# Patient Record
Sex: Male | Born: 1935
Health system: Southern US, Community
[De-identification: ages and names within clinical notes are randomized; demographics above are authoritative.]

## PROBLEM LIST (undated history)

## (undated) DIAGNOSIS — K635 Polyp of colon: Secondary | ICD-10-CM

## (undated) DIAGNOSIS — J189 Pneumonia, unspecified organism: Secondary | ICD-10-CM

## (undated) DIAGNOSIS — I2510 Atherosclerotic heart disease of native coronary artery without angina pectoris: Secondary | ICD-10-CM

## (undated) DIAGNOSIS — C44509 Unspecified malignant neoplasm of skin of other part of trunk: Secondary | ICD-10-CM

## (undated) DIAGNOSIS — I2584 Coronary atherosclerosis due to calcified coronary lesion: Secondary | ICD-10-CM

## (undated) DIAGNOSIS — I4891 Unspecified atrial fibrillation: Secondary | ICD-10-CM

## (undated) DIAGNOSIS — M1990 Unspecified osteoarthritis, unspecified site: Secondary | ICD-10-CM

## (undated) DIAGNOSIS — I6529 Occlusion and stenosis of unspecified carotid artery: Secondary | ICD-10-CM

## (undated) DIAGNOSIS — E785 Hyperlipidemia, unspecified: Secondary | ICD-10-CM

## (undated) HISTORY — PX: COLONOSCOPY: SHX174

## (undated) HISTORY — PX: MOLE REMOVAL: SHX2046

## (undated) HISTORY — PX: PILONIDAL CYST DRAINAGE: SHX743

## (undated) HISTORY — DX: Coronary atherosclerosis due to calcified coronary lesion: I25.84

## (undated) HISTORY — DX: Occlusion and stenosis of unspecified carotid artery: I65.29

## (undated) HISTORY — PX: SHOULDER OPEN ROTATOR CUFF REPAIR: SHX2407

## (undated) HISTORY — PX: SKIN CANCER EXCISION: SHX779

## (undated) HISTORY — DX: Atherosclerotic heart disease of native coronary artery without angina pectoris: I25.10

## (undated) HISTORY — DX: Unspecified atrial fibrillation: I48.91

## (undated) HISTORY — DX: Hyperlipidemia, unspecified: E78.5

## (undated) HISTORY — PX: TONSILLECTOMY: SUR1361

## (undated) HISTORY — DX: Polyp of colon: K63.5

## (undated) HISTORY — PX: INGUINAL HERNIA REPAIR: SUR1180

---

## 1998-04-14 ENCOUNTER — Ambulatory Visit (HOSPITAL_COMMUNITY): Admission: RE | Admit: 1998-04-14 | Discharge: 1998-04-14 | Payer: Self-pay | Admitting: Internal Medicine

## 1999-05-29 ENCOUNTER — Encounter: Payer: Self-pay | Admitting: Cardiology

## 1999-05-29 ENCOUNTER — Ambulatory Visit (HOSPITAL_COMMUNITY): Admission: RE | Admit: 1999-05-29 | Discharge: 1999-05-29 | Payer: Self-pay | Admitting: Cardiology

## 1999-06-09 ENCOUNTER — Ambulatory Visit (HOSPITAL_COMMUNITY): Admission: RE | Admit: 1999-06-09 | Discharge: 1999-06-09 | Payer: Self-pay | Admitting: Cardiology

## 1999-06-09 HISTORY — PX: CARDIAC CATHETERIZATION: SHX172

## 2000-03-25 ENCOUNTER — Encounter: Admission: RE | Admit: 2000-03-25 | Discharge: 2000-03-25 | Payer: Self-pay | Admitting: Orthopedic Surgery

## 2000-03-25 ENCOUNTER — Encounter: Payer: Self-pay | Admitting: Orthopedic Surgery

## 2001-07-04 ENCOUNTER — Encounter: Payer: Self-pay | Admitting: Emergency Medicine

## 2001-07-04 ENCOUNTER — Emergency Department (HOSPITAL_COMMUNITY): Admission: EM | Admit: 2001-07-04 | Discharge: 2001-07-04 | Payer: Self-pay | Admitting: Emergency Medicine

## 2003-08-07 ENCOUNTER — Encounter: Payer: Self-pay | Admitting: Orthopedic Surgery

## 2003-08-07 ENCOUNTER — Observation Stay (HOSPITAL_COMMUNITY): Admission: EM | Admit: 2003-08-07 | Discharge: 2003-08-08 | Payer: Self-pay | Admitting: Emergency Medicine

## 2005-09-24 ENCOUNTER — Ambulatory Visit: Payer: Self-pay | Admitting: Internal Medicine

## 2005-10-01 ENCOUNTER — Ambulatory Visit: Payer: Self-pay | Admitting: Internal Medicine

## 2006-03-22 ENCOUNTER — Ambulatory Visit: Payer: Self-pay | Admitting: Internal Medicine

## 2007-05-26 ENCOUNTER — Ambulatory Visit: Payer: Self-pay | Admitting: Internal Medicine

## 2007-05-26 LAB — CONVERTED CEMR LAB
ALT: 21 units/L (ref 0–40)
AST: 23 units/L (ref 0–37)
Albumin: 3.9 g/dL (ref 3.5–5.2)
Alkaline Phosphatase: 50 units/L (ref 39–117)
BUN: 12 mg/dL (ref 6–23)
Basophils Absolute: 0 10*3/uL (ref 0.0–0.1)
Basophils Relative: 0.3 % (ref 0.0–1.0)
Bilirubin, Direct: 0.1 mg/dL (ref 0.0–0.3)
CO2: 32 meq/L (ref 19–32)
Calcium: 8.9 mg/dL (ref 8.4–10.5)
Chloride: 111 meq/L (ref 96–112)
Cholesterol: 229 mg/dL (ref 0–200)
Creatinine, Ser: 0.9 mg/dL (ref 0.4–1.5)
Direct LDL: 156.9 mg/dL
Eosinophils Absolute: 0.2 10*3/uL (ref 0.0–0.6)
Eosinophils Relative: 2.7 % (ref 0.0–5.0)
GFR calc Af Amer: 106 mL/min
GFR calc non Af Amer: 87 mL/min
Glucose, Bld: 103 mg/dL — ABNORMAL HIGH (ref 70–99)
HCT: 44.9 % (ref 39.0–52.0)
HDL: 37.1 mg/dL — ABNORMAL LOW (ref >39.0)
Hemoglobin: 14.9 g/dL (ref 13.0–17.0)
Lymphocytes Relative: 33.1 % (ref 12.0–46.0)
MCHC: 33.2 g/dL (ref 30.0–36.0)
MCV: 92.7 fL (ref 78.0–100.0)
Monocytes Absolute: 0.5 10*3/uL (ref 0.2–0.7)
Monocytes Relative: 8.3 % (ref 3.0–11.0)
Neutro Abs: 3.6 10*3/uL (ref 1.4–7.7)
Neutrophils Relative %: 55.6 % (ref 43.0–77.0)
PSA: 0.34 ng/mL (ref 0.10–4.00)
Platelets: 218 10*3/uL (ref 150–400)
Potassium: 4.2 meq/L (ref 3.5–5.1)
RBC: 4.84 M/uL (ref 4.22–5.81)
RDW: 13.2 % (ref 11.5–14.6)
Sodium: 144 meq/L (ref 135–145)
TSH: 1.5 microintl units/mL (ref 0.35–5.50)
Total Bilirubin: 0.9 mg/dL (ref 0.3–1.2)
Total CHOL/HDL Ratio: 6.2
Total Protein: 7.4 g/dL (ref 6.0–8.3)
Triglycerides: 116 mg/dL (ref 0–149)
VLDL: 23 mg/dL (ref 0–40)
WBC: 6.4 10*3/uL (ref 4.5–10.5)

## 2007-06-02 ENCOUNTER — Ambulatory Visit: Payer: Self-pay | Admitting: Internal Medicine

## 2007-06-03 DIAGNOSIS — Z860100 Personal history of colon polyps, unspecified: Secondary | ICD-10-CM | POA: Insufficient documentation

## 2007-10-24 ENCOUNTER — Telehealth: Payer: Self-pay | Admitting: Internal Medicine

## 2007-10-29 ENCOUNTER — Ambulatory Visit: Payer: Self-pay | Admitting: Internal Medicine

## 2007-11-05 ENCOUNTER — Telehealth: Payer: Self-pay | Admitting: *Deleted

## 2007-11-11 ENCOUNTER — Telehealth: Payer: Self-pay | Admitting: Internal Medicine

## 2007-11-11 LAB — CONVERTED CEMR LAB
Cholesterol: 240 mg/dL (ref 0–200)
Direct LDL: 180.5 mg/dL
HDL: 36.4 mg/dL — ABNORMAL LOW (ref >39.0)
Total CHOL/HDL Ratio: 6.6
Triglycerides: 75 mg/dL (ref 0–149)
VLDL: 15 mg/dL (ref 0–40)

## 2008-08-05 ENCOUNTER — Ambulatory Visit: Payer: Self-pay | Admitting: Internal Medicine

## 2008-08-05 LAB — CONVERTED CEMR LAB
ALT: 18 units/L (ref 0–53)
AST: 24 units/L (ref 0–37)
Albumin: 3.9 g/dL (ref 3.5–5.2)
Alkaline Phosphatase: 51 units/L (ref 39–117)
BUN: 13 mg/dL (ref 6–23)
Basophils Absolute: 0 10*3/uL (ref 0.0–0.1)
Basophils Relative: 0.2 % (ref 0.0–3.0)
Bilirubin Urine: NEGATIVE
Bilirubin, Direct: 0.1 mg/dL (ref 0.0–0.3)
Blood in Urine, dipstick: NEGATIVE
CO2: 30 meq/L (ref 19–32)
Calcium: 9.3 mg/dL (ref 8.4–10.5)
Chloride: 106 meq/L (ref 96–112)
Cholesterol: 178 mg/dL (ref 0–200)
Creatinine, Ser: 1 mg/dL (ref 0.4–1.5)
Eosinophils Absolute: 0.2 10*3/uL (ref 0.0–0.7)
Eosinophils Relative: 3.1 % (ref 0.0–5.0)
GFR calc Af Amer: 93 mL/min
GFR calc non Af Amer: 77 mL/min
Glucose, Bld: 104 mg/dL — ABNORMAL HIGH (ref 70–99)
Glucose, Urine, Semiquant: NEGATIVE
HCT: 44.1 % (ref 39.0–52.0)
HDL: 34.9 mg/dL — ABNORMAL LOW (ref >39.0)
Hemoglobin: 15.3 g/dL (ref 13.0–17.0)
Ketones, urine, test strip: NEGATIVE
LDL Cholesterol: 129 mg/dL — ABNORMAL HIGH (ref 0–99)
Lymphocytes Relative: 30.5 % (ref 12.0–46.0)
MCHC: 34.7 g/dL (ref 30.0–36.0)
MCV: 92.5 fL (ref 78.0–100.0)
Monocytes Absolute: 0.5 10*3/uL (ref 0.1–1.0)
Monocytes Relative: 9.9 % (ref 3.0–12.0)
Neutro Abs: 3.1 10*3/uL (ref 1.4–7.7)
Neutrophils Relative %: 56.3 % (ref 43.0–77.0)
Nitrite: NEGATIVE
PSA: 0.4 ng/mL (ref 0.10–4.00)
Platelets: 203 10*3/uL (ref 150–400)
Potassium: 4.8 meq/L (ref 3.5–5.1)
Protein, U semiquant: NEGATIVE
RBC: 4.77 M/uL (ref 4.22–5.81)
RDW: 12.5 % (ref 11.5–14.6)
Sodium: 142 meq/L (ref 135–145)
Specific Gravity, Urine: 1.015
TSH: 1.25 microintl units/mL (ref 0.35–5.50)
Total Bilirubin: 1 mg/dL (ref 0.3–1.2)
Total CHOL/HDL Ratio: 5.1
Total Protein: 7.5 g/dL (ref 6.0–8.3)
Triglycerides: 73 mg/dL (ref 0–149)
Urobilinogen, UA: 0.2
VLDL: 15 mg/dL (ref 0–40)
WBC Urine, dipstick: NEGATIVE
WBC: 5.4 10*3/uL (ref 4.5–10.5)
pH: 6.5

## 2008-08-12 ENCOUNTER — Ambulatory Visit: Payer: Self-pay | Admitting: Internal Medicine

## 2008-08-12 DIAGNOSIS — E785 Hyperlipidemia, unspecified: Secondary | ICD-10-CM | POA: Insufficient documentation

## 2008-10-04 ENCOUNTER — Telehealth (INDEPENDENT_AMBULATORY_CARE_PROVIDER_SITE_OTHER): Payer: Self-pay | Admitting: *Deleted

## 2008-10-21 ENCOUNTER — Ambulatory Visit: Payer: Self-pay | Admitting: Internal Medicine

## 2008-10-21 LAB — CONVERTED CEMR LAB: Rapid Strep: NEGATIVE

## 2008-12-10 ENCOUNTER — Telehealth: Payer: Self-pay | Admitting: Internal Medicine

## 2008-12-17 ENCOUNTER — Encounter: Payer: Self-pay | Admitting: Internal Medicine

## 2008-12-17 ENCOUNTER — Ambulatory Visit: Payer: Self-pay

## 2009-08-18 ENCOUNTER — Encounter (INDEPENDENT_AMBULATORY_CARE_PROVIDER_SITE_OTHER): Payer: Self-pay | Admitting: *Deleted

## 2009-10-03 ENCOUNTER — Telehealth: Payer: Self-pay | Admitting: Internal Medicine

## 2009-10-05 ENCOUNTER — Ambulatory Visit: Payer: Self-pay | Admitting: Internal Medicine

## 2009-10-05 LAB — CONVERTED CEMR LAB
ALT: 19 units/L (ref 0–53)
Basophils Relative: 0.5 % (ref 0.0–3.0)
CRP, High Sensitivity: 10.3 — ABNORMAL HIGH (ref 0.00–5.00)
Chloride: 103 meq/L (ref 96–112)
Eosinophils Relative: 3.1 % (ref 0.0–5.0)
Glucose, Urine, Semiquant: NEGATIVE
HCT: 43.8 % (ref 39.0–52.0)
Hemoglobin: 15.1 g/dL (ref 13.0–17.0)
Lymphs Abs: 1.3 10*3/uL (ref 0.7–4.0)
MCV: 95.2 fL (ref 78.0–100.0)
Monocytes Absolute: 0.4 10*3/uL (ref 0.1–1.0)
Potassium: 4.9 meq/L (ref 3.5–5.1)
Protein, U semiquant: NEGATIVE
RBC: 4.6 M/uL (ref 4.22–5.81)
Sodium: 140 meq/L (ref 135–145)
TSH: 1.23 microintl units/mL (ref 0.35–5.50)
Total CHOL/HDL Ratio: 5
Total Protein: 7.2 g/dL (ref 6.0–8.3)
Urobilinogen, UA: 0.2
VLDL: 13.8 mg/dL (ref 0.0–40.0)
WBC Urine, dipstick: NEGATIVE
WBC: 5.1 10*3/uL (ref 4.5–10.5)
pH: 6.5

## 2009-10-31 ENCOUNTER — Ambulatory Visit: Payer: Self-pay | Admitting: Internal Medicine

## 2009-11-18 ENCOUNTER — Encounter: Payer: Self-pay | Admitting: Internal Medicine

## 2009-12-26 ENCOUNTER — Ambulatory Visit: Payer: Self-pay

## 2009-12-26 ENCOUNTER — Encounter: Payer: Self-pay | Admitting: Internal Medicine

## 2009-12-26 ENCOUNTER — Encounter: Payer: Self-pay | Admitting: Cardiovascular Disease

## 2009-12-26 DIAGNOSIS — I739 Peripheral vascular disease, unspecified: Secondary | ICD-10-CM

## 2009-12-26 DIAGNOSIS — I779 Disorder of arteries and arterioles, unspecified: Secondary | ICD-10-CM | POA: Insufficient documentation

## 2010-03-27 ENCOUNTER — Encounter: Payer: Self-pay | Admitting: Internal Medicine

## 2010-05-03 ENCOUNTER — Ambulatory Visit: Payer: Self-pay | Admitting: Internal Medicine

## 2010-05-12 ENCOUNTER — Encounter: Payer: Self-pay | Admitting: Internal Medicine

## 2010-09-05 ENCOUNTER — Telehealth: Payer: Self-pay | Admitting: Internal Medicine

## 2010-09-08 ENCOUNTER — Encounter: Payer: Self-pay | Admitting: Internal Medicine

## 2010-10-30 ENCOUNTER — Ambulatory Visit: Payer: Self-pay | Admitting: Internal Medicine

## 2010-10-30 LAB — CONVERTED CEMR LAB
Albumin: 4.1 g/dL (ref 3.5–5.2)
BUN: 14 mg/dL (ref 6–23)
Basophils Relative: 0.3 % (ref 0.0–3.0)
Calcium: 9.4 mg/dL (ref 8.4–10.5)
Creatinine, Ser: 0.9 mg/dL (ref 0.4–1.5)
Direct LDL: 133.9 mg/dL
Eosinophils Absolute: 0.3 10*3/uL (ref 0.0–0.7)
GFR calc non Af Amer: 87.67 mL/min (ref >60)
Glucose, Urine, Semiquant: NEGATIVE
HDL: 47.1 mg/dL (ref >39.00)
Hemoglobin: 14.7 g/dL (ref 13.0–17.0)
Ketones, urine, test strip: NEGATIVE
Lymphs Abs: 2.1 10*3/uL (ref 0.7–4.0)
MCHC: 34.3 g/dL (ref 30.0–36.0)
MCV: 94.3 fL (ref 78.0–100.0)
Monocytes Absolute: 0.6 10*3/uL (ref 0.1–1.0)
Neutro Abs: 3.1 10*3/uL (ref 1.4–7.7)
PSA: 0.47 ng/mL (ref 0.10–4.00)
Potassium: 4.5 meq/L (ref 3.5–5.1)
Protein, U semiquant: NEGATIVE
RBC: 4.54 M/uL (ref 4.22–5.81)
TSH: 1.42 microintl units/mL (ref 0.35–5.50)
Total Protein: 7.2 g/dL (ref 6.0–8.3)
Triglycerides: 86 mg/dL (ref 0.0–149.0)
VLDL: 17.2 mg/dL (ref 0.0–40.0)
WBC Urine, dipstick: NEGATIVE
pH: 7

## 2010-11-03 ENCOUNTER — Ambulatory Visit: Payer: Self-pay | Admitting: Internal Medicine

## 2010-11-03 DIAGNOSIS — I2510 Atherosclerotic heart disease of native coronary artery without angina pectoris: Secondary | ICD-10-CM | POA: Insufficient documentation

## 2011-01-01 ENCOUNTER — Ambulatory Visit: Admit: 2011-01-01 | Payer: Self-pay

## 2011-01-02 NOTE — Progress Notes (Signed)
Summary: Pt is req that Dr Cato Mulligan sch pt an appt with ear spec on Friday  Phone Note Call from Patient Call back at [phone] cell   Caller: Patient Summary of Call: Pt called and is out of town. Pt says that he is having problems with his ear and is req that Dr. Cato Mulligan refers pt to a spec re: pts ear. Pt is req that Dr Cato Mulligan sch the pt an appt to see ear spec on Friday 09/08/10. Pt will be back in town on thursday night 09/07/10, so anytime on Friday is fine.   Initial call taken by: Lucy Antigua,  September 05, 2010 9:53 AM  Follow-up for Phone Call        ok Follow-up by: Birdie Sons MD,  September 05, 2010 3:06 PM  New Problems: EAR PAIN (ICD-388.70)   New Problems: EAR PAIN (ICD-388.70)

## 2011-01-02 NOTE — Miscellaneous (Signed)
Summary: Orders Update  Clinical Lists Changes  Problems: Added new problem of CAROTID ARTERY DISEASE (ICD-433.10) Orders: Added new Test order of Carotid Duplex (Carotid Duplex) - Signed 

## 2011-01-02 NOTE — Letter (Signed)
Summary: [facility]   [facility]   Imported By: Maryln Gottron 05/29/2010 15:01:51  _____________________________________________________________________  External Attachment:    Type:   Image     Comment:   External Document

## 2011-01-02 NOTE — Miscellaneous (Signed)
Summary: Orders Update  Clinical Lists Changes  Problems: Added new problem of ABDOMINAL BRUIT (ICD-785.9) Orders: Added new Test order of Abdominal Aorta Duplex (Abd Aorta Duplex) - Signed 

## 2011-01-02 NOTE — Assessment & Plan Note (Signed)
Summary: ?SINUS INF/BACTERIA/HEADACHE/CJR   Vital Signs:  Patient profile:   77 year old male Temp:     98.7 degrees F oral Pulse rate:   58 / minute Pulse rhythm:   regular Resp:     12 per minute BP sitting:   120 / 68  (left arm) Cuff size:   regular  Vitals Entered By: Gladis Riffle, RN (May 03, 2010 11:15 AM) CC: c/o nasal congestion and left sided headache when at beach last week, has resolved Is Patient Diabetic? No   CC:  c/o nasal congestion and left sided headache when at beach last week and has resolved.  History of Present Illness: Left sided sinus sxs started 5 days ago with left sided nasal pain developed a left sided headache no nasal congestion no fever or chills He is feeling much better the past 2 days. headache---relieved with aspirin.   All other systems reviewed and were negative   Preventive Screening-Counseling & Management  Alcohol-Tobacco     Smoking Status: quit > 6 months     Year Started: 1954     Year Quit: 1974  Current Problems (verified): 1)  Carotid Artery Disease  (ICD-433.10) 2)  Abdominal Bruit  (ICD-785.9) 3)  Chest Pain  (ICD-786.50) 4)  Preventive Health Care  (ICD-V70.0) 5)  Hyperlipidemia  (ICD-272.4) 6)  Coronary Artery Disease  (ICD-414.00) 7)  Colonic Polyps, Hx of  (ICD-V12.72)  Current Medications (verified): 1)  Pravachol 40 Mg Tabs (Pravastatin Sodium) .... At Bedtime As Directed 2)  Multivitamins  Tabs (Multiple Vitamin) .... Once Daily 3)  Fish Oil 1000 Mg Caps (Omega-3 Fatty Acids) .... Once Daily 4)  Co Q-10 100 Mg Caps (Coenzyme Q10) .... 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   Head:  normocephalic and atraumatic.   Eyes:  pupils equal, pupils round, no optic disk abnormalities, and no retinal abnormalitiies.   Nose:  External nasal examination shows no deformity or inflammation. Nasal mucosa are pink and moist without lesions or exudates. Neck:  No deformities, masses, or tenderness noted. Chest Wall:  No deformities, masses, tenderness or gynecomastia noted. Lungs:  Normal respiratory effort, chest expands symmetrically. Lungs are clear to auscultation, no crackles or wheezes. Heart:  Normal rate and regular rhythm. S1 and S2 normal without gallop, murmur, click, rub or other extra sounds.   Impression & Recommendations:  Problem # 1:  URI (ICD-465.9) I think sxs resolving no evidence of bacterial infection. call for any concerns, increased sxs, fever, persistence of sxs, wheeze, SOB.   no Rx  Complete Medication List: 1)  Pravachol 40 Mg Tabs (Pravastatin sodium) .... At bedtime as directed 2)  Multivitamins Tabs (Multiple vitamin) .... Once daily 3)  Fish Oil 1000 Mg Caps (Omega-3 fatty acids) .... Once daily 4)  Co Q-10 100 Mg Caps (Coenzyme q10) .... Once daily

## 2011-01-02 NOTE — Assessment & Plan Note (Signed)
Summary: cpx/pt stated he will pay for cpx/njr   Vital Signs:  Patient profile:   77 year old male Height:      70.5 inches Weight:      192.5 pounds BMI:     27.33 Temp:     97.9 degrees F oral Pulse rate:   76 / minute Pulse rhythm:   regular BP sitting:   132 / 80  (left arm) Cuff size:   large  Vitals Entered By: Alfred Levins, CMA (November 03, 2010 9:07 AM)  Nutrition Counseling: Patient's BMI is greater than 25 and therefore counseled on weight management options. CC: cpx   CC:  cpx.  History of Present Illness: Here for Medicare AWV:  1.   Risk factors based on Past M, S, F history: 2.   Physical Activities:  3.   Depression/mood:  4.   Hearing:  5.   ADL's:  6.   Fall Risk:  7.   Home Safety:  8.   Height, weight, &visual acuity: 9.   Counseling:  10.   Labs ordered based on risk factors:  11.           Referral Coordination 12.           Care Plan 13.            Cognitive Assessment   Current Problems:  CAROTID ARTERY DISEASE (ICD-433.10)--no sxs HYPERLIPIDEMIA (ICD-272.4)---no sxs on meds CORONARY ARTERY DISEASE (ICD-414.00)--no CP, SOB, PND  All other systems reviewed and were negative     Current Problems (verified): 1)  Carotid Artery Disease  (ICD-433.10) 2)  Chest Pain  (ICD-786.50) 3)  Preventive Health Care  (ICD-V70.0) 4)  Hyperlipidemia  (ICD-272.4) 5)  Coronary Artery Disease  (ICD-414.00) 6)  Colonic Polyps, Hx of  (ICD-V12.72)  Current Medications (verified): 1)  Pravachol 40 Mg Tabs (Pravastatin Sodium) .... At Bedtime As Directed 2)  Multivitamins  Tabs (Multiple Vitamin) .... Once Daily 3)  Fish Oil 1000 Mg Caps (Omega-3 Fatty Acids) .... Once Daily 4)  Co Q-10 100 Mg Caps (Coenzyme Q10) .... Once Daily 5)  Magnesium 500 Mg Tabs (Magnesium) .... Qd 6)  Vitamin D 2000 Unit Tabs (Cholecalciferol) .... 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 He looks markedly younger than his stated age. HEENT exam atraumatic, normocephalic, neck supple. Chest clear auscultation. Cardiac exam S1-S2 are regular abdominal exam active bowel sounds, soft and nontender. Extremities no clubbing cyanosis or edema. Neurologic exam is alert and oriented without any motor or sensory deficits.   Impression & Recommendations:  Problem # 1:  PREVENTIVE HEALTH CARE (ICD-V70.0)  health maint utd  Orders: Medicare -1st Annual Wellness Visit [phone])  Problem # 2:  CAROTID ARTERY DISEASE (ICD-433.10) followed by Dr. Clarene Duke  Problem # 3:  CORONARY ARTERY DISEASE (ICD-414.00) no symptoms. I discussed with him that he is not at goal lipid management. He refuses take other medications. He is doing remarkably well. He exercises frequent.  Problem # 4:  HYPERLIPIDEMIA (ICD-272.4) continue current medications. His updated medication  list for this problem includes:    Pravachol 40 Mg Tabs (Pravastatin sodium) .Marland Kitchen... At bedtime as directed  Complete Medication List: 1)  Pravachol 40 Mg Tabs (Pravastatin sodium) .... At bedtime as directed 2)  Multivitamins Tabs (Multiple vitamin) .... Once daily 3)  Fish Oil 1000 Mg Caps (Omega-3 fatty acids) .... Once daily 4)  Co Q-10 100 Mg Caps (Coenzyme q10) .... Once daily 5)  Magnesium 500 Mg Tabs (Magnesium) .... Qd 6)  Vitamin D 2000 Unit Tabs (Cholecalciferol) .... Once daily  Contraindications/Deferment of Procedures/Staging:     Test/Procedure: FLU VAX    Reason for deferment: patient declined     Test/Procedure: Zoster vaccine    Reason for deferment: declined    Orders Added: 1)  Medicare -1st Annual Wellness Visit [G0438] 2)  Est. Patient Level III [75643]

## 2011-01-05 NOTE — Consult Note (Signed)
Summary: ENT-Dr. Narda Bonds  ENT-Dr. Narda Bonds   Imported By: Maryln Gottron 10/02/2010 11:09:10  _____________________________________________________________________  External Attachment:    Type:   Image     Comment:   External Document

## 2011-01-08 ENCOUNTER — Encounter (INDEPENDENT_AMBULATORY_CARE_PROVIDER_SITE_OTHER): Payer: Medicare Other

## 2011-01-08 ENCOUNTER — Encounter: Payer: Self-pay | Admitting: Internal Medicine

## 2011-01-08 DIAGNOSIS — I6529 Occlusion and stenosis of unspecified carotid artery: Secondary | ICD-10-CM

## 2011-01-18 NOTE — Miscellaneous (Signed)
Summary: Orders Update  Clinical Lists Changes  Orders: Added new Test order of Carotid Duplex (Carotid Duplex) - Signed 

## 2011-04-03 HISTORY — PX: NM MYOCAR PERF WALL MOTION: HXRAD629

## 2011-04-19 ENCOUNTER — Other Ambulatory Visit: Payer: Self-pay | Admitting: Orthopedic Surgery

## 2011-04-19 DIAGNOSIS — M25512 Pain in left shoulder: Secondary | ICD-10-CM

## 2011-04-20 NOTE — Op Note (Signed)
   NAME:  Sean Miranda, Sean Miranda                         ACCOUNT NO.:  [id number]   MEDICAL RECORD NO.:  [id number]                   PATIENT TYPE:  OBV   LOCATION:  2550                                 FACILITY:  [facility]   PHYSICIAN:  Mila Homer. Sherlean Foot, M.D.              DATE OF BIRTH:  25-May-1930   DATE OF PROCEDURE:  09/06/2003  DATE OF DISCHARGE:  08/08/2003                                 OPERATIVE REPORT   PREOPERATIVE DIAGNOSIS:  Right ankle laceration with Achilles tendon  laceration.   POSTOPERATIVE DIAGNOSIS:  Right ankle laceration with Achilles tendon  laceration.   PROCEDURE:  Right ankle Achilles tendon repair and irrigation and  debridement.   INDICATIONS FOR PROCEDURE:  The patient is a 77 year old white male who  slipped going  down a hill. He hit his ankle on a sharp piece of metal. He  had a relatively clean laceration with an obvious complete Achilles rupture.  Informed consent was obtained.   ANESTHESIA:  General.   SURGEON:  Mila Homer. Sherlean Foot, M.D.   ASSISTANT:  None.   DESCRIPTION OF PROCEDURE:  The patient was taken to the operating room and  laid in the supine position and administered general anesthesia. The right  lower extremity was prepped and draped in the usual sterile fashion. We then  placed him in a figure-of-4 position to get access to the posterior aspect  of the ankle.   I then extended the incision on the lateral side going proximal and the  medial side going distal approximately 3 cm each, creating nice flaps to  expose the cut surface of the Achilles tendon. I then irrigated with 3000 mL  of normal saline via the pulse lavage system.   I then placed Krakow sutures in the proximal and distal portions of the  Achilles tendon. I then plantar flexed the foot, had my assistant hold that  in plantar flexion and tied down the sutures. I then used 0 fiberwire to  circumferentially run around the Achilles tendon as well. I then closed the  paratenon  with 0 Vicryl sutures, the deep soft tissues with 0 Vicryl sutures  and then subcuticular 2-0 Vicryl sutures and Steri-Strips. I kept the foot  in plantar flexion, I dressed with Xeroform dressing sponges, a bulky wrap  and a posterior splint. I let the tourniquet down.   There were no complications and no drains placed. Estimated blood loss was  minimal. Tourniquet time was 25 minutes.                                               Mila Homer. Sherlean Foot, M.D.    SDL/MEDQ  D:  09/06/2003  T:  09/06/2003  Job:  [id number]

## 2011-04-27 ENCOUNTER — Ambulatory Visit
Admission: RE | Admit: 2011-04-27 | Discharge: 2011-04-27 | Disposition: A | Discharge status: Home / Self Care | Payer: Medicare Other | Source: Ambulatory Visit | Attending: Orthopedic Surgery | Admitting: Orthopedic Surgery

## 2011-04-27 DIAGNOSIS — M25512 Pain in left shoulder: Secondary | ICD-10-CM

## 2011-07-26 ENCOUNTER — Telehealth: Payer: Self-pay | Admitting: Internal Medicine

## 2011-07-26 NOTE — Telephone Encounter (Signed)
Please advise 

## 2011-07-26 NOTE — Telephone Encounter (Signed)
Pt. walked in & stated he wanted labs done. He specifically wants Cholesterol,Hemocystine,C-Reactive Protein & Testosterone Level checked. I told him it looks like it has been a while since he has seen Dr. Cato Mulligan would he like to schedule an appt. He said no he just wants the labs done then they can call him with the results. Please call [phone] and let him know if this is o.k.

## 2011-08-07 NOTE — Telephone Encounter (Signed)
Lipid and liver ok No need to check testosterone---we would not treat anyway based on hx of prostate CA

## 2011-08-07 NOTE — Telephone Encounter (Signed)
Pt called back to check on status of getting labs ordered. Pls call asap.

## 2011-08-08 NOTE — Telephone Encounter (Signed)
LMTCB

## 2011-08-09 NOTE — Telephone Encounter (Signed)
Pt wants testosterone level checked for piece of mind.  He said if insurance does not cover it he will pay for it.

## 2011-08-09 NOTE — Telephone Encounter (Signed)
ok 

## 2011-08-13 ENCOUNTER — Other Ambulatory Visit (INDEPENDENT_AMBULATORY_CARE_PROVIDER_SITE_OTHER): Payer: Medicare Other

## 2011-08-13 DIAGNOSIS — E785 Hyperlipidemia, unspecified: Secondary | ICD-10-CM

## 2011-08-13 LAB — LIPID PANEL
HDL: 49.3 mg/dL (ref >39.00)
Total CHOL/HDL Ratio: 5
VLDL: 13.6 mg/dL (ref 0.0–40.0)

## 2011-08-13 LAB — HEPATIC FUNCTION PANEL
Bilirubin, Direct: 0.1 mg/dL (ref 0.0–0.3)
Total Bilirubin: 0.7 mg/dL (ref 0.3–1.2)
Total Protein: 7.3 g/dL (ref 6.0–8.3)

## 2011-08-14 LAB — LDL CHOLESTEROL, DIRECT: Direct LDL: 171 mg/dL

## 2011-08-25 ENCOUNTER — Other Ambulatory Visit: Payer: Self-pay | Admitting: Internal Medicine

## 2011-08-25 DIAGNOSIS — D229 Melanocytic nevi, unspecified: Secondary | ICD-10-CM

## 2011-12-07 ENCOUNTER — Other Ambulatory Visit (INDEPENDENT_AMBULATORY_CARE_PROVIDER_SITE_OTHER): Payer: Medicare Other

## 2011-12-07 DIAGNOSIS — E785 Hyperlipidemia, unspecified: Secondary | ICD-10-CM

## 2011-12-07 DIAGNOSIS — Z0000 Encounter for general adult medical examination without abnormal findings: Secondary | ICD-10-CM

## 2011-12-07 DIAGNOSIS — Z125 Encounter for screening for malignant neoplasm of prostate: Secondary | ICD-10-CM

## 2011-12-07 LAB — CBC WITH DIFFERENTIAL/PLATELET
Basophils Relative: 0.2 % (ref 0.0–3.0)
Eosinophils Relative: 3.3 % (ref 0.0–5.0)
HCT: 43.5 % (ref 39.0–52.0)
Hemoglobin: 14.8 g/dL (ref 13.0–17.0)
Lymphs Abs: 1.9 10*3/uL (ref 0.7–4.0)
Monocytes Relative: 10.4 % (ref 3.0–12.0)
Platelets: 202 10*3/uL (ref 150.0–400.0)
RBC: 4.55 Mil/uL (ref 4.22–5.81)
WBC: 6.7 10*3/uL (ref 4.5–10.5)

## 2011-12-07 LAB — POCT URINALYSIS DIPSTICK
Bilirubin, UA: NEGATIVE
Blood, UA: NEGATIVE
Nitrite, UA: NEGATIVE
Spec Grav, UA: 1.015
pH, UA: 7.5

## 2011-12-07 LAB — HEPATIC FUNCTION PANEL
Albumin: 4.1 g/dL (ref 3.5–5.2)
Alkaline Phosphatase: 58 U/L (ref 39–117)
Bilirubin, Direct: 0.1 mg/dL (ref 0.0–0.3)

## 2011-12-07 LAB — BASIC METABOLIC PANEL
Calcium: 9.1 mg/dL (ref 8.4–10.5)
Creatinine, Ser: 1 mg/dL (ref 0.4–1.5)
GFR: 75.55 mL/min (ref >60.00)
Sodium: 138 mEq/L (ref 135–145)

## 2011-12-07 LAB — PSA: PSA: 0.4 ng/mL (ref 0.10–4.00)

## 2011-12-07 LAB — LIPID PANEL
Cholesterol: 214 mg/dL — ABNORMAL HIGH (ref 0–200)
HDL: 51.6 mg/dL (ref >39.00)
Triglycerides: 64 mg/dL (ref 0.0–149.0)

## 2011-12-17 ENCOUNTER — Ambulatory Visit (INDEPENDENT_AMBULATORY_CARE_PROVIDER_SITE_OTHER): Payer: Medicare Other | Admitting: Internal Medicine

## 2011-12-17 ENCOUNTER — Encounter: Payer: Self-pay | Admitting: Internal Medicine

## 2011-12-17 VITALS — BP 144/78 | HR 80 | Temp 97.6°F | Ht 71.0 in | Wt 190.0 lb

## 2011-12-17 DIAGNOSIS — Z0000 Encounter for general adult medical examination without abnormal findings: Secondary | ICD-10-CM

## 2011-12-17 MED ORDER — ASPIRIN 81 MG PO CHEW: 81.0000 mg | CHEWABLE_TABLET | Freq: Every day | ORAL | Status: AC

## 2011-12-17 NOTE — Progress Notes (Signed)
Subjective:    Sean Miranda is a 78 y.o. male who presents for Medicare Annual/Subsequent preventive examination.   Preventive Screening-Counseling & Management  Tobacco History  Smoking status  . Former Smoker  . Types: Cigarettes  . Quit date: 12/03/1970  Smokeless tobacco  . Not on file    Problems Prior to Visit 1.   Current Problems (verified) Patient Active Problem List  Diagnoses  . HYPERLIPIDEMIA  . CORONARY ARTERY DISEASE  . CAROTID ARTERY DISEASE  . COLONIC POLYPS, HX OF    Medications Prior to Visit Current Outpatient Prescriptions on File Prior to Visit  Medication Sig Dispense Refill  . pravastatin (PRAVACHOL) 40 MG tablet Take 40 mg by mouth daily.          Current Medications (verified) Current Outpatient Prescriptions  Medication Sig Dispense Refill  . co-enzyme Q-10 30 MG capsule Take 30 mg by mouth 3 (three) times daily.      . Ergocalciferol (VITAMIN D2) 2000 UNITS TABS Take by mouth.      . fish oil-omega-3 fatty acids 1000 MG capsule Take 2 g by mouth daily.      . Magnesium 500 MG CAPS Take 1 capsule by mouth daily.      . Multiple Vitamin (MULTIVITAMIN) tablet Take 1 tablet by mouth daily.      . pravastatin (PRAVACHOL) 40 MG tablet Take 40 mg by mouth daily.           Allergies (verified) Review of patient's allergies indicates no known allergies.   PAST HISTORY  Family History Family History  Problem Relation Age of Onset  . Cancer Son     prostate  . Stroke Father     Social History History  Substance Use Topics  . Smoking status: Former Smoker    Types: Cigarettes    Quit date: 12/03/1970  . Smokeless tobacco: Not on file  . Alcohol Use: Yes    Are there smokers in your home (other than you)?  No  Risk Factors Current exercise habits: pt exercises regularly Dietary issues discussed: yes, follows a generally healthy diet   Cardiac risk factors: advanced age (older than 1 for men, 56 for women) and  dyslipidemia.  Depression Screen (Note: if answer to either of the following is "Yes", a more complete depression screening is indicated)   Q1: Over the past two weeks, have you felt down, depressed or hopeless? No  Q2: Over the past two weeks, have you felt little interest or pleasure in doing things? No   Activities of Daily Living In your present state of health, do you have any difficulty performing the following activities?:  Driving? No Managing money?  No Feeding yourself? No  Hearing Difficulties: No Do you often ask people to speak up or repeat themselves? No    Do you feel that you have a problem with memory? No   Cognitive Testing  Alert? Yes  Normal Appearance?Yes  Oriented to person? Yes  Place? Yes     Displays appropriate judgment?Yes    List the Names of Other Physician/Practitioners you currently use: 1.    Indicate any recent Medical Services you may have received from other than [facility] providers in the past year (date may be approximate).  Immunization History  Administered Date(s) Administered  . Pneumococcal Polysaccharide 10/31/2009  . Td 10/31/2009    Screening Tests Health Maintenance  Topic Date Due  . Colonoscopy  12/25/1981  . Zostavax  12/26/1991  . Influenza Vaccine  09/03/2011  . Tetanus/tdap  11/01/2019  . Pneumococcal Polysaccharide Vaccine Age 75 And Over  Completed    All answers were reviewed with the patient and necessary referrals were made:  Judie Petit, MD   12/17/2011   History reviewed: allergies, current medications, past family history, past medical history, past social history, past surgical history and problem list  Review of Systems  patient denies chest pain, shortness of breath, orthopnea. Denies lower extremity edema, abdominal pain, change in appetite, change in bowel movements. Patient denies rashes, musculoskeletal complaints. No other specific complaints in a complete review of systems.    Objective:       Blood pressure 144/78, pulse 80, temperature 97.6 F (36.4 C), temperature source Oral, height 5\' 11"  (1.803 m), weight 190 lb (86.183 kg). Body mass index is 26.50 kg/(m^2).  Well-developed male in no acute distress. HEENT exam atraumatic, normocephalic, extraocular muscles are intact. Conjunctivae are pink without exudate. Neck is supple without lymphadenopathy, thyromegaly, jugular venous distention. Chest is clear to auscultation without increased work of breathing. Cardiac exam S1-S2 are regular. The PMI is normal. No significant murmurs or gallops. Abdominal exam active bowel sounds, soft, nontender. No abdominal bruits. Extremities no clubbing cyanosis or edema. Peripheral pulses are normal without bruits. Neurologic exam alert and oriented without any motor or sensory deficits.     Assessment:     Well Visit . Health Maint UTD     Plan:     During the course of the visit the patient was educated and counseled about appropriate screening and preventive services including:    Pneumococcal vaccine   Influenza vaccine  Td vaccine  Prostate cancer screening  Colorectal cancer screening  Diet review for nutrition referral? Yes ____  Not Indicated _x___   Patient Instructions (the written plan) was given to the patient.  Medicare Attestation I have personally reviewed: The patient's medical and social history Their use of alcohol, tobacco or illicit drugs Their current medications and supplements The patient's functional ability including ADLs,fall risks, home safety risks, cognitive, and hearing and visual impairment Diet and physical activities Evidence for depression or mood disorders  The patient's weight, height, BMI, and visual acuity have been recorded in the chart.  I have made referrals, counseling, and provided education to the patient based on review of the above and I have provided the patient with a written personalized care plan for preventive services.      Judie Petit, MD   12/17/2011

## 2011-12-17 NOTE — ED Provider Notes (Signed)
Order(s) created erroneously. Erroneous order ID: [id number] Order moved by: Durwin Nora Order move date/time: 12/17/2011  8:46 AM Source Patient:     [id number] Source Contact: 12/17/2011 Destination Patient:   [id number] Destination Contact: 10/08/2011

## 2011-12-22 ENCOUNTER — Other Ambulatory Visit: Payer: Self-pay | Admitting: Internal Medicine

## 2012-01-31 ENCOUNTER — Other Ambulatory Visit: Payer: Self-pay | Admitting: Cardiology

## 2012-01-31 DIAGNOSIS — I6529 Occlusion and stenosis of unspecified carotid artery: Secondary | ICD-10-CM

## 2012-02-01 HISTORY — PX: OTHER SURGICAL HISTORY: SHX169

## 2012-02-11 ENCOUNTER — Encounter (INDEPENDENT_AMBULATORY_CARE_PROVIDER_SITE_OTHER): Payer: Medicare Other

## 2012-02-11 DIAGNOSIS — I6529 Occlusion and stenosis of unspecified carotid artery: Secondary | ICD-10-CM

## 2012-03-10 NOTE — Progress Notes (Signed)
Change pravachol to crestor 20 mg po qd.

## 2012-04-07 ENCOUNTER — Telehealth: Payer: Self-pay | Admitting: Internal Medicine

## 2012-04-07 NOTE — Telephone Encounter (Signed)
Pt can come in either.  Lipid and LFT panel dx 272.4, nothing to eat or drink after midnight except water.  Take medicine the day of appt

## 2012-04-07 NOTE — Telephone Encounter (Signed)
Pt is requesting to have a Lipid Panel done tomorrow morning or Thursday morning. Please advise

## 2012-04-07 NOTE — Telephone Encounter (Signed)
Pt scheduled tomorrow

## 2012-04-08 ENCOUNTER — Other Ambulatory Visit (INDEPENDENT_AMBULATORY_CARE_PROVIDER_SITE_OTHER): Payer: Medicare Other

## 2012-04-08 DIAGNOSIS — E785 Hyperlipidemia, unspecified: Secondary | ICD-10-CM

## 2012-04-08 LAB — HEPATIC FUNCTION PANEL
Albumin: 4 g/dL (ref 3.5–5.2)
Total Bilirubin: 0.7 mg/dL (ref 0.3–1.2)

## 2012-04-08 LAB — LIPID PANEL
HDL: 55.1 mg/dL (ref >39.00)
LDL Cholesterol: 108 mg/dL — ABNORMAL HIGH (ref 0–99)
Total CHOL/HDL Ratio: 3
Triglycerides: 65 mg/dL (ref 0.0–149.0)

## 2012-05-07 ENCOUNTER — Ambulatory Visit (INDEPENDENT_AMBULATORY_CARE_PROVIDER_SITE_OTHER): Payer: Medicare Other | Admitting: Internal Medicine

## 2012-05-07 ENCOUNTER — Encounter: Payer: Self-pay | Admitting: Internal Medicine

## 2012-05-07 VITALS — BP 152/82 | HR 67 | Temp 97.7°F | Ht 72.0 in | Wt 190.0 lb

## 2012-05-07 DIAGNOSIS — I6529 Occlusion and stenosis of unspecified carotid artery: Secondary | ICD-10-CM

## 2012-05-07 DIAGNOSIS — I2510 Atherosclerotic heart disease of native coronary artery without angina pectoris: Secondary | ICD-10-CM

## 2012-05-07 NOTE — Assessment & Plan Note (Signed)
Reviewed ultrasound-- no sxs

## 2012-05-07 NOTE — Assessment & Plan Note (Signed)
No sxs Lipids are improved with pravastatin. LDL not at goal but will follow

## 2012-05-07 NOTE — Progress Notes (Signed)
Patient ID: Sean Miranda, male   DOB: 03-May-1930, 78 y.o.   MRN: [id number] Lipid- here for f/u Nonobstructive CAD  BP-- home bps 110s/70s-  bp compared to our cuff (accurate)  Past Medical History  Diagnosis Date  . Hyperlipidemia   . CAD (coronary artery disease)   . Colon polyps     History   Social History  . Marital Status: Married    Spouse Name: N/A    Number of Children: N/A  . Years of Education: N/A   Occupational History  . Not on file.   Social History Main Topics  . Smoking status: Former Smoker    Types: Cigarettes    Quit date: 12/03/1970  . Smokeless tobacco: Not on file  . Alcohol Use: Yes  . Drug Use: No  . Sexually Active:    Other Topics Concern  . Not on file   Social History Narrative  . No narrative on file    Past Surgical History  Procedure Date  . Inguinal hernia repair   . Rotator cuff repair   . Pilonidal cyst drainage     Family History  Problem Relation Age of Onset  . Cancer Son     prostate  . Stroke Father     Allergies  Allergen Reactions  . Lipitor (Atorvastatin Calcium)     Muscle pain    Current Outpatient Prescriptions on File Prior to Visit  Medication Sig Dispense Refill  . aspirin (ASPIRIN CHILDRENS) 81 MG chewable tablet Chew 1 tablet (81 mg total) by mouth daily.      Marland Kitchen co-enzyme Q-10 30 MG capsule Take 120 mg by mouth daily.       . Ergocalciferol (VITAMIN D2) 2000 UNITS TABS Take by mouth.      . fish oil-omega-3 fatty acids 1000 MG capsule Take 2 g by mouth daily.      . Magnesium 500 MG CAPS Take 1 capsule by mouth daily.      . Multiple Vitamin (MULTIVITAMIN) tablet Take 1 tablet by mouth daily.      Marland Kitchen PRAVACHOL 40 MG tablet TAKE (1) TABLET BY MOUTH AT BEDTIME.  30 each  5     patient denies chest pain, shortness of breath, orthopnea. Denies lower extremity edema, abdominal pain, change in appetite, change in bowel movements. Patient denies rashes, musculoskeletal complaints. No other specific  complaints in a complete review of systems.   BP 152/82  Pulse 67  Temp(Src) 97.7 F (36.5 C) (Oral)  Ht 6' (1.829 m)  Wt 190 lb (86.183 kg)  BMI 25.77 kg/m2  well-developed well-nourished male in no acute distress. HEENT exam atraumatic, normocephalic, neck supple without jugular venous distention. Chest clear to auscultation cardiac exam S1-S2 are regular. Abdominal exam overweight with bowel sounds, soft and nontender. Extremities no edema. Neurologic exam is alert with a normal gait. No carotid bruits.

## 2012-06-26 ENCOUNTER — Telehealth: Payer: Self-pay | Admitting: Internal Medicine

## 2012-06-26 DIAGNOSIS — D229 Melanocytic nevi, unspecified: Secondary | ICD-10-CM

## 2012-06-26 NOTE — Telephone Encounter (Signed)
Ok per Dr Swords, referral order entered 

## 2012-06-26 NOTE — Telephone Encounter (Signed)
Patient called stating that he would like a referral to a Dermatologist to look at a mole on his lower back. Please advise.

## 2012-07-22 ENCOUNTER — Other Ambulatory Visit: Payer: Self-pay | Admitting: Cardiology

## 2012-07-22 DIAGNOSIS — I6529 Occlusion and stenosis of unspecified carotid artery: Secondary | ICD-10-CM

## 2012-08-14 ENCOUNTER — Encounter (INDEPENDENT_AMBULATORY_CARE_PROVIDER_SITE_OTHER): Payer: BC Managed Care – PPO

## 2012-08-14 DIAGNOSIS — I6529 Occlusion and stenosis of unspecified carotid artery: Secondary | ICD-10-CM

## 2012-08-29 ENCOUNTER — Ambulatory Visit (INDEPENDENT_AMBULATORY_CARE_PROVIDER_SITE_OTHER): Payer: Medicare Other | Admitting: Internal Medicine

## 2012-08-29 DIAGNOSIS — Z23 Encounter for immunization: Secondary | ICD-10-CM

## 2012-08-29 DIAGNOSIS — E349 Endocrine disorder, unspecified: Secondary | ICD-10-CM

## 2012-08-29 DIAGNOSIS — E291 Testicular hypofunction: Secondary | ICD-10-CM

## 2012-08-29 DIAGNOSIS — Z2911 Encounter for prophylactic immunotherapy for respiratory syncytial virus (RSV): Secondary | ICD-10-CM

## 2012-08-29 LAB — TESTOSTERONE: Testosterone: 449.31 ng/dL (ref 350.00–890.00)

## 2012-08-29 NOTE — Addendum Note (Signed)
Addended by: Alfred Levins D on: 08/29/2012 10:33 AM   Modules accepted: Orders

## 2012-09-03 NOTE — Progress Notes (Signed)
No. Testosterone is normal... No need for replacement of testosterone

## 2012-09-03 NOTE — Progress Notes (Signed)
Pt infor,ed

## 2012-09-09 ENCOUNTER — Ambulatory Visit (HOSPITAL_COMMUNITY): Payer: BC Managed Care – PPO | Admitting: Oncology

## 2012-09-15 ENCOUNTER — Telehealth: Payer: Self-pay | Admitting: Internal Medicine

## 2012-09-15 NOTE — Telephone Encounter (Signed)
Pt is wanting to take testosterone gel.  Testosterone was 449

## 2012-09-15 NOTE — Telephone Encounter (Signed)
Pt requesting a call back in regard to testosterone cream

## 2012-09-16 NOTE — Telephone Encounter (Signed)
Not indicated Testosterone level is normal

## 2012-09-16 NOTE — Telephone Encounter (Signed)
Left message for pt to call back  °

## 2012-09-16 NOTE — Telephone Encounter (Signed)
Pt is insisting on getting testosterone.  He states that he gets on the treadmill and his legs become fatigued faster.

## 2012-09-17 ENCOUNTER — Telehealth: Payer: Self-pay | Admitting: Internal Medicine

## 2012-09-17 NOTE — Telephone Encounter (Signed)
Please call patient back in regards to his testosterone cream that he is currently using.  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 Thanks

## 2012-09-17 NOTE — Telephone Encounter (Signed)
No testosterone There is an off chance that pravachol is contributing to the weakness- stop pravachol for 8 weeks and then resume it

## 2012-09-17 NOTE — Telephone Encounter (Signed)
Left message for pt to call back  °

## 2012-09-18 NOTE — Telephone Encounter (Signed)
Duplicate

## 2012-09-18 NOTE — Telephone Encounter (Signed)
Pt aware.

## 2012-09-24 ENCOUNTER — Telehealth: Payer: Self-pay | Admitting: Internal Medicine

## 2012-09-24 DIAGNOSIS — E559 Vitamin D deficiency, unspecified: Secondary | ICD-10-CM

## 2012-09-24 NOTE — Telephone Encounter (Signed)
I didn't see where a Vitamin D was drawn

## 2012-09-24 NOTE — Telephone Encounter (Signed)
Patient wants to know what his Vitamin D level is and if he needs to take a supplement. Please advise.

## 2012-09-24 NOTE — Telephone Encounter (Signed)
No vitamin d done- Would advise him to take vit d 1000 IU daily

## 2012-09-25 NOTE — Telephone Encounter (Signed)
Left message for pt to call back  °

## 2012-09-25 NOTE — Telephone Encounter (Signed)
L/m on pts cell phone with Dr Swords instructions 

## 2012-10-22 ENCOUNTER — Other Ambulatory Visit (INDEPENDENT_AMBULATORY_CARE_PROVIDER_SITE_OTHER): Payer: Medicare Other

## 2012-10-22 DIAGNOSIS — E559 Vitamin D deficiency, unspecified: Secondary | ICD-10-CM

## 2012-10-22 NOTE — Telephone Encounter (Signed)
Pt is persistant that we draw a vitamin d.  Told pt he would probably have to pay out of pocket for the vitamin d and verbalizes understanding.  Lab appt scheduled

## 2012-10-22 NOTE — Addendum Note (Signed)
Addended by: Alfred Levins D on: 10/22/2012 02:47 PM   Modules accepted: Orders

## 2012-10-23 LAB — VITAMIN D 25 HYDROXY (VIT D DEFICIENCY, FRACTURES): Vit D, 25-Hydroxy: 57 ng/mL (ref 30–89)

## 2012-10-31 ENCOUNTER — Telehealth: Payer: Self-pay

## 2012-10-31 NOTE — Telephone Encounter (Signed)
Pt walked in the office and requested vitamin d level results to be printed.  Advised pt that Dr. Cato Mulligan had not viewed results but the level was 57 and within range.

## 2012-11-04 DIAGNOSIS — C499 Malignant neoplasm of connective and soft tissue, unspecified: Secondary | ICD-10-CM | POA: Insufficient documentation

## 2012-11-15 ENCOUNTER — Encounter: Payer: Self-pay | Admitting: *Deleted

## 2012-12-22 ENCOUNTER — Emergency Department (HOSPITAL_COMMUNITY)
Admission: EM | Admit: 2012-12-22 | Discharge: 2012-12-22 | Disposition: A | Discharge status: Home / Self Care | Payer: Medicare Other | Attending: Emergency Medicine | Admitting: Emergency Medicine

## 2012-12-22 ENCOUNTER — Telehealth: Payer: Self-pay | Admitting: Internal Medicine

## 2012-12-22 ENCOUNTER — Encounter (HOSPITAL_COMMUNITY): Payer: Self-pay | Admitting: *Deleted

## 2012-12-22 ENCOUNTER — Emergency Department (HOSPITAL_COMMUNITY): Payer: Medicare Other

## 2012-12-22 DIAGNOSIS — M62838 Other muscle spasm: Secondary | ICD-10-CM | POA: Insufficient documentation

## 2012-12-22 DIAGNOSIS — Z87891 Personal history of nicotine dependence: Secondary | ICD-10-CM | POA: Insufficient documentation

## 2012-12-22 DIAGNOSIS — Z860100 Personal history of colon polyps, unspecified: Secondary | ICD-10-CM | POA: Insufficient documentation

## 2012-12-22 DIAGNOSIS — I2510 Atherosclerotic heart disease of native coronary artery without angina pectoris: Secondary | ICD-10-CM | POA: Insufficient documentation

## 2012-12-22 DIAGNOSIS — Z79899 Other long term (current) drug therapy: Secondary | ICD-10-CM | POA: Insufficient documentation

## 2012-12-22 DIAGNOSIS — R209 Unspecified disturbances of skin sensation: Secondary | ICD-10-CM | POA: Insufficient documentation

## 2012-12-22 DIAGNOSIS — M5030 Other cervical disc degeneration, unspecified cervical region: Secondary | ICD-10-CM

## 2012-12-22 DIAGNOSIS — E785 Hyperlipidemia, unspecified: Secondary | ICD-10-CM | POA: Insufficient documentation

## 2012-12-22 MED ORDER — METHOCARBAMOL 500 MG PO TABS: 500.0000 mg | ORAL_TABLET | Freq: Two times a day (BID) | ORAL | Status: DC

## 2012-12-22 MED ORDER — OXYCODONE-ACETAMINOPHEN 5-325 MG PO TABS
2.0000 | ORAL_TABLET | Freq: Once | ORAL | Status: AC
  Administered 2012-12-22: 2 via ORAL
  Filled 2012-12-22: qty 2

## 2012-12-22 MED ORDER — OXYCODONE-ACETAMINOPHEN 5-325 MG PO TABS: 2.0000 | ORAL_TABLET | Freq: Four times a day (QID) | ORAL | Status: DC | PRN

## 2012-12-22 NOTE — ED Notes (Signed)
Pt reports taking asprin and vicodin at home with no relief.

## 2012-12-22 NOTE — ED Provider Notes (Signed)
Medical screening examination/treatment/procedure(s) were conducted as a shared visit with non-physician practitioner(s) and myself.  I personally evaluated the patient during the encounter   Domanick Cuccia, MD 12/22/12 1505 

## 2012-12-22 NOTE — ED Notes (Signed)
Pt reports bilateral hand tingling since Saturday.  Woke up this morning with neck pain and inability to move neck.  Denies injury. Denies recent illness.  Pt ambulatory without difficulty.

## 2012-12-22 NOTE — ED Provider Notes (Signed)
History     CSN: [id number]  Arrival date & time 12/22/12  1044   First MD Initiated Contact with Patient 12/22/12 1051      Chief Complaint  Patient presents with  . Neck Pain    (Consider location/radiation/quality/duration/timing/severity/associated sxs/prior treatment) HPI Comments: This is an 79 year old male, who presents emergency department with chief complaint of neck pain. Patient states that he has noticed stiffening neck muscles for the past several days, additionally he reports some tingling in bilateral hands, but states that this is resolved. He states that his pain is worsened with neck movement. He has tried taking a hydrocodone with no relief. He denies any mechanism of injury. He denies any other symptoms at this time. States that his pain is now severe, and he like something to help management.   The history is provided by the patient. No language interpreter was used.    Past Medical History  Diagnosis Date  . Hyperlipidemia   . CAD (coronary artery disease)   . Colon polyps     Past Surgical History  Procedure Date  . Inguinal hernia repair   . Rotator cuff repair   . Pilonidal cyst drainage     Family History  Problem Relation Age of Onset  . Cancer Son     prostate  . Stroke Father     History  Substance Use Topics  . Smoking status: Former Smoker    Types: Cigarettes    Quit date: 12/03/1970  . Smokeless tobacco: Not on file  . Alcohol Use: Yes      Review of Systems  All other systems reviewed and are negative.    Allergies  Lipitor  Home Medications   Current Outpatient Rx  Name  Route  Sig  Dispense  Refill  . COENZYME Q10 30 MG PO CAPS   Oral   Take 120 mg by mouth daily.          Marland Kitchen VITAMIN D2 2000 UNITS PO TABS   Oral   Take by mouth.         . OMEGA-3 FATTY ACIDS 1000 MG PO CAPS   Oral   Take 2 g by mouth daily.         Marland Kitchen MAGNESIUM 500 MG PO CAPS   Oral   Take 1 capsule by mouth daily.         Marland Kitchen  ONE-DAILY MULTI VITAMINS PO TABS   Oral   Take 1 tablet by mouth daily.         Marland Kitchen PRAVACHOL 40 MG PO TABS      TAKE (1) TABLET BY MOUTH AT BEDTIME.   30 each   5     BP 171/73  Pulse 68  Temp 97.4 F (36.3 C) (Oral)  Resp 20  SpO2 99%  Physical Exam  Nursing note and vitals reviewed. Constitutional: He is oriented to person, place, and time. He appears well-developed and well-nourished.  HENT:  Head: Normocephalic and atraumatic.  Nose: Nose normal.  Mouth/Throat: Oropharynx is clear and moist. No oropharyngeal exudate.  Eyes: Conjunctivae normal and EOM are normal. Pupils are equal, round, and reactive to light. Right eye exhibits no discharge. Left eye exhibits no discharge. No scleral icterus.  Neck: No JVD present.       Range of motion decreased secondary to pain, cervical paraspinal muscles and upper trapezius tender to palpation and very tight  Cardiovascular: Normal rate, regular rhythm, normal heart sounds and intact distal pulses.  Exam reveals no gallop and no friction rub.   No murmur heard. Pulmonary/Chest: Effort normal and breath sounds normal. No respiratory distress. He has no wheezes. He has no rales. He exhibits no tenderness.  Abdominal: Soft. Bowel sounds are normal. He exhibits no distension and no mass. There is no tenderness. There is no rebound and no guarding.  Musculoskeletal: Normal range of motion. He exhibits no edema and no tenderness.       Upper and lower extremity range of motion and strength intact bilaterally  Neurological: He is alert and oriented to person, place, and time. He has normal reflexes.       CN 3-12 intact  Skin: Skin is warm and dry.  Psychiatric: He has a normal mood and affect. His behavior is normal. Judgment and thought content normal.    ED Course  Procedures (including critical care time)  Dg Cervical Spine Complete  12/22/2012  *RADIOLOGY REPORT*  Clinical Data: Neck pain.  CERVICAL SPINE - COMPLETE 4+ VIEW   Comparison: None.  Findings: Degenerative disc disease changes at C4-5 through C6-7. No malalignment.  No fracture.  Prevertebral soft tissues are normal.  Mild right neural foraminal narrowing from C3-4 through C5- 6.  Severe left neural foraminal narrowing at C3-4.  IMPRESSION: Degenerative changes as above.  No acute findings.   Original Report Authenticated By: Charlett Nose, M.D.       1. Muscle spasm   2. Degenerative disc disease, cervical       MDM  79 year old male with neck pain.  Will give the patient some percocet while in the ED and obtain a plain film of the neck.  Doubt fracture, suspect arthritic changes.  12:47 PM Will discharge the patient with some percocet, robaxin, and ibuprofen.  This patient has been seen by and discussed with Dr. Ranae Palms, who agrees with the plan.        Roxy Horseman, PA-C 12/22/12 1248

## 2012-12-22 NOTE — Telephone Encounter (Signed)
Caller: Hameed/Patient; Phone: [phone]; Reason for Call: RN returned call to patient.  RN spoke with Patient's Spouse/Katherine.  States Patient is currently being evaluated in the ED at [facility].  Triage assessment not completed due to patient currently being evaluated in the ED.

## 2012-12-23 ENCOUNTER — Other Ambulatory Visit: Payer: Self-pay | Admitting: Orthopedic Surgery

## 2012-12-23 DIAGNOSIS — M542 Cervicalgia: Secondary | ICD-10-CM

## 2012-12-26 ENCOUNTER — Ambulatory Visit
Admission: RE | Admit: 2012-12-26 | Discharge: 2012-12-26 | Disposition: A | Discharge status: Home / Self Care | Payer: Medicare Other | Source: Ambulatory Visit | Attending: Orthopedic Surgery | Admitting: Orthopedic Surgery

## 2012-12-26 DIAGNOSIS — M542 Cervicalgia: Secondary | ICD-10-CM

## 2012-12-27 ENCOUNTER — Other Ambulatory Visit: Payer: BC Managed Care – PPO

## 2013-01-23 ENCOUNTER — Encounter: Payer: Self-pay | Admitting: Internal Medicine

## 2013-01-23 ENCOUNTER — Ambulatory Visit (INDEPENDENT_AMBULATORY_CARE_PROVIDER_SITE_OTHER): Payer: Medicare Other | Admitting: Internal Medicine

## 2013-01-23 ENCOUNTER — Ambulatory Visit (INDEPENDENT_AMBULATORY_CARE_PROVIDER_SITE_OTHER)
Admission: RE | Admit: 2013-01-23 | Discharge: 2013-01-23 | Disposition: A | Discharge status: Home / Self Care | Payer: Medicare Other | Source: Ambulatory Visit | Attending: Internal Medicine | Admitting: Internal Medicine

## 2013-01-23 VITALS — BP 130/72 | Temp 100.2°F | Wt 191.0 lb

## 2013-01-23 DIAGNOSIS — R509 Fever, unspecified: Secondary | ICD-10-CM

## 2013-01-23 DIAGNOSIS — R6883 Chills (without fever): Secondary | ICD-10-CM

## 2013-01-23 LAB — CBC WITH DIFFERENTIAL/PLATELET
Basophils Absolute: 0 10*3/uL (ref 0.0–0.1)
Eosinophils Absolute: 0 10*3/uL (ref 0.0–0.7)
Hemoglobin: 14.2 g/dL (ref 13.0–17.0)
Lymphocytes Relative: 28.7 % (ref 12.0–46.0)
MCHC: 34.1 g/dL (ref 30.0–36.0)
Monocytes Relative: 9 % (ref 3.0–12.0)
Neutrophils Relative %: 61.6 % (ref 43.0–77.0)
Platelets: 111 10*3/uL — ABNORMAL LOW (ref 150.0–400.0)
RDW: 12.7 % (ref 11.5–14.6)

## 2013-01-23 LAB — POCT URINALYSIS DIPSTICK
Bilirubin, UA: NEGATIVE
Glucose, UA: NEGATIVE
Leukocytes, UA: NEGATIVE
Nitrite, UA: NEGATIVE
Urobilinogen, UA: 0.2

## 2013-01-23 LAB — BASIC METABOLIC PANEL
CO2: 26 mEq/L (ref 19–32)
Calcium: 8.6 mg/dL (ref 8.4–10.5)
Creatinine, Ser: 1 mg/dL (ref 0.4–1.5)
GFR: 73.65 mL/min (ref >60.00)
Glucose, Bld: 104 mg/dL — ABNORMAL HIGH (ref 70–99)
Sodium: 131 mEq/L — ABNORMAL LOW (ref 135–145)

## 2013-01-23 LAB — POCT INFLUENZA A/B: Influenza B, POC: NEGATIVE

## 2013-01-23 MED ORDER — AZITHROMYCIN 250 MG PO TABS: ORAL_TABLET | ORAL | Status: DC

## 2013-01-23 MED ORDER — CEFPODOXIME PROXETIL 200 MG PO TABS: 200.0000 mg | ORAL_TABLET | Freq: Two times a day (BID) | ORAL | Status: DC

## 2013-01-23 NOTE — Progress Notes (Signed)
  Subjective:    Patient ID: Sean Miranda, male    DOB: 1930-02-20, 79 y.o.   MRN: [id number]  HPI  79 year old white male with history of coronary artery disease and hyperlipidemia complains of fever chills and achiness over last 4-5 days. Symptoms started on Sunday afternoon. Patient complains of intermittent cough.  He has mild nasal congestion.  Remote history of tobacco use-he quit in 1974.  Review of Systems Positive for chills, negative for urinary complaints, mild nausea.  No diarrhea  Past Medical History  Diagnosis Date  . Hyperlipidemia   . CAD (coronary artery disease)   . Colon polyps     History   Social History  . Marital Status: Married    Spouse Name: N/A    Number of Children: N/A  . Years of Education: N/A   Occupational History  . Not on file.   Social History Main Topics  . Smoking status: Former Smoker    Types: Cigarettes    Quit date: 12/03/1970  . Smokeless tobacco: Not on file  . Alcohol Use: Yes  . Drug Use: No  . Sexually Active:    Other Topics Concern  . Not on file   Social History Narrative  . No narrative on file    Past Surgical History  Procedure Laterality Date  . Inguinal hernia repair    . Rotator cuff repair    . Pilonidal cyst drainage      Family History  Problem Relation Age of Onset  . Cancer Son     prostate  . Stroke Father     Allergies  Allergen Reactions  . Lipitor (Atorvastatin Calcium)     Muscle pain    Current Outpatient Prescriptions on File Prior to Visit  Medication Sig Dispense Refill  . Multiple Vitamin (MULTIVITAMIN) tablet Take 1 tablet by mouth daily.      . methocarbamol (ROBAXIN) 500 MG tablet Take 1 tablet (500 mg total) by mouth 2 (two) times daily.  20 tablet  0  . oxyCODONE-acetaminophen (PERCOCET/ROXICET) 5-325 MG per tablet Take 2 tablets by mouth every 6 (six) hours as needed for pain.  15 tablet  0   No current facility-administered medications on file prior to visit.     BP 130/72  Temp(Src) 100.2 F (37.9 C) (Oral)  Wt 191 lb (86.637 kg)  BMI 25.9 kg/m2       Objective:   Physical Exam  Constitutional: He is oriented to person, place, and time. He appears well-developed and well-nourished.  HENT:  Head: Normocephalic and atraumatic.  Right Ear: External ear normal.  Left Ear: External ear normal.  Oropharyngeal erythema  Neck: Neck supple.  No neck tenderness  Cardiovascular: Normal rate, regular rhythm and normal heart sounds.   Pulmonary/Chest: Effort normal and breath sounds normal. He has no wheezes.  Musculoskeletal: He exhibits no edema.  Neurological: He is alert and oriented to person, place, and time. No cranial nerve deficit.  Psychiatric: He has a normal mood and affect. His behavior is normal.          Assessment & Plan:

## 2013-01-23 NOTE — Patient Instructions (Addendum)
Increase fluid intake Use tylenol 650 mg every 8 hrs as needed Please contact our office if your symptoms do not improve or gets worse. Follow up with Dr. Cato Mulligan within 1 week if you are not feeling better

## 2013-01-23 NOTE — Assessment & Plan Note (Signed)
79 year old white male presents with 4-5 days of fever chills and achiness. Rapid influenza test was negative. Obtain CBCD, UA and chest x-ray given advanced age.  Addendum - chest x-ray showed possible right lower lobe opacity concerning for pneumonia. Patient advised to take azithromycin and Vantin as directed. Reassess in one week. Patient advised to call office if symptoms persist or worsen.

## 2013-01-27 ENCOUNTER — Telehealth: Payer: Self-pay | Admitting: Internal Medicine

## 2013-01-27 ENCOUNTER — Ambulatory Visit (INDEPENDENT_AMBULATORY_CARE_PROVIDER_SITE_OTHER): Payer: Medicare Other | Admitting: Internal Medicine

## 2013-01-27 ENCOUNTER — Encounter: Payer: Self-pay | Admitting: Internal Medicine

## 2013-01-27 VITALS — BP 154/84 | Temp 98.0°F | Wt 188.0 lb

## 2013-01-27 DIAGNOSIS — R509 Fever, unspecified: Secondary | ICD-10-CM

## 2013-01-27 DIAGNOSIS — L270 Generalized skin eruption due to drugs and medicaments taken internally: Secondary | ICD-10-CM

## 2013-01-27 MED ORDER — PREDNISONE 10 MG PO TABS: ORAL_TABLET | ORAL | Status: DC

## 2013-01-27 NOTE — Progress Notes (Signed)
  Subjective:    Patient ID: Sean Miranda, male    DOB: 01-13-1930, 79 y.o.   MRN: [id number]  HPI  79 year old previously seen for febrile illness for followup. Chest x-ray showed possible right lower lobe infiltrate consistent with pneumonia. Patient was treated with Vantin and azithromycin. 2-3 days ago patient developed a rash. He has mainly noticed it in his hands bilaterally. Upon further questioning, rash also apparent and chest, back, and upper legs.  Rash not pruritic or painful.  Review of Systems Negative for fever chills  Past Medical History  Diagnosis Date  . Hyperlipidemia   . CAD (coronary artery disease)   . Colon polyps     History   Social History  . Marital Status: Married    Spouse Name: N/A    Number of Children: N/A  . Years of Education: N/A   Occupational History  . Not on file.   Social History Main Topics  . Smoking status: Former Smoker    Types: Cigarettes    Quit date: 12/03/1970  . Smokeless tobacco: Not on file  . Alcohol Use: Yes  . Drug Use: No  . Sexually Active:    Other Topics Concern  . Not on file   Social History Narrative  . No narrative on file    Past Surgical History  Procedure Laterality Date  . Inguinal hernia repair    . Rotator cuff repair    . Pilonidal cyst drainage      Family History  Problem Relation Age of Onset  . Cancer Son     prostate  . Stroke Father     Allergies  Allergen Reactions  . Lipitor (Atorvastatin Calcium)     Muscle pain  . Vantin (Cefpodoxime) Rash    Current Outpatient Prescriptions on File Prior to Visit  Medication Sig Dispense Refill  . methocarbamol (ROBAXIN) 500 MG tablet Take 1 tablet (500 mg total) by mouth 2 (two) times daily.  20 tablet  0  . Multiple Vitamin (MULTIVITAMIN) tablet Take 1 tablet by mouth daily.      Marland Kitchen oxyCODONE-acetaminophen (PERCOCET/ROXICET) 5-325 MG per tablet Take 2 tablets by mouth every 6 (six) hours as needed for pain.  15 tablet  0   No  current facility-administered medications on file prior to visit.    BP 154/84  Temp(Src) 98 F (36.7 C) (Oral)  Wt 188 lb (85.276 kg)  BMI 25.49 kg/m2       Objective:   Physical Exam  Constitutional: He appears well-developed and well-nourished.  Cardiovascular: Normal rate and regular rhythm.   Pulmonary/Chest: Effort normal and breath sounds normal. He has no wheezes.  Skin:  Macular eruption over chest, upper arms, back,  upper legs, and hands (palms and dorsal aspect of fingers)          Assessment & Plan:

## 2013-01-27 NOTE — Patient Instructions (Addendum)
Please contact our office if your symptoms do not improve or gets worse.  

## 2013-01-27 NOTE — Telephone Encounter (Signed)
For your review

## 2013-01-27 NOTE — Assessment & Plan Note (Signed)
79 year old white male recently treated for possible right lower lobe pneumonia with azithromycin and [place]. He now has diffuse rash for 2-3 days. I suspect he is allergic to cephalosporins. Discontinue all antibiotics. Treat with prednisone taper. Reassess in 2 weeks.  Patient advised to call office if symptoms persist or worsen.

## 2013-01-27 NOTE — Telephone Encounter (Signed)
Patient Information:  Caller Name: Annette Stable  Phone: [phone]  Patient: Sean Miranda, Sean Miranda  Gender: Male  DOB: 10/29/1930  Age: 79 Years  PCP: Artist Pais Doe-Hyun Molly Maduro) (Adults only)  Office Follow Up:  Does the office need to follow up with this patient?: No  Instructions For The Office: N/A  RN Note:  Patient c/o 2-day history of hives on both hands.  Onset 01/25/13 after starting antibiotic for pneumonia 01/23/13.  Started second antibiotic on 01/24/13 after pharmacy received the medication and could fill it.  Currently afebrile.  Per rash on drugs protocol, emergent symptoms denied; advised appt today.  Appt scheduled 01/27/13 1315 with Dr. Artist Pais.  krs/can  Symptoms  Reason For Call & Symptoms: hives on hands on antibiotics for pneumonia  Reviewed Health History In EMR: Yes  Reviewed Medications In EMR: Yes  Reviewed Allergies In EMR: Yes  Reviewed Surgeries / Procedures: Yes  Date of Onset of Symptoms: 01/25/2013  Guideline(s) Used:  Rash - Widespread on Drugs - Drug Reaction  Disposition Per Guideline:   See Today in Office  Reason For Disposition Reached:   Hives  Advice Given:  N/A  Appointment Scheduled:  01/27/2013 13:15:00 Appointment Scheduled Provider:  Artist Pais, Doe-Hyun Molly Maduro) (Adults only)

## 2013-01-27 NOTE — Assessment & Plan Note (Signed)
Patient found to have possible right lower lobe pneumonia. He was treated with azithromycin Vantin. Cough and fever have completely resolved.

## 2013-01-30 ENCOUNTER — Ambulatory Visit: Payer: Medicare Other | Admitting: Internal Medicine

## 2013-02-10 ENCOUNTER — Encounter: Payer: Self-pay | Admitting: Internal Medicine

## 2013-02-10 ENCOUNTER — Ambulatory Visit (INDEPENDENT_AMBULATORY_CARE_PROVIDER_SITE_OTHER): Payer: Medicare Other | Admitting: Internal Medicine

## 2013-02-10 VITALS — BP 120/60 | Temp 98.8°F | Wt 187.0 lb

## 2013-02-10 DIAGNOSIS — L270 Generalized skin eruption due to drugs and medicaments taken internally: Secondary | ICD-10-CM

## 2013-02-10 NOTE — Patient Instructions (Addendum)
Follow up with Dr. Cato Mulligan for your annual physical

## 2013-02-10 NOTE — Progress Notes (Signed)
  Subjective:    Patient ID: Sean Miranda, male    DOB: Oct 12, 1930, 79 y.o.   MRN: [id number]  HPI  79 year old white male previously seen for drug rash for follow up.  Patient finished prednisone taper as directed. He did not experience any side effects. His rash has completely resolved.  He had allergic reaction to cephalosporin. He was on cephalosporin and azithromycin for possible pneumonia. Patient reports respiratory status has returned to baseline. He still struggles with slight fatigue.   Review of Systems Mild fatigue  Past Medical History  Diagnosis Date  . Hyperlipidemia   . CAD (coronary artery disease)   . Colon polyps     History   Social History  . Marital Status: Married    Spouse Name: N/A    Number of Children: N/A  . Years of Education: N/A   Occupational History  . Not on file.   Social History Main Topics  . Smoking status: Former Smoker    Types: Cigarettes    Quit date: 12/03/1970  . Smokeless tobacco: Not on file  . Alcohol Use: Yes  . Drug Use: No  . Sexually Active:    Other Topics Concern  . Not on file   Social History Narrative  . No narrative on file    Past Surgical History  Procedure Laterality Date  . Inguinal hernia repair    . Rotator cuff repair    . Pilonidal cyst drainage      Family History  Problem Relation Age of Onset  . Cancer Son     prostate  . Stroke Father     Allergies  Allergen Reactions  . Lipitor (Atorvastatin Calcium)     Muscle pain  . Vantin (Cefpodoxime) Rash    Current Outpatient Prescriptions on File Prior to Visit  Medication Sig Dispense Refill  . Multiple Vitamin (MULTIVITAMIN) tablet Take 1 tablet by mouth daily.       No current facility-administered medications on file prior to visit.    BP 120/60  Temp(Src) 98.8 F (37.1 C) (Oral)  Wt 187 lb (84.823 kg)  BMI 25.36 kg/m2       Objective:   Physical Exam  Constitutional: He appears well-developed and well-nourished.   Cardiovascular: Normal rate, regular rhythm and normal heart sounds.   Pulmonary/Chest: Effort normal and breath sounds normal. He has no wheezes.  Skin: Skin is warm and dry. No rash noted.          Assessment & Plan:

## 2013-02-10 NOTE — Assessment & Plan Note (Signed)
Resolved with course of prednisone.  Patient likely had allergic response to cephalosporin.

## 2013-03-09 ENCOUNTER — Encounter (INDEPENDENT_AMBULATORY_CARE_PROVIDER_SITE_OTHER): Payer: Medicare Other

## 2013-03-09 DIAGNOSIS — I6529 Occlusion and stenosis of unspecified carotid artery: Secondary | ICD-10-CM

## 2013-03-12 ENCOUNTER — Telehealth: Payer: Self-pay | Admitting: Internal Medicine

## 2013-03-12 NOTE — Telephone Encounter (Signed)
ROI signed, Doppler Mailed to Pt 03/12/13/KM

## 2013-03-17 ENCOUNTER — Other Ambulatory Visit: Payer: BC Managed Care – PPO

## 2013-03-17 ENCOUNTER — Other Ambulatory Visit (INDEPENDENT_AMBULATORY_CARE_PROVIDER_SITE_OTHER): Payer: Medicare Other

## 2013-03-17 DIAGNOSIS — Z0000 Encounter for general adult medical examination without abnormal findings: Secondary | ICD-10-CM

## 2013-03-17 LAB — HEPATIC FUNCTION PANEL
ALT: 19 U/L (ref 0–53)
AST: 22 U/L (ref 0–37)
Bilirubin, Direct: 0.1 mg/dL (ref 0.0–0.3)
Total Bilirubin: 0.7 mg/dL (ref 0.3–1.2)
Total Protein: 6.5 g/dL (ref 6.0–8.3)

## 2013-03-17 LAB — BASIC METABOLIC PANEL
BUN: 17 mg/dL (ref 6–23)
Chloride: 100 mEq/L (ref 96–112)
GFR: 80.83 mL/min (ref >60.00)
Potassium: 4.3 mEq/L (ref 3.5–5.1)
Sodium: 133 mEq/L — ABNORMAL LOW (ref 135–145)

## 2013-03-17 LAB — CBC WITH DIFFERENTIAL/PLATELET
Basophils Relative: 0.2 % (ref 0.0–3.0)
Eosinophils Relative: 0 % (ref 0.0–5.0)
HCT: 39 % (ref 39.0–52.0)
Lymphs Abs: 2.2 10*3/uL (ref 0.7–4.0)
Monocytes Relative: 10.3 % (ref 3.0–12.0)
Platelets: 181 10*3/uL (ref 150.0–400.0)
RBC: 4.27 Mil/uL (ref 4.22–5.81)
WBC: 5.1 10*3/uL (ref 4.5–10.5)

## 2013-03-17 LAB — TSH: TSH: 1.46 u[IU]/mL (ref 0.35–5.50)

## 2013-03-17 LAB — POCT URINALYSIS DIPSTICK
Bilirubin, UA: NEGATIVE
Glucose, UA: NEGATIVE
Spec Grav, UA: 1.015
pH, UA: 7

## 2013-03-17 LAB — LIPID PANEL
HDL: 37.1 mg/dL — ABNORMAL LOW (ref >39.00)
Total CHOL/HDL Ratio: 6
Triglycerides: 78 mg/dL (ref 0.0–149.0)

## 2013-03-17 LAB — LDL CHOLESTEROL, DIRECT: Direct LDL: 165.2 mg/dL

## 2013-03-24 ENCOUNTER — Encounter: Payer: BC Managed Care – PPO | Admitting: Internal Medicine

## 2013-04-02 ENCOUNTER — Encounter (INDEPENDENT_AMBULATORY_CARE_PROVIDER_SITE_OTHER): Payer: Medicare Other

## 2013-04-02 ENCOUNTER — Ambulatory Visit (INDEPENDENT_AMBULATORY_CARE_PROVIDER_SITE_OTHER): Payer: Medicare Other | Admitting: Internal Medicine

## 2013-04-02 ENCOUNTER — Encounter: Payer: Self-pay | Admitting: Internal Medicine

## 2013-04-02 VITALS — BP 144/80 | Temp 98.0°F | Wt 191.0 lb

## 2013-04-02 DIAGNOSIS — R609 Edema, unspecified: Secondary | ICD-10-CM

## 2013-04-02 DIAGNOSIS — M7989 Other specified soft tissue disorders: Secondary | ICD-10-CM

## 2013-04-02 NOTE — Assessment & Plan Note (Signed)
79 year old white male with painless edema of left ankle. 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 Obtain venous Doppler to rule out DVT.

## 2013-04-02 NOTE — Patient Instructions (Addendum)
Our office will contact you re: results of venous duplex

## 2013-04-02 NOTE — Progress Notes (Signed)
  Subjective:    Patient ID: Sean Miranda, male    DOB: 08/31/1930, 79 y.o.   MRN: [id number]  HPI  79 year old white male with history of coronary artery disease and peripheral vascular disease complains of left ankle swelling x 2 weeks. He denies any preceding injury or trauma. 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 He denies previous ankle or leg injury.    Review of Systems Negative for chest pain or shortness of breath  Past Medical History  Diagnosis Date  . Hyperlipidemia   . CAD (coronary artery disease)   . Colon polyps     History   Social History  . Marital Status: Married    Spouse Name: N/A    Number of Children: N/A  . Years of Education: N/A   Occupational History  . Not on file.   Social History Main Topics  . Smoking status: Former Smoker    Types: Cigarettes    Quit date: 12/03/1970  . Smokeless tobacco: Not on file  . Alcohol Use: Yes  . Drug Use: No  . Sexually Active:    Other Topics Concern  . Not on file   Social History Narrative  . No narrative on file    Past Surgical History  Procedure Laterality Date  . Inguinal hernia repair    . Rotator cuff repair    . Pilonidal cyst drainage      Family History  Problem Relation Age of Onset  . Cancer Son     prostate  . Stroke Father     Allergies  Allergen Reactions  . Lipitor (Atorvastatin Calcium)     Muscle pain  . Vantin (Cefpodoxime) Rash    Current Outpatient Prescriptions on File Prior to Visit  Medication Sig Dispense Refill  . Multiple Vitamin (MULTIVITAMIN) tablet Take 1 tablet by mouth daily.       No current facility-administered medications on file prior to visit.    BP 144/80  Temp(Src) 98 F (36.7 C) (Oral)  Wt 191 lb (86.637 kg)  BMI 25.9 kg/m2       Objective:   Physical Exam  Constitutional: He appears well-developed and well-nourished.  Cardiovascular: Normal rate, regular rhythm and normal heart sounds.   Pulmonary/Chest: Effort normal  and breath sounds normal. He has no wheezes.  Abdominal: Soft. Bowel sounds are normal. He exhibits no mass.  Negative for inguinal adenopathy  Musculoskeletal:  Left ankle edema.  No calf tenderness  Psychiatric: He has a normal mood and affect. His behavior is normal.          Assessment & Plan:

## 2013-04-03 ENCOUNTER — Telehealth: Payer: Self-pay | Admitting: *Deleted

## 2013-04-03 NOTE — Telephone Encounter (Signed)
Message copied by Jacqualyn Posey on Fri Apr 03, 2013  1:48 PM ------      Message from: Meda Coffee      Created: Fri Apr 03, 2013  1:09 PM      Regarding: FW: prelim       Call pt - let him know venous duplex was negative for DVT            RY      ----- Message -----         From: Richardean Canal         Sent: 04/02/2013   5:25 PM           To: Meda Coffee, DO      Subject: prelim                                                   Venous duplex of the left leg appears negative for acute DVT.            Thank you       ------

## 2013-04-03 NOTE — Telephone Encounter (Signed)
Pt aware.

## 2013-04-15 ENCOUNTER — Other Ambulatory Visit: Payer: Medicare Other

## 2013-04-22 ENCOUNTER — Encounter: Payer: Self-pay | Admitting: Internal Medicine

## 2013-04-22 ENCOUNTER — Ambulatory Visit (INDEPENDENT_AMBULATORY_CARE_PROVIDER_SITE_OTHER): Payer: Medicare Other | Admitting: Internal Medicine

## 2013-04-22 VITALS — BP 130/70 | HR 64 | Temp 97.8°F | Ht 72.0 in | Wt 190.0 lb

## 2013-04-22 DIAGNOSIS — Z0000 Encounter for general adult medical examination without abnormal findings: Secondary | ICD-10-CM

## 2013-04-22 DIAGNOSIS — D481 Neoplasm of uncertain behavior of connective and other soft tissue: Secondary | ICD-10-CM | POA: Insufficient documentation

## 2013-04-22 DIAGNOSIS — D492 Neoplasm of unspecified behavior of bone, soft tissue, and skin: Secondary | ICD-10-CM

## 2013-04-24 NOTE — Progress Notes (Signed)
Patient ID: Sean Miranda, male   DOB: Sep 12, 1930, 79 y.o.   MRN: [id number] cpx  Past Medical History  Diagnosis Date  . Hyperlipidemia   . CAD (coronary artery disease)   . Colon polyps     History   Social History  . Marital Status: Married    Spouse Name: N/A    Number of Children: N/A  . Years of Education: N/A   Occupational History  . Not on file.   Social History Main Topics  . Smoking status: Former Smoker    Types: Cigarettes    Quit date: 12/03/1970  . Smokeless tobacco: Not on file  . Alcohol Use: Yes  . Drug Use: No  . Sexually Active:    Other Topics Concern  . Not on file   Social History Narrative  . No narrative on file    Past Surgical History  Procedure Laterality Date  . Inguinal hernia repair    . Rotator cuff repair    . Pilonidal cyst drainage      Family History  Problem Relation Age of Onset  . Cancer Son     prostate  . Stroke Father     Allergies  Allergen Reactions  . Lipitor (Atorvastatin Calcium)     Muscle pain  . Vantin (Cefpodoxime) Rash    Current Outpatient Prescriptions on File Prior to Visit  Medication Sig Dispense Refill  . Multiple Vitamin (MULTIVITAMIN) tablet Take 1 tablet by mouth daily.       No current facility-administered medications on file prior to visit.     patient denies chest pain, shortness of breath, orthopnea. Denies lower extremity edema, abdominal pain, change in appetite, change in bowel movements. Patient denies rashes, musculoskeletal complaints. No other specific complaints in a complete review of systems.   BP 130/70  Pulse 64  Temp(Src) 97.8 F (36.6 C) (Oral)  Ht 6' (1.829 m)  Wt 190 lb (86.183 kg)  BMI 25.76 kg/m2   well-developed well-nourished male in no acute distress. HEENT exam atraumatic, normocephalic, neck supple without jugular venous distention. Chest clear to auscultation cardiac exam S1-S2 are regular. Abdominal exam overweight with bowel sounds, soft and nontender.  Extremities no edema. Neurologic exam is alert with a normal gait.  A/p: well visit , health maint utd

## 2013-05-08 ENCOUNTER — Ambulatory Visit: Payer: Medicare Other | Admitting: Internal Medicine

## 2013-05-21 ENCOUNTER — Ambulatory Visit (INDEPENDENT_AMBULATORY_CARE_PROVIDER_SITE_OTHER): Payer: Medicare Other | Admitting: Internal Medicine

## 2013-05-21 ENCOUNTER — Encounter: Payer: Self-pay | Admitting: Internal Medicine

## 2013-05-21 VITALS — BP 154/70 | HR 62 | Ht 72.0 in | Wt 193.0 lb

## 2013-05-21 DIAGNOSIS — I6529 Occlusion and stenosis of unspecified carotid artery: Secondary | ICD-10-CM

## 2013-05-21 DIAGNOSIS — E785 Hyperlipidemia, unspecified: Secondary | ICD-10-CM

## 2013-05-21 DIAGNOSIS — I2510 Atherosclerotic heart disease of native coronary artery without angina pectoris: Secondary | ICD-10-CM

## 2013-05-21 NOTE — Progress Notes (Signed)
OFFICE NOTE  Chief Complaint:  Routine followup  Primary Care Physician: Sean Petit, MD  HPI:  Sean Miranda is a pleasant 79 yo male, previously followed by Dr., Sean Miranda, who has calcification of his coronary arteries documented by catheterization in 2009, but no occlusive disease. He had a nuclear study May 2012 that was a low risk and a 67% ejection fraction. He has known carotid disease, had Doppler studies done through [facility] that shows less than 80% on the right and less than 60% on the left, which is an increase from his Doppler study done a year previously, and he is currently supposedly scheduled for repeat study in 6 months. He has had no episodes of angina, no unusual shortness of breath, no awareness of any arrhythmias. He has had no ankle edema. He denies dizziness, lightheadedness, unilateral weakness, slurred speech or blurred vision. He checks his blood pressure regularly at home. It has been under good control. He has no unusual shortness of breath and has had no changes in exercise tolerance or stamina. He had had significant myalgias from Lipitor in the past, and I am a little concerned he may develop recurrent myalgias from the daily dose of Pravachol but I see no reason we cannot try this in view of his lipids which now show an LDL of 142 with an HDL of 51.  Unfortunately he was intolerant of Pravachol and other statins, is not currently taking them. He recently had repeat carotid Dopplers which showed high grade velocities in the right carotid and the upper end of the range of 79%. His carotids are followed that with our imaging and Dr. Cato Miranda is coordinating that.  PMHx:  Past Medical History  Diagnosis Date  . Hyperlipidemia   . CAD (coronary artery disease)   . Colon polyps     Past Surgical History  Procedure Laterality Date  . Inguinal hernia repair    . Rotator cuff repair    . Pilonidal cyst drainage      FAMHx:  Family History  Problem  Relation Age of Onset  . Cancer Son     prostate  . Stroke Father     SOCHx:   reports that he quit smoking about 40 years ago. His smoking use included Cigarettes. He smoked 0.00 packs per day. He has never used smokeless tobacco. He reports that  drinks alcohol. He reports that he does not use illicit drugs.  ALLERGIES:  Allergies  Allergen Reactions  . Lipitor (Atorvastatin Calcium)     Muscle pain  . Vantin (Cefpodoxime) Rash    ROS: A comprehensive review of systems was negative except for: Respiratory: positive for dyspnea on exertion  HOME MEDS: Current Outpatient Prescriptions  Medication Sig Dispense Refill  . ALPHA LIPOIC ACID PO Take 300 mg by mouth daily.      . Cholecalciferol (VITAMIN D3) 2000 UNITS capsule Take 2,000 Units by mouth daily. Take 2,000 Units by mouth daily.      . Coenzyme Q10-Levocarnitine 100-20 MG CAPS Take 1 capsule by mouth daily. Take by mouth.      . Glucosamine-Chondroitin 500-400 MG CAPS Take 1 capsule by mouth daily. Take by mouth.      . Magnesium 500 MG TABS Take 1 tablet by mouth daily.      . Multiple Vitamin (MULTIVITAMIN) tablet Take 0.5 tablets by mouth daily.       . OMEGA-3 1000 MG CAPS Take 4,000 mg by mouth daily.      Marland Kitchen  Probiotic Product (PROBIOTIC DAILY PO) Take 1 capsule by mouth daily.       No current facility-administered medications for this visit.    LABS/IMAGING: No results found for this or any previous visit (from the past 48 hour(s)). No results found.  VITALS: BP 154/70  Pulse 62  Ht 6' (1.829 m)  Wt 193 lb (87.544 kg)  BMI 26.17 kg/m2  EXAM: General appearance: alert and no distress Neck: no adenopathy, no carotid bruit, no JVD, supple, symmetrical, trachea midline and thyroid not enlarged, symmetric, no tenderness/mass/nodules Lungs: clear to auscultation bilaterally Heart: regular rate and rhythm, S1, S2 normal, no murmur, click, rub or gallop Abdomen: soft, non-tender; bowel sounds normal; no masses,   no organomegaly Extremities: extremities normal, atraumatic, no cyanosis or edema Pulses: 2+ and symmetric Skin: Skin color, texture, turgor normal. No rashes or lesions Neurologic: Grossly normal  EKG: Sinus rhythm at 62  ASSESSMENT: 1. Calcified coronaries with no obstructive disease 2. Moderate to severe right internal carotid artery stenosis 3. Dyslipidemia, intolerant to statins 4. Hypertension-controlled  PLAN: 1.   [name] continues to do well without symptoms. He was recently in [place] and has been exercising without any shortness of breath or chest pain. His carotid Doppler showed high velocities in the right internal carotid close to 80%. This is being followed at [facility] by Dr. Cato Miranda and I will defer to him for followup and appropriate his of referral to vascular surgery.  If you wish to have [place] follow his carotid Dopplers, we would be happy to do that however imaging studies would be performed in our office.  I do not think he needs referral to vascular surgery.  Otherwise he should continue on his current diet and exercise regimen. We'll plan to see him back annually or sooner as needed.  Sean Nose, MD, [facility] Attending Cardiologist [facility]  Sean Miranda C 05/21/2013, 10:07 AM

## 2013-05-21 NOTE — Patient Instructions (Signed)
Follow-up annually

## 2013-09-22 ENCOUNTER — Telehealth: Payer: Self-pay | Admitting: Internal Medicine

## 2013-09-22 NOTE — Telephone Encounter (Signed)
Pt was last seen may 2014. Pt would like to have blood work done. Pt is not due to cpx until 04-2014

## 2013-09-24 NOTE — Telephone Encounter (Signed)
Pt stated he would like to have chole etc done every 6 months

## 2013-09-28 ENCOUNTER — Ambulatory Visit (HOSPITAL_COMMUNITY): Payer: Medicare Other | Attending: Internal Medicine

## 2013-09-28 DIAGNOSIS — E785 Hyperlipidemia, unspecified: Secondary | ICD-10-CM | POA: Insufficient documentation

## 2013-09-28 DIAGNOSIS — Z87891 Personal history of nicotine dependence: Secondary | ICD-10-CM | POA: Insufficient documentation

## 2013-09-28 DIAGNOSIS — I658 Occlusion and stenosis of other precerebral arteries: Secondary | ICD-10-CM | POA: Insufficient documentation

## 2013-09-28 DIAGNOSIS — I6529 Occlusion and stenosis of unspecified carotid artery: Secondary | ICD-10-CM | POA: Insufficient documentation

## 2013-09-28 DIAGNOSIS — I2510 Atherosclerotic heart disease of native coronary artery without angina pectoris: Secondary | ICD-10-CM | POA: Insufficient documentation

## 2013-10-05 ENCOUNTER — Other Ambulatory Visit (INDEPENDENT_AMBULATORY_CARE_PROVIDER_SITE_OTHER): Payer: Medicare Other

## 2013-10-05 DIAGNOSIS — E785 Hyperlipidemia, unspecified: Secondary | ICD-10-CM

## 2013-10-05 LAB — HEPATIC FUNCTION PANEL
ALT: 15 U/L (ref 0–53)
AST: 19 U/L (ref 0–37)
Alkaline Phosphatase: 51 U/L (ref 39–117)
Bilirubin, Direct: 0.1 mg/dL (ref 0.0–0.3)
Total Protein: 7.3 g/dL (ref 6.0–8.3)

## 2013-10-12 ENCOUNTER — Telehealth: Payer: Self-pay | Admitting: Internal Medicine

## 2013-10-12 NOTE — Telephone Encounter (Signed)
Pt notified of referral order to vascular surgeon

## 2013-10-12 NOTE — Telephone Encounter (Signed)
Sean Miranda called. Stated pt in office and is also a pt of Dr. Rennis Golden.  Stated pt had carotid doppler done, which was ordered by Dr. Cato Mulligan.  Stated pt in office upset that he hasn't received results.  Advised she/pt contact ordering physician as results would have been sent there and not to Dr. Rennis Golden.  Verbalized understanding and will call Dr. Cato Mulligan office.  Number given.

## 2013-10-12 NOTE — Telephone Encounter (Signed)
Per Dr Cato Mulligan refer to vascular surgery, referral order placed

## 2013-10-12 NOTE — Telephone Encounter (Signed)
Sean Miranda calling from Cardiology to state pt is at office an is quite upset that he has not heard back from the results of carotid doppler done 10/01/13.  Pt requesting results ASAP.

## 2013-10-14 ENCOUNTER — Other Ambulatory Visit: Payer: Self-pay | Admitting: *Deleted

## 2013-10-16 ENCOUNTER — Other Ambulatory Visit: Payer: Self-pay | Admitting: *Deleted

## 2013-10-19 NOTE — Progress Notes (Signed)
He needs something stronger- Try cresotr 10 mg po qd. #90/3 refills Recheck liver, lipid and CK in 6-8 weeks

## 2013-11-05 ENCOUNTER — Encounter: Payer: Self-pay | Admitting: Vascular Surgery

## 2013-11-06 ENCOUNTER — Ambulatory Visit (HOSPITAL_COMMUNITY)
Admission: RE | Admit: 2013-11-06 | Discharge: 2013-11-06 | Disposition: A | Discharge status: Home / Self Care | Payer: Medicare Other | Source: Ambulatory Visit | Attending: Vascular Surgery | Admitting: Vascular Surgery

## 2013-11-06 ENCOUNTER — Other Ambulatory Visit: Payer: Self-pay

## 2013-11-06 ENCOUNTER — Encounter: Payer: Self-pay | Admitting: Internal Medicine

## 2013-11-06 ENCOUNTER — Ambulatory Visit (INDEPENDENT_AMBULATORY_CARE_PROVIDER_SITE_OTHER): Payer: Medicare Other | Admitting: Vascular Surgery

## 2013-11-06 ENCOUNTER — Encounter (HOSPITAL_COMMUNITY): Payer: Self-pay | Admitting: Pharmacy Technician

## 2013-11-06 ENCOUNTER — Telehealth: Payer: Self-pay | Admitting: Internal Medicine

## 2013-11-06 ENCOUNTER — Encounter: Payer: Self-pay | Admitting: Vascular Surgery

## 2013-11-06 DIAGNOSIS — I6529 Occlusion and stenosis of unspecified carotid artery: Secondary | ICD-10-CM | POA: Insufficient documentation

## 2013-11-06 NOTE — Progress Notes (Signed)
[facility] [place]  New Carotid Patient  Referred by:  Bruce H Swords, MD [address] [place], [place] [address]  Reason for referral: R carotid stenosis  History of Present Illness  Sean Miranda is a 79 y.o. (04/27/1930) male who presents with chief complaint: "problem with my artery".  Previous carotid studies demonstrated: RICA 80-99% stenosis, LICA <50% stenosis.  Patient has no history of TIA or stroke symptom.  The patient has never had amaurosis fugax or monocular blindness.  The patient has never had facial drooping or hemiplegia.  The patient has never had receptive or expressive aphasia.   The patient has a family history of CVA, which has lead to surveillance over the last 5 years.  The patient's risks factors for carotid disease include: hyperlipidemia , history of smoking.  Past Medical History  Diagnosis Date  . Hyperlipidemia   . CAD (coronary artery disease)   . Colon polyps     Past Surgical History  Procedure Laterality Date  . Inguinal hernia repair    . Rotator cuff repair Bilateral   . Pilonidal cyst drainage      History   Social History  . Marital Status: Married    Spouse Name: N/A    Number of Children: N/A  . Years of Education: N/A   Occupational History  . Not on file.   Social History Main Topics  . Smoking status: Former Smoker    Types: Cigarettes    Quit date: 12/03/1972  . Smokeless tobacco: Never Used  . Alcohol Use: 7.8 oz/week    7 Glasses of wine, 6 Shots of liquor per week     Comment: wine/scotch occasionally  . Drug Use: No  . Sexual Activity: Not on file   Other Topics Concern  . Not on file   Social History Narrative  . No narrative on file    Family History  Problem Relation Age of Onset  . Cancer Son     prostate  . Stroke Father    Current Outpatient Prescriptions on File Prior to Visit  Medication Sig Dispense Refill  . ALPHA LIPOIC ACID PO Take 300 mg by mouth daily.      .  Cholecalciferol (VITAMIN D3) 2000 UNITS capsule Take 2,000 Units by mouth daily. Take 2,000 Units by mouth daily.      . Coenzyme Q10-Levocarnitine 100-20 MG CAPS Take 1 capsule by mouth daily. Take by mouth.      . Glucosamine-Chondroitin 500-400 MG CAPS Take 1 capsule by mouth daily. Take by mouth.      . Magnesium 500 MG TABS Take 1 tablet by mouth daily.      . Multiple Vitamin (MULTIVITAMIN) tablet Take 0.5 tablets by mouth daily.       . OMEGA-3 1000 MG CAPS Take 4,000 mg by mouth daily.      . Probiotic Product (PROBIOTIC DAILY PO) Take 1 capsule by mouth daily.       No current facility-administered medications on file prior to visit.    Allergies  Allergen Reactions  . Lipitor [Atorvastatin Calcium]     Muscle pain  . Vantin [Cefpodoxime] Rash    REVIEW OF SYSTEMS:  (Positives checked otherwise negative)  CARDIOVASCULAR:  [] chest pain, [] chest pressure, [] palpitations, [] shortness of breath when laying flat, [] shortness of breath with exertion,  [] pain in feet when walking, [] pain in feet when laying flat, [] history of blood clot in   veins (DVT), [] history of phlebitis, [] swelling in legs, [] varicose veins  PULMONARY:  [] productive cough, [] asthma, [] wheezing  NEUROLOGIC:  [] weakness in arms or legs, [] numbness in arms or legs, [] difficulty speaking or slurred speech, [] temporary loss of vision in one eye, [] dizziness  HEMATOLOGIC:  [] bleeding problems, [] problems with blood clotting too easily  MUSCULOSKEL:  [] joint pain, [] joint swelling  GASTROINTEST:  [] vomiting blood, [] blood in stool     GENITOURINARY:  [] burning with urination, [] blood in urine  PSYCHIATRIC:  [] history of major depression  INTEGUMENTARY:  [] rashes, [] ulcers  CONSTITUTIONAL:  [] fever, [] chills  For VQI Use Only  PRE-ADM LIVING: Home  AMB STATUS: Ambulatory  CAD Sx: None  PRIOR CHF: None  STRESS TEST: [x] No, [ ] Normal, [ ] + ischemia, [ ] + MI, [ ]  Both  Physical Examination  Filed Vitals:   11/06/13 1011 11/06/13 1016  BP: 144/64 107/85  Pulse: 57   Height: 6' (1.829 m)   Weight: 191 lb 6.4 oz (86.818 kg)   SpO2: 100%    Body mass index is 25.95 kg/(m^2).  General: A&O x 3, WD  Head: [place]/AT  Ear/Nose/Throat: Hearing grossly intact, nares w/o erythema or drainage, oropharynx w/o Erythema/Exudate, Mallampati score: 3  Eyes: PERRLA, EOMI  Neck: Supple, no nuchal rigidity, no palpable LAD  Pulmonary: Sym exp, good air movt, CTAB, no rales, rhonchi, & wheezing  Cardiac: RRR, Nl S1, S2, no Murmurs, rubs or gallops  Vascular: Vessel Right Left  Radial Faintly Palpable Palpable  Brachial Faintly Palpable Palpable  Carotid Palpable, without bruit Palpable, without bruit  Aorta Not palpable N/A  Femoral Palpable Palpable  Popliteal Not palpable Not palpable  PT Palpable Palpable  DP Palpable Palpable   Gastrointestinal: soft, NTND, -G/R, - HSM, - masses, - CVAT B, no palpable AAA  Musculoskeletal: M/S 5/5 throughout , Extremities without ischemic changes   Neurologic: CN 2-12 intact , Pain and light touch intact in extremities , Motor exam as listed above  Psychiatric: Judgment intact, Mood & affect appropriate for pt's clinical situation  Dermatologic: See M/S exam for extremity exam, no rashes otherwise noted  Lymph : No Cervical, Axillary, or Inguinal lymphadenopathy   Non-Invasive Vascular Imaging  R CAROTID DUPLEX (Date: 11/06/2013):   R ICA stenosis: 80-99% (394/143 c/s)  R VA:  patent and antegrade  Outside Studies/Documentation 4 pages of outside documents were reviewed including: outside carotid duplex and Dr. Hilty's cardiology note.  Medical Decision Making  Sean Miranda is a 79 y.o. male who presents with: asx R ICA stenosis >80%.   Based on the patient's vascular studies and examination, I have offered the patient: R CEA.  Data from [facility] demonstrates superiority of CEA over CAS in  patients >70 y/o. I discussed with the patient the risks, benefits, and alternatives to carotid endarterectomy.   I discussed the procedural details of carotid endarterectomy with the patient.   The patient is aware that the risks of carotid endarterectomy include but are not limited to: bleeding, infection, stroke, myocardial infarction, death, cranial nerve injuries both temporary and permanent, neck hematoma, possible airway compromise, labile blood pressure post-operatively, cerebral hyperperfusion syndrome, and possible need for additional interventions in the future.  The patient is aware of the risks and agrees to proceed forward with the procedure.  I discussed in depth with the patient the nature of   atherosclerosis, and emphasized the importance of maximal medical management including strict control of blood pressure, blood glucose, and lipid levels, obtaining regular exercise, antiplatelet agents, and cessation of smoking.   The patient is currently on a statin: Pravachol.  The patient is currently on an anti-platelet: ASA.  The patient is aware that without maximal medical management the underlying atherosclerotic disease process will progress, limiting the benefit of any interventions.  The patient is tenatively scheduled for the 10 DEC 14.  I will get Dr. Hilty's office to reveal his chart to see if any further preoperative testing is necessary prior to proceeding.  Thank you for allowing us to participate in this patient's care.  Sean Noreen, MD [facility] [place] Office: [phone] Pager: [phone]  11/06/2013, 12:29 PM        

## 2013-11-06 NOTE — Telephone Encounter (Signed)
Wants to know if he can get cardiac clarence or does he need to be seen to be cleared.He is scheduled for a Rt CEA on 11-11-13.Please call back asap.

## 2013-11-06 NOTE — Telephone Encounter (Signed)
Will send a surgical clearance via fax or Dr. Blanchie Dessert inbasket within the hour.

## 2013-11-09 ENCOUNTER — Encounter (HOSPITAL_COMMUNITY)
Admission: RE | Admit: 2013-11-09 | Discharge: 2013-11-09 | Disposition: A | Discharge status: Home / Self Care | Payer: Medicare Other | Source: Ambulatory Visit | Attending: Vascular Surgery | Admitting: Vascular Surgery

## 2013-11-09 ENCOUNTER — Encounter (HOSPITAL_COMMUNITY): Payer: Self-pay

## 2013-11-09 HISTORY — DX: Pneumonia, unspecified organism: J18.9

## 2013-11-09 HISTORY — DX: Unspecified osteoarthritis, unspecified site: M19.90

## 2013-11-09 LAB — COMPREHENSIVE METABOLIC PANEL
ALT: 18 U/L (ref 0–53)
Alkaline Phosphatase: 62 U/L (ref 39–117)
BUN: 15 mg/dL (ref 6–23)
CO2: 27 mEq/L (ref 19–32)
Calcium: 9.2 mg/dL (ref 8.4–10.5)
Chloride: 103 mEq/L (ref 96–112)
Creatinine, Ser: 0.9 mg/dL (ref 0.50–1.35)
GFR calc Af Amer: >90 mL/min (ref >90)
GFR calc non Af Amer: 78 mL/min — ABNORMAL LOW (ref >90)
Glucose, Bld: 83 mg/dL (ref 70–99)
Total Protein: 7.3 g/dL (ref 6.0–8.3)

## 2013-11-09 LAB — URINALYSIS, ROUTINE W REFLEX MICROSCOPIC
Bilirubin Urine: NEGATIVE
Glucose, UA: NEGATIVE mg/dL
Hgb urine dipstick: NEGATIVE
Protein, ur: NEGATIVE mg/dL
Specific Gravity, Urine: 1.012 (ref 1.005–1.030)
Urobilinogen, UA: 0.2 mg/dL (ref 0.0–1.0)

## 2013-11-09 LAB — SURGICAL PCR SCREEN
MRSA, PCR: NEGATIVE
Staphylococcus aureus: NEGATIVE

## 2013-11-09 LAB — PROTIME-INR: Prothrombin Time: 12.9 seconds (ref 11.6–15.2)

## 2013-11-09 LAB — TYPE AND SCREEN
ABO/RH(D): O POS
Antibody Screen: NEGATIVE

## 2013-11-09 LAB — CBC
HCT: 42.9 % (ref 39.0–52.0)
Hemoglobin: 15.1 g/dL (ref 13.0–17.0)
MCH: 31.6 pg (ref 26.0–34.0)
MCHC: 35.2 g/dL (ref 30.0–36.0)
MCV: 89.7 fL (ref 78.0–100.0)
Platelets: 205 10*3/uL (ref 150–400)
RBC: 4.78 MIL/uL (ref 4.22–5.81)
WBC: 6.6 10*3/uL (ref 4.0–10.5)

## 2013-11-09 LAB — ABO/RH: ABO/RH(D): O POS

## 2013-11-09 MED ORDER — VANCOMYCIN HCL IN DEXTROSE 1-5 GM/200ML-% IV SOLN
1000.0000 mg | INTRAVENOUS | Status: AC
  Administered 2013-11-10: 1000 mg via INTRAVENOUS
  Filled 2013-11-09: qty 200

## 2013-11-09 NOTE — Progress Notes (Addendum)
Cardiologist is with [facility]-last office visit requested   Heart cath done about 15 yrs ag  Denies ever having an echo   Stress test in epic from 2000  EKG in epic from 05-11-13  Medical Md is Dr.Bruce Swords  CXR done in Nov 2014 per pt-to request from [facility]

## 2013-11-09 NOTE — Pre-Procedure Instructions (Signed)
Sean Miranda  11/09/2013   Your procedure is scheduled on:  Wed, Dec 10 @ 8:30 AM  Report to [facility] Short Stay Entrance A at 6:30 AM.  Call this number if you have problems the morning of surgery: [phone]   Remember:   Do not eat food or drink liquids after midnight.               Stop taking your Omega 3. No Goody's,BC's,Aleve,Aspirin,Ibuprofen,or any Herbal Medications   Do not wear jewelry  Do not wear lotions, powders, or colognes. You may wear deodorant.  Men may shave face and neck.  Do not bring valuables to the hospital.  [facility] is not responsible                  for any belongings or valuables.               Contacts, dentures or bridgework may not be worn into surgery.  Leave suitcase in the car. After surgery it may be brought to your room.  For patients admitted to the hospital, discharge time is determined by your                treatment team.               Special Instructions: Shower using CHG 2 nights before surgery and the night before surgery.  If you shower the day of surgery use CHG.  Use special wash - you have one bottle of CHG for all showers.  You should use approximately 1/3 of the bottle for each shower.   Please read over the following fact sheets that you were given: Pain Booklet, Coughing and Deep Breathing, Blood Transfusion Information, MRSA Information and Surgical Site Infection Prevention

## 2013-11-10 ENCOUNTER — Encounter (HOSPITAL_COMMUNITY): Payer: Self-pay | Admitting: *Deleted

## 2013-11-10 ENCOUNTER — Inpatient Hospital Stay (HOSPITAL_COMMUNITY)
Admission: RE | Admit: 2013-11-10 | Discharge: 2013-11-12 | DRG: 039 | Disposition: A | Discharge status: Home / Self Care | Payer: Medicare Other | Source: Ambulatory Visit | Attending: Vascular Surgery | Admitting: Vascular Surgery

## 2013-11-10 ENCOUNTER — Encounter (HOSPITAL_COMMUNITY): Payer: Medicare Other | Admitting: Anesthesiology

## 2013-11-10 ENCOUNTER — Encounter: Payer: Medicare Other | Admitting: Vascular Surgery

## 2013-11-10 ENCOUNTER — Telehealth: Payer: Self-pay | Admitting: Vascular Surgery

## 2013-11-10 ENCOUNTER — Inpatient Hospital Stay (HOSPITAL_COMMUNITY): Payer: Medicare Other | Admitting: Anesthesiology

## 2013-11-10 ENCOUNTER — Encounter (HOSPITAL_COMMUNITY)
Admission: RE | Disposition: A | Discharge status: Home / Self Care | Payer: Self-pay | Source: Ambulatory Visit | Attending: Vascular Surgery

## 2013-11-10 ENCOUNTER — Other Ambulatory Visit (HOSPITAL_COMMUNITY): Payer: Medicare Other

## 2013-11-10 DIAGNOSIS — I2510 Atherosclerotic heart disease of native coronary artery without angina pectoris: Secondary | ICD-10-CM | POA: Diagnosis present

## 2013-11-10 DIAGNOSIS — Z87891 Personal history of nicotine dependence: Secondary | ICD-10-CM

## 2013-11-10 DIAGNOSIS — I498 Other specified cardiac arrhythmias: Secondary | ICD-10-CM | POA: Diagnosis present

## 2013-11-10 DIAGNOSIS — Z823 Family history of stroke: Secondary | ICD-10-CM

## 2013-11-10 DIAGNOSIS — I959 Hypotension, unspecified: Secondary | ICD-10-CM | POA: Diagnosis present

## 2013-11-10 DIAGNOSIS — I6529 Occlusion and stenosis of unspecified carotid artery: Secondary | ICD-10-CM

## 2013-11-10 DIAGNOSIS — E785 Hyperlipidemia, unspecified: Secondary | ICD-10-CM | POA: Diagnosis present

## 2013-11-10 DIAGNOSIS — Z01812 Encounter for preprocedural laboratory examination: Secondary | ICD-10-CM

## 2013-11-10 DIAGNOSIS — Z79899 Other long term (current) drug therapy: Secondary | ICD-10-CM

## 2013-11-10 HISTORY — PX: PATCH ANGIOPLASTY: SHX6230

## 2013-11-10 HISTORY — PX: ENDARTERECTOMY: SHX5162

## 2013-11-10 SURGERY — ENDARTERECTOMY, CAROTID
Anesthesia: General | Site: Neck | Laterality: Right

## 2013-11-10 MED ORDER — HEPARIN SODIUM (PORCINE) 1000 UNIT/ML IJ SOLN
INTRAMUSCULAR | Status: DC | PRN
  Administered 2013-11-10: 8000 [IU] via INTRAVENOUS

## 2013-11-10 MED ORDER — PROPOFOL 10 MG/ML IV BOLUS
INTRAVENOUS | Status: DC | PRN
  Administered 2013-11-10: 150 mg via INTRAVENOUS

## 2013-11-10 MED ORDER — ALUM & MAG HYDROXIDE-SIMETH 200-200-20 MG/5ML PO SUSP: 15.0000 mL | ORAL | Status: DC | PRN

## 2013-11-10 MED ORDER — 0.9 % SODIUM CHLORIDE (POUR BTL) OPTIME
TOPICAL | Status: DC | PRN
  Administered 2013-11-10: 1000 mL
  Administered 2013-11-10: 2000 mL

## 2013-11-10 MED ORDER — METOPROLOL TARTRATE 1 MG/ML IV SOLN: 2.0000 mg | INTRAVENOUS | Status: DC | PRN

## 2013-11-10 MED ORDER — DOPAMINE-DEXTROSE 3.2-5 MG/ML-% IV SOLN
INTRAVENOUS | Status: AC
  Filled 2013-11-10: qty 250

## 2013-11-10 MED ORDER — PROMETHAZINE HCL 25 MG/ML IJ SOLN
6.2500 mg | INTRAMUSCULAR | Status: DC | PRN
  Administered 2013-11-10: 6.25 mg via INTRAVENOUS

## 2013-11-10 MED ORDER — ONDANSETRON HCL 4 MG/2ML IJ SOLN
4.0000 mg | Freq: Four times a day (QID) | INTRAMUSCULAR | Status: DC | PRN
  Administered 2013-11-11: 4 mg via INTRAVENOUS
  Filled 2013-11-10: qty 2

## 2013-11-10 MED ORDER — SODIUM CHLORIDE 0.9 % IV SOLN
10.0000 mg | INTRAVENOUS | Status: DC | PRN
  Administered 2013-11-10: 25 ug/min via INTRAVENOUS

## 2013-11-10 MED ORDER — LIDOCAINE HCL (CARDIAC) 20 MG/ML IV SOLN
INTRAVENOUS | Status: DC | PRN
  Administered 2013-11-10: 100 mg via INTRAVENOUS

## 2013-11-10 MED ORDER — OMEGA-3 1000 MG PO CAPS: 4000.0000 mg | ORAL_CAPSULE | Freq: Every day | ORAL | Status: DC

## 2013-11-10 MED ORDER — OXYCODONE HCL 5 MG PO TABS: 5.0000 mg | ORAL_TABLET | Freq: Once | ORAL | Status: DC | PRN

## 2013-11-10 MED ORDER — LIDOCAINE HCL (PF) 1 % IJ SOLN
INTRAMUSCULAR | Status: AC
  Filled 2013-11-10: qty 30

## 2013-11-10 MED ORDER — SODIUM CHLORIDE 0.9 % IV SOLN: INTRAVENOUS | Status: DC

## 2013-11-10 MED ORDER — MAGNESIUM 500 MG PO TABS: 1.0000 | ORAL_TABLET | Freq: Every day | ORAL | Status: DC

## 2013-11-10 MED ORDER — LACTATED RINGERS IV SOLN
INTRAVENOUS | Status: DC | PRN
  Administered 2013-11-10 (×2): via INTRAVENOUS

## 2013-11-10 MED ORDER — MAGNESIUM OXIDE 400 (241.3 MG) MG PO TABS
400.0000 mg | ORAL_TABLET | Freq: Every day | ORAL | Status: DC
  Administered 2013-11-11 – 2013-11-12 (×2): 400 mg via ORAL
  Filled 2013-11-10 (×3): qty 1

## 2013-11-10 MED ORDER — ROCURONIUM BROMIDE 100 MG/10ML IV SOLN
INTRAVENOUS | Status: DC | PRN
  Administered 2013-11-10: 50 mg via INTRAVENOUS

## 2013-11-10 MED ORDER — THROMBIN 20000 UNITS EX SOLR
CUTANEOUS | Status: AC
  Filled 2013-11-10: qty 20000

## 2013-11-10 MED ORDER — ENOXAPARIN SODIUM 30 MG/0.3ML ~~LOC~~ SOLN
30.0000 mg | SUBCUTANEOUS | Status: DC
Start: 2013-11-10 — End: 2013-11-12
  Administered 2013-11-10 – 2013-11-11 (×2): 30 mg via SUBCUTANEOUS
  Filled 2013-11-10 (×3): qty 0.3

## 2013-11-10 MED ORDER — HYDRALAZINE HCL 20 MG/ML IJ SOLN: 10.0000 mg | INTRAMUSCULAR | Status: DC | PRN

## 2013-11-10 MED ORDER — OXYCODONE HCL 5 MG/5ML PO SOLN: 5.0000 mg | Freq: Once | ORAL | Status: DC | PRN

## 2013-11-10 MED ORDER — DEXTRAN 40 IN SALINE 10-0.9 % IV SOLN
INTRAVENOUS | Status: AC
  Filled 2013-11-10: qty 500

## 2013-11-10 MED ORDER — ACETAMINOPHEN 325 MG PO TABS: 325.0000 mg | ORAL_TABLET | ORAL | Status: DC | PRN

## 2013-11-10 MED ORDER — HYDROMORPHONE HCL PF 1 MG/ML IJ SOLN
INTRAMUSCULAR | Status: AC
  Filled 2013-11-10: qty 1

## 2013-11-10 MED ORDER — OMEGA-3-ACID ETHYL ESTERS 1 G PO CAPS
1.0000 g | ORAL_CAPSULE | Freq: Two times a day (BID) | ORAL | Status: DC
  Administered 2013-11-11 – 2013-11-12 (×2): 1 g via ORAL
  Filled 2013-11-10 (×5): qty 1

## 2013-11-10 MED ORDER — MORPHINE SULFATE 2 MG/ML IJ SOLN
2.0000 mg | INTRAMUSCULAR | Status: DC | PRN
  Administered 2013-11-11: 2 mg via INTRAVENOUS
  Filled 2013-11-10: qty 1

## 2013-11-10 MED ORDER — OXYCODONE-ACETAMINOPHEN 5-325 MG PO TABS: 1.0000 | ORAL_TABLET | ORAL | Status: DC | PRN

## 2013-11-10 MED ORDER — SODIUM CHLORIDE 0.9 % IV SOLN
INTRAVENOUS | Status: DC
  Administered 2013-11-10: 100 mL/h via INTRAVENOUS
  Administered 2013-11-10: 22:00:00 via INTRAVENOUS

## 2013-11-10 MED ORDER — ONDANSETRON HCL 4 MG/2ML IJ SOLN
INTRAMUSCULAR | Status: DC | PRN
  Administered 2013-11-10: 4 mg via INTRAVENOUS

## 2013-11-10 MED ORDER — PROTAMINE SULFATE 10 MG/ML IV SOLN
INTRAVENOUS | Status: DC | PRN
  Administered 2013-11-10 (×3): 10 mg via INTRAVENOUS

## 2013-11-10 MED ORDER — ARTIFICIAL TEARS OP OINT
TOPICAL_OINTMENT | OPHTHALMIC | Status: DC | PRN
  Administered 2013-11-10: 1 via OPHTHALMIC

## 2013-11-10 MED ORDER — PHENOL 1.4 % MT LIQD: 1.0000 | OROMUCOSAL | Status: DC | PRN

## 2013-11-10 MED ORDER — ASPIRIN EC 325 MG PO TBEC
325.0000 mg | DELAYED_RELEASE_TABLET | Freq: Every day | ORAL | Status: DC
  Administered 2013-11-10 – 2013-11-12 (×3): 325 mg via ORAL
  Filled 2013-11-10 (×3): qty 1

## 2013-11-10 MED ORDER — DOPAMINE-DEXTROSE 3.2-5 MG/ML-% IV SOLN
3.0000 ug/kg/min | INTRAVENOUS | Status: DC
  Administered 2013-11-10: 3 ug/kg/min via INTRAVENOUS

## 2013-11-10 MED ORDER — DOCUSATE SODIUM 100 MG PO CAPS
100.0000 mg | ORAL_CAPSULE | Freq: Every day | ORAL | Status: DC
  Administered 2013-11-11 – 2013-11-12 (×2): 100 mg via ORAL
  Filled 2013-11-10 (×2): qty 1

## 2013-11-10 MED ORDER — NEOSTIGMINE METHYLSULFATE 1 MG/ML IJ SOLN
INTRAMUSCULAR | Status: DC | PRN
  Administered 2013-11-10: 4 mg via INTRAVENOUS

## 2013-11-10 MED ORDER — POTASSIUM CHLORIDE CRYS ER 20 MEQ PO TBCR: 20.0000 meq | EXTENDED_RELEASE_TABLET | Freq: Once | ORAL | Status: AC | PRN

## 2013-11-10 MED ORDER — COENZYME Q10-LEVOCARNITINE 100-20 MG PO CAPS: 1.0000 | ORAL_CAPSULE | Freq: Every day | ORAL | Status: DC

## 2013-11-10 MED ORDER — ADULT MULTIVITAMIN W/MINERALS CH
1.0000 | ORAL_TABLET | Freq: Every day | ORAL | Status: DC
  Administered 2013-11-12: 1 via ORAL
  Filled 2013-11-10 (×3): qty 1

## 2013-11-10 MED ORDER — ACETAMINOPHEN 650 MG RE SUPP: 325.0000 mg | RECTAL | Status: DC | PRN

## 2013-11-10 MED ORDER — GLYCOPYRROLATE 0.2 MG/ML IJ SOLN
INTRAMUSCULAR | Status: DC | PRN
  Administered 2013-11-10: .8 mg via INTRAVENOUS
  Administered 2013-11-10: .2 mg via INTRAVENOUS

## 2013-11-10 MED ORDER — LABETALOL HCL 5 MG/ML IV SOLN: 10.0000 mg | INTRAVENOUS | Status: DC | PRN

## 2013-11-10 MED ORDER — PROMETHAZINE HCL 25 MG/ML IJ SOLN
INTRAMUSCULAR | Status: AC
  Filled 2013-11-10: qty 1

## 2013-11-10 MED ORDER — SIMVASTATIN 10 MG PO TABS
10.0000 mg | ORAL_TABLET | Freq: Every day | ORAL | Status: DC
  Administered 2013-11-11: 10 mg via ORAL
  Filled 2013-11-10 (×3): qty 1

## 2013-11-10 MED ORDER — THROMBIN 20000 UNITS EX SOLR
CUTANEOUS | Status: DC | PRN
  Administered 2013-11-10: 09:00:00 via TOPICAL

## 2013-11-10 MED ORDER — MAGNESIUM SULFATE 40 MG/ML IJ SOLN
2.0000 g | Freq: Once | INTRAMUSCULAR | Status: AC | PRN
  Filled 2013-11-10: qty 50

## 2013-11-10 MED ORDER — GLUCOSAMINE-CHONDROITIN 500-400 MG PO CAPS: 1.0000 | ORAL_CAPSULE | Freq: Every day | ORAL | Status: DC

## 2013-11-10 MED ORDER — DEXTRAN 40 IN SALINE 10-0.9 % IV SOLN
INTRAVENOUS | Status: DC | PRN
  Administered 2013-11-10: 500 mL

## 2013-11-10 MED ORDER — HYDROMORPHONE HCL PF 1 MG/ML IJ SOLN
0.2500 mg | INTRAMUSCULAR | Status: DC | PRN
  Administered 2013-11-10: 0.5 mg via INTRAVENOUS

## 2013-11-10 MED ORDER — SODIUM CHLORIDE 0.9 % IR SOLN
Status: DC | PRN
  Administered 2013-11-10: 08:00:00

## 2013-11-10 MED ORDER — GUAIFENESIN-DM 100-10 MG/5ML PO SYRP: 15.0000 mL | ORAL_SOLUTION | ORAL | Status: DC | PRN

## 2013-11-10 MED ORDER — DOPAMINE-DEXTROSE 3.2-5 MG/ML-% IV SOLN
3.0000 ug/kg/min | INTRAVENOUS | Status: DC
  Filled 2013-11-10: qty 250

## 2013-11-10 MED ORDER — SODIUM CHLORIDE 0.9 % IV SOLN
500.0000 mL | Freq: Once | INTRAVENOUS | Status: AC | PRN
  Administered 2013-11-10 (×2): 500 mL via INTRAVENOUS

## 2013-11-10 MED ORDER — VANCOMYCIN HCL IN DEXTROSE 1-5 GM/200ML-% IV SOLN
1000.0000 mg | Freq: Two times a day (BID) | INTRAVENOUS | Status: AC
  Administered 2013-11-10 – 2013-11-11 (×2): 1000 mg via INTRAVENOUS
  Filled 2013-11-10 (×2): qty 200

## 2013-11-10 MED ORDER — PANTOPRAZOLE SODIUM 40 MG PO TBEC
40.0000 mg | DELAYED_RELEASE_TABLET | Freq: Every day | ORAL | Status: DC
  Administered 2013-11-10 – 2013-11-12 (×3): 40 mg via ORAL
  Filled 2013-11-10 (×3): qty 1

## 2013-11-10 MED ORDER — FENTANYL CITRATE 0.05 MG/ML IJ SOLN
INTRAMUSCULAR | Status: DC | PRN
  Administered 2013-11-10: 150 ug via INTRAVENOUS
  Administered 2013-11-10: 50 ug via INTRAVENOUS

## 2013-11-10 SURGICAL SUPPLY — 55 items
ADH SKN CLS APL DERMABOND .7 (GAUZE/BANDAGES/DRESSINGS) ×1
ADPR TBG 2 MALE LL ART (MISCELLANEOUS)
BAG DECANTER FOR FLEXI CONT (MISCELLANEOUS) ×2 IMPLANT
CANISTER SUCTION 2500CC (MISCELLANEOUS) ×2 IMPLANT
CATH ROBINSON RED A/P 18FR (CATHETERS) ×2 IMPLANT
CATH SUCT 10FR WHISTLE TIP (CATHETERS) ×2 IMPLANT
CLIP TI MEDIUM 24 (CLIP) ×2 IMPLANT
CLIP TI WIDE RED SMALL 24 (CLIP) ×2 IMPLANT
COVER PROBE W GEL 5X96 (DRAPES) IMPLANT
COVER SURGICAL LIGHT HANDLE (MISCELLANEOUS) ×2 IMPLANT
CRADLE DONUT ADULT HEAD (MISCELLANEOUS) ×1 IMPLANT
DERMABOND ADVANCED (GAUZE/BANDAGES/DRESSINGS) ×1
DERMABOND ADVANCED .7 DNX12 (GAUZE/BANDAGES/DRESSINGS) ×1 IMPLANT
DRAPE WARM FLUID 44X44 (DRAPE) ×2 IMPLANT
ELECT REM PT RETURN 9FT ADLT (ELECTROSURGICAL) ×2
ELECTRODE REM PT RTRN 9FT ADLT (ELECTROSURGICAL) ×1 IMPLANT
GLOVE BIO SURGEON STRL SZ7 (GLOVE) ×2 IMPLANT
GLOVE BIOGEL PI IND STRL 6.5 (GLOVE) IMPLANT
GLOVE BIOGEL PI IND STRL 7.5 (GLOVE) ×1 IMPLANT
GLOVE BIOGEL PI INDICATOR 6.5 (GLOVE) ×1
GLOVE BIOGEL PI INDICATOR 7.5 (GLOVE) ×2
GLOVE SS BIOGEL STRL SZ 7 (GLOVE) IMPLANT
GLOVE SUPERSENSE BIOGEL SZ 7 (GLOVE) ×1
GLOVE SURG SS PI 7.0 STRL IVOR (GLOVE) ×1 IMPLANT
GOWN STRL NON-REIN LRG LVL3 (GOWN DISPOSABLE) ×5 IMPLANT
GOWN STRL REIN XL XLG (GOWN DISPOSABLE) ×1 IMPLANT
IV ADAPTER SYR DOUBLE MALE LL (MISCELLANEOUS) IMPLANT
KIT BASIN OR (CUSTOM PROCEDURE TRAY) ×2 IMPLANT
KIT ROOM TURNOVER OR (KITS) ×2 IMPLANT
KIT SHUNT ARGYLE CAROTID ART 6 (VASCULAR PRODUCTS) ×1 IMPLANT
NS IRRIG 1000ML POUR BTL (IV SOLUTION) ×6 IMPLANT
PACK CAROTID (CUSTOM PROCEDURE TRAY) ×2 IMPLANT
PAD ARMBOARD 7.5X6 YLW CONV (MISCELLANEOUS) ×4 IMPLANT
PATCH VASCULAR VASCU GUARD 1X6 (Vascular Products) ×2 IMPLANT
SET COLLECT BLD 21X3/4 12 PB (MISCELLANEOUS) IMPLANT
SHUNT CAROTID BYPASS 10 (VASCULAR PRODUCTS) ×1 IMPLANT
SHUNT CAROTID BYPASS 12FRX15.5 (VASCULAR PRODUCTS) IMPLANT
SPONGE SURGIFOAM ABS GEL 100 (HEMOSTASIS) IMPLANT
STOPCOCK 4 WAY LG BORE MALE ST (IV SETS) IMPLANT
SUT ETHILON 3 0 PS 1 (SUTURE) IMPLANT
SUT MNCRL AB 4-0 PS2 18 (SUTURE) ×2 IMPLANT
SUT PROLENE 6 0 BV (SUTURE) ×4 IMPLANT
SUT PROLENE 7 0 BV 1 (SUTURE) ×1 IMPLANT
SUT SILK 3 0 TIES 17X18 (SUTURE)
SUT SILK 3-0 18XBRD TIE BLK (SUTURE) IMPLANT
SUT VIC AB 3-0 SH 27 (SUTURE) ×2
SUT VIC AB 3-0 SH 27X BRD (SUTURE) ×1 IMPLANT
SYR 20CC LL (SYRINGE) ×1 IMPLANT
SYR TB 1ML LUER SLIP (SYRINGE) IMPLANT
SYSTEM CHEST DRAIN TLS 7FR (DRAIN) IMPLANT
TOWEL OR 17X24 6PK STRL BLUE (TOWEL DISPOSABLE) ×2 IMPLANT
TOWEL OR 17X26 10 PK STRL BLUE (TOWEL DISPOSABLE) ×2 IMPLANT
TUBING ART PRESS 48 MALE/FEM (TUBING) IMPLANT
TUBING EXTENTION W/L.L. (IV SETS) IMPLANT
WATER STERILE IRR 1000ML POUR (IV SOLUTION) ×2 IMPLANT

## 2013-11-10 NOTE — Interval H&P Note (Signed)
History and Physical Interval Note:  11/10/2013 7:01 AM  Sean Miranda  has presented today for surgery, with the diagnosis of Right Internal Carotid Artery Stenosis  The various methods of treatment have been discussed with the patient and family. After consideration of risks, benefits and other options for treatment, the patient has consented to  Procedure(s): ENDARTERECTOMY CAROTID-RIGHT (Right) as a surgical intervention .  The patient's history has been reviewed, patient examined, no change in status, stable for surgery.  I have reviewed the patient's chart and labs.  Questions were answered to the patient's satisfaction.     Viktor Philipp LIANG-YU

## 2013-11-10 NOTE — Telephone Encounter (Addendum)
Message copied by Fredrich Birks on Tue Nov 10, 2013  2:08 PM ------      Message from: Melene Plan      Created: Tue Nov 10, 2013  9:45 AM                   ----- Message -----         From: Fransisco Hertz, MD         Sent: 11/10/2013   9:38 AM           To: Reuel Derby, Melene Plan, RN            Sean Miranda      [id number]      07-Mar-1930            PROCEDURE:         1.  right carotid endarterectomy with bovine patch angioplasty      2.  right intraoperative carotid ultrasound            Asst: Lianne Cure, [facility]             Follow-up: 2 weeks       ------  11/10/13: lm for pt re appt, dpm

## 2013-11-10 NOTE — H&P (View-Only) (Signed)
[facility] [place]  New Carotid Patient  Referred by:  Lindley Magnus, MD [address] [place], [place] [address]  Reason for referral: R carotid stenosis  History of Present Illness  Sean Miranda is a 79 y.o. (Jun 24, 1930) male who presents with chief complaint: "problem with my artery".  Previous carotid studies demonstrated: RICA 80-99% stenosis, LICA <50% stenosis.  Patient has no history of TIA or stroke symptom.  The patient has never had amaurosis fugax or monocular blindness.  The patient has never had facial drooping or hemiplegia.  The patient has never had receptive or expressive aphasia.   The patient has a family history of CVA, which has lead to surveillance over the last 5 years.  The patient's risks factors for carotid disease include: hyperlipidemia , history of smoking.  Past Medical History  Diagnosis Date  . Hyperlipidemia   . CAD (coronary artery disease)   . Colon polyps     Past Surgical History  Procedure Laterality Date  . Inguinal hernia repair    . Rotator cuff repair Bilateral   . Pilonidal cyst drainage      History   Social History  . Marital Status: Married    Spouse Name: N/A    Number of Children: N/A  . Years of Education: N/A   Occupational History  . Not on file.   Social History Main Topics  . Smoking status: Former Smoker    Types: Cigarettes    Quit date: 12/03/1972  . Smokeless tobacco: Never Used  . Alcohol Use: 7.8 oz/week    7 Glasses of wine, 6 Shots of liquor per week     Comment: wine/scotch occasionally  . Drug Use: No  . Sexual Activity: Not on file   Other Topics Concern  . Not on file   Social History Narrative  . No narrative on file    Family History  Problem Relation Age of Onset  . Cancer Son     prostate  . Stroke Father    Current Outpatient Prescriptions on File Prior to Visit  Medication Sig Dispense Refill  . ALPHA LIPOIC ACID PO Take 300 mg by mouth daily.      .  Cholecalciferol (VITAMIN D3) 2000 UNITS capsule Take 2,000 Units by mouth daily. Take 2,000 Units by mouth daily.      . Coenzyme Q10-Levocarnitine 100-20 MG CAPS Take 1 capsule by mouth daily. Take by mouth.      . Glucosamine-Chondroitin 500-400 MG CAPS Take 1 capsule by mouth daily. Take by mouth.      . Magnesium 500 MG TABS Take 1 tablet by mouth daily.      . Multiple Vitamin (MULTIVITAMIN) tablet Take 0.5 tablets by mouth daily.       . OMEGA-3 1000 MG CAPS Take 4,000 mg by mouth daily.      . Probiotic Product (PROBIOTIC DAILY PO) Take 1 capsule by mouth daily.       No current facility-administered medications on file prior to visit.    Allergies  Allergen Reactions  . Lipitor [Atorvastatin Calcium]     Muscle pain  . Vantin [Cefpodoxime] Rash    REVIEW OF SYSTEMS:  (Positives checked otherwise negative)  CARDIOVASCULAR:  []  chest pain, []  chest pressure, []  palpitations, []  shortness of breath when laying flat, []  shortness of breath with exertion,  []  pain in feet when walking, []  pain in feet when laying flat, []  history of blood clot in  veins (DVT), []  history of phlebitis, []  swelling in legs, []  varicose veins  PULMONARY:  []  productive cough, []  asthma, []  wheezing  NEUROLOGIC:  []  weakness in arms or legs, []  numbness in arms or legs, []  difficulty speaking or slurred speech, []  temporary loss of vision in one eye, []  dizziness  HEMATOLOGIC:  []  bleeding problems, []  problems with blood clotting too easily  MUSCULOSKEL:  []  joint pain, []  joint swelling  GASTROINTEST:  []  vomiting blood, []  blood in stool     GENITOURINARY:  []  burning with urination, []  blood in urine  PSYCHIATRIC:  []  history of major depression  INTEGUMENTARY:  []  rashes, []  ulcers  CONSTITUTIONAL:  []  fever, []  chills  For VQI Use Only  PRE-ADM LIVING: Home  AMB STATUS: Ambulatory  CAD Sx: None  PRIOR CHF: None  STRESS TEST: [x]  No, [ ]  Normal, [ ]  + ischemia, [ ]  + MI, [ ]   Both  Physical Examination  Filed Vitals:   11/06/13 1011 11/06/13 1016  BP: 144/64 107/85  Pulse: 57   Height: 6' (1.829 m)   Weight: 191 lb 6.4 oz (86.818 kg)   SpO2: 100%    Body mass index is 25.95 kg/(m^2).  General: A&O x 3, WD  Head: [place]/AT  Ear/Nose/Throat: Hearing grossly intact, nares w/o erythema or drainage, oropharynx w/o Erythema/Exudate, Mallampati score: 3  Eyes: PERRLA, EOMI  Neck: Supple, no nuchal rigidity, no palpable LAD  Pulmonary: Sym exp, good air movt, CTAB, no rales, rhonchi, & wheezing  Cardiac: RRR, Nl S1, S2, no Murmurs, rubs or gallops  Vascular: Vessel Right Left  Radial Faintly Palpable Palpable  Brachial Faintly Palpable Palpable  Carotid Palpable, without bruit Palpable, without bruit  Aorta Not palpable N/A  Femoral Palpable Palpable  Popliteal Not palpable Not palpable  PT Palpable Palpable  DP Palpable Palpable   Gastrointestinal: soft, NTND, -G/R, - HSM, - masses, - CVAT B, no palpable AAA  Musculoskeletal: M/S 5/5 throughout , Extremities without ischemic changes   Neurologic: CN 2-12 intact , Pain and light touch intact in extremities , Motor exam as listed above  Psychiatric: Judgment intact, Mood & affect appropriate for pt's clinical situation  Dermatologic: See M/S exam for extremity exam, no rashes otherwise noted  Lymph : No Cervical, Axillary, or Inguinal lymphadenopathy   Non-Invasive Vascular Imaging  R CAROTID DUPLEX (Date: 11/06/2013):   R ICA stenosis: 80-99% (394/143 c/s)  R VA:  patent and antegrade  Outside Studies/Documentation 4 pages of outside documents were reviewed including: outside carotid duplex and Dr. Blanchie Dessert cardiology note.  Medical Decision Making  Sean Miranda is a 79 y.o. male who presents with: asx R ICA stenosis >80%.   Based on the patient's vascular studies and examination, I have offered the patient: R CEA.  Data from [facility] demonstrates superiority of CEA over CAS in  patients >70 y/o. I discussed with the patient the risks, benefits, and alternatives to carotid endarterectomy.   I discussed the procedural details of carotid endarterectomy with the patient.   The patient is aware that the risks of carotid endarterectomy include but are not limited to: bleeding, infection, stroke, myocardial infarction, death, cranial nerve injuries both temporary and permanent, neck hematoma, possible airway compromise, labile blood pressure post-operatively, cerebral hyperperfusion syndrome, and possible need for additional interventions in the future.  The patient is aware of the risks and agrees to proceed forward with the procedure.  I discussed in depth with the patient the nature of  atherosclerosis, and emphasized the importance of maximal medical management including strict control of blood pressure, blood glucose, and lipid levels, obtaining regular exercise, antiplatelet agents, and cessation of smoking.   The patient is currently on a statin: Pravachol.  The patient is currently on an anti-platelet: ASA.  The patient is aware that without maximal medical management the underlying atherosclerotic disease process will progress, limiting the benefit of any interventions.  The patient is tenatively scheduled for the 10 DEC 14.  I will get Dr. Blanchie Dessert office to reveal his chart to see if any further preoperative testing is necessary prior to proceeding.  Thank you for allowing [place] to participate in this patient's care.  Leonides Sake, MD [facility] [place] Office: [phone] Pager: [phone]  11/06/2013, 12:29 PM

## 2013-11-10 NOTE — Op Note (Signed)
OPERATIVE NOTE  PROCEDURE:   1.  right carotid endarterectomy with bovine patch angioplasty 2.  right intraoperative carotid ultrasound  PRE-OPERATIVE DIAGNOSIS: right asymptomatic carotid stenosis >80%  POST-OPERATIVE DIAGNOSIS: same as above   SURGEON: Leonides Sake, MD  ASSISTANT(S): Lianne Cure, PAC   ANESTHESIA: general  ESTIMATED BLOOD LOSS: 30 cc  FINDING(S): 1.  Continuous Doppler audible flow signatures are appropriate for each carotid artery. 2.  No evidence of intimal flap visualized on transverse or longitudinal ultrasonography. 3.  Necrotic core carotid plaque.  SPECIMEN(S):  Carotid plaque (sent to Pathology)  INDICATIONS:   Sean Miranda is a 79 y.o. male who presents with right asymptomatic carotid stenosis >80%.  I discussed with the patient the risks, benefits, and alternatives to carotid endarterectomy.  I discussed the procedural details of carotid endarterectomy with the patient.  The patient is aware that the risks of carotid endarterectomy include but are not limited to: bleeding, infection, stroke, myocardial infarction, death, cranial nerve injuries both temporary and permanent, neck hematoma, possible airway compromise, labile blood pressure post-operatively, cerebral hyperperfusion syndrome, and possible need for additional interventions in the future. The patient is aware of the risks and agrees to proceed forward with the procedure.  DESCRIPTION: After full informed written consent was obtained from the patient, the patient was brought back to the operating room and placed supine upon the operating table.  Prior to induction, the patient received IV antibiotics.  After obtaining adequate anesthesia, the patient was placed into semi-Fowler position with a shoulder roll in place and the patient's neck slightly hyperextended and rotated away from the surgical site.  The patient was prepped in the standard fashion for a right carotid endarterectomy.  I made an  incision anterior to the sternocleidomastoid muscle and dissected down through the subcutaneous tissue.  The platysmas was opened with electrocautery.  Then I dissected down to the internal jugular vein.  This was dissected posteriorly until I obtained visualization of the common carotid artery.  This was dissected out and then an umbilical tape was placed around the common carotid artery and I loosely applied a Rumel tourniquet.  I then dissected in a periadventitial fashion along the common carotid artery up to the bifurcation.  I then identified the external carotid artery and the superior thyroid artery.  A 2-0 silk tie was looped around the superior thyroid artery, and I also dissected out the external carotid artery and placed a vessel loop around it.  In continuing the dissection to the internal carotid artery, I identified the facial vein.  This was ligated and then transected, giving me improved exposure of the internal carotid artery.  In the process of this dissection, the hypoglossal nerve was identified.  I then dissected out the internal carotid artery until I identified an area of soft tissue in the internal carotid artery.  I dissected slightly distal to this area, and placed an umbilical tape around the artery and loosely applied a Rumel tourniquet.  At this point, we gave the patient a therapeutic bolus of Heparin intravenously (roughly 80 units/kg).  After waiting 3 minutes, then I clamped the internal carotid artery, external carotid artery and then the common carotid artery.  I then made an arteriotomy in the common carotid artery with a 11 blade, and extended the arteriotomy with a Potts scissor down into the common carotid artery, then I carried the arteriotomy through the bifurcation into the internal carotid artery until I reached an area that was not diseased.  At this point, I took the 10 shunt that previously been prepared and I inserted it into the internal carotid artery.  The Rumel  tourniquet was then applied to this end of the shunt.  I unclamped the shunt to verify retrograde blood flow in the internal carotid artery.  I then placed the other end of the shunt into the common carotid artery after unclamping the artery.  The Rumel was tightened down around the shunt.  At this point, I verified blood flow in the shunt with a continuous doppler.  At this point, I started the endarterectomy in the common carotid artery with a Cytogeneticist and carried this dissection down into the common carotid artery circumferentially.  Then I transected the plaque at a segment where it was adherent.  I then carried this dissection up into the external carotid artery.  The plaque was extracted by unclamping the external carotid artery and everting the artery.  The dissection was then carried into the internal carotid artery, extracting the remaining portion of the carotid plaque.  I passed the plaque off the field as a specimen.  I then spent the next 30 minutes removing intimal flaps and loose debris.  Eventually I reached the point where the residual plaque was densely adherent and any further dissection would compromise the integrity of the wall.  After verifying that there was no more loose intimal flaps or debris, I re-interrogated the entirety of this carotid artery.  At this point, I was satisfied that the minimal remaining disease was densely adherent to the wall and wall integrity was intact.  At this point, I then fashioned a bovine pericardial patch for the geometry of this artery and sewed it in place with two running stitch of 6-0 Prolene, one from each end.  Prior to completing this patch angioplasty, I removed the shunt first from the internal carotid artery, from which there was excellent backbleeding, and clamped it.  Then I removed the shunt from the common carotid artery, from which there was excellent antegrade bleeding, and then clamped it.  At this point, I allowed the external  carotid artery to backbleed, which was excellent.  Then I instilled heparinized saline in this patched artery and then completed the patch angioplasty in the usual fashion.  First, I released the clamp on the external carotid artery, then I released it on the common carotid artery.  After waiting a few seconds, I then released it on the internal carotid artery.  I then interrogated this patient's arteries with the continuous Doppler.  The audible waveforms in each artery were consistent with the expected characteristics for each artery.  The Sonosite probe was then sterilely draped and used to interrogate the carotid artery in both longitudinal and transverse views.  At this point, I washed out the wound, and placed thrombin and Gelfoam throughout.  I also gave the patient 30 mg of protamine to reverse his anticoagulation.   After waiting a few minutes, I removed the thrombin and Gelfoam and washed out the wound.  There was no more active bleeding in the surgical site.   I then reapproximated the platysma muscle with a running stitch of 3-0 Vicryl.  The skin was then reapproximated with a running subcuticular 4-0 Monocryl stitch.  The skin was then cleaned, dried and Dermabond was used to reinforce the skin closure.  The patient woke without any problems, neurologically intact.     COMPLICATIONS: none  CONDITION: stable  Leonides Sake, MD [facility] [place] Office: [phone] Pager: [phone]  11/10/2013, 9:37 AM

## 2013-11-10 NOTE — Anesthesia Postprocedure Evaluation (Signed)
Anesthesia Post Note  Patient: Sean Miranda  Procedure(s) Performed: Procedure(s) (LRB): ENDARTERECTOMY CAROTID (Right) PATCH ANGIOPLASTY (Right)  Anesthesia type: general  Patient location: PACU  Post pain: Pain level controlled  Post assessment: Patient's Cardiovascular Status Stable  Last Vitals:  Filed Vitals:   11/10/13 1034  BP: 97/38  Pulse: 44  Temp:   Resp: 12    Post vital signs: Reviewed and stable  Level of consciousness: sedated  Complications: No apparent anesthesia complications

## 2013-11-10 NOTE — Transfer of Care (Signed)
Immediate Anesthesia Transfer of Care Note  Patient: Sean Miranda  Procedure(s) Performed: Procedure(s): ENDARTERECTOMY CAROTID (Right) PATCH ANGIOPLASTY (Right)  Patient Location: PACU  Anesthesia Type:General  Level of Consciousness: awake, alert  and oriented  Airway & Oxygen Therapy: Patient Spontanous Breathing and Patient connected to nasal cannula oxygen  Post-op Assessment: Report given to PACU RN, Post -op Vital signs reviewed and stable, Patient moving all extremities X 4 and Patient able to stick tongue midline  Post vital signs: Reviewed and stable  Complications: No apparent anesthesia complications

## 2013-11-10 NOTE — Progress Notes (Signed)
ANTIBIOTIC CONSULT NOTE - INITIAL  Pharmacy Consult for vancomcyin Indication: post op prophylaxis  Allergies  Allergen Reactions  . Lipitor [Atorvastatin Calcium]     Muscle pain  . Vantin [Cefpodoxime] Rash    Patient Measurements: Weight: 191 lb 12.8 oz (87 kg) Adjusted Body Weight:   Vital Signs: Temp: 98.2 F (36.8 C) (12/09 1500) Temp src: Oral (12/09 0553) BP: 128/45 mmHg (12/09 1501) Pulse Rate: 58 (12/09 1515) Intake/Output from previous day:   Intake/Output from this shift: Total I/O In: 2500 [I.V.:2500] Out: 150 [Blood:150]  Labs:  Recent Labs  11/09/13 0952  WBC 6.6  HGB 15.1  PLT 205  CREATININE 0.90   The CrCl is unknown because both a height and weight (above a minimum accepted value) are required for this calculation. No results found for this basename: VANCOTROUGH, Leodis Binet, VANCORANDOM, GENTTROUGH, GENTPEAK, GENTRANDOM, TOBRATROUGH, TOBRAPEAK, TOBRARND, AMIKACINPEAK, AMIKACINTROU, AMIKACIN,  in the last 72 hours   Microbiology: Recent Results (from the past 720 hour(s))  SURGICAL PCR SCREEN     Status: None   Collection Time    11/09/13  9:36 AM      Result Value Range Status   MRSA, PCR NEGATIVE  NEGATIVE Final   Staphylococcus aureus NEGATIVE  NEGATIVE Final   Comment:            The Xpert SA Assay (FDA     approved for NASAL specimens     in patients over 9 years of age),     is one component of     a comprehensive surveillance     program.  Test performance has     been validated by [employer] for patients greater     than or equal to 56 year old.     It is not intended     to diagnose infection nor to     guide or monitor treatment.    Medical History: Past Medical History  Diagnosis Date  . Hyperlipidemia     takes Pravastatin every other day  . Colon polyps   . Pneumonia     back in Feb 2014  . Arthritis     neck     Medications:  Scheduled:  . aspirin EC  325 mg Oral Daily  . Coenzyme Q10-Levocarnitine  1  capsule Oral Daily  . [START ON 11/11/2013] docusate sodium  100 mg Oral Daily  . DOPamine      . enoxaparin (LOVENOX) injection  30 mg Subcutaneous Q24H  . Glucosamine-Chondroitin  1 capsule Oral Daily  . HYDROmorphone      . Magnesium  1 tablet Oral Daily  . multivitamin  0.5 tablet Oral Daily  . Omega-3  4,000 mg Oral Daily  . pantoprazole  40 mg Oral Daily  . promethazine      . simvastatin  10 mg Oral q1800  . vancomycin  1,000 mg Intravenous Q12H   Infusions:  . sodium chloride 100 mL/hr (11/10/13 1116)  . DOPamine 3 mcg/kg/min (11/10/13 1600)   Assessment: 79 yo male s/p surgery will be on vancomycin x 2 doses for surgical prophylaxis.  CrCl ~71.  Goal of Therapy:  Vancomycin trough level 15-20 mcg/ml  Plan:  1) Vancomycin 1g iv q12h x 2 doses.  !st dose at 1930. Will sign off  Peni Rupard, Tsz-Yin 11/10/2013,3:48 PM

## 2013-11-10 NOTE — Progress Notes (Signed)
Utilization Review Completed.Eligio Angert T12/08/2013  

## 2013-11-10 NOTE — Preoperative (Signed)
Beta Blockers   Reason not to administer Beta Blockers:Not Applicable 

## 2013-11-10 NOTE — Anesthesia Preprocedure Evaluation (Addendum)
Anesthesia Evaluation  Patient identified by MRN, date of birth, ID band Patient awake    Reviewed: Allergy & Precautions, H&P , NPO status , Patient's Chart, lab work & pertinent test results  Airway Mallampati: II TM Distance: >3 FB Neck ROM: full    Dental  (+) Teeth Intact and Dental Advidsory Given   Pulmonary pneumonia -, former smoker,    Pulmonary exam normal       Cardiovascular + CAD and + Peripheral Vascular Disease     Neuro/Psych negative neurological ROS  negative psych ROS   GI/Hepatic negative GI ROS, Neg liver ROS,   Endo/Other    Renal/GU negative Renal ROS     Musculoskeletal   Abdominal   Peds  Hematology   Anesthesia Other Findings   Reproductive/Obstetrics                         Anesthesia Physical Anesthesia Plan  ASA: III  Anesthesia Plan: General   Post-op Pain Management:    Induction: Intravenous  Airway Management Planned: Oral ETT  Additional Equipment: Arterial line  Intra-op Plan:   Post-operative Plan: Extubation in OR  Informed Consent: I have reviewed the patients History and Physical, chart, labs and discussed the procedure including the risks, benefits and alternatives for the proposed anesthesia with the patient or authorized representative who has indicated his/her understanding and acceptance.   Dental Advisory Given  Plan Discussed with: Anesthesiologist, CRNA and Surgeon  Anesthesia Plan Comments:       Anesthesia Quick Evaluation

## 2013-11-11 LAB — BASIC METABOLIC PANEL
CO2: 23 mEq/L (ref 19–32)
Calcium: 8 mg/dL — ABNORMAL LOW (ref 8.4–10.5)
Chloride: 107 mEq/L (ref 96–112)
Creatinine, Ser: 0.76 mg/dL (ref 0.50–1.35)
GFR calc Af Amer: >90 mL/min (ref >90)
Glucose, Bld: 116 mg/dL — ABNORMAL HIGH (ref 70–99)
Potassium: 3.8 mEq/L (ref 3.5–5.1)
Sodium: 136 mEq/L (ref 135–145)

## 2013-11-11 LAB — CBC
Hemoglobin: 12.6 g/dL — ABNORMAL LOW (ref 13.0–17.0)
MCH: 31 pg (ref 26.0–34.0)
MCV: 89.4 fL (ref 78.0–100.0)
RBC: 4.06 MIL/uL — ABNORMAL LOW (ref 4.22–5.81)
RDW: 13.3 % (ref 11.5–15.5)

## 2013-11-11 NOTE — Progress Notes (Addendum)
[facility]  Subjective  - He is feeling fine and states his BP normally runs on the low side 110 systolic.  He reports feeling sleepy.   Objective 94/36 49 98.2 F (36.8 C) (Oral) 16 95%  Intake/Output Summary (Last 24 hours) at 11/11/13 0738 Last data filed at 11/11/13 0600  Gross per 24 hour  Intake 4792.13 ml  Output   1175 ml  Net 3617.13 ml    Incision C/D/I No facial droop or tongue deviation Grip strength 5/5  Assessment/Planning: POD #1 Right carotid endarterectomy with bovine patch angioplasty Right intraoperative carotid ultrasound  On 3 mcg of Dopamine currently BP 85/37 we will continue to monitor.  He did receive 1000 L bolus and is producing urine well.     Clinton Gallant [facility] 11/11/2013 7:38 AM --  Laboratory Lab Results:  Recent Labs  11/09/13 0952 11/11/13 0435  WBC 6.6 8.0  HGB 15.1 12.6*  HCT 42.9 36.3*  PLT 205 206   BMET  Recent Labs  11/09/13 0952 11/11/13 0435  NA 137 136  K 4.8 3.8  CL 103 107  CO2 27 23  GLUCOSE 83 116*  BUN 15 10  CREATININE 0.90 0.76  CALCIUM 9.2 8.0*    COAG Lab Results  Component Value Date   INR 0.99 11/09/2013   No results found for this basename: PTT      Addendum  I have independently interviewed and examined the patient, and I agree with the physician assistant's findings.  Neuro intact.  Inc c/d/i, without hematoma.  Bradycardia and hypotension likely caroti body mediated.  Wean DA as tolerated.  Leonides Sake, MD [facility] [place] Office: [phone] Pager: [phone]  11/11/2013, 5:00 PM

## 2013-11-12 ENCOUNTER — Encounter (HOSPITAL_COMMUNITY): Payer: Self-pay | Admitting: Vascular Surgery

## 2013-11-12 MED ORDER — OXYCODONE-ACETAMINOPHEN 5-325 MG PO TABS: 1.0000 | ORAL_TABLET | Freq: Four times a day (QID) | ORAL | Status: DC | PRN

## 2013-11-12 NOTE — Progress Notes (Signed)
Pt provided with discharge instructions (pt verbalized understanding and signed copies) and script. VSS. PIV removed, no signs of infection at site.

## 2013-11-12 NOTE — Progress Notes (Signed)
   Daily Progress Note  Assessment/Planning: POD #2 s/p R CEA   Off DA drip since 2301 last night  Neuro intact  D/C home if BP stable at noon  Subjective  - 2 Days Post-Op  No complaints  Objective Filed Vitals:   11/12/13 0000 11/12/13 0100 11/12/13 0400 11/12/13 0700  BP: 117/41 110/54 116/51   Pulse: 51 47 48   Temp: 99.3 F (37.4 C)  98.8 F (37.1 C) 98 F (36.7 C)  TempSrc: Oral  Oral Oral  Resp: 15 17 14    Height:      Weight:      SpO2: 92% 91% 95%     Intake/Output Summary (Last 24 hours) at 11/12/13 0747 Last data filed at 11/12/13 0000  Gross per 24 hour  Intake 1343.73 ml  Output    800 ml  Net 543.73 ml    PULM  CTAB CV  RRR GI  soft, NTND VASC  R neck inc c/d/i, no hematoma NEURO intact motor, midline tongue  Laboratory CBC    Component Value Date/Time   WBC 8.0 11/11/2013 0435   HGB 12.6* 11/11/2013 0435   HCT 36.3* 11/11/2013 0435   PLT 206 11/11/2013 0435    BMET    Component Value Date/Time   NA 136 11/11/2013 0435   K 3.8 11/11/2013 0435   CL 107 11/11/2013 0435   CO2 23 11/11/2013 0435   GLUCOSE 116* 11/11/2013 0435   BUN 10 11/11/2013 0435   CREATININE 0.76 11/11/2013 0435   CALCIUM 8.0* 11/11/2013 0435   GFRNONAA 83* 11/11/2013 0435   GFRAA >90 11/11/2013 0435    Leonides Sake, MD [facility] [place] Office: [phone] Pager: [phone]  11/12/2013, 7:47 AM

## 2013-11-16 NOTE — Discharge Summary (Signed)
[facility] Discharge Summary   Patient ID:  Sean Miranda MRN: [id number] DOB/AGE: 1930/04/30 79 y.o.  Admit date: 11/10/2013 Discharge date: 11/16/2013 Date of Surgery: 11/10/2013 Surgeon: Surgeon(s): Fransisco Hertz, MD  Admission Diagnosis: Right Internal Carotid Artery Stenosis  Discharge Diagnoses:  Right Internal Carotid Artery Stenosis  Secondary Diagnoses: Past Medical History  Diagnosis Date  . Hyperlipidemia     takes Pravastatin every other day  . Colon polyps   . Pneumonia     back in Feb 2014  . Arthritis     neck     Procedure(s): ENDARTERECTOMY CAROTID PATCH ANGIOPLASTY  Discharged Condition: good  HPI: 79 y.o. (01/11/1930) male who presents with chief complaint: "problem with my artery". Previous carotid studies demonstrated: RICA 80-99% stenosis, LICA <50% stenosis. Patient has no history of TIA or stroke symptom. The patient has never had amaurosis fugax or monocular blindness. The patient has never had facial drooping or hemiplegia. The patient has never had receptive or expressive aphasia. The patient has a family history of CVA, which has lead to surveillance over the last 5 years. The patient's risks factors for carotid disease include: hyperlipidemia , history of smoking.   He underwent right carotid endarterectomy with bovine patch angioplasty and right intraoperative carotid ultrasound.  He was admitted to 3300 step down unit over night.  He is feeling fine and states his BP normally runs on the low side 110 systolic. He reports feeling sleepy.  He was kept on 3 mcg of Dopamine currently BP 85/37 we will continue to monitor. He did receive 1000 L bolus and is producing urine well.  Post-op day 2 his blood pressure was stable and he was weaned off the Dopamine.  Ambulating, eating and voiding independently.  He was discharged home in stable condition.   Hospital Course:  Sean Miranda is a 79 y.o. male is S/P  Right Procedure(s): ENDARTERECTOMY CAROTID PATCH ANGIOPLASTY Extubated: POD # 0 Physical exam: PULM CTAB  CV RRR  GI soft, NTND  VASC R neck inc c/d/i, no hematoma  NEURO intact motor, midline tongue Pt. Ambulating, voiding and taking PO diet without difficulty. Pt pain controlled with PO pain meds. Labs as below Complications:Hypotention see HPI  Consults:     Significant Diagnostic Studies: CBC Lab Results  Component Value Date   WBC 8.0 11/11/2013   HGB 12.6* 11/11/2013   HCT 36.3* 11/11/2013   MCV 89.4 11/11/2013   PLT 206 11/11/2013    BMET    Component Value Date/Time   NA 136 11/11/2013 0435   K 3.8 11/11/2013 0435   CL 107 11/11/2013 0435   CO2 23 11/11/2013 0435   GLUCOSE 116* 11/11/2013 0435   BUN 10 11/11/2013 0435   CREATININE 0.76 11/11/2013 0435   CALCIUM 8.0* 11/11/2013 0435   GFRNONAA 83* 11/11/2013 0435   GFRAA >90 11/11/2013 0435   COAG Lab Results  Component Value Date   INR 0.99 11/09/2013     Disposition:  Discharge to :Home Discharge Orders   Future Appointments Provider Department Dept Phone   11/27/2013 8:30 AM Fransisco Hertz, MD [facility] [phone]   Future Orders Complete By Expires   Call MD for:  redness, tenderness, or signs of infection (pain, swelling, bleeding, redness, odor or green/yellow discharge around incision site)  As directed    Call MD for:  severe or increased pain, loss or decreased feeling  in affected limb(s)  As directed  Call MD for:  temperature >100.5  As directed    Discharge patient  As directed    Comments:     Discharge pt to home   Driving Restrictions  As directed    Comments:     No driving for 2 weeks   Increase activity slowly  As directed    Comments:     Walk with assistance use walker or cane as needed   Lifting restrictions  As directed    Comments:     No lifting for 6 weeks   May shower   As directed    Resume previous diet  As directed         Medication List         ALPHA LIPOIC ACID PO  Take 300 mg by mouth daily.     Coenzyme Q10-Levocarnitine 100-20 MG Caps  Take 1 capsule by mouth daily. Take by mouth.     Glucosamine-Chondroitin 500-400 MG Caps  Take 1 capsule by mouth daily. Take by mouth.     Magnesium 500 MG Tabs  Take 1 tablet by mouth daily.     multivitamin tablet  Take 0.5 tablets by mouth daily.     Omega-3 1000 MG Caps  Take 4,000 mg by mouth daily.     oxyCODONE-acetaminophen 5-325 MG per tablet  Commonly known as:  PERCOCET/ROXICET  Take 1 tablet by mouth every 6 (six) hours as needed for moderate pain.     pravastatin 20 MG tablet  Commonly known as:  PRAVACHOL  Take 20 mg by mouth every other day.     PROBIOTIC DAILY PO  Take 1 capsule by mouth daily.     Vitamin D3 2000 UNITS capsule  Take 2,000 Units by mouth daily. Take 2,000 Units by mouth daily.       Verbal and written Discharge instructions given to the patient. Wound care per Discharge AVS   Signed: Clinton Gallant [facility] 11/16/2013, 8:34 AM   Addendum  I have independently interviewed and examined the patient, and I agree with the physician assistant's discharge summary.  This patient underwent a R CEA for asx RICA stenosis >80%.  This post-operative recovery was appropriate except for carotid body mediated bradycardia and hypotension.  This required an additional day of hospitalization to wean the patient off of Dopamine drip which was started to mitigate the carotid body mediated hypotension.  He was discharge with neurologic status intact with intact surgical incision.  He will follow in the office in two weeks.  Leonides Sake, MD [facility] [place] Office: [phone] Pager: [phone]  11/16/2013, 9:25 AM   --- For VQI Registry use --- Instructions: Press F2 to tab through selections.  Delete question if not applicable.   Modified Rankin score at D/C (0-6): Rankin Score=0  IV medication  needed for:  1. Hypertension: No 2. Hypotension: Yes  Post-op Complications: No  1. Post-op CVA or TIA: No  If yes: Event classification (right eye, left eye, right cortical, left cortical, verterobasilar, other):   If yes: Timing of event (intra-op, <6 hrs post-op, >=6 hrs post-op, unknown):   2. CN injury: No  If yes: CN  injuried   3. Myocardial infarction: No  If yes: Dx by (EKG or clinical, Troponin):   4.  CHF: No  5.  Dysrhythmia (new): No  6. Wound infection: No  7. Reperfusion symptoms: No  8. Return to OR: No  If yes: return to OR for (bleeding, neurologic,  other CEA incision, other):   Discharge medications: Statin use:  Yes ASA use:  No  for medical reason   Beta blocker use:  No  for medical reason   ACE-Inhibitor use:  No  for medical reason   P2Y12 Antagonist use: [x ] None, [ ]  Plavix, [ ]  Plasugrel, [ ]  Ticlopinine, [ ]  Ticagrelor, [ ]  Other, [ ]  No for medical reason, [ ]  Non-compliant, [ ]  Not-indicated Anti-coagulant use:  [x ] None, [ ]  Warfarin, [ ]  Rivaroxaban, [ ]  Dabigatran, [ ]  Other, [ ]  No for medical reason, [ ]  Non-compliant, [ ]  Not-indicated

## 2013-11-19 ENCOUNTER — Encounter: Payer: Self-pay | Admitting: Vascular Surgery

## 2013-11-20 ENCOUNTER — Ambulatory Visit (INDEPENDENT_AMBULATORY_CARE_PROVIDER_SITE_OTHER): Payer: Self-pay | Admitting: Vascular Surgery

## 2013-11-20 ENCOUNTER — Encounter: Payer: Self-pay | Admitting: Vascular Surgery

## 2013-11-20 DIAGNOSIS — I6529 Occlusion and stenosis of unspecified carotid artery: Secondary | ICD-10-CM

## 2013-11-20 NOTE — Addendum Note (Signed)
Addended by: Sharee Pimple on: 11/20/2013 01:48 PM   Modules accepted: Orders

## 2013-11-20 NOTE — Progress Notes (Signed)
[facility] [place]  Postoperative Visit  History of Present Illness  Sean Miranda is a 79 y.o. male who presents for postoperative follow-up for: R CEA (Date: 11/10/13).  The patient's neck incision is healed.  The patient has had no stroke or TIA symptoms.  For VQI Use Only  PRE-ADM LIVING: Home  AMB STATUS: Ambulatory  History   Social History  . Marital Status: Married    Spouse Name: N/A    Number of Children: N/A  . Years of Education: N/A   Occupational History  . Not on file.   Social History Main Topics  . Smoking status: Former Smoker    Types: Cigarettes    Quit date: 12/03/1972  . Smokeless tobacco: Never Used  . Alcohol Use: 4.2 oz/week    7 Glasses of wine per week     Comment: wine nightly  . Drug Use: No  . Sexual Activity: Not on file   Other Topics Concern  . Not on file   Social History Narrative  . No narrative on file    Current Outpatient Prescriptions on File Prior to Visit  Medication Sig Dispense Refill  . ALPHA LIPOIC ACID PO Take 300 mg by mouth daily.      . Cholecalciferol (VITAMIN D3) 2000 UNITS capsule Take 2,000 Units by mouth daily. Take 2,000 Units by mouth daily.      . Coenzyme Q10-Levocarnitine 100-20 MG CAPS Take 1 capsule by mouth daily. Take by mouth.      . Glucosamine-Chondroitin 500-400 MG CAPS Take 1 capsule by mouth daily. Take by mouth.      . Magnesium 500 MG TABS Take 1 tablet by mouth daily.      . Multiple Vitamin (MULTIVITAMIN) tablet Take 0.5 tablets by mouth daily.       . OMEGA-3 1000 MG CAPS Take 4,000 mg by mouth daily.      Marland Kitchen oxyCODONE-acetaminophen (PERCOCET/ROXICET) 5-325 MG per tablet Take 1 tablet by mouth every 6 (six) hours as needed for moderate pain.  30 tablet  0  . pravastatin (PRAVACHOL) 20 MG tablet Take 20 mg by mouth every other day.      . Probiotic Product (PROBIOTIC DAILY PO) Take 1 capsule by mouth daily.       No current facility-administered medications on file  prior to visit.    Physical Examination  Filed Vitals:   11/20/13 1222  BP: 134/66  Pulse:     R Neck: Incision is healed Neuro: CN 2-12 are intact , Motor strength is 5/5  bilaterally, sensation is grossly intact  Medical Decision Making  Sean Miranda is a 79 y.o. male who presents s/p R CEA.  The patient's neck incision is healing with no stroke symptoms. I discussed in depth with the patient the nature of atherosclerosis, and emphasized the importance of maximal medical management including strict control of blood pressure, blood glucose, and lipid levels, obtaining regular exercise, anti-platelet use and cessation of smoking.   The patient is currently on an antiplatelet: ASA. The patient is currently on a statin: Pravachol The patient is aware that without maximal medical management the underlying atherosclerotic disease process will progress, limiting the benefit of any interventions. The patient's surveillance will included routine carotid duplex studies which will be completed in: 3 months, at which time the patient will be re-evaluated.   I emphasized the importance of routine surveillance of the carotid arteries as recurrence of stenosis is possible, especially with  proper management of underlying atherosclerotic disease. The patient agrees to participate in their maximal medical care and routine surveillance.  Thank you for allowing [place] to participate in this patient's care.  Leonides Sake, MD [facility] [place] Office: [phone] Pager: [phone]

## 2013-11-23 ENCOUNTER — Other Ambulatory Visit: Payer: Self-pay | Admitting: Internal Medicine

## 2013-11-24 ENCOUNTER — Encounter: Payer: Self-pay | Admitting: *Deleted

## 2013-11-27 ENCOUNTER — Encounter: Payer: Medicare Other | Admitting: Vascular Surgery

## 2014-01-01 ENCOUNTER — Telehealth: Payer: Self-pay | Admitting: Internal Medicine

## 2014-01-01 DIAGNOSIS — E785 Hyperlipidemia, unspecified: Secondary | ICD-10-CM

## 2014-01-01 NOTE — Telephone Encounter (Signed)
Notes advised pt to return for liver and lipid in 2 mos. Can you put the orders in? Pt has lab appt 2/3

## 2014-01-01 NOTE — Telephone Encounter (Signed)
Future orders placed 

## 2014-01-05 ENCOUNTER — Other Ambulatory Visit (INDEPENDENT_AMBULATORY_CARE_PROVIDER_SITE_OTHER): Payer: Medicare Other

## 2014-01-05 DIAGNOSIS — E785 Hyperlipidemia, unspecified: Secondary | ICD-10-CM

## 2014-01-05 LAB — HEPATIC FUNCTION PANEL
ALBUMIN: 3.9 g/dL (ref 3.5–5.2)
ALT: 18 U/L (ref 0–53)
AST: 20 U/L (ref 0–37)
Alkaline Phosphatase: 51 U/L (ref 39–117)
BILIRUBIN TOTAL: 1.1 mg/dL (ref 0.3–1.2)
Bilirubin, Direct: 0.1 mg/dL (ref 0.0–0.3)
Total Protein: 7.1 g/dL (ref 6.0–8.3)

## 2014-01-05 LAB — LIPID PANEL
CHOL/HDL RATIO: 5
CHOLESTEROL: 205 mg/dL — AB (ref 0–200)
HDL: 39 mg/dL — AB (ref >39.00)
Triglycerides: 73 mg/dL (ref 0.0–149.0)
VLDL: 14.6 mg/dL (ref 0.0–40.0)

## 2014-01-05 LAB — LDL CHOLESTEROL, DIRECT: Direct LDL: 149.3 mg/dL

## 2014-01-08 ENCOUNTER — Telehealth: Payer: Self-pay | Admitting: Internal Medicine

## 2014-01-08 NOTE — Telephone Encounter (Signed)
Pt would like blood work results °

## 2014-01-08 NOTE — Telephone Encounter (Signed)
Wanted to see how he should eat while he was out of the country. Pt will be traveling on Monday.

## 2014-01-10 NOTE — Telephone Encounter (Signed)
Cholesterol is too high He is unable to tolerate statins  Recommendation would be to avoid all animal products. Only eat vegetablees and fruit and legumes

## 2014-01-11 NOTE — Telephone Encounter (Signed)
Left message for pt to call back  °

## 2014-01-13 NOTE — Telephone Encounter (Signed)
See result note.  

## 2014-05-17 ENCOUNTER — Other Ambulatory Visit (INDEPENDENT_AMBULATORY_CARE_PROVIDER_SITE_OTHER): Payer: Medicare Other

## 2014-05-17 DIAGNOSIS — Z125 Encounter for screening for malignant neoplasm of prostate: Secondary | ICD-10-CM

## 2014-05-17 DIAGNOSIS — Z0000 Encounter for general adult medical examination without abnormal findings: Secondary | ICD-10-CM

## 2014-05-17 DIAGNOSIS — I2510 Atherosclerotic heart disease of native coronary artery without angina pectoris: Secondary | ICD-10-CM

## 2014-05-17 DIAGNOSIS — E785 Hyperlipidemia, unspecified: Secondary | ICD-10-CM

## 2014-05-17 LAB — POCT URINALYSIS DIPSTICK
Bilirubin, UA: NEGATIVE
Blood, UA: NEGATIVE
Glucose, UA: NEGATIVE
KETONES UA: NEGATIVE
Leukocytes, UA: NEGATIVE
Nitrite, UA: NEGATIVE
PH UA: 7.5
PROTEIN UA: NEGATIVE
Spec Grav, UA: 1.015
UROBILINOGEN UA: 0.2

## 2014-05-17 LAB — HEPATIC FUNCTION PANEL
ALT: 15 U/L (ref 0–53)
AST: 19 U/L (ref 0–37)
Albumin: 3.9 g/dL (ref 3.5–5.2)
Alkaline Phosphatase: 49 U/L (ref 39–117)
BILIRUBIN DIRECT: 0.1 mg/dL (ref 0.0–0.3)
BILIRUBIN TOTAL: 0.6 mg/dL (ref 0.2–1.2)
Total Protein: 6.5 g/dL (ref 6.0–8.3)

## 2014-05-17 LAB — CBC WITH DIFFERENTIAL/PLATELET
BASOS ABS: 0 10*3/uL (ref 0.0–0.1)
BASOS PCT: 0.3 % (ref 0.0–3.0)
EOS ABS: 0.2 10*3/uL (ref 0.0–0.7)
Eosinophils Relative: 2.7 % (ref 0.0–5.0)
HCT: 37.8 % — ABNORMAL LOW (ref 39.0–52.0)
Hemoglobin: 13 g/dL (ref 13.0–17.0)
Lymphocytes Relative: 35.6 % (ref 12.0–46.0)
Lymphs Abs: 2.1 10*3/uL (ref 0.7–4.0)
MCHC: 34.3 g/dL (ref 30.0–36.0)
MCV: 92 fl (ref 78.0–100.0)
MONO ABS: 0.5 10*3/uL (ref 0.1–1.0)
Monocytes Relative: 9.1 % (ref 3.0–12.0)
NEUTROS PCT: 52.3 % (ref 43.0–77.0)
Neutro Abs: 3.1 10*3/uL (ref 1.4–7.7)
PLATELETS: 234 10*3/uL (ref 150.0–400.0)
RBC: 4.11 Mil/uL — AB (ref 4.22–5.81)
RDW: 13.6 % (ref 11.5–15.5)
WBC: 5.9 10*3/uL (ref 4.0–10.5)

## 2014-05-17 LAB — BASIC METABOLIC PANEL
BUN: 12 mg/dL (ref 6–23)
CALCIUM: 9 mg/dL (ref 8.4–10.5)
CO2: 28 meq/L (ref 19–32)
Chloride: 101 mEq/L (ref 96–112)
Creatinine, Ser: 1 mg/dL (ref 0.4–1.5)
GFR: 75.96 mL/min (ref >60.00)
GLUCOSE: 95 mg/dL (ref 70–99)
Potassium: 4.4 mEq/L (ref 3.5–5.1)
Sodium: 136 mEq/L (ref 135–145)

## 2014-05-17 LAB — LIPID PANEL
CHOL/HDL RATIO: 5
Cholesterol: 222 mg/dL — ABNORMAL HIGH (ref 0–200)
HDL: 43.6 mg/dL (ref >39.00)
LDL CALC: 162 mg/dL — AB (ref 0–99)
NonHDL: 178.4
Triglycerides: 82 mg/dL (ref 0.0–149.0)
VLDL: 16.4 mg/dL (ref 0.0–40.0)

## 2014-05-17 LAB — PSA: PSA: 0.47 ng/mL (ref 0.10–4.00)

## 2014-05-17 LAB — TSH: TSH: 1.52 u[IU]/mL (ref 0.35–4.50)

## 2014-05-19 ENCOUNTER — Encounter: Payer: Self-pay | Admitting: *Deleted

## 2014-05-19 ENCOUNTER — Encounter: Payer: Self-pay | Admitting: Cardiology

## 2014-05-24 ENCOUNTER — Ambulatory Visit (INDEPENDENT_AMBULATORY_CARE_PROVIDER_SITE_OTHER): Payer: Medicare Other | Admitting: Internal Medicine

## 2014-05-24 ENCOUNTER — Encounter: Payer: Self-pay | Admitting: Internal Medicine

## 2014-05-24 VITALS — BP 132/64 | HR 60 | Ht 72.0 in | Wt 192.2 lb

## 2014-05-24 DIAGNOSIS — E785 Hyperlipidemia, unspecified: Secondary | ICD-10-CM

## 2014-05-24 DIAGNOSIS — I10 Essential (primary) hypertension: Secondary | ICD-10-CM

## 2014-05-24 DIAGNOSIS — R079 Chest pain, unspecified: Secondary | ICD-10-CM

## 2014-05-24 DIAGNOSIS — I2510 Atherosclerotic heart disease of native coronary artery without angina pectoris: Secondary | ICD-10-CM

## 2014-05-24 DIAGNOSIS — I6529 Occlusion and stenosis of unspecified carotid artery: Secondary | ICD-10-CM

## 2014-05-24 DIAGNOSIS — R0602 Shortness of breath: Secondary | ICD-10-CM

## 2014-05-24 MED ORDER — PRAVASTATIN SODIUM 40 MG PO TABS: ORAL_TABLET | ORAL | Status: DC

## 2014-05-24 NOTE — Patient Instructions (Signed)
Your physician has requested that you have en exercise stress myoview. For further information please visit [email or web address]. Please follow instruction sheet, as given.  Your physician has recommended you make the following change in your medication: START pravastatin 40mg  once daily.   Your physician recommends that you return for lab work in: 3 months (fasting)  Your physician recommends that you schedule a follow-up appointment in: 3 months (after your cholesterol test)

## 2014-05-26 ENCOUNTER — Encounter: Payer: Self-pay | Admitting: Internal Medicine

## 2014-05-26 ENCOUNTER — Ambulatory Visit: Payer: Medicare Other | Admitting: Internal Medicine

## 2014-05-26 ENCOUNTER — Ambulatory Visit (INDEPENDENT_AMBULATORY_CARE_PROVIDER_SITE_OTHER): Payer: Medicare Other | Admitting: Internal Medicine

## 2014-05-26 VITALS — BP 135/75 | HR 68 | Temp 97.6°F | Ht 72.0 in | Wt 191.0 lb

## 2014-05-26 DIAGNOSIS — I2510 Atherosclerotic heart disease of native coronary artery without angina pectoris: Secondary | ICD-10-CM

## 2014-05-26 DIAGNOSIS — I6529 Occlusion and stenosis of unspecified carotid artery: Secondary | ICD-10-CM

## 2014-05-26 DIAGNOSIS — Z0000 Encounter for general adult medical examination without abnormal findings: Secondary | ICD-10-CM

## 2014-05-26 DIAGNOSIS — E785 Hyperlipidemia, unspecified: Secondary | ICD-10-CM

## 2014-05-26 NOTE — Progress Notes (Signed)
Pre visit review using our clinic review tool, if applicable. No additional management support is needed unless otherwise documented below in the visit note. 

## 2014-05-26 NOTE — Progress Notes (Signed)
cpx  Past Medical History  Diagnosis Date  . Hyperlipidemia     takes Pravastatin every other day  . Colon polyps   . Pneumonia     back in Feb 2014  . Arthritis     neck   . Calcification of coronary artery   . Hypertension     History   Social History  . Marital Status: Married    Spouse Name: N/A    Number of Children: 3  . Years of Education: N/A   Occupational History  .     Social History Main Topics  . Smoking status: Former Smoker    Types: Cigarettes    Quit date: 12/03/1972  . Smokeless tobacco: Never Used  . Alcohol Use: 4.2 oz/week    7 Glasses of wine per week     Comment: wine nightly  . Drug Use: No  . Sexual Activity: Not on file   Other Topics Concern  . Not on file   Social History Narrative  . No narrative on file    Past Surgical History  Procedure Laterality Date  . Inguinal hernia repair    . Rotator cuff repair Bilateral 1998, 2001  . Pilonidal cyst drainage    . Tonsillectomy    . Colonoscopy    . Endarterectomy Right 11/10/2013    Procedure: ENDARTERECTOMY CAROTID;  Surgeon: Conrad [place], MD;  Location: [place];  Service: Vascular;  Laterality: Right;  . Patch angioplasty Right 11/10/2013    Procedure: PATCH ANGIOPLASTY;  Surgeon: Conrad [place], MD;  Location: [place];  Service: Vascular;  Laterality: Right;  . Nm myocar perf wall motion  04/2011    bruce myoview - perfusion defect in inferior myocardium (diaphragmatic attenuation), remaining myocardium with normal perfusion, EF 67%  . Cardiac catheterization  06/09/1999    mild CAD (LAD, diagonal, RCA) - Dr. Loni Muse. [place]  . Carotid doppler  02/2012    60-79% RICA stenosis, 99-37% LICA stenosis (prior to carotid endarterectomy)    Family History  Problem Relation Age of Onset  . Prostate cancer Son   . Stroke Father   . Stroke Brother   . Cancer Brother     spine    Allergies  Allergen Reactions  . Lipitor [Atorvastatin Calcium]     Muscle pain  . Vantin [Cefpodoxime] Rash     Current Outpatient Prescriptions on File Prior to Visit  Medication Sig Dispense Refill  . ALPHA LIPOIC ACID PO Take 300 mg by mouth daily.      . Cholecalciferol (VITAMIN D3) 2000 UNITS capsule Take 2,000 Units by mouth daily. Take 2,000 Units by mouth daily.      . Coenzyme Q10-Levocarnitine 100-20 MG CAPS Take 1 capsule by mouth daily. Take by mouth.      . Glucosamine-Chondroitin 500-400 MG CAPS Take 1 capsule by mouth daily. Take by mouth.      . Magnesium 500 MG TABS Take 1 tablet by mouth daily.      . Multiple Vitamin (MULTIVITAMIN) tablet Take 0.5 tablets by mouth daily.       . OMEGA-3 1000 MG CAPS Take 4,000 mg by mouth daily.      Marland Kitchen oxyCODONE-acetaminophen (PERCOCET/ROXICET) 5-325 MG per tablet Take 1 tablet by mouth every 6 (six) hours as needed for moderate pain.  30 tablet  0  . pravastatin (PRAVACHOL) 40 MG tablet TAKE 1TABLET BY MOUTH AT BEDTIME.  30 tablet  5  . Probiotic Product (PROBIOTIC DAILY PO) Take  1 capsule by mouth daily.      Marland Kitchen triamcinolone cream (KENALOG) 0.1 % as needed.       No current facility-administered medications on file prior to visit.     patient denies chest pain, shortness of breath, orthopnea. Denies lower extremity edema, abdominal pain, change in appetite, change in bowel movements. Patient denies rashes, musculoskeletal complaints. No other specific complaints in a complete review of systems.   Vitals reviewed  well-developed well-nourished male in no acute distress. HEENT exam atraumatic, normocephalic, neck supple without jugular venous distention. Chest clear to auscultation cardiac exam S1-S2 are regular. Abdominal exam overweight with bowel sounds, soft and nontender. Extremities no edema. Neurologic exam is alert with a normal gait.   Well visit- health maint utd  Lipids- he is noncompliant with meds- he has agreed to take pravastatin

## 2014-05-27 ENCOUNTER — Encounter: Payer: Self-pay | Admitting: Family

## 2014-05-27 ENCOUNTER — Encounter: Payer: Self-pay | Admitting: Internal Medicine

## 2014-05-27 DIAGNOSIS — I10 Essential (primary) hypertension: Secondary | ICD-10-CM | POA: Insufficient documentation

## 2014-05-27 DIAGNOSIS — R079 Chest pain, unspecified: Secondary | ICD-10-CM | POA: Insufficient documentation

## 2014-05-27 NOTE — Progress Notes (Signed)
OFFICE NOTE  Chief Complaint:  Routine followup  Primary Care Physician: Chancy Hurter, MD  HPI:  Sean Miranda is a pleasant 80 yo male, previously followed by Dr., Rex Kras, who has calcification of his coronary arteries documented by catheterization in 2009, but no occlusive disease. He had a nuclear study May 2012 that was a low risk and a 67% ejection fraction. He has known carotid disease, had Doppler studies done through [employer] that shows less than 80% on the right and less than 60% on the left, which is an increase from his Doppler study done a year previously, and he is currently supposedly scheduled for repeat study in 6 months. He has had no episodes of angina, no unusual shortness of breath, no awareness of any arrhythmias. He has had no ankle edema. He denies dizziness, lightheadedness, unilateral weakness, slurred speech or blurred vision. He checks his blood pressure regularly at home. It has been under good control. He has no unusual shortness of breath and has had no changes in exercise tolerance or stamina. He had had significant myalgias from Lipitor in the past, and I am a little concerned he may develop recurrent myalgias from the daily dose of Pravachol but I see no reason we cannot try this in view of his lipids which now show an LDL of 142 with an HDL of 51.  Unfortunately he was intolerant of Pravachol and other statins, is not currently taking them. He recently had repeat carotid Dopplers which showed high grade velocities in the right carotid and the upper end of the range of 79%. His carotids are followed that with our imaging and Dr. Leanne Chang is coordinating that.  Sean Miranda returns today and is doing fairly well. He does report some shortness of breath with exertion and some chest tightness which improves after exercise.  The chest tightness is fairly predictable with exercise and relieved by rest. This is concerning for possible angina. He  reports he discontinued his pravastatin about one year ago due to problems with leg cramps. He's also had side effects with Lipitor. Recent cholesterol profile showed total cholesterol 222, triglycerides 82, HDL 43 and LDL 162.  He's also had progression of his right internal carotid artery stenosis and underwent right carotid endarterectomy for greater than 80% stenosis of that artery. He does have moderate stenosis of the left internal carotid.  PMHx:  Past Medical History  Diagnosis Date  . Hyperlipidemia     takes Pravastatin every other day  . Colon polyps   . Pneumonia     back in Feb 2014  . Arthritis     neck   . Calcification of coronary artery   . Hypertension     Past Surgical History  Procedure Laterality Date  . Inguinal hernia repair    . Rotator cuff repair Bilateral 1998, 2001  . Pilonidal cyst drainage    . Tonsillectomy    . Colonoscopy    . Endarterectomy Right 11/10/2013    Procedure: ENDARTERECTOMY CAROTID;  Surgeon: Conrad Bondurant, MD;  Location: [place];  Service: Vascular;  Laterality: Right;  . Patch angioplasty Right 11/10/2013    Procedure: PATCH ANGIOPLASTY;  Surgeon: Conrad [place], MD;  Location: [place];  Service: Vascular;  Laterality: Right;  . Nm myocar perf wall motion  04/2011    bruce myoview - perfusion defect in inferior myocardium (diaphragmatic attenuation), remaining myocardium with normal perfusion, EF 67%  . Cardiac catheterization  06/09/1999    mild  CAD (LAD, diagonal, RCA) - Dr. Loni Muse. Little  . Carotid doppler  02/2012    60-79% RICA stenosis, 76-16% LICA stenosis (prior to carotid endarterectomy)    FAMHx:  Family History  Problem Relation Age of Onset  . Prostate cancer Son   . Stroke Father   . Stroke Brother   . Cancer Brother     spine    SOCHx:   reports that he quit smoking about 41 years ago. His smoking use included Cigarettes. He smoked 0.00 packs per day. He has never used smokeless tobacco. He reports that he drinks about 4.2  ounces of alcohol per week. He reports that he does not use illicit drugs.  ALLERGIES:  Allergies  Allergen Reactions  . Lipitor [Atorvastatin Calcium]     Muscle pain  . Vantin [Cefpodoxime] Rash    ROS: A comprehensive review of systems was negative except for: Respiratory: positive for dyspnea on exertion Cardiovascular: positive for exertional chest pressure/discomfort  HOME MEDS: Current Outpatient Prescriptions  Medication Sig Dispense Refill  . ALPHA LIPOIC ACID PO Take 300 mg by mouth daily.      . Cholecalciferol (VITAMIN D3) 2000 UNITS capsule Take 2,000 Units by mouth daily. Take 2,000 Units by mouth daily.      . Coenzyme Q10-Levocarnitine 100-20 MG CAPS Take 1 capsule by mouth daily. Take by mouth.      . Glucosamine-Chondroitin 500-400 MG CAPS Take 1 capsule by mouth daily. Take by mouth.      . Magnesium 500 MG TABS Take 1 tablet by mouth daily.      . Multiple Vitamin (MULTIVITAMIN) tablet Take 0.5 tablets by mouth daily.       . OMEGA-3 1000 MG CAPS Take 4,000 mg by mouth daily.      . pravastatin (PRAVACHOL) 40 MG tablet TAKE 1TABLET BY MOUTH AT BEDTIME.  30 tablet  5  . Probiotic Product (PROBIOTIC DAILY PO) Take 1 capsule by mouth daily.      Marland Kitchen triamcinolone cream (KENALOG) 0.1 % as needed.       No current facility-administered medications for this visit.    LABS/IMAGING: No results found for this or any previous visit (from the past 48 hour(s)). No results found.  VITALS: BP 132/64  Pulse 60  Ht 6' (1.829 m)  Wt 192 lb 3.2 oz (87.181 kg)  BMI 26.06 kg/m2  EXAM: General appearance: alert and no distress Neck: no adenopathy, no carotid bruit, no JVD, supple, symmetrical, trachea midline and thyroid not enlarged, symmetric, no tenderness/mass/nodules Lungs: clear to auscultation bilaterally Heart: regular rate and rhythm, S1, S2 normal, no murmur, click, rub or gallop Abdomen: soft, non-tender; bowel sounds normal; no masses,  no  organomegaly Extremities: extremities normal, atraumatic, no cyanosis or edema Pulses: 2+ and symmetric Skin: Skin color, texture, turgor normal. No rashes or lesions Neurologic: Grossly normal  EKG: Sinus rhythm at 60, no ischemic changes  ASSESSMENT: 1. Exertional dyspnea and chest pain 2. History of calcified coronaries with no obstructive disease 3. Moderate left internal carotid artery stenosis, s/p right CEA 4. Dyslipidemia, intolerant to statins 5. Hypertension-controlled  PLAN: 1.   Sean Miranda is now describing shortness of breath and exertional chest pain. He had a last stress test in 2012 which was negative for ischemia. His catheterization and 2000 showed coronary calcification without any significant obstruction. He has had uncontrolled dyslipidemia and has been intolerant to statins. He's had progressive carotid stenosis in fact recently had right internal carotid endarterectomy. This  suggests progression of his cardiovascular disease and he may likely have progression of his coronary disease. I would recommend an exercise nuclear stress test.  He is willing to try another statin - I will start pravastatin 40 mg and order a repeat lipid profile in 3 months with followup visit at that time. Of course, if his stress test is abnormal I will plan to bring him back sooner as he will likely need cardiac catheterization.   Pixie Casino, MD, [facility] Attending Cardiologist [facility] 05/27/2014, 8:04 AM

## 2014-05-28 ENCOUNTER — Ambulatory Visit (INDEPENDENT_AMBULATORY_CARE_PROVIDER_SITE_OTHER): Payer: Medicare Other | Admitting: Family

## 2014-05-28 ENCOUNTER — Ambulatory Visit (HOSPITAL_COMMUNITY)
Admission: RE | Admit: 2014-05-28 | Discharge: 2014-05-28 | Disposition: A | Discharge status: Home / Self Care | Payer: Medicare Other | Source: Ambulatory Visit | Attending: Family | Admitting: Family

## 2014-05-28 ENCOUNTER — Encounter: Payer: Self-pay | Admitting: Family

## 2014-05-28 VITALS — BP 143/70 | HR 53 | Resp 16 | Ht 72.0 in | Wt 194.0 lb

## 2014-05-28 DIAGNOSIS — I6529 Occlusion and stenosis of unspecified carotid artery: Secondary | ICD-10-CM

## 2014-05-28 DIAGNOSIS — Z48812 Encounter for surgical aftercare following surgery on the circulatory system: Secondary | ICD-10-CM

## 2014-05-28 NOTE — Patient Instructions (Signed)
Stroke Prevention Some medical conditions and behaviors are associated with an increased chance of having a stroke. You may prevent a stroke by making healthy choices and managing medical conditions. HOW CAN I REDUCE MY RISK OF HAVING A STROKE?   Stay physically active. Get at least 30 minutes of activity on most or all days.  Do not smoke. It may also be helpful to avoid exposure to secondhand smoke.  Limit alcohol use. Moderate alcohol use is considered to be:  No more than 2 drinks per day for men.  No more than 1 drink per day for nonpregnant women.  Eat healthy foods. This involves  Eating 5 or more servings of fruits and vegetables a day.  Following a diet that addresses high blood pressure (hypertension), high cholesterol, diabetes, or obesity.  Manage your cholesterol levels.  A diet low in saturated fat, trans fat, and cholesterol and high in fiber may control cholesterol levels.  Take any prescribed medicines to control cholesterol as directed by your health care Rendi Mapel.  Manage your diabetes.  A controlled-carbohydrate, controlled-sugar diet is recommended to manage diabetes.  Take any prescribed medicines to control diabetes as directed by your health care Gaspar Fowle.  Control your hypertension.  A low-salt (sodium), low-saturated fat, low-trans fat, and low-cholesterol diet is recommended to manage hypertension.  Take any prescribed medicines to control hypertension as directed by your health care Maddoxx Burkitt.  Maintain a healthy weight.  A reduced-calorie, low-sodium, low-saturated fat, low-trans fat, low-cholesterol diet is recommended to manage weight.  Stop drug abuse.  Avoid taking birth control pills.  Talk to your health care Roxsana Riding about the risks of taking birth control pills if you are over 35 years old, smoke, get migraines, or have ever had a blood clot.  Get evaluated for sleep disorders (sleep apnea).  Talk to your health care Careena Degraffenreid about  getting a sleep evaluation if you snore a lot or have excessive sleepiness.  Take medicines as directed by your health care Belky Mundo.  For some people, aspirin or blood thinners (anticoagulants) are helpful in reducing the risk of forming abnormal blood clots that can lead to stroke. If you have the irregular heart rhythm of atrial fibrillation, you should be on a blood thinner unless there is a good reason you cannot take them.  Understand all your medicine instructions.  Make sure that other other conditions (such as anemia or atherosclerosis) are addressed. SEEK IMMEDIATE MEDICAL CARE IF:   You have sudden weakness or numbness of the face, arm, or leg, especially on one side of the body.  Your face or eyelid droops to one side.  You have sudden confusion.  You have trouble speaking (aphasia) or understanding.  You have sudden trouble seeing in one or both eyes.  You have sudden trouble walking.  You have dizziness.  You have a loss of balance or coordination.  You have a sudden, severe headache with no known cause.  You have new chest pain or an irregular heartbeat. Any of these symptoms may represent a serious problem that is an emergency. Do not wait to see if the symptoms will go away. Get medical help at once. Call your local emergency services  (911 in [place] Do not drive yourself to the hospital. Document Released: 12/27/2004 Document Revised: 09/09/2013 Document Reviewed: 05/22/2013 ExitCare Patient Information 2015 [employer]. This information is not intended to replace advice given to you by your health care Kaisei Gilbo. Make sure you discuss any questions you have with your health   care Shenetta Schnackenberg.  

## 2014-05-28 NOTE — Progress Notes (Signed)
Established Carotid Patient   History of Present Illness  Sean Miranda is a 80 y.o. male patient of Dr. Bridgett Larsson who is s/p R CEA (Date: 11/10/13). He returns today for surveillance and follow up.  Patient has Negative history of TIA or stroke symptom.  The patient denies amaurosis fugax or monocular blindness.  The patient  denies facial drooping.  Pt. denies hemiplegia.  The patient denies receptive or expressive aphasia.  Pt. denies extremity weakness. He denies claudication symptoms with walking, denies no healing wounds.   Pt denies New Medical or Surgical History. He exercises 45 minutes at least 3-4 days/week. He is scheduled for a stress test next week, Dr. Debara Pickett. He denies any history of MI or any cardiac problems.  Pt Diabetic: No Pt smoker: former smoker, quit in 1974  Pt meds include: Statin : Yes ASA: Yes, 81 mg qod Other anticoagulants/antiplatelets: no   Past Medical History  Diagnosis Date  . Hyperlipidemia     takes Pravastatin every other day  . Colon polyps   . Pneumonia     back in Feb 2014  . Arthritis     neck   . Calcification of coronary artery   . Hypertension   . Carotid artery occlusion     Social History History  Substance Use Topics  . Smoking status: Former Smoker    Types: Cigarettes    Quit date: 12/03/1972  . Smokeless tobacco: Never Used  . Alcohol Use: 4.2 oz/week    7 Glasses of wine per week     Comment: wine nightly    Family History Family History  Problem Relation Age of Onset  . Prostate cancer Son   . Stroke Father   . Stroke Brother   . Cancer Brother     spine  . Hypertension Mother     Surgical History Past Surgical History  Procedure Laterality Date  . Inguinal hernia repair    . Rotator cuff repair Bilateral 1998, 2001  . Pilonidal cyst drainage    . Tonsillectomy    . Colonoscopy    . Endarterectomy Right 11/10/2013    Procedure: ENDARTERECTOMY CAROTID;  Surgeon: Conrad [place], MD;  Location: [place];   Service: Vascular;  Laterality: Right;  . Patch angioplasty Right 11/10/2013    Procedure: PATCH ANGIOPLASTY;  Surgeon: Conrad Whiteside, MD;  Location: [facility];  Service: Vascular;  Laterality: Right;  . Nm myocar perf wall motion  04/2011    bruce myoview - perfusion defect in inferior myocardium (diaphragmatic attenuation), remaining myocardium with normal perfusion, EF 67%  . Cardiac catheterization  06/09/1999    mild CAD (LAD, diagonal, RCA) - Dr. Loni Muse. [place]  . Carotid doppler  02/2012    60-79% RICA stenosis, 85-63% LICA stenosis (prior to carotid endarterectomy)  . Carotid endarterectomy Right 11-10-13    cea    Allergies  Allergen Reactions  . Lipitor [Atorvastatin Calcium] Other (See Comments)    Muscle pain  . Vantin [Cefpodoxime] Rash    Current Outpatient Prescriptions  Medication Sig Dispense Refill  . ALPHA LIPOIC ACID PO Take 300 mg by mouth daily.      . Cholecalciferol (VITAMIN D3) 2000 UNITS capsule Take 2,000 Units by mouth daily. Take 2,000 Units by mouth daily.      . Coenzyme Q10-Levocarnitine 100-20 MG CAPS Take 1 capsule by mouth daily. Take by mouth.      . Glucosamine-Chondroitin 500-400 MG CAPS Take 1 capsule by mouth daily. Take by  mouth.      . Magnesium 500 MG TABS Take 1 tablet by mouth daily.      . Multiple Vitamin (MULTIVITAMIN) tablet Take 0.5 tablets by mouth daily.       . OMEGA-3 1000 MG CAPS Take 4,000 mg by mouth daily.      . pravastatin (PRAVACHOL) 40 MG tablet TAKE 1TABLET BY MOUTH AT BEDTIME.  30 tablet  5  . Probiotic Product (PROBIOTIC DAILY PO) Take 1 capsule by mouth daily.      Marland Kitchen triamcinolone cream (KENALOG) 0.1 % as needed.       No current facility-administered medications for this visit.    Review of Systems : See HPI for pertinent positives and negatives.  Physical Examination  Filed Vitals:   05/28/14 1128 05/28/14 1131  BP: 149/69 143/70  Pulse: 58 53  Resp:  16  Height:  6' (1.829 m)  Weight:  194 lb (87.998 kg)  SpO2:  98%    Body mass index is 26.31 kg/(m^2).  General: A&O x 3, WD  Eyes: PERRLA  Pulmonary: Sym exp, good air movt, CTAB, no rales, rhonchi, & wheezing  Cardiac: RRR, Nl S1, S2, low grade Murmur detected  Vascular:  Vessel  Right  Left   Radial  Faintly Palpable  Palpable   Carotid  Palpable, without bruit  Palpable, without bruit   Popliteal  Not palpable  Not palpable   PT  Palpable  Not Palpable   DP  Palpable  Not Palpable    Gastrointestinal: soft, NTND, -G/R, - HSM, - masses, - CVAT B Musculoskeletal: M/S 5/5 throughout , Extremities without ischemic changes  Neurologic: CN 2-12 intact , Pain and light touch intact in extremities , Motor exam as listed above  Psychiatric: Judgment intact, Mood & affect appropriate for pt's clinical situation  Dermatologic: See M/S exam for extremity exam, no rashes otherwise noted   Non-Invasive Vascular Imaging CAROTID DUPLEX 05/28/2014   CEREBROVASCULAR DUPLEX EVALUATION    INDICATION: Carotid artery disease     PREVIOUS INTERVENTION(S): Right carotid endarterectomy 11/10/2013.    DUPLEX EXAM:     RIGHT  LEFT  Peak Systolic Velocities (cm/s) End Diastolic Velocities (cm/s) Plaque LOCATION Peak Systolic Velocities (cm/s) End Diastolic Velocities (cm/s) Plaque  82 14  CCA PROXIMAL 94 17   105 17  CCA MID 94 17   94 15  CCA DISTAL 77 14   119 14  ECA 133 12   87 18  ICA PROXIMAL 77 20   171 42 HM ICA MID 82 18   100 27  ICA DISTAL 89 22      ICA / CCA Ratio (PSV) 0.94  Antegrade  Vertebral Flow Antegrade   546 Brachial Systolic Pressure (mmHg) 503  Triphasic  Brachial Artery Waveforms Triphasic     Plaque Morphology:  HM = Homogeneous, HT = Heterogeneous, CP = Calcific Plaque, SP = Smooth Plaque, IP = Irregular Plaque     ADDITIONAL FINDINGS:     IMPRESSION: Patent right carotid endarterectomy site with evidence of mild hyperplasia. Elevated velocities present at the distal patch site which appears to be due to a change in vessel  diameter and hyperplasia. Velocities suggest a 40-59% stenosis. Widely patent left internal carotid artery with no evidence of stenosis.     Compared to the previous exam:  Comparison invalid due to recent intervention.    Assessment: Sean Miranda is a 80 y.o. male who is s/p R CEA (Date: 11/10/13). He  presents with asymptomatic patent right carotid endarterectomy site with evidence of mild hyperplasia. Elevated velocities present at the distal patch site which appears to be due to a change in vessel diameter and hyperplasia. Velocities suggest a 40-59% stenosis. Widely patent left internal carotid artery with no evidence of stenosis. Comparison invalid due to recent intervention. He has excellent lifestyle habits.   Plan: Follow-up in 6 months with Carotid Duplex scan.   I discussed in depth with the patient the nature of atherosclerosis, and emphasized the importance of maximal medical management including strict control of blood pressure, blood glucose, and lipid levels, obtaining regular exercise, and continued cessation of smoking.  The patient is aware that without maximal medical management the underlying atherosclerotic disease process will progress, limiting the benefit of any interventions. The patient was given information about stroke prevention and what symptoms should prompt the patient to seek immediate medical care. Thank you for allowing [place] to participate in this patient's care.  Clemon Chambers, RN, MSN, FNP-C [facility] Office: [phone]  Clinic Physician: Bridgett Larsson  05/28/2014 11:31 AM

## 2014-05-28 NOTE — Addendum Note (Signed)
Addended by: Mena Goes on: 05/28/2014 01:01 PM   Modules accepted: Orders

## 2014-06-01 ENCOUNTER — Telehealth (HOSPITAL_COMMUNITY): Payer: Self-pay

## 2014-06-03 ENCOUNTER — Ambulatory Visit (HOSPITAL_COMMUNITY)
Admission: RE | Admit: 2014-06-03 | Discharge: 2014-06-03 | Disposition: A | Discharge status: Home / Self Care | Payer: Medicare Other | Source: Ambulatory Visit | Attending: Internal Medicine | Admitting: Internal Medicine

## 2014-06-03 DIAGNOSIS — Z87891 Personal history of nicotine dependence: Secondary | ICD-10-CM | POA: Insufficient documentation

## 2014-06-03 DIAGNOSIS — I739 Peripheral vascular disease, unspecified: Secondary | ICD-10-CM | POA: Insufficient documentation

## 2014-06-03 DIAGNOSIS — R0602 Shortness of breath: Secondary | ICD-10-CM

## 2014-06-03 DIAGNOSIS — R0609 Other forms of dyspnea: Secondary | ICD-10-CM | POA: Insufficient documentation

## 2014-06-03 DIAGNOSIS — R0989 Other specified symptoms and signs involving the circulatory and respiratory systems: Secondary | ICD-10-CM | POA: Insufficient documentation

## 2014-06-03 DIAGNOSIS — I779 Disorder of arteries and arterioles, unspecified: Secondary | ICD-10-CM | POA: Insufficient documentation

## 2014-06-03 DIAGNOSIS — I2510 Atherosclerotic heart disease of native coronary artery without angina pectoris: Secondary | ICD-10-CM | POA: Insufficient documentation

## 2014-06-03 DIAGNOSIS — R079 Chest pain, unspecified: Secondary | ICD-10-CM

## 2014-06-03 DIAGNOSIS — I10 Essential (primary) hypertension: Secondary | ICD-10-CM | POA: Insufficient documentation

## 2014-06-03 MED ORDER — TECHNETIUM TC 99M SESTAMIBI GENERIC - CARDIOLITE
30.9000 | Freq: Once | INTRAVENOUS | Status: AC | PRN
Start: 2014-06-03 — End: 2014-06-03
  Administered 2014-06-03: 30.9 via INTRAVENOUS

## 2014-06-03 MED ORDER — TECHNETIUM TC 99M SESTAMIBI GENERIC - CARDIOLITE
10.8000 | Freq: Once | INTRAVENOUS | Status: AC | PRN
Start: 2014-06-03 — End: 2014-06-03
  Administered 2014-06-03: 11 via INTRAVENOUS

## 2014-06-03 NOTE — Procedures (Addendum)
[place] [facility] [address] [place] [address] [phone]  Cardiology Nuclear Med Study  Sean Miranda is a 80 y.o. male     MRN : [id number]     DOB: Aug 26, 1930  Procedure Date: 06/03/2014  Nuclear Med Background Indication for Stress Test:  Evaluation for Ischemia History:  CAD;No prior respiratory history reported;Last NUC MPI in 04/2011-nonischemic;EF=67% Cardiac Risk Factors: Carotid Disease, History of Smoking, Hypertension, Lipids and PVD  Symptoms:  Chest Pain, DOE and SOB   Nuclear Pre-Procedure Caffeine/Decaff Intake:  7:00pm NPO After: 5:00am   IV Site: R Forearm  IV 0.9% NS with Angio Cath:  22g  Chest Size (in):  44"  IV Started by: Rolene Course, RN  Height: 6' (1.829 m)  Cup Size: n/a  BMI:  Body mass index is 26.03 kg/(m^2). Weight:  192 lb (87.091 kg)   Tech Comments:  n/a    Nuclear Med Study 1 or 2 day study: 1 day  Stress Test Type:  Stress  Order Authorizing Provider:  Lyman Bishop, MD   Resting Radionuclide: Technetium 53m Sestamibi  Resting Radionuclide Dose: 10.8 mCi   Stress Radionuclide:  Technetium 26m Sestamibi  Stress Radionuclide Dose: 30.9 mCi           Stress Protocol Rest HR: 55 Stress HR: 118  Rest BP: 147/80 Stress BP: 209/90  Exercise Time (min): 9 min METS: 10.1   Predicted Max HR: 138 bpm % Max HR: 85.51 bpm Rate Pressure Product: 25960  Dose of Adenosine (mg):  n/a Dose of Lexiscan: 0.4 mg  Dose of Atropine (mg): n/a Dose of Dobutamine: n/a mcg/kg/min (at max HR)  Stress Test Technologist: Leane Para, CCT Nuclear Technologist: Imagene Riches, CNMT   Rest Procedure:  Myocardial perfusion imaging was performed at rest 45 minutes following the intravenous administration of Technetium 61m Sestamibi. Stress Procedure:  The patient performed treadmill exercise using a Bruce  Protocol for 9 minutes. The patient stopped due to SOB and denied any chest pain.  There were no  significant ST-T wave changes.  Technetium 70m Sestamibi was injected IV at peak exercise and myocardial perfusion imaging was performed after a brief delay.  Transient Ischemic Dilatation (Normal <1.22):  1.12  QGS EDV:  93 ml QGS ESV:  36 ml LV Ejection Fraction: 61%  Rest ECG: NSR - Normal EKG  Stress ECG: No significant ST segment change suggestive of ischemia.  QPS Raw Data Images:  Normal; no motion artifact; normal heart/lung ratio. Stress Images:  Decreased inferoapical uptake Rest Images:  Decreased inferoapical uptake Subtraction (SDS):  No evidence of ischemia.  Impression Exercise Capacity:  Good exercise capacity. BP Response:  Hypertensive blood pressure response. Clinical Symptoms:  No significant symptoms noted. ECG Impression:  No significant ST segment change suggestive of ischemia. Comparison with Prior Nuclear Study: No significant change from previous study  Overall Impression:  Low risk stress nuclear study with small fixed inferoapical defect. No reversible ischemia. Hypertensive response to exercise.  LV Wall Motion:  NL LV Function; NL Wall Motion; EF 61%.  Pixie Casino, MD, [facility] Board Certified in Nuclear Cardiology Attending Cardiologist [place], MD  06/03/2014 12:53 PM

## 2014-06-10 NOTE — Telephone Encounter (Signed)
Encounter complete. 

## 2014-07-05 ENCOUNTER — Telehealth: Payer: Self-pay | Admitting: Internal Medicine

## 2014-07-05 NOTE — Telephone Encounter (Signed)
You seen this pt 3x last year, not sure why he is wanting a meet and greet.  Please advise

## 2014-07-05 NOTE — Telephone Encounter (Signed)
Pt would like to schedule a meet and greet with Dr. Shawna Orleans.  Please advise

## 2014-07-19 NOTE — Telephone Encounter (Signed)
Pt would like to know if dr. Shawna Orleans would accept him as a transfer from [facility], Ok to schedule?

## 2014-07-20 NOTE — Telephone Encounter (Signed)
yes

## 2014-07-22 ENCOUNTER — Encounter: Payer: Self-pay | Admitting: Internal Medicine

## 2014-07-22 ENCOUNTER — Ambulatory Visit (INDEPENDENT_AMBULATORY_CARE_PROVIDER_SITE_OTHER): Payer: Medicare Other | Admitting: Internal Medicine

## 2014-07-22 VITALS — BP 150/72 | HR 57 | Ht 72.0 in | Wt 192.5 lb

## 2014-07-22 DIAGNOSIS — I10 Essential (primary) hypertension: Secondary | ICD-10-CM

## 2014-07-22 DIAGNOSIS — I779 Disorder of arteries and arterioles, unspecified: Secondary | ICD-10-CM

## 2014-07-22 DIAGNOSIS — E785 Hyperlipidemia, unspecified: Secondary | ICD-10-CM

## 2014-07-22 DIAGNOSIS — I739 Peripheral vascular disease, unspecified: Secondary | ICD-10-CM

## 2014-07-22 DIAGNOSIS — R079 Chest pain, unspecified: Secondary | ICD-10-CM

## 2014-07-22 DIAGNOSIS — I2510 Atherosclerotic heart disease of native coronary artery without angina pectoris: Secondary | ICD-10-CM

## 2014-07-22 NOTE — Patient Instructions (Signed)
Your physician wants you to follow-up in: 1 year. You will receive a reminder letter in the mail two months in advance. If you don't receive a letter, please call our office to schedule the follow-up appointment.  

## 2014-07-23 ENCOUNTER — Encounter: Payer: Self-pay | Admitting: Internal Medicine

## 2014-07-23 NOTE — Progress Notes (Signed)
OFFICE NOTE  Chief Complaint:  Routine followup  Primary Care Physician: Chancy Hurter, MD  HPI:  Sean Miranda is a pleasant 80 yo male, previously followed by Dr., Rex Kras, who has calcification of his coronary arteries documented by catheterization in 2009, but no occlusive disease. He had a nuclear study May 2012 that was a low risk and a 67% ejection fraction. He has known carotid disease, had Doppler studies done through [employer] that shows less than 80% on the right and less than 60% on the left, which is an increase from his Doppler study done a year previously, and he is currently supposedly scheduled for repeat study in 6 months. He has had no episodes of angina, no unusual shortness of breath, no awareness of any arrhythmias. He has had no ankle edema. He denies dizziness, lightheadedness, unilateral weakness, slurred speech or blurred vision. He checks his blood pressure regularly at home. It has been under good control. He has no unusual shortness of breath and has had no changes in exercise tolerance or stamina. He had had significant myalgias from Lipitor in the past, and I am a little concerned he may develop recurrent myalgias from the daily dose of Pravachol but I see no reason we cannot try this in view of his lipids which now show an LDL of 142 with an HDL of 51.  Unfortunately he was intolerant of Pravachol and other statins, is not currently taking them. He recently had repeat carotid Dopplers which showed high grade velocities in the right carotid and the upper end of the range of 79%. His carotids are followed that with our imaging and Dr. Leanne Chang is coordinating that.  Sean Miranda returns today and is doing fairly well. He does report some shortness of breath with exertion and some chest tightness which improves after exercise.  The chest tightness is fairly predictable with exercise and relieved by rest. This is concerning for possible angina. He  reports he discontinued his pravastatin about one year ago due to problems with leg cramps. He's also had side effects with Lipitor. Recent cholesterol profile showed total cholesterol 222, triglycerides 82, HDL 43 and LDL 162.  He's also had progression of his right internal carotid artery stenosis and underwent right carotid endarterectomy for greater than 80% stenosis of that artery. He does have moderate stenosis of the left internal carotid.  Sean Miranda was referred for a stress test after his last visit for exertional chest pain. Stress test was negative for ischemia.  PMHx:  Past Medical History  Diagnosis Date  . Hyperlipidemia     takes Pravastatin every other day  . Colon polyps   . Pneumonia     back in Feb 2014  . Arthritis     neck   . Calcification of coronary artery   . Hypertension   . Carotid artery occlusion     Past Surgical History  Procedure Laterality Date  . Inguinal hernia repair    . Rotator cuff repair Bilateral 1998, 2001  . Pilonidal cyst drainage    . Tonsillectomy    . Colonoscopy    . Endarterectomy Right 11/10/2013    Procedure: ENDARTERECTOMY CAROTID;  Surgeon: Conrad Del Rio, MD;  Location: [facility];  Service: Vascular;  Laterality: Right;  . Patch angioplasty Right 11/10/2013    Procedure: PATCH ANGIOPLASTY;  Surgeon: Conrad [place], MD;  Location: [facility];  Service: Vascular;  Laterality: Right;  . Nm myocar perf wall motion  04/2011  bruce myoview - perfusion defect in inferior myocardium (diaphragmatic attenuation), remaining myocardium with normal perfusion, EF 67%  . Cardiac catheterization  06/09/1999    mild CAD (LAD, diagonal, RCA) - Dr. Loni Muse. [facility]  . Carotid doppler  02/2012    60-79% RICA stenosis, 75-10% LICA stenosis (prior to carotid endarterectomy)  . Carotid endarterectomy Right 11-10-13    cea    FAMHx:  Family History  Problem Relation Age of Onset  . Prostate cancer Son   . Stroke Father   . Stroke Brother   . Cancer Brother      spine  . Hypertension Mother     SOCHx:   reports that he quit smoking about 41 years ago. His smoking use included Cigarettes. He smoked 0.00 packs per day. He has never used smokeless tobacco. He reports that he drinks about 4.2 ounces of alcohol per week. He reports that he does not use illicit drugs.  ALLERGIES:  Allergies  Allergen Reactions  . Lipitor [Atorvastatin Calcium] Other (See Comments)    Muscle pain  . Vantin [Cefpodoxime] Rash    ROS: A comprehensive review of systems was negative.  HOME MEDS: Current Outpatient Prescriptions  Medication Sig Dispense Refill  . ALPHA LIPOIC ACID PO Take 300 mg by mouth daily.      . Cholecalciferol (VITAMIN D3) 2000 UNITS capsule Take 2,000 Units by mouth daily. Take 2,000 Units by mouth daily.      . Coenzyme Q10-Levocarnitine 100-20 MG CAPS Take 1 capsule by mouth daily. Take by mouth.      . Glucosamine-Chondroitin 500-400 MG CAPS Take 1 capsule by mouth daily. Take by mouth.      . Magnesium 500 MG TABS Take 1 tablet by mouth daily.      . Multiple Vitamin (MULTIVITAMIN) tablet Take 0.5 tablets by mouth daily.       . OMEGA-3 1000 MG CAPS Take 4,000 mg by mouth daily.      . pravastatin (PRAVACHOL) 40 MG tablet TAKE 1TABLET BY MOUTH AT BEDTIME.  30 tablet  5  . Probiotic Product (PROBIOTIC DAILY PO) Take 1 capsule by mouth daily.      Marland Kitchen triamcinolone cream (KENALOG) 0.1 % as needed.       No current facility-administered medications for this visit.    LABS/IMAGING: No results found for this or any previous visit (from the past 48 hour(s)). No results found.  VITALS: BP 150/72  Pulse 57  Ht 6' (1.829 m)  Wt 192 lb 8 oz (87.317 kg)  BMI 26.10 kg/m2  EXAM: deferred  EKG: deferred  ASSESSMENT: 1. Exertional dyspnea and chest pain - negative nuclear stress test 2. History of calcified coronaries with no obstructive disease 3. Moderate left internal carotid artery stenosis, s/p right CEA 4. Dyslipidemia -  tolerating pravastatin 5. Hypertension-controlled  PLAN: 1.   Sean Miranda had a negative nuclear stress test which is reassuring. He reports his symptoms have improved. He is now tolerating pravastatin and I recommended he continue that. We will plan to see him back annually or sooner as necessary.  Pixie Casino, MD, [facility] Attending Cardiologist [facility] [place] [facility] 07/23/2014, 4:57 PM

## 2014-07-26 NOTE — Telephone Encounter (Signed)
I called pt and spoke to his wife about scheduling appointment.  Pt is scheduled for 08/23/14, appointment was confirmed with his wife and she said she will let him know.

## 2014-08-23 ENCOUNTER — Ambulatory Visit: Payer: Medicare Other | Admitting: Internal Medicine

## 2014-08-26 ENCOUNTER — Ambulatory Visit: Payer: Medicare Other | Admitting: Internal Medicine

## 2014-08-26 ENCOUNTER — Other Ambulatory Visit (INDEPENDENT_AMBULATORY_CARE_PROVIDER_SITE_OTHER): Payer: Medicare Other

## 2014-08-26 DIAGNOSIS — E785 Hyperlipidemia, unspecified: Secondary | ICD-10-CM

## 2014-08-26 LAB — LIPID PANEL
CHOL/HDL RATIO: 5
Cholesterol: 216 mg/dL — ABNORMAL HIGH (ref 0–200)
HDL: 39.4 mg/dL (ref >39.00)
LDL Cholesterol: 159 mg/dL — ABNORMAL HIGH (ref 0–99)
NONHDL: 176.6
Triglycerides: 86 mg/dL (ref 0.0–149.0)
VLDL: 17.2 mg/dL (ref 0.0–40.0)

## 2014-08-26 LAB — HEPATIC FUNCTION PANEL
ALK PHOS: 52 U/L (ref 39–117)
ALT: 20 U/L (ref 0–53)
AST: 26 U/L (ref 0–37)
Albumin: 4 g/dL (ref 3.5–5.2)
BILIRUBIN DIRECT: 0.1 mg/dL (ref 0.0–0.3)
TOTAL PROTEIN: 7.1 g/dL (ref 6.0–8.3)
Total Bilirubin: 0.8 mg/dL (ref 0.2–1.2)

## 2014-08-27 LAB — HOMOCYSTEINE: Homocysteine: 11.9 umol/L (ref 4.0–15.4)

## 2014-09-14 ENCOUNTER — Telehealth: Payer: Self-pay | Admitting: Internal Medicine

## 2014-09-14 NOTE — Telephone Encounter (Signed)
Pt stated he will come by tomorrow to pick up labs.  They are up front for pick up

## 2014-09-14 NOTE — Telephone Encounter (Signed)
Pt would like a result of blood work. Pt also would like wife to pick up copy to take to another provider.

## 2014-09-16 ENCOUNTER — Encounter: Payer: Self-pay | Admitting: Internal Medicine

## 2014-09-16 ENCOUNTER — Ambulatory Visit (INDEPENDENT_AMBULATORY_CARE_PROVIDER_SITE_OTHER): Payer: Medicare Other | Admitting: Internal Medicine

## 2014-09-16 VITALS — BP 138/66 | HR 64 | Ht 72.0 in | Wt 194.6 lb

## 2014-09-16 DIAGNOSIS — E785 Hyperlipidemia, unspecified: Secondary | ICD-10-CM

## 2014-09-16 DIAGNOSIS — I10 Essential (primary) hypertension: Secondary | ICD-10-CM

## 2014-09-16 DIAGNOSIS — I2510 Atherosclerotic heart disease of native coronary artery without angina pectoris: Secondary | ICD-10-CM

## 2014-09-16 DIAGNOSIS — I739 Peripheral vascular disease, unspecified: Principal | ICD-10-CM

## 2014-09-16 DIAGNOSIS — R079 Chest pain, unspecified: Secondary | ICD-10-CM

## 2014-09-16 DIAGNOSIS — I779 Disorder of arteries and arterioles, unspecified: Secondary | ICD-10-CM

## 2014-09-16 NOTE — Progress Notes (Signed)
OFFICE NOTE  Chief Complaint:  Routine followup  Primary Care Physician: Drema Pry, DO  HPI:  Sean Miranda is a pleasant 80 yo male, previously followed by Dr., Rex Kras, who has calcification of his coronary arteries documented by catheterization in 2009, but no occlusive disease. He had a nuclear study May 2012 that was a low risk and a 67% ejection fraction. He has known carotid disease, had Doppler studies done through [employer] that shows less than 80% on the right and less than 60% on the left, which is an increase from his Doppler study done a year previously, and he is currently supposedly scheduled for repeat study in 6 months. He has had no episodes of angina, no unusual shortness of breath, no awareness of any arrhythmias. He has had no ankle edema. He denies dizziness, lightheadedness, unilateral weakness, slurred speech or blurred vision. He checks his blood pressure regularly at home. It has been under good control. He has no unusual shortness of breath and has had no changes in exercise tolerance or stamina. He had had significant myalgias from Lipitor in the past, and I am a little concerned he may develop recurrent myalgias from the daily dose of Pravachol but I see no reason we cannot try this in view of his lipids which now show an LDL of 142 with an HDL of 51.  Unfortunately he was intolerant of Pravachol and other statins, is not currently taking them. He recently had repeat carotid Dopplers which showed high grade velocities in the right carotid and the upper end of the range of 79%. His carotids are followed that with our imaging and Dr. Leanne Chang is coordinating that.  [name] returns today and is doing fairly well. He does report some shortness of breath with exertion and some chest tightness which improves after exercise.  The chest tightness is fairly predictable with exercise and relieved by rest. This is concerning for possible angina. He reports he  discontinued his pravastatin about one year ago due to problems with leg cramps. He's also had side effects with Lipitor. Recent cholesterol profile showed total cholesterol 222, triglycerides 82, HDL 43 and LDL 162.  He's also had progression of his right internal carotid artery stenosis and underwent right carotid endarterectomy for greater than 80% stenosis of that artery. He does have moderate stenosis of the left internal carotid.  [name] was referred for a stress test after his last visit for exertional chest pain. Stress test was negative for ischemia. He returns today without complaints. He is due for a repeat doppler in 3-4 months.  PMHx:  Past Medical History  Diagnosis Date  . Hyperlipidemia     takes Pravastatin every other day  . Colon polyps   . Pneumonia     back in Feb 2014  . Arthritis     neck   . Calcification of coronary artery   . Hypertension   . Carotid artery occlusion     Past Surgical History  Procedure Laterality Date  . Inguinal hernia repair    . Rotator cuff repair Bilateral 1998, 2001  . Pilonidal cyst drainage    . Tonsillectomy    . Colonoscopy    . Endarterectomy Right 11/10/2013    Procedure: ENDARTERECTOMY CAROTID;  Surgeon: Conrad Killona, MD;  Location: [place];  Service: Vascular;  Laterality: Right;  . Patch angioplasty Right 11/10/2013    Procedure: PATCH ANGIOPLASTY;  Surgeon: Conrad [place], MD;  Location: [place];  Service: Vascular;  Laterality: Right;  . Nm myocar perf wall motion  04/2011    bruce myoview - perfusion defect in inferior myocardium (diaphragmatic attenuation), remaining myocardium with normal perfusion, EF 67%  . Cardiac catheterization  06/09/1999    mild CAD (LAD, diagonal, RCA) - Dr. Loni Muse. [place]  . Carotid doppler  02/2012    60-79% RICA stenosis, 16-10% LICA stenosis (prior to carotid endarterectomy)  . Carotid endarterectomy Right 11-10-13    cea    FAMHx:  Family History  Problem Relation Age of Onset  . Prostate  cancer Son   . Stroke Father   . Stroke Brother   . Cancer Brother     spine  . Hypertension Mother     SOCHx:   reports that he quit smoking about 41 years ago. His smoking use included Cigarettes. He smoked 0.00 packs per day. He has never used smokeless tobacco. He reports that he drinks about 4.2 ounces of alcohol per week. He reports that he does not use illicit drugs.  ALLERGIES:  Allergies  Allergen Reactions  . Lipitor [Atorvastatin Calcium] Other (See Comments)    Muscle pain  . Vantin [Cefpodoxime] Rash    ROS: A comprehensive review of systems was negative.  HOME MEDS: Current Outpatient Prescriptions  Medication Sig Dispense Refill  . ALPHA LIPOIC ACID PO Take 300 mg by mouth daily.      . Cholecalciferol (VITAMIN D3) 2000 UNITS capsule Take 2,000 Units by mouth daily. Take 2,000 Units by mouth daily.      . Coenzyme Q10-Levocarnitine 100-20 MG CAPS Take 1 capsule by mouth daily. Take by mouth.      . Glucosamine-Chondroitin 500-400 MG CAPS Take 1 capsule by mouth daily. Take by mouth.      . Magnesium 500 MG TABS Take 1 tablet by mouth daily.      . Multiple Vitamin (MULTIVITAMIN) tablet Take 0.5 tablets by mouth daily.       . OMEGA-3 1000 MG CAPS Take 4,000 mg by mouth daily.      . pravastatin (PRAVACHOL) 40 MG tablet TAKE 1TABLET BY MOUTH AT BEDTIME.  30 tablet  5  . Probiotic Product (PROBIOTIC DAILY PO) Take 1 capsule by mouth daily.      Marland Kitchen triamcinolone cream (KENALOG) 0.1 % as needed.       No current facility-administered medications for this visit.    LABS/IMAGING: No results found for this or any previous visit (from the past 48 hour(s)). No results found.  VITALS: BP 138/66  Pulse 64  Ht 6' (1.829 m)  Wt 194 lb 9.6 oz (88.27 kg)  BMI 26.39 kg/m2  EXAM: General appearance: alert and no distress Lungs: clear to auscultation bilaterally Heart: regular rate and rhythm, S1, S2 normal, no murmur, click, rub or gallop Extremities: extremities  normal, atraumatic, no cyanosis or edema  EKG: NSR at 64  ASSESSMENT: 1. Exertional dyspnea and chest pain - negative nuclear stress test 2. History of calcified coronaries with no obstructive disease 3. Moderate left internal carotid artery stenosis, s/p right CEA 4. Dyslipidemia - tolerating pravastatin 5. Hypertension-controlled  PLAN: 1.   Mr. Inclan had a negative nuclear stress test which is reassuring. He denies any chest pain. He has no shortness of breath. He is due for repeat carotid Dopplers around Christmas. His hypertension is well controlled. Have encouraged him to continue to get exercise and we will see him back annually or sooner as necessary.  Pixie Casino, MD,  [facility] Attending Cardiologist [facility] 09/16/2014, 8:37 AM

## 2014-09-16 NOTE — Patient Instructions (Signed)
Follow-up annually

## 2014-09-17 ENCOUNTER — Other Ambulatory Visit: Payer: Self-pay

## 2014-09-20 ENCOUNTER — Other Ambulatory Visit (HOSPITAL_COMMUNITY): Payer: Self-pay | Admitting: Cardiology

## 2014-09-20 DIAGNOSIS — I6523 Occlusion and stenosis of bilateral carotid arteries: Secondary | ICD-10-CM

## 2014-10-01 ENCOUNTER — Encounter (HOSPITAL_COMMUNITY): Payer: Medicare Other

## 2014-10-04 ENCOUNTER — Encounter: Payer: Self-pay | Admitting: Internal Medicine

## 2014-10-04 ENCOUNTER — Ambulatory Visit (INDEPENDENT_AMBULATORY_CARE_PROVIDER_SITE_OTHER): Payer: Medicare Other | Admitting: Internal Medicine

## 2014-10-04 VITALS — BP 124/66 | HR 60 | Temp 97.9°F | Ht 71.0 in | Wt 195.0 lb

## 2014-10-04 DIAGNOSIS — I6529 Occlusion and stenosis of unspecified carotid artery: Secondary | ICD-10-CM

## 2014-10-04 DIAGNOSIS — E785 Hyperlipidemia, unspecified: Secondary | ICD-10-CM

## 2014-10-04 MED ORDER — PRAVASTATIN SODIUM 40 MG PO TABS: ORAL_TABLET | ORAL | Status: DC

## 2014-10-04 NOTE — Progress Notes (Signed)
Subjective:    Patient ID: Sean Miranda, male    DOB: 1930-05-29, 80 y.o.   MRN: [id number]  HPI  80 year old white male to transfer care from Dr. Leanne Chang. He has history of nonobstructive coronary artery disease. Patient previously followed by Dr. Johnsie Cancel. He also has history of carotid disease status post right endarterectomy.  Patient now followed by Dr. Debara Pickett.  Recent cardiac studies reviewed - negative nuclear stress test (06/03/14).  Over the past year patient reports he discontinued his pravastatin. He discontinued medication because of possible side effect. He mentions bilateral leg pains. Patient did not notice any significant change with discontinuing pravastatin. A dietitian gave him advice to increase his protein intake. Patient reports leg pains improved after starting protein supplement.  He reports taking aspirin regular basis.  Review of Systems Negative for chest pain.  Rare intermittent shortness of breath with walking to his mail box.  He exercises regularly.  No chest pain or shortness of breath with walking on his treadmill and lifting light weights.    Past Medical History  Diagnosis Date  . Hyperlipidemia     takes Pravastatin every other day  . Colon polyps   . Pneumonia     back in Feb 2014  . Arthritis     neck   . Calcification of coronary artery   . Hypertension   . Carotid artery occlusion     History   Social History  . Marital Status: Married    Spouse Name: N/A    Number of Children: 3  . Years of Education: N/A   Occupational History  .     Social History Main Topics  . Smoking status: Former Smoker    Types: Cigarettes    Quit date: 12/03/1972  . Smokeless tobacco: Never Used  . Alcohol Use: 4.2 oz/week    7 Glasses of wine per week     Comment: wine nightly  . Drug Use: No  . Sexual Activity: Not on file   Other Topics Concern  . Not on file   Social History Narrative    Past Surgical History  Procedure Laterality Date    . Inguinal hernia repair    . Rotator cuff repair Bilateral 1998, 2001  . Pilonidal cyst drainage    . Tonsillectomy    . Colonoscopy    . Endarterectomy Right 11/10/2013    Procedure: ENDARTERECTOMY CAROTID;  Surgeon: Conrad [place], MD;  Location: [place];  Service: Vascular;  Laterality: Right;  . Patch angioplasty Right 11/10/2013    Procedure: PATCH ANGIOPLASTY;  Surgeon: Conrad [place]ind[place], MD;  Location: [place];  Service: Vascular;  Laterality: Right;  . Nm myocar perf wall motion  04/2011    bruce myoview - perfusion defect in inferior myocardium (diaphragmatic attenuation), remaining myocardium with normal perfusion, EF 67%  . Cardiac catheterization  06/09/1999    mild CAD (LAD, diagonal, RCA) - Dr. Loni Muse. [facility]  . Carotid doppler  02/2012    60-79% RICA stenosis, 15-40% LICA stenosis (prior to carotid endarterectomy)  . Carotid endarterectomy Right 11-10-13    cea    Family History  Problem Relation Age of Onset  . Prostate cancer Son   . Stroke Father   . Stroke Brother   . Cancer Brother     spine  . Hypertension Mother     Allergies  Allergen Reactions  . Lipitor [Atorvastatin Calcium] Other (See Comments)    Muscle pain  . Vantin [Cefpodoxime] Rash  Current Outpatient Prescriptions on File Prior to Visit  Medication Sig Dispense Refill  . ALPHA LIPOIC ACID PO Take 300 mg by mouth daily.    . Cholecalciferol (VITAMIN D3) 2000 UNITS capsule Take 2,000 Units by mouth daily. Take 2,000 Units by mouth daily.    . Coenzyme Q10-Levocarnitine 100-20 MG CAPS Take 1 capsule by mouth daily. Take by mouth.    . Glucosamine-Chondroitin 500-400 MG CAPS Take 1 capsule by mouth daily. Take by mouth.    . Magnesium 500 MG TABS Take 1 tablet by mouth daily.    . Multiple Vitamin (MULTIVITAMIN) tablet Take 0.5 tablets by mouth daily.     . OMEGA-3 1000 MG CAPS Take 4,000 mg by mouth daily.    . pravastatin (PRAVACHOL) 40 MG tablet TAKE 1TABLET BY MOUTH AT BEDTIME. 30 tablet 5  . Probiotic  Product (PROBIOTIC DAILY PO) Take 1 capsule by mouth daily.    Marland Kitchen triamcinolone cream (KENALOG) 0.1 % as needed.     No current facility-administered medications on file prior to visit.    BP 124/66 mmHg  Pulse 60  Temp(Src) 97.9 F (36.6 C) (Oral)  Ht 5\' 11"  (1.803 m)  Wt 195 lb (88.451 kg)  BMI 27.21 kg/m2    Objective:   Physical Exam  Constitutional: He is oriented to person, place, and time. He appears well-developed and well-nourished. No distress.  Cardiovascular: Normal rate, regular rhythm and normal heart sounds.   Faint SEM right sternal border.  Pulmonary/Chest: Effort normal. He has no wheezes.  Neurological: He is alert and oriented to person, place, and time. No cranial nerve deficit.  Psychiatric: He has a normal mood and affect. His behavior is normal.          Assessment & Plan:

## 2014-10-04 NOTE — Progress Notes (Signed)
Pre visit review using our clinic review tool, if applicable. No additional management support is needed unless otherwise documented below in the visit note. 

## 2014-10-04 NOTE — Patient Instructions (Signed)
Please complete the following lab tests before your next follow up appointment: BMET, FLP, LFTs, TSH - 272.4

## 2014-10-04 NOTE — Assessment & Plan Note (Signed)
Follow up carotid artery doppler planned in Dec, 2015.

## 2014-10-04 NOTE — Assessment & Plan Note (Addendum)
Patient experienced transient leg symptoms which he initially attributed to taking pravastatin. Patient reports his symptoms are improved after starting protein supplement. Patient strongly encouraged to restart pravastatin especially considering his history of carotid artery stenosis. Arrange follow-up fasting lipid panel and LFTs. Goal LDL less than 100.  Check CK.

## 2014-10-10 ENCOUNTER — Encounter (HOSPITAL_COMMUNITY): Payer: Self-pay | Admitting: *Deleted

## 2014-10-10 ENCOUNTER — Emergency Department (HOSPITAL_COMMUNITY)
Admission: EM | Admit: 2014-10-10 | Discharge: 2014-10-10 | Disposition: A | Discharge status: Home / Self Care | Payer: Medicare Other | Attending: Emergency Medicine | Admitting: Emergency Medicine

## 2014-10-10 ENCOUNTER — Emergency Department (HOSPITAL_COMMUNITY): Payer: Medicare Other

## 2014-10-10 DIAGNOSIS — M791 Myalgia: Secondary | ICD-10-CM | POA: Insufficient documentation

## 2014-10-10 DIAGNOSIS — R509 Fever, unspecified: Secondary | ICD-10-CM

## 2014-10-10 DIAGNOSIS — Z9889 Other specified postprocedural states: Secondary | ICD-10-CM | POA: Diagnosis not present

## 2014-10-10 DIAGNOSIS — N12 Tubulo-interstitial nephritis, not specified as acute or chronic: Secondary | ICD-10-CM | POA: Diagnosis not present

## 2014-10-10 DIAGNOSIS — Z8701 Personal history of pneumonia (recurrent): Secondary | ICD-10-CM | POA: Diagnosis not present

## 2014-10-10 DIAGNOSIS — I2510 Atherosclerotic heart disease of native coronary artery without angina pectoris: Secondary | ICD-10-CM | POA: Insufficient documentation

## 2014-10-10 DIAGNOSIS — Z87891 Personal history of nicotine dependence: Secondary | ICD-10-CM | POA: Insufficient documentation

## 2014-10-10 DIAGNOSIS — Z86010 Personal history of colonic polyps: Secondary | ICD-10-CM | POA: Diagnosis not present

## 2014-10-10 DIAGNOSIS — I10 Essential (primary) hypertension: Secondary | ICD-10-CM | POA: Insufficient documentation

## 2014-10-10 DIAGNOSIS — Z79899 Other long term (current) drug therapy: Secondary | ICD-10-CM | POA: Insufficient documentation

## 2014-10-10 DIAGNOSIS — M1990 Unspecified osteoarthritis, unspecified site: Secondary | ICD-10-CM | POA: Diagnosis not present

## 2014-10-10 DIAGNOSIS — E785 Hyperlipidemia, unspecified: Secondary | ICD-10-CM | POA: Diagnosis not present

## 2014-10-10 DIAGNOSIS — Z792 Long term (current) use of antibiotics: Secondary | ICD-10-CM | POA: Insufficient documentation

## 2014-10-10 LAB — URINALYSIS, ROUTINE W REFLEX MICROSCOPIC
Bilirubin Urine: NEGATIVE
GLUCOSE, UA: NEGATIVE mg/dL
Ketones, ur: NEGATIVE mg/dL
Nitrite: POSITIVE — AB
PROTEIN: NEGATIVE mg/dL
Specific Gravity, Urine: 1.014 (ref 1.005–1.030)
Urobilinogen, UA: 0.2 mg/dL (ref 0.0–1.0)
pH: 6.5 (ref 5.0–8.0)

## 2014-10-10 LAB — COMPREHENSIVE METABOLIC PANEL
ALT: 16 U/L (ref 0–53)
AST: 22 U/L (ref 0–37)
Albumin: 3.7 g/dL (ref 3.5–5.2)
Alkaline Phosphatase: 75 U/L (ref 39–117)
Anion gap: 14 (ref 5–15)
BILIRUBIN TOTAL: 0.4 mg/dL (ref 0.3–1.2)
BUN: 14 mg/dL (ref 6–23)
CO2: 26 meq/L (ref 19–32)
Calcium: 9.4 mg/dL (ref 8.4–10.5)
Chloride: 97 mEq/L (ref 96–112)
Creatinine, Ser: 0.99 mg/dL (ref 0.50–1.35)
GFR calc Af Amer: 86 mL/min — ABNORMAL LOW (ref >90)
GFR, EST NON AFRICAN AMERICAN: 74 mL/min — AB (ref >90)
Glucose, Bld: 112 mg/dL — ABNORMAL HIGH (ref 70–99)
Potassium: 4.4 mEq/L (ref 3.7–5.3)
SODIUM: 137 meq/L (ref 137–147)
Total Protein: 7.9 g/dL (ref 6.0–8.3)

## 2014-10-10 LAB — URINE MICROSCOPIC-ADD ON

## 2014-10-10 LAB — CBC WITH DIFFERENTIAL/PLATELET
BASOS ABS: 0 10*3/uL (ref 0.0–0.1)
Basophils Relative: 0 % (ref 0–1)
EOS ABS: 0.1 10*3/uL (ref 0.0–0.7)
EOS PCT: 1 % (ref 0–5)
HCT: 40.9 % (ref 39.0–52.0)
Hemoglobin: 14 g/dL (ref 13.0–17.0)
LYMPHS ABS: 1.9 10*3/uL (ref 0.7–4.0)
Lymphocytes Relative: 15 % (ref 12–46)
MCH: 30.4 pg (ref 26.0–34.0)
MCHC: 34.2 g/dL (ref 30.0–36.0)
MCV: 88.9 fL (ref 78.0–100.0)
Monocytes Absolute: 0.9 10*3/uL (ref 0.1–1.0)
Monocytes Relative: 6 % (ref 3–12)
Neutro Abs: 10.4 10*3/uL — ABNORMAL HIGH (ref 1.7–7.7)
Neutrophils Relative %: 78 % — ABNORMAL HIGH (ref 43–77)
PLATELETS: 240 10*3/uL (ref 150–400)
RBC: 4.6 MIL/uL (ref 4.22–5.81)
RDW: 13.3 % (ref 11.5–15.5)
WBC: 13.3 10*3/uL — ABNORMAL HIGH (ref 4.0–10.5)

## 2014-10-10 LAB — INFLUENZA PANEL BY PCR (TYPE A & B)
H1N1 flu by pcr: NOT DETECTED
Influenza A By PCR: NEGATIVE
Influenza B By PCR: NEGATIVE

## 2014-10-10 MED ORDER — CEFPODOXIME PROXETIL 200 MG PO TABS: 100.0000 mg | ORAL_TABLET | Freq: Once | ORAL | Status: DC

## 2014-10-10 MED ORDER — SODIUM CHLORIDE 0.9 % IV BOLUS (SEPSIS)
1000.0000 mL | Freq: Once | INTRAVENOUS | Status: AC
  Administered 2014-10-10: 1000 mL via INTRAVENOUS

## 2014-10-10 MED ORDER — CEFPODOXIME PROXETIL 100 MG PO TABS: 100.0000 mg | ORAL_TABLET | Freq: Two times a day (BID) | ORAL | Status: DC

## 2014-10-10 MED ORDER — LEVOFLOXACIN 750 MG PO TABS: 750.0000 mg | ORAL_TABLET | Freq: Every day | ORAL | Status: DC

## 2014-10-10 MED ORDER — CEFPODOXIME PROXETIL 200 MG PO TABS: 200.0000 mg | ORAL_TABLET | Freq: Once | ORAL | Status: DC

## 2014-10-10 NOTE — ED Notes (Signed)
Pt reports having right side back pain x 2 days with fever and "just doesn't feel well." having pain with urination and difficulty urinating but denies recent cough.

## 2014-10-10 NOTE — Discharge Instructions (Signed)
Pyelonephritis, Adult °Pyelonephritis is a kidney infection. In general, there are 2 main types of pyelonephritis: °· Infections that come on quickly without any warning (acute pyelonephritis). °· Infections that persist for a long period of time (chronic pyelonephritis). °CAUSES  °Two main causes of pyelonephritis are: °· Bacteria traveling from the bladder to the kidney. This is a problem especially in pregnant women. The urine in the bladder can become filled with bacteria from multiple causes, including: °¨ Inflammation of the prostate gland (prostatitis). °¨ Sexual intercourse in females. °¨ Bladder infection (cystitis). °· Bacteria traveling from the bloodstream to the tissue part of the kidney. °Problems that may increase your risk of getting a kidney infection include: °· Diabetes. °· Kidney stones or bladder stones. °· Cancer. °· Catheters placed in the bladder. °· Other abnormalities of the kidney or ureter. °SYMPTOMS  °· Abdominal pain. °· Pain in the side or flank area. °· Fever. °· Chills. °· Upset stomach. °· Blood in the urine (dark urine). °· Frequent urination. °· Strong or persistent urge to urinate. °· Burning or stinging when urinating. °DIAGNOSIS  °Your caregiver may diagnose your kidney infection based on your symptoms. A urine sample may also be taken. °TREATMENT  °In general, treatment depends on how severe the infection is.  °· If the infection is mild and caught early, your caregiver may treat you with oral antibiotics and send you home. °· If the infection is more severe, the bacteria may have gotten into the bloodstream. This will require intravenous (IV) antibiotics and a hospital stay. Symptoms may include: °¨ High fever. °¨ Severe flank pain. °¨ Shaking chills. °· Even after a hospital stay, your caregiver may require you to be on oral antibiotics for a period of time. °· Other treatments may be required depending upon the cause of the infection. °HOME CARE INSTRUCTIONS  °· Take your  antibiotics as directed. Finish them even if you start to feel better. °· Make an appointment to have your urine checked to make sure the infection is gone. °· Drink enough fluids to keep your urine clear or pale yellow. °· Take medicines for the bladder if you have urgency and frequency of urination as directed by your caregiver. °SEEK IMMEDIATE MEDICAL CARE IF:  °· You have a fever or persistent symptoms for more than 2-3 days. °· You have a fever and your symptoms suddenly get worse. °· You are unable to take your antibiotics or fluids. °· You develop shaking chills. °· You experience extreme weakness or fainting. °· There is no improvement after 2 days of treatment. °MAKE SURE YOU: °· Understand these instructions. °· Will watch your condition. °· Will get help right away if you are not doing well or get worse. °Document Released: 11/19/2005 Document Revised: 05/20/2012 Document Reviewed: 04/25/2011 °ExitCare® Patient Information ©2015 [employer]. This information is not intended to replace advice given to you by your health care provider. Make sure you discuss any questions you have with your health care provider. ° °

## 2014-10-10 NOTE — ED Provider Notes (Signed)
CSN: [id number]     Arrival date & time 10/10/14  1513 History   First MD Initiated Contact with Patient 10/10/14 1524     Chief Complaint  Patient presents with  . Fever  . Back Pain     (Consider location/radiation/quality/duration/timing/severity/associated sxs/prior Treatment) Patient is a 80 y.o. male presenting with general illness.  Illness Location:  Generalized, back Quality:  Aching, fever Severity:  Moderate Onset quality:  Gradual Duration:  2 days Timing:  Constant Progression:  Worsening Chronicity:  New Context:  Did not take flu shot this year Relieved by:  Nothing Worsened by:  Nothing Associated symptoms: fever (tmax 101) and myalgias   Associated symptoms: no abdominal pain, no cough, no diarrhea, no loss of consciousness, no nausea and no vomiting   Associated symptoms comment:  Dysuria   Past Medical History  Diagnosis Date  . Hyperlipidemia     takes Pravastatin every other day  . Colon polyps   . Pneumonia     back in Feb 2014  . Arthritis     neck   . Calcification of coronary artery   . Hypertension   . Carotid artery occlusion    Past Surgical History  Procedure Laterality Date  . Inguinal hernia repair    . Rotator cuff repair Bilateral 1998, 2001  . Pilonidal cyst drainage    . Tonsillectomy    . Colonoscopy    . Endarterectomy Right 11/10/2013    Procedure: ENDARTERECTOMY CAROTID;  Surgeon: Conrad [place], MD;  Location: [place];  Service: Vascular;  Laterality: Right;  . Patch angioplasty Right 11/10/2013    Procedure: PATCH ANGIOPLASTY;  Surgeon: Conrad Pringle, MD;  Location: [place];  Service: Vascular;  Laterality: Right;  . Nm myocar perf wall motion  04/2011    bruce myoview - perfusion defect in inferior myocardium (diaphragmatic attenuation), remaining myocardium with normal perfusion, EF 67%  . Cardiac catheterization  06/09/1999    mild CAD (LAD, diagonal, RCA) - Dr. Loni Muse. [place]  . Carotid doppler  02/2012    60-79% RICA stenosis,  82-70% LICA stenosis (prior to carotid endarterectomy)  . Carotid endarterectomy Right 11-10-13    cea   Family History  Problem Relation Age of Onset  . Prostate cancer Son   . Stroke Father   . Stroke Brother   . Cancer Brother     spine  . Hypertension Mother    History  Substance Use Topics  . Smoking status: Former Smoker    Types: Cigarettes    Quit date: 12/03/1972  . Smokeless tobacco: Never Used  . Alcohol Use: 4.2 oz/week    7 Glasses of wine per week     Comment: wine nightly    Review of Systems  Constitutional: Positive for fever (tmax 101).  Respiratory: Negative for cough.   Gastrointestinal: Negative for nausea, vomiting, abdominal pain and diarrhea.  Musculoskeletal: Positive for myalgias.  Neurological: Negative for loss of consciousness.  All other systems reviewed and are negative.     Allergies  Lipitor and Vantin  Home Medications   Prior to Admission medications   Medication Sig Start Date End Date Taking? Authorizing Provider  ALPHA LIPOIC ACID PO Take 300 mg by mouth daily.   Yes Historical Provider, MD  Cholecalciferol (VITAMIN D3) 2000 UNITS capsule Take 2,000 Units by mouth daily. Take 2,000 Units by mouth daily.   Yes Historical Provider, MD  Coenzyme Q10-Levocarnitine 100-20 MG CAPS Take 1 capsule by mouth daily. Take  by mouth.   Yes Historical Provider, MD  Glucosamine-Chondroitin 500-400 MG CAPS Take 1 capsule by mouth daily. Take by mouth.   Yes Historical Provider, MD  Magnesium 500 MG TABS Take 1 tablet by mouth daily.   Yes Historical Provider, MD  Multiple Vitamin (MULTIVITAMIN) tablet Take 0.5 tablets by mouth daily.    Yes Historical Provider, MD  OMEGA-3 1000 MG CAPS Take 4,000 mg by mouth daily.   Yes Historical Provider, MD  pravastatin (PRAVACHOL) 40 MG tablet TAKE 1TABLET BY MOUTH AT BEDTIME. Patient taking differently: Take 20 mg by mouth daily. TAKE 1TABLET BY MOUTH AT BEDTIME. 10/04/14  Yes Doe-Hyun R Shawna Orleans, DO  Probiotic  Product (PROBIOTIC DAILY PO) Take 1 capsule by mouth daily.   Yes Historical Provider, MD  triamcinolone cream (KENALOG) 0.1 % Apply 1 application topically daily as needed (For rash).  04/21/14  Yes Historical Provider, MD  levofloxacin (LEVAQUIN) 750 MG tablet Take 1 tablet (750 mg total) by mouth daily. 10/10/14   Debby Freiberg, MD   BP 141/51 mmHg  Pulse 81  Temp(Src) 101.5 F (38.6 C) (Oral)  Resp 19  Ht 5\' 11"  (1.803 m)  Wt 185 lb (83.915 kg)  BMI 25.81 kg/m2  SpO2 93% Physical Exam  Constitutional: He is oriented to person, place, and time. He appears well-developed and well-nourished.  HENT:  Head: Normocephalic and atraumatic.  Eyes: Conjunctivae and EOM are normal.  Neck: Normal range of motion. Neck supple.  Cardiovascular: Normal rate, regular rhythm and normal heart sounds.   Pulmonary/Chest: Effort normal and breath sounds normal. No respiratory distress.  Abdominal: He exhibits no distension. There is no tenderness. There is no rebound, no guarding and no CVA tenderness.  Musculoskeletal: Normal range of motion.  Neurological: He is alert and oriented to person, place, and time.  Skin: Skin is warm and dry.  Vitals reviewed.   ED Course  Procedures (including critical care time) Labs Review Labs Reviewed  COMPREHENSIVE METABOLIC PANEL - Abnormal; Notable for the following:    Glucose, Bld 112 (*)    GFR calc non Af Amer 74 (*)    GFR calc Af Amer 86 (*)    All other components within normal limits  CBC WITH DIFFERENTIAL - Abnormal; Notable for the following:    WBC 13.3 (*)    Neutrophils Relative % 78 (*)    Neutro Abs 10.4 (*)    All other components within normal limits  URINALYSIS, ROUTINE W REFLEX MICROSCOPIC - Abnormal; Notable for the following:    APPearance CLOUDY (*)    Hgb urine dipstick MODERATE (*)    Nitrite POSITIVE (*)    Leukocytes, UA SMALL (*)    All other components within normal limits  URINE MICROSCOPIC-ADD ON - Abnormal; Notable for  the following:    Bacteria, UA MANY (*)    All other components within normal limits  URINE CULTURE  INFLUENZA PANEL BY PCR (TYPE A & B, H1N1)    Imaging Review No results found.   EKG Interpretation None      MDM   Final diagnoses:  Fever  Pyelonephritis    79 y.o. male with pertinent PMH of HLD (not on therapy) presents with myalgias, fever without respiratory symptoms.  Physical exam and vitals on arrival.  UA with signs of UTI.  Likely early pyelonephritis.  DC home in stable condition as he has stable vitals, is taking PO, and is well appearing.  Flu negative.   1. Pyelonephritis  2. Fever   3. Fever         Debby Freiberg, MD 10/13/14 [phone]

## 2014-10-12 LAB — URINE CULTURE: Colony Count: >=100000

## 2014-10-13 ENCOUNTER — Telehealth (HOSPITAL_BASED_OUTPATIENT_CLINIC_OR_DEPARTMENT_OTHER): Payer: Self-pay | Admitting: Emergency Medicine

## 2014-10-13 NOTE — Telephone Encounter (Signed)
Post ED Visit - Positive Culture Follow-up  Culture report reviewed by antimicrobial stewardship pharmacist: []  Wes [place], Pharm.D., BCPS []  Heide Guile, Pharm.D., BCPS []  Alycia Rossetti, Pharm.D., BCPS []  [place], Pharm.D., BCPS, AAHIVP []  Legrand Como, Pharm.D., BCPS, AAHIVP []  Elicia Lamp, Pharm.D.   Positive urine culture e. coli Treated with cefpodoxime, organism sensitive to the same and no further patient follow-up is required at this time.  Hazle Nordmann 10/13/2014, 4:21 PM

## 2014-11-30 ENCOUNTER — Other Ambulatory Visit (HOSPITAL_COMMUNITY): Payer: Medicare Other

## 2014-11-30 ENCOUNTER — Ambulatory Visit: Payer: Medicare Other | Admitting: Family

## 2014-12-09 ENCOUNTER — Encounter: Payer: Self-pay | Admitting: Family

## 2014-12-10 ENCOUNTER — Ambulatory Visit (INDEPENDENT_AMBULATORY_CARE_PROVIDER_SITE_OTHER): Payer: Medicare Other | Admitting: Family

## 2014-12-10 ENCOUNTER — Encounter: Payer: Self-pay | Admitting: Family

## 2014-12-10 ENCOUNTER — Ambulatory Visit (HOSPITAL_COMMUNITY)
Admission: RE | Admit: 2014-12-10 | Discharge: 2014-12-10 | Disposition: A | Discharge status: Home / Self Care | Payer: Medicare Other | Source: Ambulatory Visit | Attending: Family | Admitting: Family

## 2014-12-10 VITALS — BP 146/70 | HR 64 | Resp 16 | Ht 72.0 in | Wt 185.0 lb

## 2014-12-10 DIAGNOSIS — Z48812 Encounter for surgical aftercare following surgery on the circulatory system: Secondary | ICD-10-CM

## 2014-12-10 DIAGNOSIS — I714 Abdominal aortic aneurysm, without rupture: Secondary | ICD-10-CM | POA: Diagnosis not present

## 2014-12-10 DIAGNOSIS — I6523 Occlusion and stenosis of bilateral carotid arteries: Secondary | ICD-10-CM

## 2014-12-10 DIAGNOSIS — Z9889 Other specified postprocedural states: Secondary | ICD-10-CM

## 2014-12-10 DIAGNOSIS — I6521 Occlusion and stenosis of right carotid artery: Secondary | ICD-10-CM

## 2014-12-10 NOTE — Patient Instructions (Signed)
Stroke Prevention Some medical conditions and behaviors are associated with an increased chance of having a stroke. You may prevent a stroke by making healthy choices and managing medical conditions. HOW CAN I REDUCE MY RISK OF HAVING A STROKE?   Stay physically active. Get at least 30 minutes of activity on most or all days.  Do not smoke. It may also be helpful to avoid exposure to secondhand smoke.  Limit alcohol use. Moderate alcohol use is considered to be:  No more than 2 drinks per day for men.  No more than 1 drink per day for nonpregnant women.  Eat healthy foods. This involves:  Eating 5 or more servings of fruits and vegetables a day.  Making dietary changes that address high blood pressure (hypertension), high cholesterol, diabetes, or obesity.  Manage your cholesterol levels.  Making food choices that are high in fiber and low in saturated fat, trans fat, and cholesterol may control cholesterol levels.  Take any prescribed medicines to control cholesterol as directed by your health care provider.  Manage your diabetes.  Controlling your carbohydrate and sugar intake is recommended to manage diabetes.  Take any prescribed medicines to control diabetes as directed by your health care provider.  Control your hypertension.  Making food choices that are low in salt (sodium), saturated fat, trans fat, and cholesterol is recommended to manage hypertension.  Take any prescribed medicines to control hypertension as directed by your health care provider.  Maintain a healthy weight.  Reducing calorie intake and making food choices that are low in sodium, saturated fat, trans fat, and cholesterol are recommended to manage weight.  Stop drug abuse.  Avoid taking birth control pills.  Talk to your health care provider about the risks of taking birth control pills if you are over 35 years old, smoke, get migraines, or have ever had a blood clot.  Get evaluated for sleep  disorders (sleep apnea).  Talk to your health care provider about getting a sleep evaluation if you snore a lot or have excessive sleepiness.  Take medicines only as directed by your health care provider.  For some people, aspirin or blood thinners (anticoagulants) are helpful in reducing the risk of forming abnormal blood clots that can lead to stroke. If you have the irregular heart rhythm of atrial fibrillation, you should be on a blood thinner unless there is a good reason you cannot take them.  Understand all your medicine instructions.  Make sure that other conditions (such as anemia or atherosclerosis) are addressed. SEEK IMMEDIATE MEDICAL CARE IF:   You have sudden weakness or numbness of the face, arm, or leg, especially on one side of the body.  Your face or eyelid droops to one side.  You have sudden confusion.  You have trouble speaking (aphasia) or understanding.  You have sudden trouble seeing in one or both eyes.  You have sudden trouble walking.  You have dizziness.  You have a loss of balance or coordination.  You have a sudden, severe headache with no known cause.  You have new chest pain or an irregular heartbeat. Any of these symptoms may represent a serious problem that is an emergency. Do not wait to see if the symptoms will go away. Get medical help at once. Call your local emergency services (911 in [place] Do not drive yourself to the hospital. Document Released: 12/27/2004 Document Revised: 04/05/2014 Document Reviewed: 05/22/2013 Exit[employer] Patient Information 2015 [employer]. This information is not intended to replace advice given   to you by your health care provider. Make sure you discuss any questions you have with your health care provider.  

## 2014-12-10 NOTE — Progress Notes (Signed)
Established Carotid Patient   History of Present Illness  Sean Miranda is a 81 y.o. male patient of Dr. Bridgett Larsson who is s/p R CEA (Date: 11/10/13). He returns today for surveillance and follow up.  Patient has Negative history of TIA or stroke symptom. The patient denies amaurosis fugax or monocular blindness. The patient denies facial drooping. Pt. denies hemiplegia. The patient denies receptive or expressive aphasia. Pt. denies extremity weakness. He denies tingling, numbness, pain, or weakness in either UE, denies dizziness. He denies claudication symptoms with walking, denies no healing wounds.  Pt denies New Medical or Surgical History. He exercises 45 minutes at least 3-4 days/week. He had a stress test in 2015, Dr. Debara Pickett. He denies any history of MI or any cardiac problems.  Pt Diabetic: No Pt smoker: former smoker, quit in 1974  Pt meds include: Statin : Yes ASA: Yes, 81 mg qod Other anticoagulants/antiplatelets: no   Past Medical History  Diagnosis Date  . Hyperlipidemia     takes Pravastatin every other day  . Colon polyps   . Pneumonia     back in Feb 2014  . Arthritis     neck   . Calcification of coronary artery   . Hypertension   . Carotid artery occlusion     Social History History  Substance Use Topics  . Smoking status: Former Smoker    Types: Cigarettes    Quit date: 12/03/1972  . Smokeless tobacco: Never Used  . Alcohol Use: 4.2 oz/week    7 Glasses of wine per week     Comment: wine nightly    Family History Family History  Problem Relation Age of Onset  . Prostate cancer Son   . Stroke Father   . Stroke Brother   . Cancer Brother     spine  . Hypertension Mother     Surgical History Past Surgical History  Procedure Laterality Date  . Inguinal hernia repair    . Rotator cuff repair Bilateral 1998, 2001  . Pilonidal cyst drainage    . Tonsillectomy    . Colonoscopy    . Endarterectomy Right 11/10/2013    Procedure:  ENDARTERECTOMY CAROTID;  Surgeon: Conrad West Haverstraw, MD;  Location: [place];  Service: Vascular;  Laterality: Right;  . Patch angioplasty Right 11/10/2013    Procedure: PATCH ANGIOPLASTY;  Surgeon: Conrad [place], MD;  Location: [place];  Service: Vascular;  Laterality: Right;  . Nm myocar perf wall motion  04/2011    bruce myoview - perfusion defect in inferior myocardium (diaphragmatic attenuation), remaining myocardium with normal perfusion, EF 67%  . Cardiac catheterization  06/09/1999    mild CAD (LAD, diagonal, RCA) - Dr. Loni Muse. [place]  . Carotid doppler  02/2012    60-79% RICA stenosis, 17-79% LICA stenosis (prior to carotid endarterectomy)  . Carotid endarterectomy Right 11-10-13    cea    Allergies  Allergen Reactions  . Lipitor [Atorvastatin Calcium] Other (See Comments)    Muscle pain  . Vantin [Cefpodoxime] Rash    Current Outpatient Prescriptions  Medication Sig Dispense Refill  . ALPHA LIPOIC ACID PO Take 300 mg by mouth daily.    . Cholecalciferol (VITAMIN D3) 2000 UNITS capsule Take 2,000 Units by mouth daily. Take 2,000 Units by mouth daily.    . Coenzyme Q10-Levocarnitine 100-20 MG CAPS Take 1 capsule by mouth daily. Take by mouth.    . Glucosamine-Chondroitin 500-400 MG CAPS Take 1 capsule by mouth daily. Take by mouth.    Marland Kitchen  Magnesium 500 MG TABS Take 1 tablet by mouth daily.    . Multiple Vitamin (MULTIVITAMIN) tablet Take 0.5 tablets by mouth daily.     . OMEGA-3 1000 MG CAPS Take 4,000 mg by mouth daily.    . pravastatin (PRAVACHOL) 40 MG tablet TAKE 1TABLET BY MOUTH AT BEDTIME. (Patient taking differently: Take 20 mg by mouth daily. TAKE 1TABLET BY MOUTH AT BEDTIME.) 90 tablet 3  . Probiotic Product (PROBIOTIC DAILY PO) Take 1 capsule by mouth daily.    Marland Kitchen triamcinolone cream (KENALOG) 0.1 % Apply 1 application topically daily as needed (For rash).     Marland Kitchen levofloxacin (LEVAQUIN) 750 MG tablet Take 1 tablet (750 mg total) by mouth daily. (Patient not taking: Reported on 12/10/2014) 7  tablet 0   No current facility-administered medications for this visit.    Review of Systems : See HPI for pertinent positives and negatives.  Physical Examination  Filed Vitals:   12/10/14 1027 12/10/14 1031  BP: 144/76 146/70  Pulse: 62 64  Resp: 16   Height: 6' (1.829 m)   Weight: 185 lb (83.915 kg)    Body mass index is 25.08 kg/(m^2).  General: A&O x 3, WD  Eyes: PERRLA  Pulmonary: Sym exp, good air movt, CTAB, no rales, rhonchi, & wheezing  Cardiac: RRR, Nl S1, S2, low grade Murmur detected  Vascular:  Vessel  Right  Left   Radial  2+ Palpable  2+Palpable   Carotid  Palpable, without bruit  Palpable, without bruit   Popliteal  Not palpable  Not palpable   PT  2+Palpable  2+ Palpable   DP  2+Palpable  Not Palpable    Gastrointestinal: soft, NTND, -G/R, - HSM, - palpable masses, - CVAT B Musculoskeletal: M/S 5/5 throughout , Extremities without ischemic changes  Neurologic: CN 2-12 intact , Pain and light touch intact in extremities , Motor exam as listed above  Psychiatric: Judgment intact, Mood & affect appropriate for pt's clinical situation  Dermatologic: See M/S exam for extremity exam, no rashes otherwise noted    Non-Invasive Vascular Imaging CAROTID DUPLEX 12/10/2014   CEREBROVASCULAR DUPLEX EVALUATION    INDICATION: Carotid artery disease     PREVIOUS INTERVENTION(S): Right carotid endarterectomy 11/10/2013.    DUPLEX EXAM:     RIGHT  LEFT  Peak Systolic Velocities (cm/s) End Diastolic Velocities (cm/s) Plaque LOCATION Peak Systolic Velocities (cm/s) End Diastolic Velocities (cm/s) Plaque  92 21  CCA PROXIMAL 118 23   119 24  CCA MID 108 27   106 21  CCA DISTAL 97 25   144 16  ECA 96 16   116 28  ICA PROXIMAL 98 22   178 51 HM ICA MID 93 25   104 17  ICA DISTAL 96 27      ICA / CCA Ratio (PSV) 0.90  Antegrade/resistive Vertebral Flow Antegrade    Brachial Systolic Pressure (mmHg)    Brachial Artery Waveforms      Plaque Morphology:  HM = Homogeneous, HT = Heterogeneous, CP = Calcific Plaque, SP = Smooth Plaque, IP = Irregular Plaque     ADDITIONAL FINDINGS: Elevated velocities noted in the innominate artery.    IMPRESSION: Patent right carotid endarterectomy site with evidence of mild hyperplasia. Elevated velocities present at the distal patch site which appears to be due to a change in vessel diameter and hyperplasia. Velocities suggest 40-59%. Widely patent left internal carotid artery with no evidence of stenosis.    Compared to the previous exam:  No significant change in comparison to the last exam on 05/28/2014.      Assessment: Sean Miranda is a 81 y.o. male who is s/p R CEA (Date: 11/10/13). The patient has no history of stroke or TIA. Today's carotid Duplex reveals a patent right carotid endarterectomy site with evidence of mild hyperplasia. Elevated velocities present at the distal patch site which appears to be due to a change in vessel diameter and hyperplasia. Velocities suggest 40-59%. Widely patent left internal carotid artery with no evidence of stenosis. No significant change in comparison to the last exam on 05/28/2014. Face to face time with patient was 20 minutes. Over 50% of this time was spent on counseling and coordination of care.    Plan: Follow-up in 1 year with Carotid Duplex.   I discussed in depth with the patient the nature of atherosclerosis, and emphasized the importance of maximal medical management including strict control of blood pressure, blood glucose, and lipid levels, obtaining regular exercise, and continued cessation of smoking.  The patient is aware that without maximal medical management the underlying atherosclerotic disease process will progress, limiting the benefit of any interventions. The patient was given information about stroke prevention and what symptoms should prompt the patient to seek immediate medical care. Thank you for allowing [place] to  participate in this patient's care.  Clemon Chambers, RN, MSN, FNP-C [facility] Office: [phone]  Clinic Physician: Bridgett Larsson  12/10/2014 10:42 AM

## 2014-12-13 ENCOUNTER — Encounter: Payer: Self-pay | Admitting: Family Medicine

## 2014-12-13 ENCOUNTER — Ambulatory Visit (INDEPENDENT_AMBULATORY_CARE_PROVIDER_SITE_OTHER): Payer: Medicare Other | Admitting: Family Medicine

## 2014-12-13 VITALS — BP 120/80 | Temp 99.2°F | Wt 192.0 lb

## 2014-12-13 DIAGNOSIS — J069 Acute upper respiratory infection, unspecified: Secondary | ICD-10-CM | POA: Insufficient documentation

## 2014-12-13 MED ORDER — HYDROCODONE-HOMATROPINE 5-1.5 MG/5ML PO SYRP: ORAL_SOLUTION | ORAL | Status: DC

## 2014-12-13 NOTE — Patient Instructions (Signed)
Drink lots of liquids  Stop the Mucinex  Hydromet.........Marland Kitchen 1/2 teaspoon 3 times daily when necessary for cough and cold symptoms

## 2014-12-13 NOTE — Progress Notes (Signed)
   Subjective:    Patient ID: Sean Miranda, male    DOB: 1930/11/12, 81 y.o.   MRN: [id number]  HPI Sean Miranda is an 81 year old male who comes in with a four-day history of head congestion sore throat postnasal drip  His wife accompanies him who says she had the same symptoms and was given an antibiotic.   Review of Systems    review of systems negative Objective:   Physical Exam  Well-developed well-nourished male no acute distress vital signs stable is afebrile HEENT were negative neck was supple no adenopathy      Assessment & Plan:  Viral syndrome........ Explained antibiotics do not help a virus........ Hydromet lots of liquids

## 2014-12-17 ENCOUNTER — Telehealth: Payer: Self-pay | Admitting: Internal Medicine

## 2014-12-17 ENCOUNTER — Encounter (HOSPITAL_COMMUNITY): Payer: Self-pay | Admitting: *Deleted

## 2014-12-17 ENCOUNTER — Emergency Department (HOSPITAL_COMMUNITY)
Admission: EM | Admit: 2014-12-17 | Discharge: 2014-12-17 | Disposition: A | Discharge status: Home / Self Care | Payer: Medicare Other | Attending: Emergency Medicine | Admitting: Emergency Medicine

## 2014-12-17 ENCOUNTER — Emergency Department (HOSPITAL_COMMUNITY): Payer: Medicare Other

## 2014-12-17 DIAGNOSIS — I10 Essential (primary) hypertension: Secondary | ICD-10-CM | POA: Insufficient documentation

## 2014-12-17 DIAGNOSIS — Z87891 Personal history of nicotine dependence: Secondary | ICD-10-CM | POA: Insufficient documentation

## 2014-12-17 DIAGNOSIS — E785 Hyperlipidemia, unspecified: Secondary | ICD-10-CM | POA: Diagnosis not present

## 2014-12-17 DIAGNOSIS — Z8701 Personal history of pneumonia (recurrent): Secondary | ICD-10-CM | POA: Insufficient documentation

## 2014-12-17 DIAGNOSIS — Z9889 Other specified postprocedural states: Secondary | ICD-10-CM | POA: Diagnosis not present

## 2014-12-17 DIAGNOSIS — Z86010 Personal history of colonic polyps: Secondary | ICD-10-CM | POA: Diagnosis not present

## 2014-12-17 DIAGNOSIS — R05 Cough: Secondary | ICD-10-CM

## 2014-12-17 DIAGNOSIS — J209 Acute bronchitis, unspecified: Secondary | ICD-10-CM

## 2014-12-17 DIAGNOSIS — Z79899 Other long term (current) drug therapy: Secondary | ICD-10-CM | POA: Diagnosis not present

## 2014-12-17 DIAGNOSIS — R059 Cough, unspecified: Secondary | ICD-10-CM

## 2014-12-17 DIAGNOSIS — M1990 Unspecified osteoarthritis, unspecified site: Secondary | ICD-10-CM | POA: Diagnosis not present

## 2014-12-17 MED ORDER — LEVOFLOXACIN 750 MG PO TABS: 750.0000 mg | ORAL_TABLET | Freq: Every day | ORAL | Status: DC

## 2014-12-17 MED ORDER — ALBUTEROL SULFATE HFA 108 (90 BASE) MCG/ACT IN AERS
2.0000 | INHALATION_SPRAY | RESPIRATORY_TRACT | Status: DC | PRN
  Filled 2014-12-17: qty 6.7

## 2014-12-17 NOTE — Telephone Encounter (Signed)
[facility] - Client [facility] Patient Name: Sean Miranda DOB: [birth year] Nurse Assessment Nurse: Markus Daft, RN, [place] Date/Time ([place] Time): 12/17/2014 10:56:11 AM Confirm and document reason for call. If symptomatic, describe symptoms. ---Caller states husband has fever 100.2 by mouth this AM, spitting up blood - more than a tinge. He's had a productive cough. Mild chest tightness only with coughing. He took 325 mg x 2 tabs this AM d/t "feeling bad."-- Seen in office recently on Monday by Dr. Sherren Mocha and given Hydromet syrup for cough and sore throat. No x-ray done. -- H/O pneumonia 2-3 years ago. Has the patient traveled out of the country within the last 30 days? ---No Does the patient require triage? ---Yes Related visit to physician within the last 2 weeks? ---Yes Does the PT have any chronic conditions? (i.e. diabetes, asthma, etc.) ---Yes List chronic conditions. ---pneumonia 2-3 years ago; ASA 325 mg daily - CAD and h/o Carotid Endarterectomy Guidelines Guideline Title Affirmed Question Affirmed Notes Coughing Up Blood Chest pain Final Disposition User Go to ED Now Markus Daft, RN, [place]

## 2014-12-17 NOTE — Discharge Instructions (Signed)
Please read and follow all provided instructions.  Your diagnoses today include:  1. Acute bronchitis, unspecified organism   2. Cough     Tests performed today include:  Chest x-ray - does not show any pneumonia  Vital signs. See below for your results today.   Medications prescribed:   Levoquin - antibiotic for respiratory infection  You have been prescribed an antibiotic medicine: take the entire course of medicine even if you are feeling better. Stopping early can cause the antibiotic not to work.  Take any prescribed medications only as directed.  Home care instructions:  Follow any educational materials contained in this packet.  Follow-up instructions: Please follow-up with your primary care provider in the next 3 days for further evaluation of your symptoms and a recheck if you are not feeling better.   Return instructions:   Please return to the Emergency Department if you experience worsening symptoms.  Please return with worsening wheezing, shortness of breath, or difficulty breathing.  Return with worsening amount of blood in sputum or if you develop chest pain.  Return with persistent fever above 101F.   Please return if you have any other emergent concerns.  Additional Information:  Your vital signs today were: BP 146/54 mmHg   Pulse 69   Temp(Src) 98.4 F (36.9 C) (Oral)   Resp 20   SpO2 99% If your blood pressure (BP) was elevated above 135/85 this visit, please have this repeated by your doctor within one month. --------------

## 2014-12-17 NOTE — ED Provider Notes (Signed)
CSN: [id number]     Arrival date & time 12/17/14  1201 History   None    Chief Complaint  Patient presents with  . Cough     (Consider location/radiation/quality/duration/timing/severity/associated sxs/prior Treatment) HPI Comments: Patient presents with one-week history of productive cough, upper respiratory tract infection including nasal congestion and sore throat. No chest pain. Patient has had some shortness of breath with activity, states that he has not felt like exercising due to the persistent cough. Patient was seen by his PCP earlier in the week and was prescribed Hydromet for cough. Symptoms have persisted. He does not have any chest pain or pleuritic chest pain. He states he has had a low-grade fever to 100F. No nausea, vomiting, or diarrhea. Patient does not have a history of congestive heart failure, COPD, blood clots. Patient did see some specks of blood in his sputum this morning. He came to the Ed for a chest x-ray to ensure he didn't have pneumonia. Patient denies risk factors for pulmonary embolism including: unilateral leg swelling, history of DVT/PE/other blood clots, use of hormones, recent immobilizations, recent surgery, recent travel (>4hr segment), malignancy. Patient does drive a lot however he takes frequent short trips. The onset of this condition was acute. The course is constant. Alleviating factors: none.    Patient is a 81 y.o. male presenting with cough. The history is provided by the patient, the spouse and medical records.  Cough Associated symptoms: fever, rhinorrhea, shortness of breath and sore throat   Associated symptoms: no chest pain, no headaches, no myalgias and no rash     Past Medical History  Diagnosis Date  . Hyperlipidemia     takes Pravastatin every other day  . Colon polyps   . Pneumonia     back in Feb 2014  . Arthritis     neck   . Calcification of coronary artery   . Hypertension   . Carotid artery occlusion    Past Surgical  History  Procedure Laterality Date  . Inguinal hernia repair    . Rotator cuff repair Bilateral 1998, 2001  . Pilonidal cyst drainage    . Tonsillectomy    . Colonoscopy    . Endarterectomy Right 11/10/2013    Procedure: ENDARTERECTOMY CAROTID;  Surgeon: Conrad [place], MD;  Location: [place];  Service: Vascular;  Laterality: Right;  . Patch angioplasty Right 11/10/2013    Procedure: PATCH ANGIOPLASTY;  Surgeon: Conrad [place], MD;  Location: [place];  Service: Vascular;  Laterality: Right;  . Nm myocar perf wall motion  04/2011    bruce myoview - perfusion defect in inferior myocardium (diaphragmatic attenuation), remaining myocardium with normal perfusion, EF 67%  . Cardiac catheterization  06/09/1999    mild CAD (LAD, diagonal, RCA) - Dr. Loni Muse. [place]  . Carotid doppler  02/2012    60-79% RICA stenosis, 02-58% LICA stenosis (prior to carotid endarterectomy)  . Carotid endarterectomy Right 11-10-13    cea   Family History  Problem Relation Age of Onset  . Prostate cancer Son   . Stroke Father   . Stroke Brother   . Cancer Brother     spine  . Hypertension Mother    History  Substance Use Topics  . Smoking status: Former Smoker    Types: Cigarettes    Quit date: 12/03/1972  . Smokeless tobacco: Never Used  . Alcohol Use: 4.2 oz/week    7 Glasses of wine per week     Comment: wine nightly  Review of Systems  Constitutional: Positive for fever.  HENT: Positive for congestion, rhinorrhea and sore throat.   Eyes: Negative for redness.  Respiratory: Positive for cough and shortness of breath.   Cardiovascular: Negative for chest pain.  Gastrointestinal: Negative for nausea, vomiting, abdominal pain and diarrhea.  Genitourinary: Negative for dysuria.  Musculoskeletal: Negative for myalgias.  Skin: Negative for rash.  Neurological: Negative for headaches.      Allergies  Lipitor and Vantin  Home Medications   Prior to Admission medications   Medication Sig Start Date End Date  Taking? Authorizing Provider  ALPHA LIPOIC ACID PO Take 300 mg by mouth daily.    Historical Provider, MD  Cholecalciferol (VITAMIN D3) 2000 UNITS capsule Take 2,000 Units by mouth daily. Take 2,000 Units by mouth daily.    Historical Provider, MD  Coenzyme Q10-Levocarnitine 100-20 MG CAPS Take 1 capsule by mouth daily. Take by mouth.    Historical Provider, MD  Glucosamine-Chondroitin 500-400 MG CAPS Take 1 capsule by mouth daily. Take by mouth.    Historical Provider, MD  HYDROcodone-homatropine Judithe Modest) 5-1.5 MG/5ML syrup 1/2-1 teaspoon at bedtime when necessary 12/13/14   Dorena Cookey, MD  Magnesium 500 MG TABS Take 1 tablet by mouth daily.    Historical Provider, MD  Multiple Vitamin (MULTIVITAMIN) tablet Take 0.5 tablets by mouth daily.     Historical Provider, MD  OMEGA-3 1000 MG CAPS Take 4,000 mg by mouth daily.    Historical Provider, MD  pravastatin (PRAVACHOL) 40 MG tablet TAKE 1TABLET BY MOUTH AT BEDTIME. Patient taking differently: Take 20 mg by mouth daily. TAKE 1TABLET BY MOUTH AT BEDTIME. 10/04/14   Doe-Hyun R Shawna Orleans, DO  Probiotic Product (PROBIOTIC DAILY PO) Take 1 capsule by mouth daily.    Historical Provider, MD  triamcinolone cream (KENALOG) 0.1 % Apply 1 application topically daily as needed (For rash).  04/21/14   Historical Provider, MD   BP 139/58 mmHg  Pulse 65  Temp(Src) 97.9 F (36.6 C) (Oral)  Resp 18  SpO2 99% Physical Exam  Constitutional: He appears well-developed and well-nourished.  HENT:  Head: Normocephalic and atraumatic.  Mouth/Throat: Mucous membranes are normal. Mucous membranes are not dry.  Eyes: Conjunctivae are normal. Right eye exhibits no discharge. Left eye exhibits no discharge.  Neck: Trachea normal and normal range of motion. Neck supple. Normal carotid pulses and no JVD present. No muscular tenderness present. Carotid bruit is not present. No tracheal deviation present.  Cardiovascular: Normal rate, regular rhythm, S1 normal, S2 normal,  normal heart sounds and intact distal pulses.  Exam reveals no distant heart sounds and no decreased pulses.   No murmur heard. Pulmonary/Chest: Effort normal and breath sounds normal. No respiratory distress. He has no wheezes. He exhibits no tenderness.  Abdominal: Soft. Normal aorta and bowel sounds are normal. There is no tenderness. There is no rebound and no guarding.  Musculoskeletal: He exhibits no edema.  Neurological: He is alert.  Skin: Skin is warm and dry. He is not diaphoretic. No cyanosis. No pallor.  Psychiatric: He has a normal mood and affect.  Nursing note and vitals reviewed.   ED Course  Procedures (including critical care time) Labs Review Labs Reviewed - No data to display  Imaging Review Dg Chest 2 View  12/17/2014   CLINICAL DATA:  Productive cough for 1 week. History of pneumonia 2 years ago. Initial encounter.  EXAM: CHEST  2 VIEW  COMPARISON:  10/10/2014  FINDINGS: Right rotator cuff repair. Midline trachea.  Normal heart size and mediastinal contours for age. No pleural effusion or pneumothorax. Biapical pleural thickening. Mild bibasilar scarring.  IMPRESSION: No acute cardiopulmonary disease.   Electronically Signed   By: Abigail Miyamoto M.D.   On: 12/17/2014 13:39     EKG Interpretation None       4:11 PM Patient seen and examined in hallway bed Work-up initiated. Medications ordered.   Vital signs reviewed and are as follows: BP 139/58 mmHg  Pulse 65  Temp(Src) 97.9 F (36.6 C) (Oral)  Resp 18  SpO2 99%  4:33 PM patient seen by and discussed with Dr. Mingo Amber. Will discharge to home with albuterol and prescription for Levaquin. Patient counseled to fill Levaquin in 48-72 hours if not feeling better, sooner if worsening shortness of breath. Patient is encouraged to follow-up with his PCP in 3 days for recheck.  Patient urged to return with worsening symptoms, chest pain, worsening shortness of breath, high fever or other concerns. Patient verbalized  understanding and agrees with plan.    MDM   Final diagnoses:  Acute bronchitis, unspecified organism   Patient with constellation of symptoms consistent with viral upper respiratory tract infection/bronchitis. Patient has had a mild amount of hemoptysis today only. There is nothing in patient's history, vital signs which is overly concerning for PE. Do not suspect CHF or COPD exacerbation. No chest pain. No pleuritic pain. Will treat conservatively as above. Patient appears well, nontoxic. Appropriate return instructions discussed with patient at bedside. He is mainly concerned today that he has a pneumonia and is reassured by negative x-ray.  No dangerous or life-threatening conditions suspected or identified by history, physical exam, and by work-up. No indications for hospitalization identified.     Carlisle Cater, PA-C 12/17/14 1636  Evelina Bucy, MD 12/18/14 [phone]

## 2014-12-17 NOTE — ED Notes (Signed)
Pt reports having a cough and cold symptoms x 1 week. Hx of pneumonia. Reports cough is productive with yellow/green sputum. Also has low grade fever. No acute distress noted at triage.

## 2014-12-20 NOTE — Telephone Encounter (Signed)
Pt went to ED on 1.15.2016

## 2015-01-26 ENCOUNTER — Other Ambulatory Visit (INDEPENDENT_AMBULATORY_CARE_PROVIDER_SITE_OTHER): Payer: Medicare Other

## 2015-01-26 DIAGNOSIS — E785 Hyperlipidemia, unspecified: Secondary | ICD-10-CM

## 2015-01-26 LAB — CK: Total CK: 39 U/L (ref 7–232)

## 2015-01-27 LAB — HOMOCYSTEINE: Homocysteine: 13.9 umol/L (ref 4.0–15.4)

## 2015-02-04 ENCOUNTER — Encounter: Payer: Self-pay | Admitting: Internal Medicine

## 2015-02-04 ENCOUNTER — Ambulatory Visit (INDEPENDENT_AMBULATORY_CARE_PROVIDER_SITE_OTHER): Payer: Medicare Other | Admitting: Internal Medicine

## 2015-02-04 VITALS — BP 120/78 | HR 64 | Temp 98.4°F | Wt 194.0 lb

## 2015-02-04 DIAGNOSIS — R042 Hemoptysis: Secondary | ICD-10-CM

## 2015-02-04 DIAGNOSIS — J189 Pneumonia, unspecified organism: Secondary | ICD-10-CM

## 2015-02-04 DIAGNOSIS — I10 Essential (primary) hypertension: Secondary | ICD-10-CM

## 2015-02-04 DIAGNOSIS — Z23 Encounter for immunization: Secondary | ICD-10-CM

## 2015-02-04 NOTE — Assessment & Plan Note (Signed)
Well controlled.  BP: 120/78 mmHg

## 2015-02-04 NOTE — Patient Instructions (Addendum)
Our office will contact you re: setting up CT of chest Please schedule next office visit as medicare wellness exam

## 2015-02-04 NOTE — Progress Notes (Signed)
Pre visit review using our clinic review tool, if applicable. No additional management support is needed unless otherwise documented below in the visit note. 

## 2015-02-04 NOTE — Progress Notes (Signed)
Subjective:    Patient ID: Sean Miranda, male    DOB: June 10, 1930, 81 y.o.   MRN: [id number]  HPI  81 year old white male with history of hyperlipidemia and remote history of tobacco use for follow-up. Interval medical history-patient was seen in emergency room symptoms of bronchitis. He had associated blood-tinged sputum. Patient is former smoker. He smoked half pack per day for 20 years. He quit in 1974.  Patient denies chronic cough.  He reports he is followed at [facility] for remote history of chest sarcoma.  Review of Systems Intermittent dyspnea with climbing up hills, no chest pain    Past Medical History  Diagnosis Date  . Hyperlipidemia     takes Pravastatin every other day  . Colon polyps   . Pneumonia     back in Feb 2014  . Arthritis     neck   . Calcification of coronary artery   . Hypertension   . Carotid artery occlusion     History   Social History  . Marital Status: Married    Spouse Name: N/A  . Number of Children: 3  . Years of Education: N/A   Occupational History  .     Social History Main Topics  . Smoking status: Former Smoker    Types: Cigarettes    Quit date: 12/03/1972  . Smokeless tobacco: Never Used  . Alcohol Use: 4.2 oz/week    7 Glasses of wine per week     Comment: wine nightly  . Drug Use: No  . Sexual Activity: Not on file   Other Topics Concern  . Not on file   Social History Narrative    Past Surgical History  Procedure Laterality Date  . Inguinal hernia repair    . Rotator cuff repair Bilateral 1998, 2001  . Pilonidal cyst drainage    . Tonsillectomy    . Colonoscopy    . Endarterectomy Right 11/10/2013    Procedure: ENDARTERECTOMY CAROTID;  Surgeon: Conrad Meadow Grove, MD;  Location: [place];  Service: Vascular;  Laterality: Right;  . Patch angioplasty Right 11/10/2013    Procedure: PATCH ANGIOPLASTY;  Surgeon: Conrad Belgreen, MD;  Location: [place];  Service: Vascular;  Laterality: Right;  . Nm myocar perf wall motion  04/2011     bruce myoview - perfusion defect in inferior myocardium (diaphragmatic attenuation), remaining myocardium with normal perfusion, EF 67%  . Cardiac catheterization  06/09/1999    mild CAD (LAD, diagonal, RCA) - Dr. Loni Muse. [place]  . Carotid doppler  02/2012    60-79% RICA stenosis, 25-42% LICA stenosis (prior to carotid endarterectomy)  . Carotid endarterectomy Right 11-10-13    cea    Family History  Problem Relation Age of Onset  . Prostate cancer Son   . Stroke Father   . Stroke Brother   . Cancer Brother     spine  . Hypertension Mother     Allergies  Allergen Reactions  . Lipitor [Atorvastatin Calcium] Other (See Comments)    Muscle pain  . Vantin [Cefpodoxime] Rash    Current Outpatient Prescriptions on File Prior to Visit  Medication Sig Dispense Refill  . ALPHA LIPOIC ACID PO Take 300 mg by mouth daily.    . Cholecalciferol (VITAMIN D3) 2000 UNITS capsule Take 2,000 Units by mouth daily. Take 2,000 Units by mouth daily.    . Coenzyme Q10-Levocarnitine 100-20 MG CAPS Take 1 capsule by mouth daily. Take by mouth.    . Glucosamine-Chondroitin 500-400 MG  CAPS Take 1 capsule by mouth daily. Take by mouth.    . Magnesium 500 MG TABS Take 1 tablet by mouth daily.    . Multiple Vitamin (MULTIVITAMIN) tablet Take 0.5 tablets by mouth daily.     . OMEGA-3 1000 MG CAPS Take 4,000 mg by mouth daily.    . pravastatin (PRAVACHOL) 40 MG tablet TAKE 1TABLET BY MOUTH AT BEDTIME. (Patient taking differently: Take 20 mg by mouth daily. TAKE 1TABLET BY MOUTH AT BEDTIME.) 90 tablet 3  . Probiotic Product (PROBIOTIC DAILY PO) Take 1 capsule by mouth daily.    Marland Kitchen triamcinolone cream (KENALOG) 0.1 % Apply 1 application topically daily as needed (For rash).      No current facility-administered medications on file prior to visit.    BP 120/78 mmHg  Pulse 64  Temp(Src) 98.4 F (36.9 C) (Oral)  Wt 194 lb (87.998 kg)    Objective:   Physical Exam  Constitutional: He is oriented to person,  place, and time. He appears well-developed and well-nourished. No distress.  HENT:  Head: Normocephalic and atraumatic.  Right Ear: External ear normal.  Left Ear: External ear normal.  Mouth/Throat: Oropharynx is clear and moist.  Normal nasal turbinates. No sign of bleeding  Neck: Neck supple.  Cardiovascular: Normal rate, regular rhythm and normal heart sounds.   No murmur heard. Pulmonary/Chest: Effort normal and breath sounds normal. He has no wheezes.  Musculoskeletal: He exhibits no edema.  Lymphadenopathy:    He has no cervical adenopathy.  Neurological: He is alert and oriented to person, place, and time. No cranial nerve deficit.  Skin: Skin is warm and dry.  Psychiatric: He has a normal mood and affect. His behavior is normal.          Assessment & Plan:

## 2015-02-04 NOTE — Assessment & Plan Note (Signed)
81 year old white male had bout of bronchitis in January 2016. He had associated hemoptysis. He has remote history of tobacco use. Obtain CT of chest to rule out malignancy.

## 2015-02-07 ENCOUNTER — Other Ambulatory Visit (INDEPENDENT_AMBULATORY_CARE_PROVIDER_SITE_OTHER): Payer: Medicare Other

## 2015-02-07 DIAGNOSIS — I10 Essential (primary) hypertension: Secondary | ICD-10-CM

## 2015-02-07 LAB — BASIC METABOLIC PANEL
BUN: 17 mg/dL (ref 6–23)
CHLORIDE: 101 meq/L (ref 96–112)
CO2: 31 mEq/L (ref 19–32)
CREATININE: 1.06 mg/dL (ref 0.40–1.50)
Calcium: 9.2 mg/dL (ref 8.4–10.5)
GFR: 70.89 mL/min (ref >60.00)
Glucose, Bld: 95 mg/dL (ref 70–99)
POTASSIUM: 4 meq/L (ref 3.5–5.1)
Sodium: 136 mEq/L (ref 135–145)

## 2015-02-09 ENCOUNTER — Ambulatory Visit (INDEPENDENT_AMBULATORY_CARE_PROVIDER_SITE_OTHER)
Admission: RE | Admit: 2015-02-09 | Discharge: 2015-02-09 | Disposition: A | Discharge status: Home / Self Care | Payer: Medicare Other | Source: Ambulatory Visit | Attending: Internal Medicine | Admitting: Internal Medicine

## 2015-02-09 DIAGNOSIS — R042 Hemoptysis: Secondary | ICD-10-CM

## 2015-02-09 DIAGNOSIS — J189 Pneumonia, unspecified organism: Secondary | ICD-10-CM

## 2015-02-09 MED ORDER — IOHEXOL 300 MG/ML  SOLN
80.0000 mL | Freq: Once | INTRAMUSCULAR | Status: AC | PRN
  Administered 2015-02-09: 80 mL via INTRAVENOUS

## 2015-02-16 ENCOUNTER — Telehealth: Payer: Self-pay | Admitting: Internal Medicine

## 2015-02-16 DIAGNOSIS — R911 Solitary pulmonary nodule: Secondary | ICD-10-CM

## 2015-02-16 NOTE — Telephone Encounter (Signed)
Pt had ct scan 3/9 and would like results

## 2015-02-17 NOTE — Telephone Encounter (Signed)
See result note.  

## 2015-06-22 ENCOUNTER — Encounter: Payer: Self-pay | Admitting: Cardiology

## 2015-08-09 ENCOUNTER — Ambulatory Visit (INDEPENDENT_AMBULATORY_CARE_PROVIDER_SITE_OTHER)
Admission: RE | Admit: 2015-08-09 | Discharge: 2015-08-09 | Disposition: A | Discharge status: Home / Self Care | Payer: Medicare Other | Source: Ambulatory Visit | Attending: Internal Medicine | Admitting: Internal Medicine

## 2015-08-09 DIAGNOSIS — R911 Solitary pulmonary nodule: Secondary | ICD-10-CM | POA: Diagnosis not present

## 2015-10-12 ENCOUNTER — Other Ambulatory Visit (INDEPENDENT_AMBULATORY_CARE_PROVIDER_SITE_OTHER): Payer: Medicare Other

## 2015-10-12 DIAGNOSIS — E785 Hyperlipidemia, unspecified: Secondary | ICD-10-CM | POA: Diagnosis not present

## 2015-10-12 DIAGNOSIS — Z0000 Encounter for general adult medical examination without abnormal findings: Secondary | ICD-10-CM | POA: Diagnosis not present

## 2015-10-12 LAB — CBC WITH DIFFERENTIAL/PLATELET
Basophils Absolute: 0 10*3/uL (ref 0.0–0.1)
Basophils Relative: 0.3 % (ref 0.0–3.0)
Eosinophils Absolute: 0.2 10*3/uL (ref 0.0–0.7)
Eosinophils Relative: 2.5 % (ref 0.0–5.0)
HCT: 41.3 % (ref 39.0–52.0)
HEMOGLOBIN: 13.7 g/dL (ref 13.0–17.0)
LYMPHS ABS: 1.7 10*3/uL (ref 0.7–4.0)
Lymphocytes Relative: 25.9 % (ref 12.0–46.0)
MCHC: 33.2 g/dL (ref 30.0–36.0)
MCV: 94.8 fl (ref 78.0–100.0)
Monocytes Absolute: 0.6 10*3/uL (ref 0.1–1.0)
Monocytes Relative: 9.4 % (ref 3.0–12.0)
NEUTROS ABS: 4.1 10*3/uL (ref 1.4–7.7)
Neutrophils Relative %: 61.9 % (ref 43.0–77.0)
Platelets: 271 10*3/uL (ref 150.0–400.0)
RBC: 4.36 Mil/uL (ref 4.22–5.81)
RDW: 14 % (ref 11.5–15.5)
WBC: 6.7 10*3/uL (ref 4.0–10.5)

## 2015-10-12 LAB — LIPID PANEL
CHOL/HDL RATIO: 5
Cholesterol: 223 mg/dL — ABNORMAL HIGH (ref 0–200)
HDL: 44 mg/dL (ref >39.00)
LDL Cholesterol: 166 mg/dL — ABNORMAL HIGH (ref 0–99)
NonHDL: 179.2
Triglycerides: 68 mg/dL (ref 0.0–149.0)
VLDL: 13.6 mg/dL (ref 0.0–40.0)

## 2015-10-12 LAB — BASIC METABOLIC PANEL
BUN: 18 mg/dL (ref 6–23)
CALCIUM: 9.6 mg/dL (ref 8.4–10.5)
CO2: 28 meq/L (ref 19–32)
CREATININE: 1.07 mg/dL (ref 0.40–1.50)
Chloride: 104 mEq/L (ref 96–112)
GFR: 70.01 mL/min (ref >60.00)
Glucose, Bld: 101 mg/dL — ABNORMAL HIGH (ref 70–99)
Potassium: 5.6 mEq/L — ABNORMAL HIGH (ref 3.5–5.1)
Sodium: 139 mEq/L (ref 135–145)

## 2015-10-12 LAB — HEPATIC FUNCTION PANEL
ALBUMIN: 4.2 g/dL (ref 3.5–5.2)
ALT: 17 U/L (ref 0–53)
AST: 18 U/L (ref 0–37)
Alkaline Phosphatase: 57 U/L (ref 39–117)
Bilirubin, Direct: 0.1 mg/dL (ref 0.0–0.3)
Total Bilirubin: 0.6 mg/dL (ref 0.2–1.2)
Total Protein: 6.8 g/dL (ref 6.0–8.3)

## 2015-10-12 LAB — TSH: TSH: 1.25 u[IU]/mL (ref 0.35–4.50)

## 2015-10-12 LAB — PSA: PSA: 0.33 ng/mL (ref 0.10–4.00)

## 2015-10-21 ENCOUNTER — Encounter: Payer: Medicare Other | Admitting: Internal Medicine

## 2015-10-31 ENCOUNTER — Encounter: Payer: Self-pay | Admitting: Family Medicine

## 2015-10-31 ENCOUNTER — Ambulatory Visit (INDEPENDENT_AMBULATORY_CARE_PROVIDER_SITE_OTHER): Payer: Medicare Other | Admitting: Family Medicine

## 2015-10-31 VITALS — BP 120/78 | HR 58 | Temp 98.3°F | Resp 16 | Ht 71.0 in | Wt 190.7 lb

## 2015-10-31 DIAGNOSIS — C493 Malignant neoplasm of connective and soft tissue of thorax: Secondary | ICD-10-CM | POA: Diagnosis not present

## 2015-10-31 DIAGNOSIS — Z0000 Encounter for general adult medical examination without abnormal findings: Secondary | ICD-10-CM | POA: Diagnosis not present

## 2015-10-31 NOTE — Patient Instructions (Signed)
Health Maintenance  Topic Date Due  . INFLUENZA VACCINE  10/30/2016 (Originally 07/04/2015)  . TETANUS/TDAP  11/01/2019  . ZOSTAVAX  Addressed  . PNA vac Low Risk Adult  Completed    Let us know if you change your mind regarding flu vaccine.

## 2015-10-31 NOTE — Progress Notes (Signed)
Subjective:    Patient ID: Sean Miranda, male    DOB: [birth year], 81 y.o.   MRN: [id number]  HPI Patient seen for complete physical in absence of his usual primary.  He has history of CAD, peripheral vascular disease, hypertension. 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 He brings in copy of CT chest which showed some nonspecific inflammatory changes. He is followed closely at [facility] regarding his sarcoma history.  Previous carotid endarterectomy. Followed closely by vascular surgery. He has not had any history of TIA or stroke. Treated in the past with pravastatin but has not taken this consistently for the past 3-4 months. No other regular prescription medications. Nonsmoker. 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  Past Medical History  Diagnosis Date  . Hyperlipidemia     takes Pravastatin every other day  . Colon polyps   . Pneumonia     back in Feb 2014  . Arthritis     neck   . Calcification of coronary artery   . Hypertension   . Carotid artery occlusion    Past Surgical History  Procedure Laterality Date  . Inguinal hernia repair    . Rotator cuff repair Bilateral 1998, 2001  . Pilonidal cyst drainage    . Tonsillectomy    . Colonoscopy    . Endarterectomy Right 11/10/2013    Procedure: ENDARTERECTOMY CAROTID;  Surgeon: Conrad Calamus, MD;  Location: [place];  Service: Vascular;  Laterality: Right;  . Patch angioplasty Right 11/10/2013    Procedure: PATCH ANGIOPLASTY;  Surgeon: Conrad Springdale, MD;  Location: [place];  Service: Vascular;  Laterality: Right;  . Nm myocar perf wall motion  04/2011    A[facility] Emigh myoview - perfusion defect in inferior myocardium (diaphragmatic attenuation), remaining myocardium with normal perfusion, EF 67%  . Cardiac catheterization  06/09/1999    mild CAD (LAD, diagonal, RCA) - Dr. Loni Muse. [place]  . Carotid doppler   02/2012    60-79% RICA stenosis, 123456 LICA stenosis (prior to carotid endarterectomy)  . Carotid endarterectomy Right 11-10-13    cea    reports that he quit smoking about 42 years ago. His smoking use included Cigarettes. He has never used smokeless tobacco. He reports that he drinks about 4.2 oz of alcohol per week. He reports that he does not use illicit drugs. family history includes Cancer in his brother; Hypertension in his mother; Prostate cancer in his son; Stroke in his brother and father. Allergies  Allergen Reactions  . Lipitor [Atorvastatin Calcium] Other (See Comments)    Muscle pain  . Vantin [Cefpodoxime] Rash      Review of Systems  Constitutional: Negative for fever, activity change, appetite change and fatigue.  HENT: Negative for congestion, ear pain and trouble swallowing.   Eyes: Negative for pain and visual disturbance.  Respiratory: Negative for cough, shortness of breath and wheezing.   Cardiovascular: Negative for chest pain and palpitations.  Gastrointestinal: Negative for nausea, vomiting, abdominal pain, diarrhea, constipation, blood in stool, abdominal distention and rectal pain.  Genitourinary: Negative for dysuria, hematuria and testicular pain.  Musculoskeletal: Negative for joint swelling and arthralgias.  Skin: Negative for rash.  Neurological: Negative for dizziness, syncope and headaches.  Hematological: Negative for adenopathy.  Psychiatric/Behavioral: Negative for confusion and dysphoric mood.       Objective:   Physical Exam  Constitutional: He is  oriented to person, place, and time. He appears well-developed and well-nourished. No distress.  HENT:  Head: Normocephalic and atraumatic.  Right Ear: External ear normal.  Left Ear: External ear normal.  Mouth/Throat: Oropharynx is clear and moist.  Eyes: Conjunctivae and EOM are normal. Pupils are equal, round, and reactive to light.  Neck: Normal range of motion. Neck supple. No  thyromegaly present.  Cardiovascular: Normal rate, regular rhythm and normal heart sounds.   No murmur heard. Pulmonary/Chest: No respiratory distress. He has no wheezes. He has no rales.  Abdominal: Soft. Bowel sounds are normal. He exhibits no distension and no mass. There is no tenderness. There is no rebound and no guarding.  Musculoskeletal: He exhibits no edema.  Lymphadenopathy:    He has no cervical adenopathy.  Neurological: He is alert and oriented to person, place, and time. He displays normal reflexes. No cranial nerve deficit.  Skin: No rash noted.  Psychiatric: He has a normal mood and affect.          Assessment & Plan:  Physical exam. Labs reviewed. Potassium 5.6. He is not taking a supplement and suspect this was hemolyzed. Flu vaccine recommended and he declines.  Other immunizations up-to-date. Discussed regular exercise. Get back on regular use of pravastatin- especially in view of his history of peripheral vascular disease

## 2015-12-16 ENCOUNTER — Ambulatory Visit: Payer: Medicare Other | Admitting: Family

## 2015-12-16 ENCOUNTER — Other Ambulatory Visit (HOSPITAL_COMMUNITY): Payer: Medicare Other

## 2015-12-26 ENCOUNTER — Encounter: Payer: Self-pay | Admitting: Family

## 2016-01-04 ENCOUNTER — Ambulatory Visit (HOSPITAL_COMMUNITY)
Admission: RE | Admit: 2016-01-04 | Discharge: 2016-01-04 | Disposition: A | Discharge status: Home / Self Care | Payer: Medicare Other | Source: Ambulatory Visit | Attending: Family | Admitting: Family

## 2016-01-04 ENCOUNTER — Ambulatory Visit (INDEPENDENT_AMBULATORY_CARE_PROVIDER_SITE_OTHER): Payer: Medicare Other | Admitting: Family

## 2016-01-04 ENCOUNTER — Encounter: Payer: Self-pay | Admitting: Family

## 2016-01-04 VITALS — BP 128/68 | HR 56 | Ht 71.0 in | Wt 193.2 lb

## 2016-01-04 DIAGNOSIS — I779 Disorder of arteries and arterioles, unspecified: Secondary | ICD-10-CM | POA: Insufficient documentation

## 2016-01-04 DIAGNOSIS — I10 Essential (primary) hypertension: Secondary | ICD-10-CM | POA: Insufficient documentation

## 2016-01-04 DIAGNOSIS — E785 Hyperlipidemia, unspecified: Secondary | ICD-10-CM | POA: Diagnosis not present

## 2016-01-04 DIAGNOSIS — Z48812 Encounter for surgical aftercare following surgery on the circulatory system: Secondary | ICD-10-CM | POA: Diagnosis not present

## 2016-01-04 DIAGNOSIS — Z9889 Other specified postprocedural states: Secondary | ICD-10-CM | POA: Diagnosis not present

## 2016-01-04 DIAGNOSIS — I6523 Occlusion and stenosis of bilateral carotid arteries: Secondary | ICD-10-CM | POA: Diagnosis not present

## 2016-01-04 NOTE — Addendum Note (Signed)
Addended by: Reola Calkins on: 01/04/2016 03:38 PM   Modules accepted: Orders

## 2016-01-04 NOTE — Progress Notes (Signed)
Chief Complaint: Extracranial Carotid Artery Stenosis   History of Present Illness  Sean Miranda is a 82 y.o. male patient of Dr. Bridgett Larsson who is s/p R CEA (Date: 11/10/13). He returns today for surveillance and follow up.  The patient denies any history of TIA or stroke symptoms.Specifically he denies a history of amaurosis fugax or monocular blindness, unilateral facial drooping, hemiparesis, or receptive or expressive aphasia.   He denies tingling, numbness, pain, or weakness in either UE, denies dizziness. He denies claudication symptoms with walking, denies no healing wounds.  He reports a strong family hx of strokes.  Pt denies New Medical or Surgical History. He exercises 45 minutes at least 3-4 days/week. He had a stress test in 2015, Dr. Debara Pickett. He denies any history of MI or any cardiac problems.  Pt Diabetic: No Pt smoker: former smoker, quit in 1974  Pt meds include: Statin : Yes, qod ASA: Yes, 81 mg qod Other anticoagulants/antiplatelets: no    Past Medical History  Diagnosis Date  . Hyperlipidemia     takes Pravastatin every other day  . Colon polyps   . Pneumonia     back in Feb 2014  . Arthritis     neck   . Calcification of coronary artery   . Hypertension   . Carotid artery occlusion     Social History Social History  Substance Use Topics  . Smoking status: Former Smoker    Types: Cigarettes    Quit date: 12/03/1972  . Smokeless tobacco: Never Used  . Alcohol Use: 4.2 oz/week    7 Glasses of wine per week     Comment: wine nightly    Family History Family History  Problem Relation Age of Onset  . Prostate cancer Son   . Stroke Father   . Stroke Brother   . Cancer Brother     spine  . Hypertension Mother     Surgical History Past Surgical History  Procedure Laterality Date  . Inguinal hernia repair    . Rotator cuff repair Bilateral 1998, 2001  . Pilonidal cyst drainage    . Tonsillectomy    . Colonoscopy    .  Endarterectomy Right 11/10/2013    Procedure: ENDARTERECTOMY CAROTID;  Surgeon: Conrad Conover, MD;  Location: [place];  Service: Vascular;  Laterality: Right;  . Patch angioplasty Right 11/10/2013    Procedure: PATCH ANGIOPLASTY;  Surgeon: Conrad Pearl Beach, MD;  Location: [place];  Service: Vascular;  Laterality: Right;  . Nm myocar perf wall motion  04/2011    bruce myoview - perfusion defect in inferior myocardium (diaphragmatic attenuation), remaining myocardium with normal perfusion, EF 67%  . Cardiac catheterization  06/09/1999    mild CAD (LAD, diagonal, RCA) - Dr. Loni Muse. [place]  . Carotid doppler  02/2012    60-79% RICA stenosis, 123456 LICA stenosis (prior to carotid endarterectomy)  . Carotid endarterectomy Right 11-10-13    cea    Allergies  Allergen Reactions  . Lipitor [Atorvastatin Calcium] Other (See Comments)    Muscle pain  . Vantin [Cefpodoxime] Rash    Current Outpatient Prescriptions  Medication Sig Dispense Refill  . ALPHA LIPOIC ACID PO Take 300 mg by mouth daily.    . Cholecalciferol (VITAMIN D3) 2000 UNITS capsule Take 2,000 Units by mouth daily. Take 2,000 Units by mouth daily.    . Coenzyme Q10-Levocarnitine 100-20 MG CAPS Take 1 capsule by mouth daily. Take by mouth.    . Glucosamine-Chondroitin 500-400 MG CAPS  Take 1 capsule by mouth daily. Take by mouth.    . Magnesium 500 MG TABS Take 1 tablet by mouth daily.    . Multiple Vitamin (MULTIVITAMIN) tablet Take 0.5 tablets by mouth daily.     . OMEGA-3 1000 MG CAPS Take 4,000 mg by mouth daily.    . pravastatin (PRAVACHOL) 40 MG tablet TAKE 1TABLET BY MOUTH AT BEDTIME. (Patient taking differently: Take 20 mg by mouth every other day. TAKE 1TABLET BY MOUTH AT BEDTIME.) 90 tablet 3  . Probiotic Product (PROBIOTIC DAILY PO) Take 1 capsule by mouth daily.    Marland Kitchen triamcinolone cream (KENALOG) 0.1 % Apply 1 application topically daily as needed (For rash).      No current facility-administered medications for this visit.    Review  of Systems : See HPI for pertinent positives and negatives.  Physical Examination  Filed Vitals:   01/04/16 1409 01/04/16 1412  BP: 124/64 128/68  Pulse: 56   Height: 5\' 11"  (1.803 m)   Weight: 193 lb 3.2 oz (87.635 kg)   SpO2: 95%    Body mass index is 26.96 kg/(m^2).  General: A&O x 3, WD, fit appearing, appears younger than stated age.  Eyes: PERRLA  Pulmonary: Sym exp, good air movt, CTAB, no rales, rhonchi, or wheezing  Cardiac: RRR, Nl S1, S2, no detected murmur  Vascular:  Vessel  Right  Left   Radial  2+ Palpable  2+Palpable   Carotid  Palpable, without bruit  Palpable, without bruit   Popliteal  Not palpable  Not palpable   PT  2+Palpable  2+ Palpable   DP  2+Palpable  Not Palpable    Gastrointestinal: soft, NTND, -G/R, - HSM, - palpable masses, - CVAT B Musculoskeletal: M/S 5/5 throughout , Extremities without ischemic changes  Neurologic: CN 2-12 intact , Pain and light touch intact in extremities , Motor exam as listed above  Psychiatric: Judgment intact, Mood & affect appropriate for pt's clinical situation  Dermatologic: See M/S exam for extremity exam, no rashes otherwise noted           Non-Invasive Vascular Imaging CAROTID DUPLEX 01/04/2016   CEREBROVASCULAR DUPLEX EVALUATION    INDICATION: Carotid artery disease     PREVIOUS INTERVENTION(S): Right carotid endarterectomy 11/10/2013    DUPLEX EXAM:     RIGHT  LEFT  Peak Systolic Velocities (cm/s) End Diastolic Velocities (cm/s) Plaque LOCATION Peak Systolic Velocities (cm/s) End Diastolic Velocities (cm/s) Plaque  83 15  CCA PROXIMAL 116 21   106 18  CCA MID 117 18   108 17  CCA DISTAL 110 17   182 11  ECA 135 9   102 17  ICA PROXIMAL 84 17   100 24 HM ICA MID 101 21   100 26  ICA DISTAL 79 17     NA ICA / CCA Ratio (PSV) 0.86  Antegrade  Vertebral Flow Antegrade    Brachial Systolic Pressure (mmHg)   Multiphasic (Subclavian artery) Brachial Artery  Waveforms Multiphasic (Subclavian artery)    Plaque Morphology:  HM = Homogeneous, HT = Heterogeneous, CP = Calcific Plaque, SP = Smooth Plaque, IP = Irregular Plaque     ADDITIONAL FINDINGS: Elevated velocities noted in the innominate artery.    IMPRESSION: Patent right carotid endarterectomy site with evidence of mild hyperplasia; elevated velocities at the distal patch may be due to a change in vessel diameter and hyperplasia. Left internal carotid artery velocities suggest a <40% stenosis.      Compared  to the previous exam:  No significant change in comparison to the last exam on 12/10/2014; velocities on the right side are less than previously recorded.      Assessment: SCHON ETHEREDGE is a 82 y.o. male who is s/p R CEA (Date: 11/10/13). He has no hx of stroke or TIA.  Today's carotid duplex suggests right carotid endarterectomy site with evidence of mild hyperplasia; elevated velocities at the distal patch may be due to a change in vessel diameter and hyperplasia. Left internal carotid artery velocities suggest a <40% stenosis. No significant change in comparison to the last exam on 12/10/2014; velocities on the right side are less than previously recorded.    Plan: Follow-up in 1 year with Carotid Duplex scan.   I discussed in depth with the patient the nature of atherosclerosis, and emphasized the importance of maximal medical management including strict control of blood pressure, blood glucose, and lipid levels, obtaining regular exercise, and continued cessation of smoking.  The patient is aware that without maximal medical management the underlying atherosclerotic disease process will progress, limiting the benefit of any interventions. The patient was given information about stroke prevention and what symptoms should prompt the patient to seek immediate medical care. Thank you for allowing [place] to participate in this patient's care.  Clemon Chambers, RN, MSN, FNP-C [facility] Office: [phone]  Clinic Physician: Scot Dock  01/04/2016 2:17 PM

## 2016-01-04 NOTE — Patient Instructions (Signed)
Stroke Prevention Some medical conditions and behaviors are associated with an increased chance of having a stroke. You may prevent a stroke by making healthy choices and managing medical conditions. HOW CAN I REDUCE MY RISK OF HAVING A STROKE?   Stay physically active. Get at least 30 minutes of activity on most or all days.  Do not smoke. It may also be helpful to avoid exposure to secondhand smoke.  Limit alcohol use. Moderate alcohol use is considered to be:  No more than 2 drinks per day for men.  No more than 1 drink per day for nonpregnant women.  Eat healthy foods. This involves:  Eating 5 or more servings of fruits and vegetables a day.  Making dietary changes that address high blood pressure (hypertension), high cholesterol, diabetes, or obesity.  Manage your cholesterol levels.  Making food choices that are high in fiber and low in saturated fat, trans fat, and cholesterol may control cholesterol levels.  Take any prescribed medicines to control cholesterol as directed by your health care provider.  Manage your diabetes.  Controlling your carbohydrate and sugar intake is recommended to manage diabetes.  Take any prescribed medicines to control diabetes as directed by your health care provider.  Control your hypertension.  Making food choices that are low in salt (sodium), saturated fat, trans fat, and cholesterol is recommended to manage hypertension.  Ask your health care provider if you need treatment to lower your blood pressure. Take any prescribed medicines to control hypertension as directed by your health care provider.  If you are 18-39 years of age, have your blood pressure checked every 3-5 years. If you are 40 years of age or older, have your blood pressure checked every year.  Maintain a healthy weight.  Reducing calorie intake and making food choices that are low in sodium, saturated fat, trans fat, and cholesterol are recommended to manage  weight.  Stop drug abuse.  Avoid taking birth control pills.  Talk to your health care provider about the risks of taking birth control pills if you are over 35 years old, smoke, get migraines, or have ever had a blood clot.  Get evaluated for sleep disorders (sleep apnea).  Talk to your health care provider about getting a sleep evaluation if you snore a lot or have excessive sleepiness.  Take medicines only as directed by your health care provider.  For some people, aspirin or blood thinners (anticoagulants) are helpful in reducing the risk of forming abnormal blood clots that can lead to stroke. If you have the irregular heart rhythm of atrial fibrillation, you should be on a blood thinner unless there is a good reason you cannot take them.  Understand all your medicine instructions.  Make sure that other conditions (such as anemia or atherosclerosis) are addressed. SEEK IMMEDIATE MEDICAL CARE IF:   You have sudden weakness or numbness of the face, arm, or leg, especially on one side of the body.  Your face or eyelid droops to one side.  You have sudden confusion.  You have trouble speaking (aphasia) or understanding.  You have sudden trouble seeing in one or both eyes.  You have sudden trouble walking.  You have dizziness.  You have a loss of balance or coordination.  You have a sudden, severe headache with no known cause.  You have new chest pain or an irregular heartbeat. Any of these symptoms may represent a serious problem that is an emergency. Do not wait to see if the symptoms will   go away. Get medical help at once. Call your local emergency services (911 in [place] Do not drive yourself to the hospital.   This information is not intended to replace advice given to you by your health care provider. Make sure you discuss any questions you have with your health care provider.   Document Released: 12/27/2004 Document Revised: 12/10/2014 Document Reviewed:  05/22/2013 Elsevier Interactive Patient Education 2016 Elsevier Inc.  

## 2016-01-10 ENCOUNTER — Ambulatory Visit: Payer: Medicare Other | Admitting: Family

## 2016-01-10 ENCOUNTER — Encounter (HOSPITAL_COMMUNITY): Payer: Medicare Other

## 2016-03-07 ENCOUNTER — Telehealth: Payer: Self-pay | Admitting: Internal Medicine

## 2016-03-07 NOTE — Telephone Encounter (Signed)
Pt would like to switch to dr Burnice Logan from dr Shawna Orleans. Can I sch?. Pt is requesting internal medicine md

## 2016-03-08 NOTE — Telephone Encounter (Signed)
Please see message. °

## 2016-03-12 NOTE — Telephone Encounter (Signed)
Pt has been scheduled.  °

## 2016-03-12 NOTE — Telephone Encounter (Signed)
lmom for pt to call back

## 2016-03-12 NOTE — Telephone Encounter (Signed)
Okay 

## 2016-03-27 ENCOUNTER — Ambulatory Visit (INDEPENDENT_AMBULATORY_CARE_PROVIDER_SITE_OTHER): Payer: Medicare Other | Admitting: Internal Medicine

## 2016-03-27 ENCOUNTER — Encounter: Payer: Self-pay | Admitting: Internal Medicine

## 2016-03-27 VITALS — BP 138/80 | HR 57 | Temp 98.2°F | Resp 20 | Ht 71.0 in | Wt 194.0 lb

## 2016-03-27 DIAGNOSIS — I6529 Occlusion and stenosis of unspecified carotid artery: Secondary | ICD-10-CM

## 2016-03-27 DIAGNOSIS — I10 Essential (primary) hypertension: Secondary | ICD-10-CM | POA: Diagnosis not present

## 2016-03-27 DIAGNOSIS — E785 Hyperlipidemia, unspecified: Secondary | ICD-10-CM | POA: Diagnosis not present

## 2016-03-27 MED ORDER — PRAVASTATIN SODIUM 40 MG PO TABS: ORAL_TABLET | ORAL | Status: DC

## 2016-03-27 MED ORDER — ASPIRIN 81 MG PO TABS: 81.0000 mg | ORAL_TABLET | Freq: Every day | ORAL | Status: DC

## 2016-03-27 NOTE — Patient Instructions (Signed)
Limit your sodium (Salt) intake  Please check your blood pressure on a regular basis.  If it is consistently greater than 150/90, please make an office appointment.     It is important that you exercise regularly, at least 20 minutes 3 to 4 times per week.  If you develop chest pain or shortness of breath seek  medical attention. 

## 2016-03-27 NOTE — Progress Notes (Signed)
Pre visit review using our clinic review tool, if applicable. No additional management support is needed unless otherwise documented below in the visit note. 

## 2016-03-27 NOTE — Progress Notes (Signed)
Subjective:    Patient ID: Sean Miranda, male    DOB: 14-Apr-1930, 82 y.o.   MRN: [id number]  HPI  82 year old patient who is seen today in follow-up and to transfer from Dr. Lora Havens  practice.  He has a history of coronary atherosclerosis and also prior right carotid endarterectomy.  He is on maximum tolerated statin therapy of pravastatin 40 mg 3 times weekly He has essential hypertension. His cardiac status has been stable.  He does have annual carotid artery Doppler studies.  Denies any exertional chest pain or focal neurological symptoms. He has remote history of sarcoma involving the anterior chest wall  No new concerns or complaints  Past Medical History  Diagnosis Date  . Hyperlipidemia     takes Pravastatin every other day  . Colon polyps   . Pneumonia     back in Feb 2014  . Arthritis     neck   . Calcification of coronary artery   . Hypertension   . Carotid artery occlusion      Social History   Social History  . Marital Status: Married    Spouse Name: N/A  . Number of Children: 3  . Years of Education: N/A   Occupational History  .     Social History Main Topics  . Smoking status: Former Smoker    Types: Cigarettes    Quit date: 12/03/1972  . Smokeless tobacco: Never Used  . Alcohol Use: 4.2 oz/week    7 Glasses of wine per week     Comment: wine nightly  . Drug Use: No  . Sexual Activity: Not on file   Other Topics Concern  . Not on file   Social History Narrative    Past Surgical History  Procedure Laterality Date  . Inguinal hernia repair    . Rotator cuff repair Bilateral 1998, 2001  . Pilonidal cyst drainage    . Tonsillectomy    . Colonoscopy    . Endarterectomy Right 11/10/2013    Procedure: ENDARTERECTOMY CAROTID;  Surgeon: Conrad Stratton, MD;  Location: [facility];  Service: Vascular;  Laterality: Right;  . Patch angioplasty Right 11/10/2013    Procedure: PATCH ANGIOPLASTY;  Surgeon: Conrad Crellin, MD;  Location: [place];  Service: Vascular;   Laterality: Right;  . Nm myocar perf wall motion  04/2011    bruce myoview - perfusion defect in inferior myocardium (diaphragmatic attenuation), remaining myocardium with normal perfusion, EF 67%  . Cardiac catheterization  06/09/1999    mild CAD (LAD, diagonal, RCA) - Dr. Loni Muse. [facility]  . Carotid doppler  02/2012    60-79% RICA stenosis, 123456 LICA stenosis (prior to carotid endarterectomy)  . Carotid endarterectomy Right 11-10-13    cea    Family History  Problem Relation Age of Onset  . Prostate cancer Son   . Stroke Father   . Stroke Brother   . Cancer Brother     spine  . Hypertension Mother     Allergies  Allergen Reactions  . Lipitor [Atorvastatin Calcium] Other (See Comments)    Muscle pain  . Vantin [Cefpodoxime] Rash    Current Outpatient Prescriptions on File Prior to Visit  Medication Sig Dispense Refill  . ALPHA LIPOIC ACID PO Take 300 mg by mouth daily.    . Cholecalciferol (VITAMIN D3) 2000 UNITS capsule Take 2,000 Units by mouth daily. Take 2,000 Units by mouth daily.    . Coenzyme Q10-Levocarnitine 100-20 MG CAPS Take 1 capsule by mouth  daily. Take by mouth.    . Glucosamine-Chondroitin 500-400 MG CAPS Take 1 capsule by mouth daily. Take by mouth.    . Magnesium 500 MG TABS Take 1 tablet by mouth daily.    . Multiple Vitamin (MULTIVITAMIN) tablet Take 0.5 tablets by mouth daily.     . OMEGA-3 1000 MG CAPS Take 4,000 mg by mouth daily.    . Probiotic Product (PROBIOTIC DAILY PO) Take 1 capsule by mouth daily.    Marland Kitchen triamcinolone cream (KENALOG) 0.1 % Apply 1 application topically daily as needed (For rash).      No current facility-administered medications on file prior to visit.    BP 138/80 mmHg  Pulse 57  Temp(Src) 98.2 F (36.8 C) (Oral)  Resp 20  Ht 5\' 11"  (1.803 m)  Wt 194 lb (87.998 kg)  BMI 27.07 kg/m2  SpO2 98%     Review of Systems  Constitutional: Negative for fever, chills, appetite change and fatigue.  HENT: Negative for congestion,  dental problem, ear pain, hearing loss, sore throat, tinnitus, trouble swallowing and voice change.   Eyes: Negative for pain, discharge and visual disturbance.  Respiratory: Negative for cough, chest tightness, wheezing and stridor.   Cardiovascular: Negative for chest pain, palpitations and leg swelling.  Gastrointestinal: Negative for nausea, vomiting, abdominal pain, diarrhea, constipation, blood in stool and abdominal distention.  Genitourinary: Negative for urgency, hematuria, flank pain, discharge, difficulty urinating and genital sores.  Musculoskeletal: Negative for myalgias, back pain, joint swelling, arthralgias, gait problem and neck stiffness.  Skin: Negative for rash.  Neurological: Negative for dizziness, syncope, speech difficulty, weakness, numbness and headaches.  Hematological: Negative for adenopathy. Does not bruise/bleed easily.  Psychiatric/Behavioral: Negative for behavioral problems and dysphoric mood. The patient is not nervous/anxious.        Objective:   Physical Exam  Constitutional: He is oriented to person, place, and time. He appears well-developed.  HENT:  Head: Normocephalic.  Right Ear: External ear normal.  Left Ear: External ear normal.  Eyes: Conjunctivae and EOM are normal.  Neck: Normal range of motion.  Right CEA scar  Cardiovascular: Normal rate and normal heart sounds.   Pulmonary/Chest: Breath sounds normal.  Abdominal: Bowel sounds are normal.  Musculoskeletal: Normal range of motion. He exhibits no edema or tenderness.  Neurological: He is alert and oriented to person, place, and time.  Psychiatric: He has a normal mood and affect. His behavior is normal.          Assessment & Plan:   Essential hypertension, controlled CAD stable Carotid artery stenosis, status post right CEA Remote history of leiomyosarcoma anterior chest wall   No change in medical regimen Annual CPX December

## 2016-04-16 DIAGNOSIS — H40023 Open angle with borderline findings, high risk, bilateral: Secondary | ICD-10-CM | POA: Diagnosis not present

## 2016-05-28 DIAGNOSIS — M25521 Pain in right elbow: Secondary | ICD-10-CM | POA: Diagnosis not present

## 2016-06-06 DIAGNOSIS — C493 Malignant neoplasm of connective and soft tissue of thorax: Secondary | ICD-10-CM | POA: Diagnosis not present

## 2016-06-06 DIAGNOSIS — R918 Other nonspecific abnormal finding of lung field: Secondary | ICD-10-CM | POA: Diagnosis not present

## 2016-06-20 DIAGNOSIS — L309 Dermatitis, unspecified: Secondary | ICD-10-CM | POA: Diagnosis not present

## 2016-07-20 DIAGNOSIS — L239 Allergic contact dermatitis, unspecified cause: Secondary | ICD-10-CM | POA: Diagnosis not present

## 2016-10-08 ENCOUNTER — Ambulatory Visit (INDEPENDENT_AMBULATORY_CARE_PROVIDER_SITE_OTHER): Payer: Medicare Other | Admitting: Family Medicine

## 2016-10-08 ENCOUNTER — Encounter: Payer: Self-pay | Admitting: Family Medicine

## 2016-10-08 VITALS — BP 136/86 | HR 69 | Temp 97.8°F | Ht 71.0 in | Wt 189.2 lb

## 2016-10-08 DIAGNOSIS — K625 Hemorrhage of anus and rectum: Secondary | ICD-10-CM

## 2016-10-08 DIAGNOSIS — R030 Elevated blood-pressure reading, without diagnosis of hypertension: Secondary | ICD-10-CM | POA: Diagnosis not present

## 2016-10-08 DIAGNOSIS — K6289 Other specified diseases of anus and rectum: Secondary | ICD-10-CM | POA: Diagnosis not present

## 2016-10-08 MED ORDER — HYDROCORTISONE 2.5 % RE CREA: 1.0000 "application " | TOPICAL_CREAM | Freq: Two times a day (BID) | RECTAL | 0 refills | Status: DC

## 2016-10-08 NOTE — Progress Notes (Signed)
HPI:   Sean Miranda is a pleasant 82 year old here for an acute visit for "hemorrhoids." He reports he has had flares of hemorrhoids in the past that he usually just treated with over-the-counter options. This flare started about a month ago. He has had some pain with bowel movements and occasional streak of blood on the toilet paper only with bowel movements. This has improved with aquaphor. Has occ constipation - not a change for him. Denies frequent constipation, frequent diarrhea, change in bowel habits, blood coating the stools or filling the toilet, large amounts of blood, persistent or constant pain, fevers, abdominal pain or any other symptoms. Internal hemorrhoids on last colonoscopy. His blood pressure is little elevated on arrival. He reports this is from being late. He will reports that he monitor his blood pressure several times per day at home and that it runs between 100-140 over 60- 70s. He exercises on a regular basis denies any symptoms with exercise. ROS: See pertinent positives and negatives per HPI.  Past Medical History:  Diagnosis Date  . Arthritis    neck   . Calcification of coronary artery   . Carotid artery occlusion   . Colon polyps   . Hyperlipidemia    takes Pravastatin every other day  . Hypertension   . Pneumonia    back in Feb 2014    Past Surgical History:  Procedure Laterality Date  . CARDIAC CATHETERIZATION  06/09/1999   mild CAD (LAD, diagonal, RCA) - Dr. Loni Muse. [facility]  . CAROTID DOPPLER  02/2012   60-79% RICA stenosis, 123456 LICA stenosis (prior to carotid endarterectomy)  . CAROTID ENDARTERECTOMY Right 11-10-13   [facility]  . COLONOSCOPY    . ENDARTERECTOMY Right 11/10/2013   Procedure: ENDARTERECTOMY CAROTID;  Surgeon: Conrad Fontanelle, MD;  Location: [facility];  Service: Vascular;  Laterality: Right;  . INGUINAL HERNIA REPAIR    . NM MYOCAR PERF WALL MOTION  04/2011   bruce myoview - perfusion defect in inferior myocardium (diaphragmatic attenuation),  remaining myocardium with normal perfusion, EF 67%  . PATCH ANGIOPLASTY Right 11/10/2013   Procedure: PATCH ANGIOPLASTY;  Surgeon: Conrad Brownsville, MD;  Location: [facility];  Service: Vascular;  Laterality: Right;  . PILONIDAL CYST DRAINAGE    . ROTATOR CUFF REPAIR Bilateral 1998, 2001  . TONSILLECTOMY      Family History  Problem Relation Age of Onset  . Prostate cancer Son   . Stroke Father   . Stroke Brother   . Cancer Brother     spine  . Hypertension Mother     Social History   Social History  . Marital status: Married    Spouse name: N/A  . Number of children: 3  . Years of education: N/A   Occupational History  .  [place] History Main Topics  . Smoking status: Former Smoker    Types: Cigarettes    Quit date: 12/03/1972  . Smokeless tobacco: Never Used  . Alcohol use 4.2 oz/week    7 Glasses of wine per week     Comment: wine nightly  . Drug use: No  . Sexual activity: Not Asked   Other Topics Concern  . None   Social History Narrative  . None     Current Outpatient Prescriptions:  .  ALPHA LIPOIC ACID PO, Take 300 mg by mouth daily., Disp: , Rfl:  .  aspirin 81 MG tablet, Take 1 tablet (81 mg total) by mouth daily., Disp: 30  tablet, Rfl:  .  Cholecalciferol (VITAMIN D3) 2000 UNITS capsule, Take 2,000 Units by mouth daily. Take 2,000 Units by mouth daily., Disp: , Rfl:  .  Coenzyme Q10-Levocarnitine 100-20 MG CAPS, Take 1 capsule by mouth daily. Take by mouth., Disp: , Rfl:  .  Glucosamine-Chondroitin 500-400 MG CAPS, Take 1 capsule by mouth daily. Take by mouth., Disp: , Rfl:  .  Magnesium 500 MG TABS, Take 1 tablet by mouth daily., Disp: , Rfl:  .  Multiple Vitamin (MULTIVITAMIN) tablet, Take 0.5 tablets by mouth daily. , Disp: , Rfl:  .  OMEGA-3 1000 MG CAPS, Take 4,000 mg by mouth daily., Disp: , Rfl:  .  pravastatin (PRAVACHOL) 40 MG tablet, TAKE 1TABLET BY MOUTH AT BEDTIME., Disp: 90 tablet, Rfl: 3 .  Probiotic Product (PROBIOTIC DAILY PO),  Take 1 capsule by mouth daily., Disp: , Rfl:  .  hydrocortisone (ANUSOL-HC) 2.5 % rectal cream, Place 1 application rectally 2 (two) times daily., Disp: 30 g, Rfl: 0  EXAM:  Vitals:   10/08/16 1447  BP: 136/86  Pulse: 69  Temp: 97.8 F (36.6 C)    Body mass index is 26.39 kg/m.  GENERAL: vitals reviewed and listed above, alert, oriented, appears well hydrated and in no acute distress  HEENT: atraumatic, conjunttiva clear, no obvious abnormalities on inspection of external nose and ears  NECK: no obvious masses on inspection  LUNGS: clear to auscultation bilaterally, no wheezes, rales or rhonchi, good air movement  CV: HRRR, no peripheral edema  RECTAL: several internal hemorrhoid - non-thrombosed, not currently bleeding, ? Small healing fissure 6 O'clock, no bleeding or blood on exam glove  MS: moves all extremities without noticeable abnormality  PSYCH: pleasant and cooperative, no obvious depression or anxiety  ASSESSMENT AND PLAN:  Discussed the following assessment and plan:  Rectal pain BRBPR (bright red blood per rectum) -likely hemorrhoid related, possible small now healing anal fissue -opted for sitz baths, fiber, steroid cream -follow up recheck or sooner if any worsening or new concerns, or not responding to treatment  Elevated blood pressure reading without diagnosis of hypertension -better on recheck -seem home numbers acceptable -contin home monitoring and recheck at follow up    Patient Instructions  BEFORE YOU LEAVE: -make sure you have physical or follow up in: 1-2 months  Sitz baths.  Fiber supplement such as metamucil daily.  Hemorrhoid wipes to keep area clean.  Hemorrhoid creams as provided.  Follow up sooner if worsening, persistent bleeding or not resolving with treatmeng.  Hemorrhoids Hemorrhoids are swollen veins around the rectum or anus. There are two types of hemorrhoids:   Internal hemorrhoids. These occur in the veins just  inside the rectum. They may poke through to the outside and become irritated and painful.  External hemorrhoids. These occur in the veins outside the anus and can be felt as a painful swelling or hard lump near the anus. CAUSES  Pregnancy.   Obesity.   Constipation or diarrhea.   Straining to have a bowel movement.   Sitting for long periods on the toilet.  Heavy lifting or other activity that caused you to strain.  Anal intercourse. SYMPTOMS   Pain.   Anal itching or irritation.   Rectal bleeding.   Fecal leakage.   Anal swelling.   One or more lumps around the anus.  DIAGNOSIS  Your caregiver may be able to diagnose hemorrhoids by visual examination. Other examinations or tests that may be performed include:   Examination of  the rectal area with a gloved hand (digital rectal exam).   Examination of anal canal using a small tube (scope).   A blood test if you have lost a significant amount of blood.  A test to look inside the colon (sigmoidoscopy or colonoscopy). TREATMENT Most hemorrhoids can be treated at home. However, if symptoms do not seem to be getting better or if you have a lot of rectal bleeding, your caregiver may perform a procedure to help make the hemorrhoids get smaller or remove them completely. Possible treatments include:   Placing a rubber band at the base of the hemorrhoid to cut off the circulation (rubber band ligation).   Injecting a chemical to shrink the hemorrhoid (sclerotherapy).   Using a tool to burn the hemorrhoid (infrared light therapy).   Surgically removing the hemorrhoid (hemorrhoidectomy).   Stapling the hemorrhoid to block blood flow to the tissue (hemorrhoid stapling).  HOME CARE INSTRUCTIONS   Eat foods with fiber, such as whole grains, beans, nuts, fruits, and vegetables. Ask your doctor about taking products with added fiber in them (fibersupplements).  Increase fluid intake. Drink enough water and  fluids to keep your urine clear or pale yellow.   Exercise regularly.   Go to the bathroom when you have the urge to have a bowel movement. Do not wait.   Avoid straining to have bowel movements.   Keep the anal area dry and clean. Use wet toilet paper or moist towelettes after a bowel movement.   Medicated creams and suppositories may be used or applied as directed.   Only take over-the-counter or prescription medicines as directed by your caregiver.   Take warm sitz baths for 15-20 minutes, 3-4 times a day to ease pain and discomfort.   Place ice packs on the hemorrhoids if they are tender and swollen. Using ice packs between sitz baths may be helpful.   Put ice in a plastic bag.   Place a towel between your skin and the bag.   Leave the ice on for 15-20 minutes, 3-4 times a day.   Do not use a donut-shaped pillow or sit on the toilet for long periods. This increases blood pooling and pain.  SEEK MEDICAL CARE IF:  You have increasing pain and swelling that is not controlled by treatment or medicine.  You have uncontrolled bleeding.  You have difficulty or you are unable to have a bowel movement.  You have pain or inflammation outside the area of the hemorrhoids. MAKE SURE YOU:  Understand these instructions.  Will watch your condition.  Will get help right away if you are not doing well or get worse.   This information is not intended to replace advice given to you by your health care provider. Make sure you discuss any questions you have with your health care provider.   Document Released: 11/16/2000 Document Revised: 11/05/2012 Document Reviewed: 09/23/2012 Elsevier Interactive Patient Education 2016 [place]., [place]

## 2016-10-08 NOTE — Progress Notes (Signed)
Pre visit review using our clinic review tool, if applicable. No additional management support is needed unless otherwise documented below in the visit note. 

## 2016-10-08 NOTE — Patient Instructions (Addendum)
BEFORE YOU LEAVE: -make sure you have physical or follow up in: 1-2 months  Sitz baths.  Fiber supplement such as metamucil daily.  Hemorrhoid wipes to keep area clean.  Hemorrhoid creams as provided.  Follow up sooner if worsening, persistent bleeding or not resolving with treatmeng.  Hemorrhoids Hemorrhoids are swollen veins around the rectum or anus. There are two types of hemorrhoids:   Internal hemorrhoids. These occur in the veins just inside the rectum. They may poke through to the outside and become irritated and painful.  External hemorrhoids. These occur in the veins outside the anus and can be felt as a painful swelling or hard lump near the anus. CAUSES  Pregnancy.   Obesity.   Constipation or diarrhea.   Straining to have a bowel movement.   Sitting for long periods on the toilet.  Heavy lifting or other activity that caused you to strain.  Anal intercourse. SYMPTOMS   Pain.   Anal itching or irritation.   Rectal bleeding.   Fecal leakage.   Anal swelling.   One or more lumps around the anus.  DIAGNOSIS  Your caregiver may be able to diagnose hemorrhoids by visual examination. Other examinations or tests that may be performed include:   Examination of the rectal area with a gloved hand (digital rectal exam).   Examination of anal canal using a small tube (scope).   A blood test if you have lost a significant amount of blood.  A test to look inside the colon (sigmoidoscopy or colonoscopy). TREATMENT Most hemorrhoids can be treated at home. However, if symptoms do not seem to be getting better or if you have a lot of rectal bleeding, your caregiver may perform a procedure to help make the hemorrhoids get smaller or remove them completely. Possible treatments include:   Placing a rubber band at the base of the hemorrhoid to cut off the circulation (rubber band ligation).   Injecting a chemical to shrink the hemorrhoid  (sclerotherapy).   Using a tool to burn the hemorrhoid (infrared light therapy).   Surgically removing the hemorrhoid (hemorrhoidectomy).   Stapling the hemorrhoid to block blood flow to the tissue (hemorrhoid stapling).  HOME CARE INSTRUCTIONS   Eat foods with fiber, such as whole grains, beans, nuts, fruits, and vegetables. Ask your doctor about taking products with added fiber in them (fibersupplements).  Increase fluid intake. Drink enough water and fluids to keep your urine clear or pale yellow.   Exercise regularly.   Go to the bathroom when you have the urge to have a bowel movement. Do not wait.   Avoid straining to have bowel movements.   Keep the anal area dry and clean. Use wet toilet paper or moist towelettes after a bowel movement.   Medicated creams and suppositories may be used or applied as directed.   Only take over-the-counter or prescription medicines as directed by your caregiver.   Take warm sitz baths for 15-20 minutes, 3-4 times a day to ease pain and discomfort.   Place ice packs on the hemorrhoids if they are tender and swollen. Using ice packs between sitz baths may be helpful.   Put ice in a plastic bag.   Place a towel between your skin and the bag.   Leave the ice on for 15-20 minutes, 3-4 times a day.   Do not use a donut-shaped pillow or sit on the toilet for long periods. This increases blood pooling and pain.  SEEK MEDICAL CARE IF:  You  have increasing pain and swelling that is not controlled by treatment or medicine.  You have uncontrolled bleeding.  You have difficulty or you are unable to have a bowel movement.  You have pain or inflammation outside the area of the hemorrhoids. MAKE SURE YOU:  Understand these instructions.  Will watch your condition.  Will get help right away if you are not doing well or get worse.   This information is not intended to replace advice given to you by your health care provider.  Make sure you discuss any questions you have with your health care provider.   Document Released: 11/16/2000 Document Revised: 11/05/2012 Document Reviewed: 09/23/2012 Elsevier Interactive Patient Education [employer].

## 2016-11-09 DIAGNOSIS — H25813 Combined forms of age-related cataract, bilateral: Secondary | ICD-10-CM | POA: Diagnosis not present

## 2016-11-09 DIAGNOSIS — H40023 Open angle with borderline findings, high risk, bilateral: Secondary | ICD-10-CM | POA: Diagnosis not present

## 2016-11-15 DIAGNOSIS — L814 Other melanin hyperpigmentation: Secondary | ICD-10-CM | POA: Diagnosis not present

## 2016-11-15 DIAGNOSIS — L821 Other seborrheic keratosis: Secondary | ICD-10-CM | POA: Diagnosis not present

## 2016-11-15 DIAGNOSIS — D1801 Hemangioma of skin and subcutaneous tissue: Secondary | ICD-10-CM | POA: Diagnosis not present

## 2016-11-15 DIAGNOSIS — Z85828 Personal history of other malignant neoplasm of skin: Secondary | ICD-10-CM | POA: Diagnosis not present

## 2016-11-15 DIAGNOSIS — D225 Melanocytic nevi of trunk: Secondary | ICD-10-CM | POA: Diagnosis not present

## 2016-11-15 DIAGNOSIS — L570 Actinic keratosis: Secondary | ICD-10-CM | POA: Diagnosis not present

## 2016-11-19 ENCOUNTER — Telehealth: Payer: Self-pay | Admitting: Internal Medicine

## 2016-11-19 NOTE — Telephone Encounter (Signed)
Mr. Samons dropped off information regarding [facility] and a Rx of Sildenafil. He wants this Rx called into the pharmacy for him. I'm leaving the information in Dr. Marthann Schiller folder. Please give Mr. Steadman a phone call if needed.  Pt's ph# [phone] Thank you.

## 2016-11-20 MED ORDER — SILDENAFIL CITRATE 20 MG PO TABS: ORAL_TABLET | ORAL | 4 refills | Status: DC

## 2016-11-20 NOTE — Telephone Encounter (Signed)
Please see message and advise if okay to fill Rx for Sildenafil?

## 2016-11-20 NOTE — Telephone Encounter (Signed)
Spoke to pt, told him Rx sent to pharmacy for Sildenafil 20 mg, take 2-3 tablets daily as PRN. Pt verbalized understanding.

## 2016-11-20 NOTE — Telephone Encounter (Signed)
Okay for sildenafil 20 mg  #60  Refill times 4 2-3 tablets daily as needed for erectile dysfunction

## 2016-12-14 ENCOUNTER — Other Ambulatory Visit (INDEPENDENT_AMBULATORY_CARE_PROVIDER_SITE_OTHER): Payer: Medicare Other

## 2016-12-14 ENCOUNTER — Other Ambulatory Visit: Payer: Medicare Other

## 2016-12-14 DIAGNOSIS — Z0000 Encounter for general adult medical examination without abnormal findings: Secondary | ICD-10-CM | POA: Diagnosis not present

## 2016-12-14 LAB — CBC WITH DIFFERENTIAL/PLATELET
BASOS ABS: 0 10*3/uL (ref 0.0–0.1)
BASOS PCT: 0.3 % (ref 0.0–3.0)
EOS ABS: 0.2 10*3/uL (ref 0.0–0.7)
Eosinophils Relative: 3.7 % (ref 0.0–5.0)
HEMATOCRIT: 41.7 % (ref 39.0–52.0)
HEMOGLOBIN: 14.4 g/dL (ref 13.0–17.0)
LYMPHS PCT: 37.4 % (ref 12.0–46.0)
Lymphs Abs: 1.9 10*3/uL (ref 0.7–4.0)
MCHC: 34.6 g/dL (ref 30.0–36.0)
MCV: 91.7 fl (ref 78.0–100.0)
Monocytes Absolute: 0.5 10*3/uL (ref 0.1–1.0)
Monocytes Relative: 10 % (ref 3.0–12.0)
Neutro Abs: 2.4 10*3/uL (ref 1.4–7.7)
Neutrophils Relative %: 48.6 % (ref 43.0–77.0)
PLATELETS: 237 10*3/uL (ref 150.0–400.0)
RBC: 4.55 Mil/uL (ref 4.22–5.81)
RDW: 13.2 % (ref 11.5–15.5)
WBC: 5 10*3/uL (ref 4.0–10.5)

## 2016-12-14 LAB — HEPATIC FUNCTION PANEL
ALK PHOS: 54 U/L (ref 39–117)
ALT: 12 U/L (ref 0–53)
AST: 16 U/L (ref 0–37)
Albumin: 4 g/dL (ref 3.5–5.2)
BILIRUBIN DIRECT: 0.1 mg/dL (ref 0.0–0.3)
BILIRUBIN TOTAL: 0.5 mg/dL (ref 0.2–1.2)
Total Protein: 6.5 g/dL (ref 6.0–8.3)

## 2016-12-14 LAB — POC URINALSYSI DIPSTICK (AUTOMATED)
BILIRUBIN UA: NEGATIVE
GLUCOSE UA: NEGATIVE
KETONES UA: NEGATIVE
Leukocytes, UA: NEGATIVE
Nitrite, UA: NEGATIVE
Protein, UA: NEGATIVE
RBC UA: NEGATIVE
SPEC GRAV UA: 1.015
Urobilinogen, UA: 0.2
pH, UA: 7

## 2016-12-14 LAB — BASIC METABOLIC PANEL
BUN: 16 mg/dL (ref 6–23)
CO2: 29 mEq/L (ref 19–32)
Calcium: 9.2 mg/dL (ref 8.4–10.5)
Chloride: 104 mEq/L (ref 96–112)
Creatinine, Ser: 0.98 mg/dL (ref 0.40–1.50)
GFR: 77.27 mL/min (ref >60.00)
Glucose, Bld: 89 mg/dL (ref 70–99)
Potassium: 4.9 mEq/L (ref 3.5–5.1)
Sodium: 138 mEq/L (ref 135–145)

## 2016-12-14 LAB — LIPID PANEL
CHOL/HDL RATIO: 5
Cholesterol: 229 mg/dL — ABNORMAL HIGH (ref 0–200)
HDL: 44.4 mg/dL (ref >39.00)
LDL CALC: 170 mg/dL — AB (ref 0–99)
NONHDL: 184.17
TRIGLYCERIDES: 73 mg/dL (ref 0.0–149.0)
VLDL: 14.6 mg/dL (ref 0.0–40.0)

## 2016-12-14 LAB — TSH: TSH: 1.79 u[IU]/mL (ref 0.35–4.50)

## 2016-12-21 ENCOUNTER — Ambulatory Visit (INDEPENDENT_AMBULATORY_CARE_PROVIDER_SITE_OTHER): Payer: Medicare Other | Admitting: Internal Medicine

## 2016-12-21 ENCOUNTER — Encounter: Payer: Self-pay | Admitting: Internal Medicine

## 2016-12-21 VITALS — BP 142/72 | HR 56 | Temp 97.4°F | Ht 70.5 in | Wt 191.0 lb

## 2016-12-21 DIAGNOSIS — I6529 Occlusion and stenosis of unspecified carotid artery: Secondary | ICD-10-CM

## 2016-12-21 DIAGNOSIS — E781 Pure hyperglyceridemia: Secondary | ICD-10-CM | POA: Diagnosis not present

## 2016-12-21 DIAGNOSIS — I2510 Atherosclerotic heart disease of native coronary artery without angina pectoris: Secondary | ICD-10-CM

## 2016-12-21 DIAGNOSIS — Z0000 Encounter for general adult medical examination without abnormal findings: Secondary | ICD-10-CM

## 2016-12-21 DIAGNOSIS — I10 Essential (primary) hypertension: Secondary | ICD-10-CM | POA: Diagnosis not present

## 2016-12-21 MED ORDER — PRAVASTATIN SODIUM 40 MG PO TABS: ORAL_TABLET | ORAL | 3 refills | Status: DC

## 2016-12-21 NOTE — Patient Instructions (Signed)
Limit your sodium (Salt) intake  Please check your blood pressure on a regular basis.  If it is consistently greater than 150/90, please make an office appointment.    It is important that you exercise regularly, at least 20 minutes 3 to 4 times per week.  If you develop chest pain or shortness of breath seek  medical attention.  Continue heart healthy diet  Follow-up at [facility] semiannually  Annual carotid artery Doppler studies  Return in one year for follow-up

## 2016-12-21 NOTE — Progress Notes (Signed)
Subjective:    Patient ID: Sean Miranda, male    DOB: 1930/01/24, 83 y.o.   MRN: [id number]  HPI 03/27/2016. 83 year old patient who is seen today in follow-up and to transfer from Dr. Lora Havens  practice.  He has a history of coronary atherosclerosis and also prior right carotid endarterectomy.  He is on maximum tolerated statin therapy of pravastatin 40 mg 3 times weekly He has essential hypertension. His cardiac status has been stable.  He does have annual carotid artery Doppler studies.  Denies any exertional chest pain or focal neurological symptoms. He has remote history of leiomyosarcoma involving the anterior chest wall  12/21/2016. 83 year old patient who is seen today for a preventive health examination as well as a Medicare wellness visit.  He continues to do quite well.  He is followed biannually at [facility] due to a history of a Leomyosarcoma involving the anterior chest wall.  CT of the chest was unremarkable in July of last year.  He is scheduled for follow-up later this month He has a history of carotid artery disease and is status post right CEA.  He is scheduled for follow-up carotid ultrasound next month.  Doing well without concerns or complaints  Past Medical History:  Diagnosis Date  . Arthritis    neck   . Calcification of coronary artery   . Carotid artery occlusion   . Colon polyps   . Hyperlipidemia    takes Pravastatin every other day  . Hypertension   . Pneumonia    back in Feb 2014     Social History   Social History  . Marital status: Married    Spouse name: N/A  . Number of children: 3  . Years of education: N/A   Occupational History  .  [place] History Main Topics  . Smoking status: Former Smoker    Types: Cigarettes    Quit date: 12/03/1972  . Smokeless tobacco: Never Used  . Alcohol use 4.2 oz/week    7 Glasses of wine per week     Comment: wine nightly  . Drug use: No  . Sexual activity: Not on file   Other  Topics Concern  . Not on file   Social History Narrative  . No narrative on file    Past Surgical History:  Procedure Laterality Date  . CARDIAC CATHETERIZATION  06/09/1999   mild CAD (LAD, diagonal, RCA) - Dr. Loni Muse. [place]  . CAROTID DOPPLER  02/2012   60-79% RICA stenosis, 123456 LICA stenosis (prior to carotid endarterectomy)  . CAROTID ENDARTERECTOMY Right 11-10-13   cea  . COLONOSCOPY    . ENDARTERECTOMY Right 11/10/2013   Procedure: ENDARTERECTOMY CAROTID;  Surgeon: Conrad [place], MD;  Location: [place];  Service: Vascular;  Laterality: Right;  . INGUINAL HERNIA REPAIR    . NM MYOCAR PERF WALL MOTION  04/2011   bruce myoview - perfusion defect in inferior myocardium (diaphragmatic attenuation), remaining myocardium with normal perfusion, EF 67%  . PATCH ANGIOPLASTY Right 11/10/2013   Procedure: PATCH ANGIOPLASTY;  Surgeon: Conrad Eunice, MD;  Location: [place];  Service: Vascular;  Laterality: Right;  . PILONIDAL CYST DRAINAGE    . ROTATOR CUFF REPAIR Bilateral 1998, 2001  . TONSILLECTOMY      Family History  Problem Relation Age of Onset  . Prostate cancer Son   . Stroke Father   . Stroke Brother   . Cancer Brother     spine  . Hypertension  Mother     Allergies  Allergen Reactions  . Lipitor [Atorvastatin Calcium] Other (See Comments)    Muscle pain  . Vantin [Cefpodoxime] Rash    Current Outpatient Prescriptions on File Prior to Visit  Medication Sig Dispense Refill  . ALPHA LIPOIC ACID PO Take 300 mg by mouth daily.    Marland Kitchen aspirin 81 MG tablet Take 1 tablet (81 mg total) by mouth daily. 30 tablet   . Cholecalciferol (VITAMIN D3) 2000 UNITS capsule Take 2,000 Units by mouth daily. Take 2,000 Units by mouth daily.    . Coenzyme Q10-Levocarnitine 100-20 MG CAPS Take 1 capsule by mouth daily. Take by mouth.    . Glucosamine-Chondroitin 500-400 MG CAPS Take 1 capsule by mouth daily. Take by mouth.    . hydrocortisone (ANUSOL-HC) 2.5 % rectal cream Place 1 application rectally 2  (two) times daily. 30 g 0  . Magnesium 500 MG TABS Take 1 tablet by mouth daily.    . Multiple Vitamin (MULTIVITAMIN) tablet Take 0.5 tablets by mouth daily.     . OMEGA-3 1000 MG CAPS Take 4,000 mg by mouth daily.    . Probiotic Product (PROBIOTIC DAILY PO) Take 1 capsule by mouth daily.    . sildenafil (REVATIO) 20 MG tablet TAKE 2-3 TABLETS DAILY AS NEEDED FOR ERECTILE DYSFUNCTION. 60 tablet 4   No current facility-administered medications on file prior to visit.     BP (!) 142/72 (BP Location: Right Arm, Patient Position: Sitting, Cuff Size: Normal)   Pulse (!) 56   Temp 97.4 F (36.3 C) (Oral)   Ht 5' 10.5" (1.791 m)   Wt 191 lb (86.6 kg)   SpO2 98%   BMI 27.02 kg/m   Medicare wellness visit  1. Risk factors, based on past  M,S,F history.  Patient has known atherosclerosis and is status post right CEA.  Cardiovascular risk factors include dyslipidemia  2.  Physical activities: remains quite active.  Does walk several times per week.  Does some light weight training at home.  He has wife are quite active with travel  3.  Depression/mood:no history of major depression or mood disorder  4.  Hearing: uses hearing aids  5.  ADL's:independent in all aspects of daily living  6.  Fall risk:low  7.  Home safety:no problems identified  8.  Height weight, and visual acuity;height and weight stable no change in visual acuity.  Is followed by ophthalmology annually  9.  Counseling:continue active lirt healthy diet  10. Lab orders based on risk factors:laboratory profile reviewed and discussed.  Patient given a copy  11. Referral :follow-up [facility]  12. Care plan: we'll continue follow-up at [facility] by annually.  Continue annual carotid artery Doppler ultrasounds as well as annual clinical exams  13. Cognitive assessment: alert in orderognitive dysfunction  14. Screening: Patient provided with a written and personalized 5-10 year screening schedule in the AVS.    15.  Provider List Update: oncology.  Primary care and radiology   Review of Systems  Constitutional: Negative for appetite change, chills, fatigue and fever.  HENT: Negative for congestion, dental problem, ear pain, hearing loss, sore throat, tinnitus, trouble swallowing and voice change.   Eyes: Negative for pain, discharge and visual disturbance.  Respiratory: Negative for cough, chest tightness, wheezing and stridor.   Cardiovascular: Negative for chest pain, palpitations and leg swelling.  Gastrointestinal: Negative for abdominal distention, abdominal pain, blood in stool, constipation, diarrhea, nausea and vomiting.  Genitourinary: Negative for difficulty urinating,  discharge, flank pain, genital sores, hematuria and urgency.  Musculoskeletal: Negative for arthralgias, back pain, gait problem, joint swelling, myalgias and neck stiffness.  Skin: Negative for rash.  Neurological: Negative for dizziness, syncope, speech difficulty, weakness, numbness and headaches.  Hematological: Negative for adenopathy. Does not bruise/bleed easily.  Psychiatric/Behavioral: Negative for behavioral problems and dysphoric mood. The patient is not nervous/anxious.        Objective:   Physical Exam  Constitutional: He appears well-developed and well-nourished.  HENT:  Head: Normocephalic and atraumatic.  Right Ear: External ear normal.  Left Ear: External ear normal.  Nose: Nose normal.  Mouth/Throat: Oropharynx is clear and moist.  Eyes: Conjunctivae and EOM are normal. Pupils are equal, round, and reactive to light. No scleral icterus.  Neck: Normal range of motion. Neck supple. No JVD present. No thyromegaly present.  Right supraclavicular bruit Status post right CEA  Cardiovascular: Regular rhythm, normal heart sounds and intact distal pulses.  Exam reveals no gallop and no friction rub.   No murmur heard. Pulmonary/Chest: Effort normal and breath sounds normal. He exhibits no tenderness.  Surgical  scar mid anterior chest wall  Abdominal: Soft. Bowel sounds are normal. He exhibits no distension and no mass. There is no tenderness.  Genitourinary: Prostate normal and penis normal.  Musculoskeletal: Normal range of motion. He exhibits no edema or tenderness.  Lymphadenopathy:    He has no cervical adenopathy.  Neurological: He is alert. He has normal reflexes. No cranial nerve deficit. Coordination normal.  Skin: Skin is warm and dry. No rash noted.  Psychiatric: He has a normal mood and affect. His behavior is normal.          Assessment & Plan:   Preventive health examination Medicare wellness visit Carotid artery disease.  Follow carotid artery Doppler study next month as scheduled Dyslipidemia.  Continue maximum tolerated statin dose History of leiomyosarcoma anterior chest wall.  Follow-up [facility]  Continue heart healthy diet and regular exercise Continue home blood pressure monitoring Recheck one year or as needed  [employer]

## 2016-12-21 NOTE — Progress Notes (Signed)
Pre visit review using our clinic review tool, if applicable. No additional management support is needed unless otherwise documented below in the visit note. 

## 2016-12-31 ENCOUNTER — Encounter: Payer: Self-pay | Admitting: Family

## 2017-01-01 DIAGNOSIS — Z08 Encounter for follow-up examination after completed treatment for malignant neoplasm: Secondary | ICD-10-CM | POA: Diagnosis not present

## 2017-01-01 DIAGNOSIS — R918 Other nonspecific abnormal finding of lung field: Secondary | ICD-10-CM | POA: Diagnosis not present

## 2017-01-01 DIAGNOSIS — Z9889 Other specified postprocedural states: Secondary | ICD-10-CM | POA: Diagnosis not present

## 2017-01-01 DIAGNOSIS — C493 Malignant neoplasm of connective and soft tissue of thorax: Secondary | ICD-10-CM | POA: Diagnosis not present

## 2017-01-01 DIAGNOSIS — Z85831 Personal history of malignant neoplasm of soft tissue: Secondary | ICD-10-CM | POA: Diagnosis not present

## 2017-01-04 ENCOUNTER — Ambulatory Visit (INDEPENDENT_AMBULATORY_CARE_PROVIDER_SITE_OTHER): Payer: Medicare Other | Admitting: Family

## 2017-01-04 ENCOUNTER — Encounter: Payer: Self-pay | Admitting: Family

## 2017-01-04 ENCOUNTER — Ambulatory Visit (HOSPITAL_COMMUNITY)
Admission: RE | Admit: 2017-01-04 | Discharge: 2017-01-04 | Disposition: A | Discharge status: Home / Self Care | Payer: Medicare Other | Source: Ambulatory Visit | Attending: Family | Admitting: Family

## 2017-01-04 VITALS — BP 138/58 | HR 53 | Temp 97.3°F | Resp 20 | Ht 70.5 in | Wt 191.4 lb

## 2017-01-04 DIAGNOSIS — Z9889 Other specified postprocedural states: Secondary | ICD-10-CM | POA: Insufficient documentation

## 2017-01-04 DIAGNOSIS — I6523 Occlusion and stenosis of bilateral carotid arteries: Secondary | ICD-10-CM

## 2017-01-04 DIAGNOSIS — Z48812 Encounter for surgical aftercare following surgery on the circulatory system: Secondary | ICD-10-CM | POA: Diagnosis not present

## 2017-01-04 LAB — VAS US CAROTID
LCCAPDIAS: 21 cm/s
LEFT ECA DIAS: -22 cm/s
LEFT VERTEBRAL DIAS: -11 cm/s
Left CCA dist dias: -20 cm/s
Left CCA dist sys: -86 cm/s
Left CCA prox sys: 110 cm/s
Left ICA dist dias: -33 cm/s
Left ICA dist sys: -107 cm/s
Left ICA prox dias: -24 cm/s
Left ICA prox sys: -92 cm/s
RCCAPDIAS: 19 cm/s
RCCAPSYS: 106 cm/s
RIGHT CCA MID DIAS: 21 cm/s
RIGHT ECA DIAS: 21 cm/s
RIGHT VERTEBRAL DIAS: -7 cm/s
Right cca dist sys: -107 cm/s

## 2017-01-04 NOTE — Progress Notes (Signed)
Chief Complaint: Follow up Extracranial Carotid Artery Stenosis   History of Present Illness  Sean Miranda is a 83 y.o. male patient of Dr. Bridgett Larsson who is s/p R CEA (Date: 11/10/13). He returns today for surveillance and follow up.  The patient denies any history of TIA or stroke symptoms.Specifically he denies a history of amaurosis fugax or monocular blindness, unilateral facial drooping, hemiparesis, or receptive or expressive aphasia.   He denies tingling, numbness, pain, or weakness in either UE, denies dizziness. He denies claudication symptoms with walking, denies no healing wounds.  He reports a strong family hx of strokes.  Pt denies New Medical or Surgical History. He exercises 45 minutes at least 3-4 days/week. He had a stress test in 2015, Dr. Debara Pickett. He denies any history of MI or any cardiac problems.  Pt Diabetic: No Pt smoker: former smoker, quit in 1974  Pt meds include: Statin : Yes, qod ASA: Yes, 81 mg qod Other anticoagulants/antiplatelets: no    Past Medical History:  Diagnosis Date  . Arthritis    neck   . Calcification of coronary artery   . Carotid artery occlusion   . Colon polyps   . Hyperlipidemia    takes Pravastatin every other day  . Hypertension   . Pneumonia    back in Feb 2014    Social History Social History  Substance Use Topics  . Smoking status: Former Smoker    Types: Cigarettes    Quit date: 12/03/1972  . Smokeless tobacco: Never Used  . Alcohol use 4.2 oz/week    7 Glasses of wine per week     Comment: wine nightly    Family History Family History  Problem Relation Age of Onset  . Stroke Father   . Hypertension Mother   . Prostate cancer Son   . Stroke Brother   . Cancer Brother     spine    Surgical History Past Surgical History:  Procedure Laterality Date  . CARDIAC CATHETERIZATION  06/09/1999   mild CAD (LAD, diagonal, RCA) - Dr. Loni Muse. [facility]  . CAROTID DOPPLER  02/2012   60-79% RICA stenosis, 123456  LICA stenosis (prior to carotid endarterectomy)  . CAROTID ENDARTERECTOMY Right 11-10-13   cea  . COLONOSCOPY    . ENDARTERECTOMY Right 11/10/2013   Procedure: ENDARTERECTOMY CAROTID;  Surgeon: Conrad Louann, MD;  Location: [place];  Service: Vascular;  Laterality: Right;  . INGUINAL HERNIA REPAIR    . NM MYOCAR PERF WALL MOTION  04/2011   bruce myoview - perfusion defect in inferior myocardium (diaphragmatic attenuation), remaining myocardium with normal perfusion, EF 67%  . PATCH ANGIOPLASTY Right 11/10/2013   Procedure: PATCH ANGIOPLASTY;  Surgeon: Conrad , MD;  Location: [place];  Service: Vascular;  Laterality: Right;  . PILONIDAL CYST DRAINAGE    . ROTATOR CUFF REPAIR Bilateral 1998, 2001  . TONSILLECTOMY      Allergies  Allergen Reactions  . Lipitor [Atorvastatin Calcium] Other (See Comments)    Muscle pain  . Vantin [Cefpodoxime] Rash    Current Outpatient Prescriptions  Medication Sig Dispense Refill  . ALPHA LIPOIC ACID PO Take 300 mg by mouth daily.    Marland Kitchen aspirin 81 MG tablet Take 1 tablet (81 mg total) by mouth daily. 30 tablet   . Cholecalciferol (VITAMIN D3) 2000 UNITS capsule Take 2,000 Units by mouth daily. Take 2,000 Units by mouth daily.    . Coenzyme Q10-Levocarnitine 100-20 MG CAPS Take 1 capsule by mouth daily.  Take by mouth.    . Glucosamine-Chondroitin 500-400 MG CAPS Take 1 capsule by mouth daily. Take by mouth.    . hydrocortisone (ANUSOL-HC) 2.5 % rectal cream Place 1 application rectally 2 (two) times daily. 30 g 0  . Magnesium 500 MG TABS Take 1 tablet by mouth daily.    . Multiple Vitamin (MULTIVITAMIN) tablet Take 0.5 tablets by mouth daily.     . OMEGA-3 1000 MG CAPS Take 4,000 mg by mouth daily.    . pravastatin (PRAVACHOL) 40 MG tablet TAKE 1TABLET BY MOUTH AT BEDTIME. 90 tablet 3  . Probiotic Product (PROBIOTIC DAILY PO) Take 1 capsule by mouth daily.    . sildenafil (REVATIO) 20 MG tablet TAKE 2-3 TABLETS DAILY AS NEEDED FOR ERECTILE DYSFUNCTION. 60  tablet 4   No current facility-administered medications for this visit.     Review of Systems : See HPI for pertinent positives and negatives.  Physical Examination  Vitals:   01/04/17 0921 01/04/17 0923  BP: (!) 121/55 (!) 138/58  Pulse: (!) 53   Resp: 20   Temp: 97.3 F (36.3 C)   TempSrc: Oral   SpO2: 99%   Weight: 191 lb 6.4 oz (86.8 kg)   Height: 5' 10.5" (1.791 m)    Body mass index is 27.07 kg/m.  General: A&O x 3, WD, fit appearing, appears younger than stated age.  Eyes: PERRLA  Pulmonary: Sym exp, good air movement in all fields, CTAB, no rales, rhonchi, or wheezing  Cardiac: RRR, Nl S1, S2, no detected murmur  Vascular:  Vessel  Right  Left   Radial  2+ Palpable  2+Palpable   Carotid  Palpable, without bruit  Palpable, without bruit   Popliteal  Not palpable  Not palpable   PT  2+Palpable  2+ Palpable   DP  2+Palpable  Not Palpable    Gastrointestinal: soft, NTND, -G/R, - HSM, - palpable masses, - CVAT B Musculoskeletal: M/S 5/5 throughout , Extremities without ischemic changes  Neurologic: CN 2-12 intact , Pain and light touch intact in extremities , Motor exam as listed above  Psychiatric: Judgment intact, Mood & affect appropriate for pt's clinical situation  Dermatologic: See M/S exam for extremity exam, no rashes otherwise noted      Assessment: Sean Miranda is a 83 y.o. male who who is s/p R CEA (Date: 11/10/13). He has no hx of stroke or TIA.  DATA Today's carotid duplex suggests right carotid endarterectomy site with evidence of mild hyperplasia; elevated velocities at the distal patch may be due to a change in vessel diameter and hyperplasia. Left internal carotid artery velocities suggest a <40% stenosis. Bilateral vertebral artery flow is antegrade.  Bilateral subclavian artery waveforms are normal.  No significant change in comparison to the last exam on 01-04-2016.  Plan:  Follow-up in 1  year with Carotid Duplex scan.  I discussed in depth with the patient the nature of atherosclerosis, and emphasized the importance of maximal medical management including strict control of blood pressure, blood glucose, and lipid levels, obtaining regular exercise, and continued cessation of smoking.  The patient is aware that without maximal medical management the underlying atherosclerotic disease process will progress, limiting the benefit of any interventions. The patient was given information about stroke prevention and what symptoms should prompt the patient to seek immediate medical care. Thank you for allowing [place] to participate in this patient's care.  Clemon Chambers, RN, MSN, FNP-C [facility] Office: [phone]  Clinic Physician: Cain/Chen  01/04/17 9:30 AM

## 2017-01-04 NOTE — Patient Instructions (Signed)
Stroke Prevention Some medical conditions and behaviors are associated with an increased chance of having a stroke. You may prevent a stroke by making healthy choices and managing medical conditions. How can I reduce my risk of having a stroke?  Stay physically active. Get at least 30 minutes of activity on most or all days.  Do not smoke. It may also be helpful to avoid exposure to secondhand smoke.  Limit alcohol use. Moderate alcohol use is considered to be:  No more than 2 drinks per day for men.  No more than 1 drink per day for nonpregnant women.  Eat healthy foods. This involves:  Eating 5 or more servings of fruits and vegetables a day.  Making dietary changes that address high blood pressure (hypertension), high cholesterol, diabetes, or obesity.  Manage your cholesterol levels.  Making food choices that are high in fiber and low in saturated fat, trans fat, and cholesterol may control cholesterol levels.  Take any prescribed medicines to control cholesterol as directed by your health care provider.  Manage your diabetes.  Controlling your carbohydrate and sugar intake is recommended to manage diabetes.  Take any prescribed medicines to control diabetes as directed by your health care provider.  Control your hypertension.  Making food choices that are low in salt (sodium), saturated fat, trans fat, and cholesterol is recommended to manage hypertension.  Ask your health care provider if you need treatment to lower your blood pressure. Take any prescribed medicines to control hypertension as directed by your health care provider.  If you are 18-39 years of age, have your blood pressure checked every 3-5 years. If you are 40 years of age or older, have your blood pressure checked every year.  Maintain a healthy weight.  Reducing calorie intake and making food choices that are low in sodium, saturated fat, trans fat, and cholesterol are recommended to manage  weight.  Stop drug abuse.  Avoid taking birth control pills.  Talk to your health care provider about the risks of taking birth control pills if you are over 35 years old, smoke, get migraines, or have ever had a blood clot.  Get evaluated for sleep disorders (sleep apnea).  Talk to your health care provider about getting a sleep evaluation if you snore a lot or have excessive sleepiness.  Take medicines only as directed by your health care provider.  For some people, aspirin or blood thinners (anticoagulants) are helpful in reducing the risk of forming abnormal blood clots that can lead to stroke. If you have the irregular heart rhythm of atrial fibrillation, you should be on a blood thinner unless there is a good reason you cannot take them.  Understand all your medicine instructions.  Make sure that other conditions (such as anemia or atherosclerosis) are addressed. Get help right away if:  You have sudden weakness or numbness of the face, arm, or leg, especially on one side of the body.  Your face or eyelid droops to one side.  You have sudden confusion.  You have trouble speaking (aphasia) or understanding.  You have sudden trouble seeing in one or both eyes.  You have sudden trouble walking.  You have dizziness.  You have a loss of balance or coordination.  You have a sudden, severe headache with no known cause.  You have new chest pain or an irregular heartbeat. Any of these symptoms may represent a serious problem that is an emergency. Do not wait to see if the symptoms will go away.   Get medical help at once. Call your local emergency services (911 in [place] Do not drive yourself to the hospital. This information is not intended to replace advice given to you by your health care provider. Make sure you discuss any questions you have with your health care provider. Document Released: 12/27/2004 Document Revised: 04/26/2016 Document Reviewed: 05/22/2013 Elsevier  Interactive Patient Education  2017 Elsevier Inc.  

## 2017-01-09 NOTE — Addendum Note (Signed)
Addended by: Lianne Cure A on: 01/09/2017 10:06 AM   Modules accepted: Orders

## 2017-02-06 IMAGING — CT CT CHEST W/O CM
1 series · 15 of 31 positions shown, 19 images · IV contrast (Omnipaque 300)
Comparison: 02/09/2015 initial prior chest CT

CLINICAL DATA: Follow-up right lung nodule

EXAM:
CT CHEST WITHOUT CONTRAST
TECHNIQUE: Multidetector CT imaging of the chest was performed following the
standard protocol without IV contrast.

[Series 2: chest routine with · axial · 0.73mm/px · z∈[-335,-5]mm · 15 of 72 slices shown, 19 images]
[im 3/72  mediastinal]
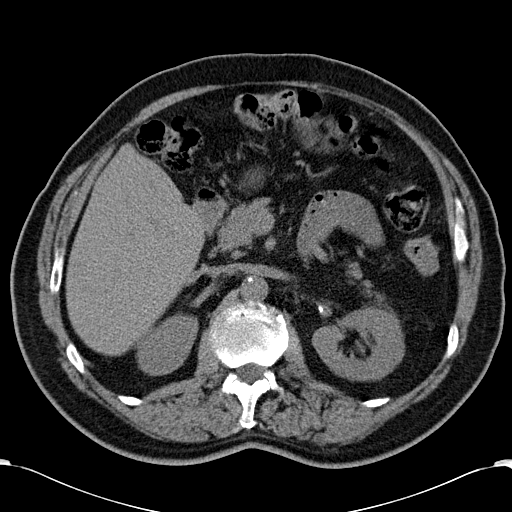
[im 3/72  lung]
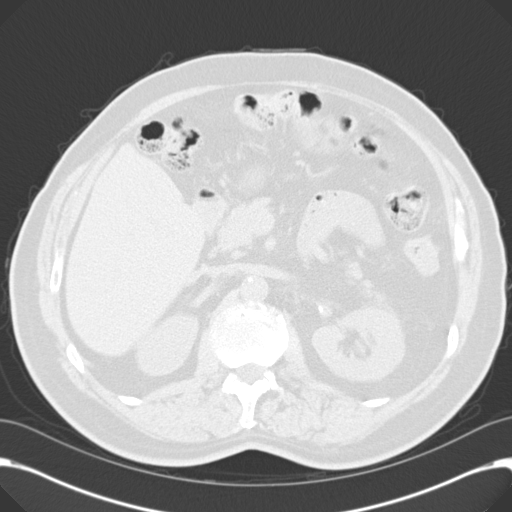
[im 8/72  lung]
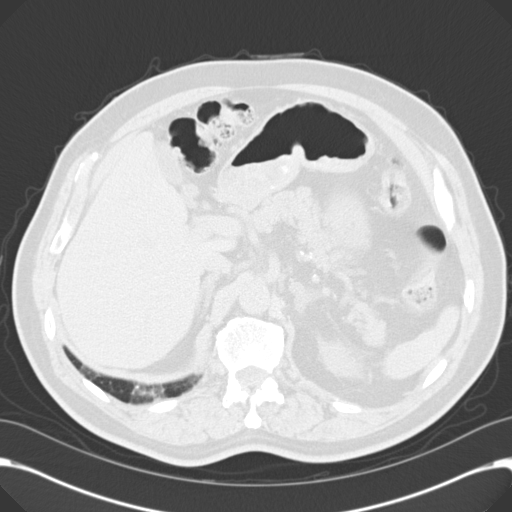
[im 14/72  lung]
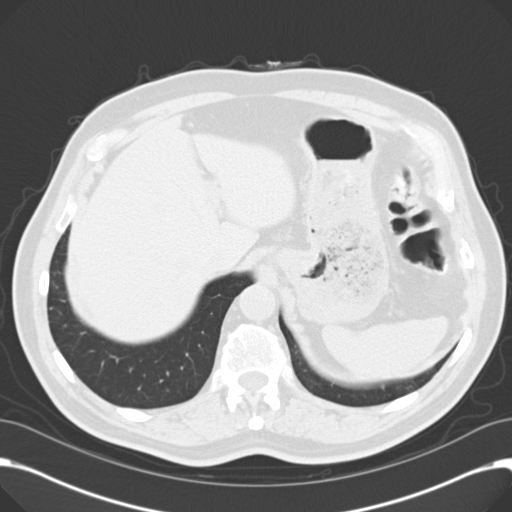
[im 16/72  lung]
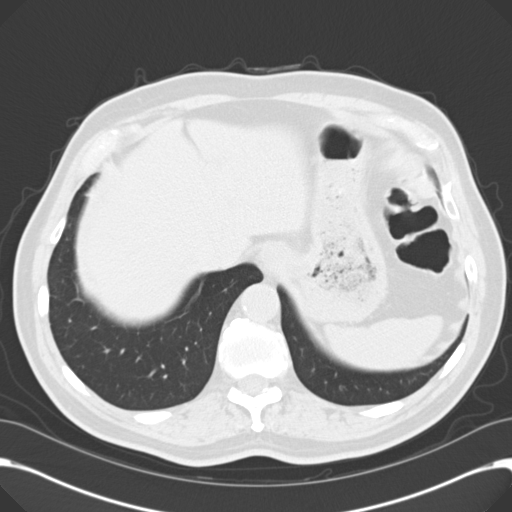
[im 22/72  mediastinal]
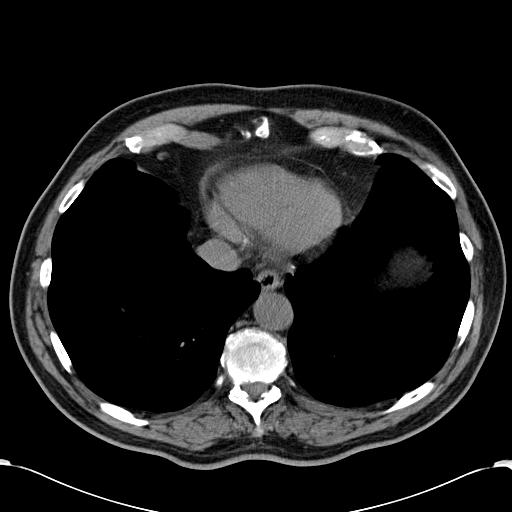
[im 22/72  lung]
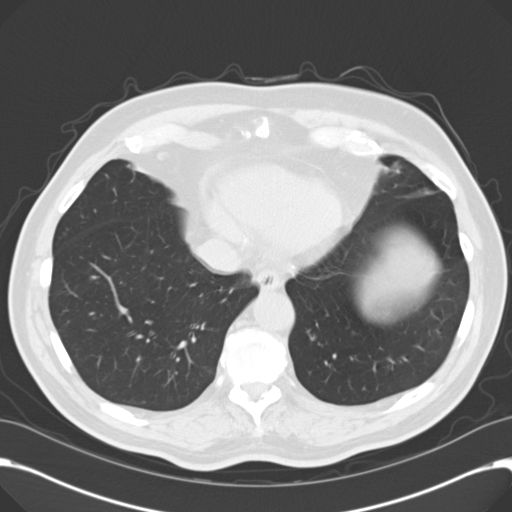
[im 27/72  lung]
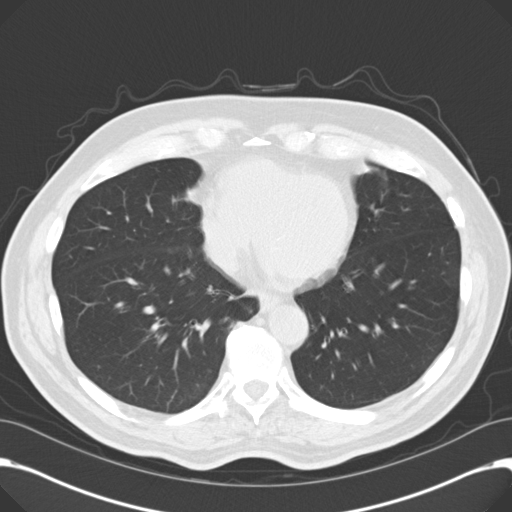
[im 32/72  lung]
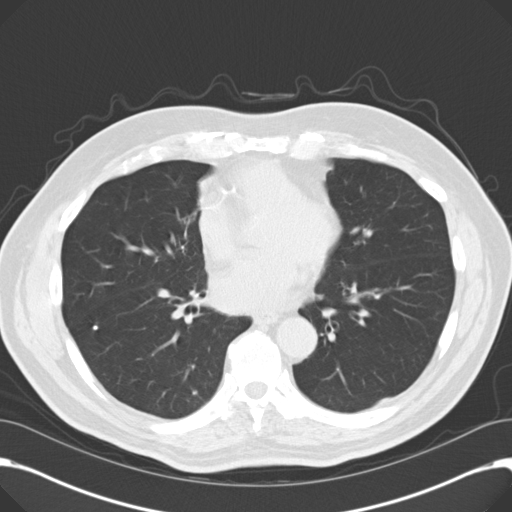
[im 37/72  lung]
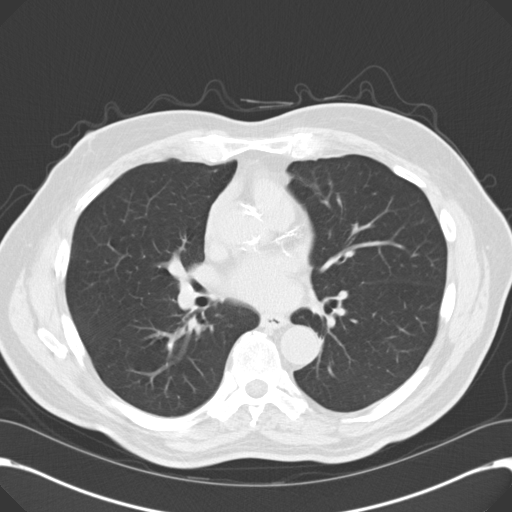
[im 40/72  mediastinal]
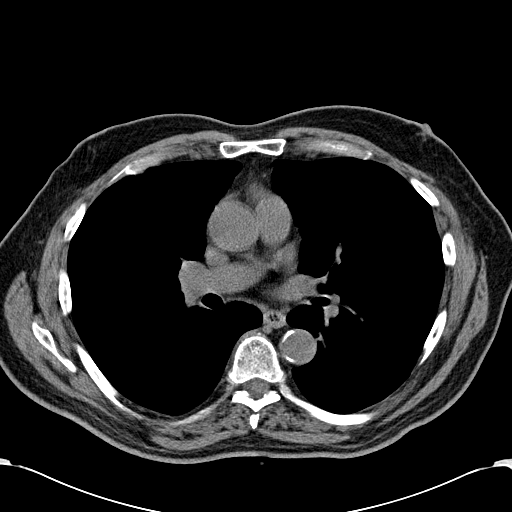
[im 40/72  lung]
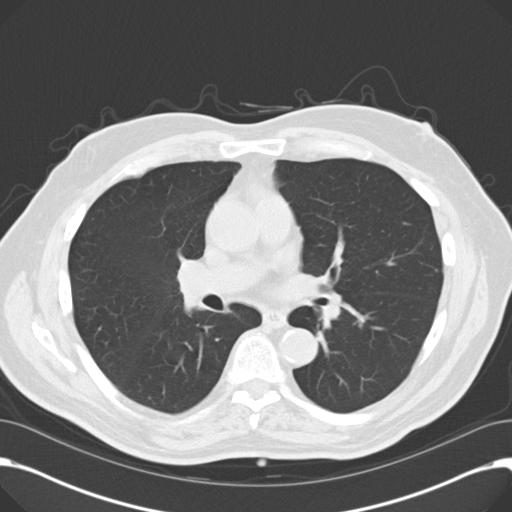
[im 45/72  lung]
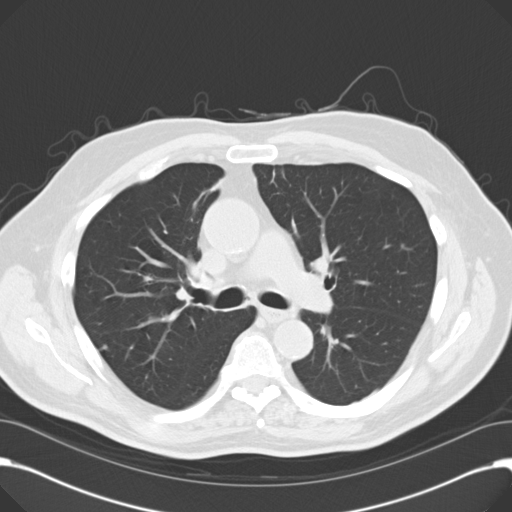
[im 50/72  lung]
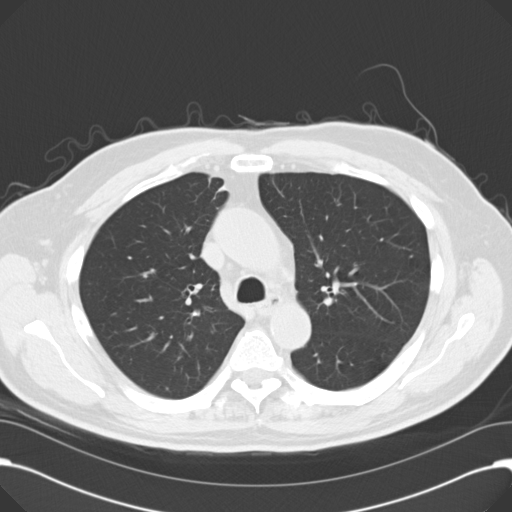
[im 56/72  lung]
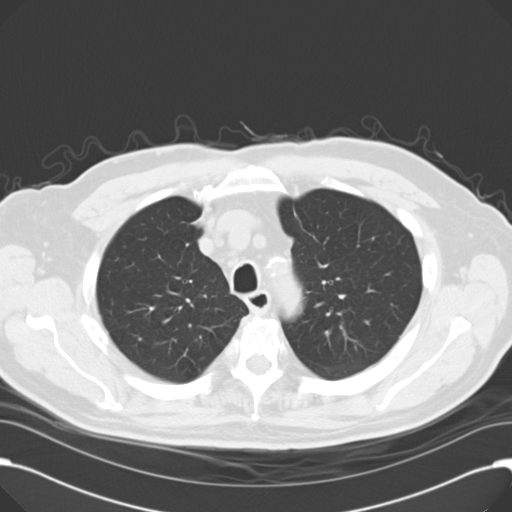
[im 58/72  mediastinal]
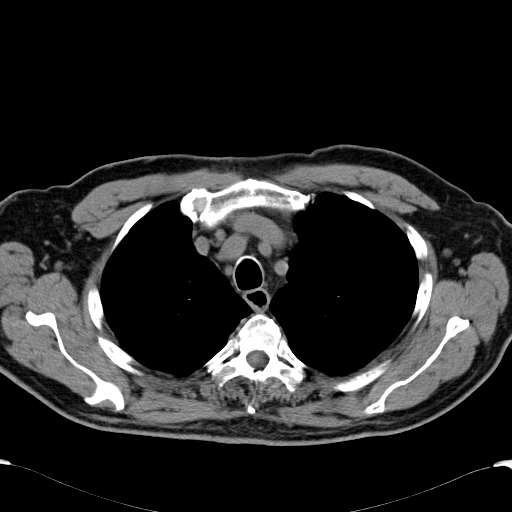
[im 58/72  lung]
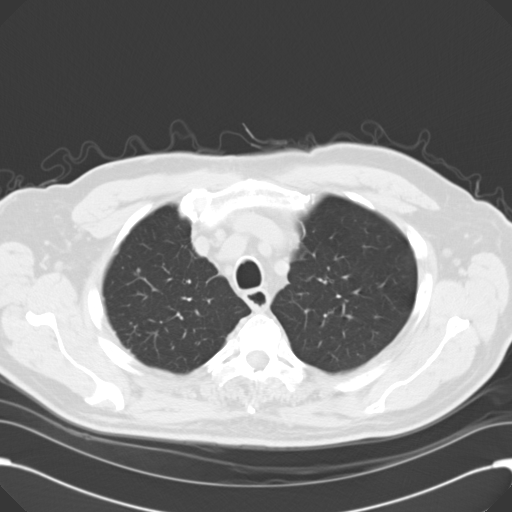
[im 64/72  lung]
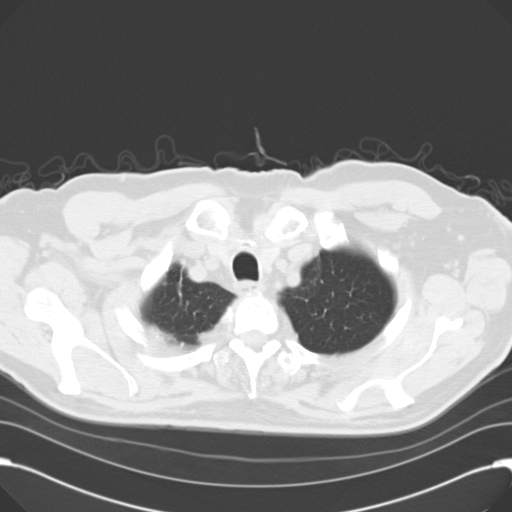
[im 69/72  lung]
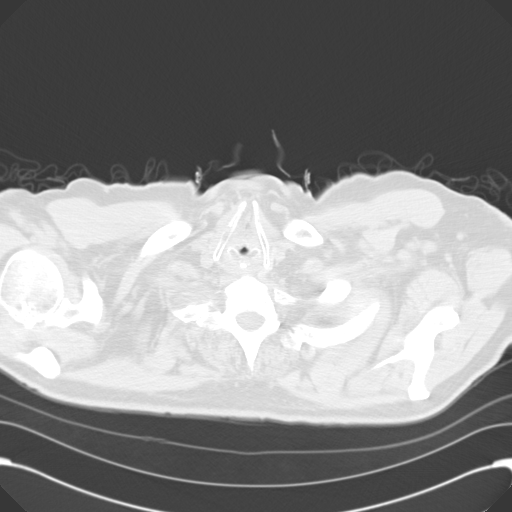

[15 of 31 positions shown; findings below may reference images not displayed]

FINDINGS: Mediastinum/Nodes: Stable 0.6 cm pretracheal node image 20. Moderate
atheromatous aortic and coronary arterial calcification. Heart size
is normal.

Thyroid grossly normal.  No pericardial effusion.

Lungs/Pleura: Right upper lobe curvilinear presumed scarring
reidentified. Multiple right upper lobe pulmonary parenchymal
nodules measuring 3 mm and smaller are essentially stable. Calcified
granulomas are noted elsewhere. Central airways are patent. No
pleural effusion.

Upper abdomen: Adrenal glands are normal. Atheromatous aortic
calcification without aneurysm.

Musculoskeletal: Mild multilevel disc degenerative change. No acute
osseous abnormality. Right humeral bone anchors.
IMPRESSION: Stable noncalcified right upper lobe predominant pulmonary nodules.
By size criteria these are most likely benign and most likely
indicate no further need for follow-up. Because the patient has a
history of chest wall sarcoma per previous report, consider CT
followup February 2016 and 1 year thereafter to document 2 year
stability and establish probable benignity.

## 2017-02-21 ENCOUNTER — Observation Stay (HOSPITAL_COMMUNITY)
Admission: EM | Admit: 2017-02-21 | Discharge: 2017-02-22 | Disposition: A | Discharge status: Home / Self Care | Payer: Medicare Other | Attending: Interventional Cardiology | Admitting: Interventional Cardiology

## 2017-02-21 ENCOUNTER — Emergency Department (HOSPITAL_COMMUNITY): Payer: Medicare Other

## 2017-02-21 ENCOUNTER — Telehealth: Payer: Self-pay

## 2017-02-21 ENCOUNTER — Encounter (HOSPITAL_COMMUNITY): Payer: Self-pay

## 2017-02-21 DIAGNOSIS — Z7982 Long term (current) use of aspirin: Secondary | ICD-10-CM | POA: Diagnosis not present

## 2017-02-21 DIAGNOSIS — I739 Peripheral vascular disease, unspecified: Secondary | ICD-10-CM

## 2017-02-21 DIAGNOSIS — I4891 Unspecified atrial fibrillation: Secondary | ICD-10-CM | POA: Diagnosis not present

## 2017-02-21 DIAGNOSIS — Z79899 Other long term (current) drug therapy: Secondary | ICD-10-CM | POA: Insufficient documentation

## 2017-02-21 DIAGNOSIS — I10 Essential (primary) hypertension: Secondary | ICD-10-CM | POA: Diagnosis not present

## 2017-02-21 DIAGNOSIS — Z87891 Personal history of nicotine dependence: Secondary | ICD-10-CM | POA: Diagnosis not present

## 2017-02-21 DIAGNOSIS — I481 Persistent atrial fibrillation: Secondary | ICD-10-CM | POA: Diagnosis not present

## 2017-02-21 DIAGNOSIS — E782 Mixed hyperlipidemia: Secondary | ICD-10-CM

## 2017-02-21 DIAGNOSIS — R079 Chest pain, unspecified: Secondary | ICD-10-CM

## 2017-02-21 DIAGNOSIS — I2510 Atherosclerotic heart disease of native coronary artery without angina pectoris: Secondary | ICD-10-CM | POA: Diagnosis not present

## 2017-02-21 HISTORY — DX: Unspecified malignant neoplasm of skin of other part of trunk: C44.509

## 2017-02-21 LAB — TSH: TSH: 1.889 u[IU]/mL (ref 0.350–4.500)

## 2017-02-21 LAB — CBC
HCT: 44.6 % (ref 39.0–52.0)
Hemoglobin: 15.4 g/dL (ref 13.0–17.0)
MCH: 31.6 pg (ref 26.0–34.0)
MCHC: 34.5 g/dL (ref 30.0–36.0)
MCV: 91.6 fL (ref 78.0–100.0)
PLATELETS: 242 10*3/uL (ref 150–400)
RBC: 4.87 MIL/uL (ref 4.22–5.81)
RDW: 13.4 % (ref 11.5–15.5)
WBC: 6 10*3/uL (ref 4.0–10.5)

## 2017-02-21 LAB — BASIC METABOLIC PANEL
Anion gap: 7 (ref 5–15)
BUN: 15 mg/dL (ref 6–20)
CO2: 29 mmol/L (ref 22–32)
CREATININE: 0.99 mg/dL (ref 0.61–1.24)
Calcium: 9.5 mg/dL (ref 8.9–10.3)
Chloride: 103 mmol/L (ref 101–111)
GFR calc Af Amer: >60 mL/min (ref >60)
GFR calc non Af Amer: >60 mL/min (ref >60)
Glucose, Bld: 80 mg/dL (ref 65–99)
POTASSIUM: 4.5 mmol/L (ref 3.5–5.1)
SODIUM: 139 mmol/L (ref 135–145)

## 2017-02-21 LAB — I-STAT TROPONIN, ED: Troponin i, poc: 0 ng/mL (ref 0.00–0.08)

## 2017-02-21 LAB — HEPARIN LEVEL (UNFRACTIONATED): Heparin Unfractionated: 0.58 IU/mL (ref 0.30–0.70)

## 2017-02-21 LAB — TROPONIN I

## 2017-02-21 LAB — MAGNESIUM: Magnesium: 2.2 mg/dL (ref 1.7–2.4)

## 2017-02-21 MED ORDER — HEPARIN BOLUS VIA INFUSION
4500.0000 [IU] | Freq: Once | INTRAVENOUS | Status: AC
  Administered 2017-02-21: 4500 [IU] via INTRAVENOUS
  Filled 2017-02-21: qty 4500

## 2017-02-21 MED ORDER — PRAVASTATIN SODIUM 40 MG PO TABS
40.0000 mg | ORAL_TABLET | Freq: Every day | ORAL | Status: DC
  Administered 2017-02-21 – 2017-02-22 (×2): 40 mg via ORAL
  Filled 2017-02-21 (×2): qty 1

## 2017-02-21 MED ORDER — ASPIRIN 81 MG PO TABS: 81.0000 mg | ORAL_TABLET | Freq: Every day | ORAL | Status: DC

## 2017-02-21 MED ORDER — ASPIRIN 81 MG PO CHEW
81.0000 mg | CHEWABLE_TABLET | Freq: Every day | ORAL | Status: DC
  Administered 2017-02-21 – 2017-02-22 (×2): 81 mg via ORAL
  Filled 2017-02-21 (×2): qty 1

## 2017-02-21 MED ORDER — METOPROLOL TARTRATE 12.5 MG HALF TABLET
12.5000 mg | ORAL_TABLET | Freq: Two times a day (BID) | ORAL | Status: DC
  Administered 2017-02-21 – 2017-02-22 (×2): 12.5 mg via ORAL
  Filled 2017-02-21 (×2): qty 1

## 2017-02-21 MED ORDER — ACETAMINOPHEN 325 MG PO TABS: 650.0000 mg | ORAL_TABLET | ORAL | Status: DC | PRN

## 2017-02-21 MED ORDER — HEPARIN (PORCINE) IN NACL 100-0.45 UNIT/ML-% IJ SOLN
1150.0000 [IU]/h | INTRAMUSCULAR | Status: DC
  Administered 2017-02-21 – 2017-02-22 (×2): 1200 [IU]/h via INTRAVENOUS
  Filled 2017-02-21 (×2): qty 250

## 2017-02-21 MED ORDER — ONDANSETRON HCL 4 MG/2ML IJ SOLN: 4.0000 mg | Freq: Four times a day (QID) | INTRAMUSCULAR | Status: DC | PRN

## 2017-02-21 NOTE — ED Notes (Signed)
Patient transported to X-ray 

## 2017-02-21 NOTE — Progress Notes (Signed)
ANTICOAGULATION CONSULT NOTE - Follow Up Consult  Pharmacy Consult for Heparin Indication: atrial fibrillation  Allergies  Allergen Reactions  . Lipitor [Atorvastatin Calcium] Other (See Comments)    Muscle pain  . Vantin [Cefpodoxime] Rash    Patient Measurements: Height: 6' (182.9 cm) Weight: 185 lb 14.4 oz (84.3 kg) IBW/kg (Calculated) : 77.6 Heparin Dosing Weight: 82 kg  Vital Signs: Temp: 97.9 F (36.6 C) (03/22 1958) Temp Source: Oral (03/22 1958) BP: 101/65 (03/22 2137) Pulse Rate: 79 (03/22 1958)  Labs:  Recent Labs  02/21/17 0946 02/21/17 1754 02/21/17 2151  HGB 15.4  --   --   HCT 44.6  --   --   PLT 242  --   --   HEPARINUNFRC  --   --  0.58  CREATININE 0.99  --   --   TROPONINI  --  <0.03  --     Estimated Creatinine Clearance: 59.9 mL/min (by C-G formula based on SCr of 0.99 mg/dL).   Medications:  Infusions:  . heparin 1,200 Units/hr (02/21/17 1408)    Assessment: 83 year old on IV heparin for new onset atrial fibrillation.  Heparin level therapeutic at 0.58 after initial bolus and rate.  No bleeding noted.   Goal of Therapy:  Heparin level 0.3-0.7 units/ml Monitor platelets by anticoagulation protocol: Yes   Plan:  Continue heparin at 1200 units/hr.  Recheck in 8 hours- ok to do with AM labs.  Daily heparin level and CBC while on therapy.   Sloan Leiter, PharmD, BCPS Clinical Pharmacist Clinical phone 02/21/2017 until 11 PM - [phone] After hours, please call #[phone] 02/21/2017,10:54 PM

## 2017-02-21 NOTE — ED Notes (Signed)
ED Provider at bedside. 

## 2017-02-21 NOTE — ED Notes (Signed)
Warm blanket given; no further needs. Cards to see.

## 2017-02-21 NOTE — Progress Notes (Signed)
ANTICOAGULATION CONSULT NOTE - Initial Consult  Pharmacy Consult for heparin Indication: atrial fibrillation  Allergies  Allergen Reactions  . Lipitor [Atorvastatin Calcium] Other (See Comments)    Muscle pain  . Vantin [Cefpodoxime] Rash    Patient Measurements: Height: 6' (182.9 cm) Weight: 181 lb (82.1 kg) IBW/kg (Calculated) : 77.6 Heparin Dosing Weight: 82kg  Vital Signs: Temp: 97.5 F (36.4 C) (03/22 1013) Temp Source: Oral (03/22 1013) BP: 128/73 (03/22 1245) Pulse Rate: 87 (03/22 1245)  Labs:  Recent Labs  02/21/17 0946  HGB 15.4  HCT 44.6  PLT 242  CREATININE 0.99    Estimated Creatinine Clearance: 59.9 mL/min (by C-G formula based on SCr of 0.99 mg/dL).   Medical History: Past Medical History:  Diagnosis Date  . Arthritis    neck   . Calcification of coronary artery   . Carotid artery occlusion   . Colon polyps   . Hyperlipidemia    takes Pravastatin every other day  . Hypertension   . Pneumonia    back in Feb 2014    Medications:  Infusions:  . heparin      Assessment: 18 yom presented to the ED with CP and elevated heart rate. Found to have new onset afib. To start IV heparin with plans to transition to apixaban eventually. Baseline CBC is WNL. He is not on anticoagulation PTA.   Goal of Therapy:  Heparin level 0.3-0.7 units/ml Monitor platelets by anticoagulation protocol: Yes   Plan:  Heparin bolus 4500 units IV x 1 Heparin gtt 1200 units/hr Check an 8 hr heparin level Daily heparin level and CBC  Davona Kinoshita, Rande Lawman 02/21/2017,1:14 PM

## 2017-02-21 NOTE — ED Notes (Signed)
Pt's visitor notified pt had hearing aids in ears to notify nurse, notified Elizabeth(RN)

## 2017-02-21 NOTE — ED Provider Notes (Signed)
[facility] DEPT Provider Note   CSN: [id number] Arrival date & time: 02/21/17  4742  By signing my name below, I, Higinio Plan, attest that this documentation has been prepared under the direction and in the presence of Veryl Speak, MD . Electronically Signed: Higinio Plan, Scribe. 02/21/2017. 10:29 AM.  History   Chief Complaint Chief Complaint  Patient presents with  . Chest Pain   The history is provided by the patient. No language interpreter was used.   HPI Comments: Sean Miranda is a 83 y.o. male with PMHx of carotid artery occlusion, cardiac catheterization in 2000, and HTN, who presents to the Emergency Department complaining of gradually improving, chest pain that began at ~12:30 AM this morning. Pt reports this episode of chest pain awoke him from sleep and lasted for 6-8 minutes before spontaneously resolving. He states he checked his blood pressure and heart rate soon after and noticed it was in the "100s" which is abnormal for him. He notes he checked his heart rate again ~4 hours later and it still read 105. Pt reports he visited his cardiologist, Dr. Debara Pickett, this morning for his symptoms and was told to visit the ED for further evaluation. He denies any chest pain now in the ED, cardiac hx, nausea, vomiting, shortness of breath, and leg swelling.  Patient's cardiologist is Dr. Debara Pickett.   Past Medical History:  Diagnosis Date  . Arthritis    neck   . Calcification of coronary artery   . Carotid artery occlusion   . Colon polyps   . Hyperlipidemia    takes Pravastatin every other day  . Hypertension   . Pneumonia    back in Feb 2014    Patient Active Problem List   Diagnosis Date Noted  . 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 Hemoptysis 02/04/2015  . Aftercare following surgery of the circulatory system, [place] 05/28/2014  . Essential hypertension 05/27/2014  . Exertional chest pain 05/27/2014  . Carotid stenosis 11/10/2013  . Occlusion and stenosis of  carotid artery without mention of cerebral infarction 11/06/2013  . 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 Edema 04/02/2013  . Coronary atherosclerosis 11/03/2010  . Carotid artery disease ([place]) 12/26/2009  . Hyperlipidemia 08/12/2008  . COLONIC POLYPS, HX OF 06/03/2007    Past Surgical History:  Procedure Laterality Date  . CARDIAC CATHETERIZATION  06/09/1999   mild CAD (LAD, diagonal, RCA) - Dr. Loni Muse. [facility]  . CAROTID DOPPLER  02/2012   60-79% RICA stenosis, 59-56% LICA stenosis (prior to carotid endarterectomy)  . CAROTID ENDARTERECTOMY Right 11-10-13   cea  . COLONOSCOPY    . ENDARTERECTOMY Right 11/10/2013   Procedure: ENDARTERECTOMY CAROTID;  Surgeon: Conrad Hidalgo, MD;  Location: [facility];  Service: Vascular;  Laterality: Right;  . INGUINAL HERNIA REPAIR    . NM MYOCAR PERF WALL MOTION  04/2011   bruce myoview - perfusion defect in inferior myocardium (diaphragmatic attenuation), remaining myocardium with normal perfusion, EF 67%  . PATCH ANGIOPLASTY Right 11/10/2013   Procedure: PATCH ANGIOPLASTY;  Surgeon: Conrad Creswell, MD;  Location: [facility];  Service: Vascular;  Laterality: Right;  . PILONIDAL CYST DRAINAGE    . ROTATOR CUFF REPAIR Bilateral 1998, 2001  . TONSILLECTOMY      Home Medications    Prior to Admission medications   Medication Sig Start Date End Date Taking? Authorizing Provider  ALPHA LIPOIC ACID PO Take 300 mg by mouth daily.    Historical Provider, MD  aspirin 81 MG  tablet Take 1 tablet (81 mg total) by mouth daily. 03/27/16   Marletta Lor, MD  Cholecalciferol (VITAMIN D3) 2000 UNITS capsule Take 2,000 Units by mouth daily. Take 2,000 Units by mouth daily.    Historical Provider, MD  Coenzyme Q10-Levocarnitine 100-20 MG CAPS Take 1 capsule by mouth daily. Take by mouth.    Historical Provider, MD  Glucosamine-Chondroitin 500-400 MG CAPS Take 1 capsule by mouth daily. Take by mouth.    Historical Provider, MD  hydrocortisone (ANUSOL-HC) 2.5 % rectal cream Place 1  application rectally 2 (two) times daily. 10/08/16   Lucretia Kern, DO  Magnesium 500 MG TABS Take 1 tablet by mouth daily.    Historical Provider, MD  Multiple Vitamin (MULTIVITAMIN) tablet Take 0.5 tablets by mouth daily.     Historical Provider, MD  OMEGA-3 1000 MG CAPS Take 4,000 mg by mouth daily.    Historical Provider, MD  pravastatin (PRAVACHOL) 40 MG tablet TAKE 1TABLET BY MOUTH AT BEDTIME. 12/21/16   Marletta Lor, MD  Probiotic Product (PROBIOTIC DAILY PO) Take 1 capsule by mouth daily.    Historical Provider, MD  sildenafil (REVATIO) 20 MG tablet TAKE 2-3 TABLETS DAILY AS NEEDED FOR ERECTILE DYSFUNCTION. 11/20/16   Marletta Lor, MD    Family History Family History  Problem Relation Age of Onset  . Stroke Father   . Hypertension Mother   . Prostate cancer Son   . Stroke Brother   . Cancer Brother     spine    Social History Social History  Substance Use Topics  . Smoking status: Former Smoker    Types: Cigarettes    Quit date: 12/03/1972  . Smokeless tobacco: Never Used  . Alcohol use 4.2 oz/week    7 Glasses of wine per week     Comment: wine nightly   Allergies   Lipitor [atorvastatin calcium] and Vantin [cefpodoxime]  Review of Systems Review of Systems  Respiratory: Negative for shortness of breath.   Cardiovascular: Positive for chest pain. Negative for palpitations and leg swelling.  Gastrointestinal: Negative for nausea and vomiting.   Physical Exam Updated Vital Signs BP 135/81 (BP Location: Right Arm)   Pulse 70   Temp 97.5 F (36.4 C) (Oral)   Resp 14   Ht 6' (1.829 m)   Wt 181 lb (82.1 kg)   SpO2 97%   BMI 24.55 kg/m   Physical Exam  Constitutional: He is oriented to person, place, and time. He appears well-developed and well-nourished.  HENT:  Head: Normocephalic and atraumatic.  Eyes: EOM are normal.  Neck: Normal range of motion.  Cardiovascular: Normal heart sounds and intact distal pulses.   Heart is irregularly irregular.    Pulmonary/Chest: Effort normal and breath sounds normal. No respiratory distress.  Abdominal: Soft. He exhibits no distension. There is no tenderness.  Musculoskeletal: Normal range of motion.  Neurological: He is alert and oriented to person, place, and time.  Skin: Skin is warm and dry.  Psychiatric: He has a normal mood and affect. Judgment normal.  Nursing note and vitals reviewed.  ED Treatments / Results  DIAGNOSTIC STUDIES:  Oxygen Saturation is 97% on RA, normal by my interpretation.    COORDINATION OF CARE:  10:19 AM Discussed treatment plan with pt at bedside and pt agreed to plan.  Labs (all labs ordered are listed, but only abnormal results are displayed) Labs Reviewed  [place], ED    EKG  EKG Interpretation None      Radiology No results found.  Procedures Procedures (including critical care time)  Medications Ordered in ED Medications - No data to display  Initial Impression / Assessment and Plan / ED Course  I have reviewed the triage vital signs and the nursing notes.  Pertinent labs & imaging results that were available during my care of the patient were reviewed by me and considered in my medical decision making (see chart for details).  Patient with no prior cardiac history presenting with chest pain and new onset atrial fibrillation. His pain had resolved prior to coming here, however his EKG reveals A. fib. Troponin is negative. I have consulted cardiology who will evaluate and admit the patient for further workup.  I personally performed the services described in this documentation, which was scribed in my presence. The recorded information has been reviewed and is accurate.      Final Clinical Impressions(s) / ED Diagnoses   Final diagnoses:  None    New Prescriptions New Prescriptions   No medications on file     Veryl Speak, MD 02/21/17 1601

## 2017-02-21 NOTE — Telephone Encounter (Signed)
Pt walked into clinic stating that he had chest pain last night but has been ok since he Woke up this morning. Pt states that his HR this morning was 105. Pt is not visually sweating, is not verbally SOB and denies any SOB, palpitations, no chest pain or pressure at this time. s/w [facility] nurse and she states that next time pt has CP to go to  the ER. pt declines appt tomorrow, pt states that he will go to the ER if he needs to but declines to schedule any appt at this time. Pt has not been seen here since 09-2014 informed pt that he is due for f/u appt, still declines appt.

## 2017-02-21 NOTE — H&P (Signed)
History & Physical    Patient ID: MIRKO TAILOR MRN: [id number], DOB/AGE: 05-Sep-1930   Admit date: 02/21/2017   Primary Physician: Nyoka Cowden, MD Primary Cardiologist: [facility]  Patient Profile    83 yo male with PMH of HL, Non obstructive CAD, carotid artery disease who presented with chest pain and elevated HR.   Past Medical History    Past Medical History:  Diagnosis Date  . Arthritis    neck   . Calcification of coronary artery   . Carotid artery occlusion   . Colon polyps   . Hyperlipidemia    takes Pravastatin every other day  . Hypertension   . Pneumonia    back in Feb 2014    Past Surgical History:  Procedure Laterality Date  . CARDIAC CATHETERIZATION  06/09/1999   mild CAD (LAD, diagonal, RCA) - Dr. Loni Muse. [facility]  . CAROTID DOPPLER  02/2012   60-79% RICA stenosis, 67-89% LICA stenosis (prior to carotid endarterectomy)  . CAROTID ENDARTERECTOMY Right 11-10-13   cea  . COLONOSCOPY    . ENDARTERECTOMY Right 11/10/2013   Procedure: ENDARTERECTOMY CAROTID;  Surgeon: Conrad [place] Fe, MD;  Location: [facility];  Service: Vascular;  Laterality: Right;  . INGUINAL HERNIA REPAIR    . NM MYOCAR PERF WALL MOTION  04/2011   bruce myoview - perfusion defect in inferior myocardium (diaphragmatic attenuation), remaining myocardium with normal perfusion, EF 67%  . PATCH ANGIOPLASTY Right 11/10/2013   Procedure: PATCH ANGIOPLASTY;  Surgeon: Conrad Woodstock, MD;  Location: [facility];  Service: Vascular;  Laterality: Right;  . PILONIDAL CYST DRAINAGE    . ROTATOR CUFF REPAIR Bilateral 1998, 2001  . TONSILLECTOMY       Allergies  Allergies  Allergen Reactions  . Lipitor [Atorvastatin Calcium] Other (See Comments)    Muscle pain  . Vantin [Cefpodoxime] Rash    History of Present Illness    Mr. Vacha is a 83 yo male with PMH of HL, non obstructive CAD, carotid artery disease.  He was previously followed by Dr. Rex Kras then transferred to Dr. Debara Pickett. Underwent cardiac  catheterization in 2009 with minimal disease. Had a nuclear stress test in 5/12 that was low risk and a 67% EF. Reports having a right CEA in 2014 by Dr. Bridgett Larsson. He was last seen in the office by Dr. Debara Pickett in 10/15. At this visit reported some intermittent chest tightness with exertion that is relieved by rest. Reported being intolerant to statins, tried both Lipitor and pravastatin developing myalgias. Had another stress test in 2015 after this visit that was normal. Reports he is very active at home, uses a treadmill and light weights on a regular basis. Does not normally have any anginal symptoms.   Reports last evening around midnight he was awaken with central chest pressure that lasted about 10 minutes and the resolved. Got up and checked his HR, noted in the 100s. Was able to layback down, then woke up around 630 and checked his HR again. Noted to still be in the 100s. Presented to the office with symptoms, and told to come to the ED.   In the ED EKG showed new onset Afib. Labs with stable electrolytes, trop neg x1, Hgb 15.4. CXR negative.   Home Medications    Prior to Admission medications   Medication Sig Start Date End Date Taking? Authorizing Provider  ALPHA LIPOIC ACID PO Take 300 mg by mouth daily.    Historical Provider, MD  aspirin 81 MG tablet Take  1 tablet (81 mg total) by mouth daily. 03/27/16   Marletta Lor, MD  Cholecalciferol (VITAMIN D3) 2000 UNITS capsule Take 2,000 Units by mouth daily. Take 2,000 Units by mouth daily.    Historical Provider, MD  Coenzyme Q10-Levocarnitine 100-20 MG CAPS Take 1 capsule by mouth daily. Take by mouth.    Historical Provider, MD  Glucosamine-Chondroitin 500-400 MG CAPS Take 1 capsule by mouth daily. Take by mouth.    Historical Provider, MD  hydrocortisone (ANUSOL-HC) 2.5 % rectal cream Place 1 application rectally 2 (two) times daily. 10/08/16   Lucretia Kern, DO  Magnesium 500 MG TABS Take 1 tablet by mouth daily.    Historical Provider, MD    Multiple Vitamin (MULTIVITAMIN) tablet Take 0.5 tablets by mouth daily.     Historical Provider, MD  OMEGA-3 1000 MG CAPS Take 4,000 mg by mouth daily.    Historical Provider, MD  pravastatin (PRAVACHOL) 40 MG tablet TAKE 1TABLET BY MOUTH AT BEDTIME. 12/21/16   Marletta Lor, MD  Probiotic Product (PROBIOTIC DAILY PO) Take 1 capsule by mouth daily.    Historical Provider, MD  sildenafil (REVATIO) 20 MG tablet TAKE 2-3 TABLETS DAILY AS NEEDED FOR ERECTILE DYSFUNCTION. 11/20/16   Marletta Lor, MD    Family History    Family History  Problem Relation Age of Onset  . Stroke Father   . Hypertension Mother   . Prostate cancer Son   . Stroke Brother   . Cancer Brother     spine    Social History    Social History   Social History  . Marital status: Married    Spouse name: N/A  . Number of children: 3  . Years of education: N/A   Occupational History  .  [place] History Main Topics  . Smoking status: Former Smoker    Types: Cigarettes    Quit date: 12/03/1972  . Smokeless tobacco: Never Used  . Alcohol use 4.2 oz/week    7 Glasses of wine per week     Comment: wine nightly  . Drug use: No  . Sexual activity: Not on file   Other Topics Concern  . Not on file   Social History Narrative  . No narrative on file     Review of Systems    General:  No chills, fever, night sweats or weight changes.  Cardiovascular: See HPI Dermatological: No rash, lesions/masses Respiratory: No cough, dyspnea Urologic: No hematuria, dysuria Abdominal:   No nausea, vomiting, diarrhea, bright red blood per rectum, melena, or hematemesis Neurologic:  No visual changes, wkns, changes in mental status. All other systems reviewed and are otherwise negative except as noted above.  Physical Exam    Blood pressure 120/71, pulse 84, temperature 97.5 F (36.4 C), temperature source Oral, resp. rate 15, height 6' (1.829 m), weight 181 lb (82.1 kg), SpO2 97 %.  General:  Pleasant older WM, NAD Psych: Normal affect. Neuro: Alert and oriented X 3. Moves all extremities spontaneously. HEENT: Normal  Neck: Supple without bruits or JVD. Lungs:  Resp regular and unlabored, CTA. Heart: Irreg Irreg no s3, s4, or murmurs. Abdomen: Soft, non-tender, non-distended, BS + x 4.  Extremities: No clubbing, cyanosis or edema. DP/PT/Radials 2+ and equal bilaterally.  Labs    Troponin [facility] Test)  Recent Labs  02/21/17 1039  TROPIPOC 0.00   No results for input(s): CKTOTAL, CKMB, TROPONINI in the last 72 hours. Lab Results  Component  Value Date   WBC 6.0 02/21/2017   HGB 15.4 02/21/2017   HCT 44.6 02/21/2017   MCV 91.6 02/21/2017   PLT 242 02/21/2017    Recent Labs Lab 02/21/17 0946  NA 139  K 4.5  CL 103  CO2 29  BUN 15  CREATININE 0.99  CALCIUM 9.5  GLUCOSE 80   Lab Results  Component Value Date   CHOL 229 (H) 12/14/2016   HDL 44.40 12/14/2016   LDLCALC 170 (H) 12/14/2016   TRIG 73.0 12/14/2016   No results found for: [facility]   Radiology Studies    Dg Chest 2 View  Result Date: 02/21/2017 CLINICAL DATA:  Chest pain. EXAM: CHEST  2 VIEW COMPARISON:  CT chest 08/09/2015 FINDINGS: The heart size and mediastinal contours are within normal limits. Both lungs are clear. The visualized skeletal structures are unremarkable. IMPRESSION: No active cardiopulmonary disease. Electronically Signed   By: Kathreen Devoid   On: 02/21/2017 10:39    ECG & Cardiac Imaging    EKG: Afib  Assessment & Plan    83 yo male with PMH of HL, Non obstructive CAD, carotid artery disease who presented with chest pain and elevated HR.  1. New onset Afib: Reports waking up with chest pressure last night, and checking his HR. Noted in the 100s. Checked it again this morning and was still elevated. Denies any palpitations, dizziness, lightheadedness or dyspnea. This patients CHA2DS2-VASc Score and unadjusted Ischemic Stroke Rate (% per year) is equal to 3.2 % stroke  rate/year from a score of 3 for Age, Carotid disease. Has a documented hx of HTN, but states he has never been on medications for this in the past.  -- start  metoprolol 25mg  BID -- discuss the need for [facility]. Will start IV heparin for now in case need for cardioversion, with plans to start Eliquis prior to discharge.  -- check TSH  2. Chest pain: Atypical in nature. EKG non acute, trop negative. Had a cath in '09 with nonobstructive CAD.  -- cycle enzymes -- check echo  3. HL: Reports being intolerant to statins, has been taking his pravastatin intermittently at home.  -- check lipids  4. Carotid artery disease: Right CEA, followed by Dr. Bridgett Larsson   Signed, Reino Bellis, NP-C Pager [phone] 02/21/2017, 12:26 PM   I have examined the patient and reviewed assessment and plan and discussed with patient.  Agree with above as stated.  I discussed AFib at length with them.  He had palpitations last night.  Would like to give a trial of rate slowing drugs before considering cardioversion.  Start metoprolol.  Hopefully, he converts to NSR on his own.  Start IV heparin just in the event that cardioversion is needed in hospital.  Start Eliquis at the time of discharge.  He has not had significant bleeding problems.  PAD and age are his RF for stroke.    Larae Grooms

## 2017-02-21 NOTE — ED Notes (Signed)
Pt eating lunch at this time

## 2017-02-21 NOTE — ED Notes (Signed)
Ambulated pt to restroom, no issues.

## 2017-02-21 NOTE — ED Notes (Signed)
Water given  

## 2017-02-21 NOTE — ED Triage Notes (Signed)
Per Pt, Pt was woken up at 1230 last night with a pressure in his center chest. Reports checking BP and HR. His Hr was elevated to 100. Pt took an ASA and went back to sleep. Pt reports waking up this morning with complaints of High HR, but the chest pain was decreased. Denies SOB.

## 2017-02-21 NOTE — Telephone Encounter (Signed)
Patient went to hospital

## 2017-02-21 NOTE — ED Notes (Signed)
Cards at bedside

## 2017-02-21 NOTE — ED Notes (Signed)
2 hearing aids in place in ER

## 2017-02-22 ENCOUNTER — Observation Stay (HOSPITAL_BASED_OUTPATIENT_CLINIC_OR_DEPARTMENT_OTHER): Payer: Medicare Other

## 2017-02-22 DIAGNOSIS — R079 Chest pain, unspecified: Secondary | ICD-10-CM | POA: Diagnosis not present

## 2017-02-22 DIAGNOSIS — E782 Mixed hyperlipidemia: Secondary | ICD-10-CM | POA: Diagnosis not present

## 2017-02-22 DIAGNOSIS — I2510 Atherosclerotic heart disease of native coronary artery without angina pectoris: Secondary | ICD-10-CM | POA: Diagnosis not present

## 2017-02-22 DIAGNOSIS — I481 Persistent atrial fibrillation: Secondary | ICD-10-CM | POA: Diagnosis not present

## 2017-02-22 DIAGNOSIS — I4891 Unspecified atrial fibrillation: Secondary | ICD-10-CM | POA: Diagnosis not present

## 2017-02-22 DIAGNOSIS — I739 Peripheral vascular disease, unspecified: Secondary | ICD-10-CM | POA: Diagnosis not present

## 2017-02-22 LAB — CBC
HEMATOCRIT: 41 % (ref 39.0–52.0)
Hemoglobin: 14.4 g/dL (ref 13.0–17.0)
MCH: 31.9 pg (ref 26.0–34.0)
MCHC: 35.1 g/dL (ref 30.0–36.0)
MCV: 90.7 fL (ref 78.0–100.0)
Platelets: 210 10*3/uL (ref 150–400)
RBC: 4.52 MIL/uL (ref 4.22–5.81)
RDW: 13.4 % (ref 11.5–15.5)
WBC: 5.9 10*3/uL (ref 4.0–10.5)

## 2017-02-22 LAB — BASIC METABOLIC PANEL
Anion gap: 8 (ref 5–15)
BUN: 15 mg/dL (ref 6–20)
CHLORIDE: 103 mmol/L (ref 101–111)
CO2: 25 mmol/L (ref 22–32)
Calcium: 8.8 mg/dL — ABNORMAL LOW (ref 8.9–10.3)
Creatinine, Ser: 1.07 mg/dL (ref 0.61–1.24)
GFR calc Af Amer: >60 mL/min (ref >60)
GFR calc non Af Amer: >60 mL/min (ref >60)
GLUCOSE: 100 mg/dL — AB (ref 65–99)
POTASSIUM: 3.9 mmol/L (ref 3.5–5.1)
SODIUM: 136 mmol/L (ref 135–145)

## 2017-02-22 LAB — LIPID PANEL
CHOLESTEROL: 194 mg/dL (ref 0–200)
HDL: 38 mg/dL — AB (ref >40)
LDL CALC: 143 mg/dL — AB (ref 0–99)
TRIGLYCERIDES: 63 mg/dL (ref <150)
Total CHOL/HDL Ratio: 5.1 RATIO
VLDL: 13 mg/dL (ref 0–40)

## 2017-02-22 LAB — ECHOCARDIOGRAM COMPLETE
Height: 72 in
WEIGHTICAEL: 2961.6 [oz_av]

## 2017-02-22 LAB — TROPONIN I: Troponin I: <0.03 ng/mL (ref <0.03)

## 2017-02-22 LAB — HEPARIN LEVEL (UNFRACTIONATED): HEPARIN UNFRACTIONATED: 0.72 [IU]/mL — AB (ref 0.30–0.70)

## 2017-02-22 MED ORDER — APIXABAN 5 MG PO TABS: 5.0000 mg | ORAL_TABLET | Freq: Two times a day (BID) | ORAL | 11 refills | Status: DC

## 2017-02-22 MED ORDER — APIXABAN 5 MG PO TABS
5.0000 mg | ORAL_TABLET | Freq: Two times a day (BID) | ORAL | Status: DC
  Administered 2017-02-22: 5 mg via ORAL
  Filled 2017-02-22: qty 1

## 2017-02-22 MED ORDER — METOPROLOL TARTRATE 25 MG PO TABS: 12.5000 mg | ORAL_TABLET | Freq: Two times a day (BID) | ORAL | 11 refills | Status: DC

## 2017-02-22 NOTE — Progress Notes (Signed)
Progress Note  Patient Name: Sean Miranda Date of Encounter: 02/22/2017  Primary Cardiologist: Dr. Debara Pickett  Subjective   No chest pain or palpitations  Inpatient Medications    Scheduled Meds: . aspirin  81 mg Oral Daily  . metoprolol tartrate  12.5 mg Oral BID  . pravastatin  40 mg Oral Daily   Continuous Infusions: . heparin 1,200 Units/hr (02/22/17 0536)   PRN Meds: acetaminophen, ondansetron (ZOFRAN) IV   Vital Signs    Vitals:   02/21/17 1658 02/21/17 1958 02/21/17 2137 02/22/17 0500  BP: 113/74 (!) 95/55 101/65 99/79  Pulse: 85 79  65  Resp: 16 18  15   Temp: 97.5 F (36.4 C) 97.9 F (36.6 C)  98.1 F (36.7 C)  TempSrc: Oral Oral  Oral  SpO2:  95%  95%  Weight:    185 lb 1.6 oz (84 kg)  Height:        Intake/Output Summary (Last 24 hours) at 02/22/17 0914 Last data filed at 02/22/17 0657  Gross per 24 hour  Intake            319.8 ml  Output              300 ml  Net             19.8 ml   Filed Weights   02/21/17 0941 02/21/17 1656 02/22/17 0500  Weight: 181 lb (82.1 kg) 185 lb 14.4 oz (84.3 kg) 185 lb 1.6 oz (84 kg)    Telemetry    AFib, rate controlled - Personally Reviewed  ECG    AFib IRBBB - Personally Reviewed  Physical Exam   GEN: No acute distress.   Neck: No JVD Cardiac: irregularly irregular, no murmurs, rubs, or gallops.  Respiratory: Clear to auscultation bilaterally. GI: Soft, nontender, non-distended  MS: No edema; No deformity. Neuro:  Nonfocal  Psych: Normal affect   Labs    Chemistry Recent Labs Lab 02/21/17 0946 02/22/17 0403  NA 139 136  K 4.5 3.9  CL 103 103  CO2 29 25  GLUCOSE 80 100*  BUN 15 15  CREATININE 0.99 1.07  CALCIUM 9.5 8.8*  GFRNONAA >60 >60  GFRAA >60 >60  ANIONGAP 7 8     Hematology Recent Labs Lab 02/21/17 0946 02/22/17 0403  WBC 6.0 5.9  RBC 4.87 4.52  HGB 15.4 14.4  HCT 44.6 41.0  MCV 91.6 90.7  MCH 31.6 31.9  MCHC 34.5 35.1  RDW 13.4 13.4  PLT 242 210    Cardiac  Enzymes Recent Labs Lab 02/21/17 1754 02/21/17 2151 02/22/17 0403  TROPONINI <0.03 <0.03 <0.03    Recent Labs Lab 02/21/17 1039  TROPIPOC 0.00     BNPNo results for input(s): BNP, PROBNP in the last 168 hours.   DDimer No results for input(s): DDIMER in the last 168 hours.   Radiology    Dg Chest 2 View  Result Date: 02/21/2017 CLINICAL DATA:  Chest pain. EXAM: CHEST  2 VIEW COMPARISON:  CT chest 08/09/2015 FINDINGS: The heart size and mediastinal contours are within normal limits. Both lungs are clear. The visualized skeletal structures are unremarkable. IMPRESSION: No active cardiopulmonary disease. Electronically Signed   By: Kathreen Devoid   On: 02/21/2017 10:39    Cardiac Studies   Echo: personally reviewed images (prelim- normal LVEF)  Patient Profile     83 y.o. male  with new onset AFib   Assessment & Plan    AFib: normal TSH.  Likely age related.  Plan for Eliquis for long term anticoagulation to prevent stroke. Await final echo report.  Stop heparin once ELiquis started.    Mild CAD in the past.  No angina.  No evidence of ACS.  Plan discharge later today.  Stop aspirin at discharge.    LDL above target.  He thinks this was from his 3 week trip to [place].  He will try to improve his diet and get it down.   Signed, Larae Grooms, MD  02/22/2017, 9:14 AM

## 2017-02-22 NOTE — Care Management Note (Signed)
Case Management Note  Patient Details  Name: Sean Miranda MRN: [id number] Date of Birth: 1930-01-30  Subjective/Objective:   Pt presented for pulmonary embolism. Plan will be to return home with wife. New Rx for Eliquis. Benefits check in process.                 Action/Plan: 30 day free card given to patient for Eliquis. [facility] has medication available. If pt does not get cost before d/c, he is aware that pharmacy will be able to give him a price. No further needs from CM at this time.   Expected Discharge Date:  02/22/17               Expected Discharge Plan:  Home/Self Care  In-House Referral:  NA  Discharge planning Services  CM Consult, Medication Assistance  Post Acute Care Choice:  NA Choice offered to:  NA  DME Arranged:  N/A DME Agency:  NA  HH Arranged:  NA HH Agency:  NA  Status of Service:  Completed, signed off  If discussed at [place] of Stay Meetings, dates discussed:    Additional Comments:  Bethena Roys, RN 02/22/2017, 12:58 PM

## 2017-02-22 NOTE — Discharge Summary (Signed)
Discharge Summary    Patient ID: Sean Miranda,  MRN: [id number], DOB/AGE: [birth year] 83 y.o.  Admit date: 02/21/2017 Discharge date: 02/22/2017  Primary Care Provider: Nyoka Cowden Primary Cardiologist: Dr. Debara Pickett  Discharge Diagnoses    Active Problems:   Atrial fibrillation [facility])  Atypical chest pain  HLD  Allergies Allergies  Allergen Reactions  . Lipitor [Atorvastatin Calcium] Other (See Comments)    Muscle pain  . Vantin [Cefpodoxime] Rash    Diagnostic Studies/Procedures    Echo 02/22/17 Study Conclusions  - Left ventricle: The cavity size was normal. Systolic function was   vigorous. The estimated ejection fraction was in the range of 65%   to 70%. Wall motion was normal; there were no regional wall   motion abnormalities. The study was not technically sufficient to   allow evaluation of LV diastolic dysfunction due to atrial   fibrillation. - Aortic valve: Trileaflet; normal thickness leaflets. There was   trivial regurgitation. Valve area (VTI): 1.86 cm^2. Valve area   (Vmax): 1.87 cm^2. Valve area (Vmean): 1.83 cm^2. - Aortic root: The aortic root was normal in size. - Mitral valve: There was no regurgitation. - Left atrium: The atrium was normal in size. - Right ventricle: Systolic function was normal. - Tricuspid valve: There was trivial regurgitation. - Pulmonic valve: There was no regurgitation. - Pulmonary arteries: Systolic pressure was within the normal   range. - Inferior vena cava: The vessel was normal in size. - Pericardium, extracardiac: There was no pericardial effusion.   History of Present Illness     83 yo male with PMH of HL, Non obstructive CAD, carotid artery disease who presented with chest pain and elevated HR 02/21/17 who found to have afib  and admitted.   He was previously followed by Dr. Rex Kras then transferred to Dr. Debara Pickett. Underwent cardiac catheterization in 2009 with minimal disease. Had a nuclear stress test  in 5/12 that was low risk and a 67% EF. Reports having a right CEA in 2014 by Dr. Bridgett Larsson. He was last seen in the office by Dr. Debara Pickett in 10/15. At this visit reported some intermittent chest tightness with exertion that is relieved by rest. Reported being intolerant to statins, tried both Lipitor and pravastatin developing myalgias. Had another stress test in 2015 after this visit that was normal. Reports he is very active at home, uses a treadmill and light weights on a regular basis. Does not normally have any anginal symptoms.   Reports he was awaken around 1am with central chest pressure that lasted about 10 minutes and the resolved. Got up and checked his HR, noted in the 100s. Was able to layback down, then woke up around 630 again  and checked his HR again. Noted to still be in the 100s. Presented to the office with symptoms, and told to come to the ED.   In the ED EKG showed new onset Afib. Labs with stable electrolytes, trop neg x1, Hgb 15.4. CXR negative.  Hospital Course     Consultants: None  1. New onset Afib:  - He was started on  metoprolol 12.5mg  BID and IV heparin. Rate improved on 60-70s. TSH normal. Echo showed normal LV function, no wm abnormality. Transitioned to Eliquis 5mg  BID. Follow up with Dr. Debara Pickett as outpatient. He will decide further plan of rate vs rhythm control.   2. Chest pain:  - Atypical in nature. EKG non acute, trop negative. Had a cath in '09 with nonobstructive CAD. Discontinued  ASA given need of anticoagulation.   3. HL:  - 02/22/2017: Cholesterol 194; HDL 38; LDL Cholesterol 143; Triglycerides 63; VLDL 13 He thinks this was from his 3 week trip to [place].  He will try to improve his diet and get it down. Reports being intolerant to statins, has been taking his pravastatin intermittently at home.   The patient has been seen by Dr. Irish Lack today and deemed ready for discharge home. All follow-up appointments have been scheduled. Discharge medications are  listed below.     Discharge Vitals Blood pressure 99/79, pulse 65, temperature 98.1 F (36.7 C), temperature source Oral, resp. rate 15, height 6' (1.829 m), weight 185 lb 1.6 oz (84 kg), SpO2 95 %.  Filed Weights   02/21/17 0941 02/21/17 1656 02/22/17 0500  Weight: 181 lb (82.1 kg) 185 lb 14.4 oz (84.3 kg) 185 lb 1.6 oz (84 kg)    Labs & Radiologic Studies     CBC  Recent Labs  02/21/17 0946 02/22/17 0403  WBC 6.0 5.9  HGB 15.4 14.4  HCT 44.6 41.0  MCV 91.6 90.7  PLT 242 035   Basic Metabolic Panel  Recent Labs  02/21/17 0946 02/21/17 1754 02/22/17 0403  NA 139  --  136  K 4.5  --  3.9  CL 103  --  103  CO2 29  --  25  GLUCOSE 80  --  100*  BUN 15  --  15  CREATININE 0.99  --  1.07  CALCIUM 9.5  --  8.8*  MG  --  2.2  --    Liver Function Tests No results for input(s): AST, ALT, ALKPHOS, BILITOT, PROT, ALBUMIN in the last 72 hours. No results for input(s): LIPASE, AMYLASE in the last 72 hours. Cardiac Enzymes  Recent Labs  02/21/17 1754 02/21/17 2151 02/22/17 0403  TROPONINI <0.03 <0.03 <0.03   BNP Invalid input(s): POCBNP D-Dimer No results for input(s): DDIMER in the last 72 hours. Hemoglobin A1C No results for input(s): HGBA1C in the last 72 hours. Fasting Lipid Panel  Recent Labs  02/22/17 0403  CHOL 194  HDL 38*  LDLCALC 143*  TRIG 63  CHOLHDL 5.1   Thyroid Function Tests  Recent Labs  02/21/17 1754  TSH 1.889    Dg Chest 2 View  Result Date: 02/21/2017 CLINICAL DATA:  Chest pain. EXAM: CHEST  2 VIEW COMPARISON:  CT chest 08/09/2015 FINDINGS: The heart size and mediastinal contours are within normal limits. Both lungs are clear. The visualized skeletal structures are unremarkable. IMPRESSION: No active cardiopulmonary disease. Electronically Signed   By: Kathreen Devoid   On: 02/21/2017 10:39    Disposition   Pt is being discharged home today in good condition.  Follow-up Plans & Appointments    Follow-up [place], [place]. Go on 03/08/2017.   Specialties:  Librarian, academic, Cardiology Why:  @11am  for post hospital (Dr. Lysbeth Penner PA) Contact information: [address] [place] [address] [phone]          Discharge Instructions    Diet - low sodium heart healthy    Complete by:  As directed    Increase activity slowly    Complete by:  As directed       Discharge Medications   Current Discharge Medication List    START taking these medications   Details  apixaban (ELIQUIS) 5 MG TABS tablet Take 1 tablet (5 mg total) by mouth 2 (two) times  daily. Qty: 60 tablet, Refills: 11    metoprolol tartrate (LOPRESSOR) 25 MG tablet Take 0.5 tablets (12.5 mg total) by mouth 2 (two) times daily. Qty: 30 tablet, Refills: 11      CONTINUE these medications which have NOT CHANGED   Details  ALPHA LIPOIC ACID PO Take 300 mg by mouth daily.    Cholecalciferol (VITAMIN D3) 2000 UNITS capsule Take 2,000 Units by mouth daily.     Coenzyme Q10-Levocarnitine 100-20 MG CAPS Take 1 capsule by mouth daily.     Glucosamine-Chondroitin 500-400 MG CAPS Take 1 capsule by mouth daily.     hydrocortisone (ANUSOL-HC) 2.5 % rectal cream Place 1 application rectally 2 (two) times daily. Qty: 30 g, Refills: 0    Magnesium 500 MG TABS Take 1 tablet by mouth daily.    Multiple Vitamin (MULTIVITAMIN) tablet Take 0.5 tablets by mouth daily.     OMEGA-3 1000 MG CAPS Take 4,000 mg by mouth daily.    pravastatin (PRAVACHOL) 40 MG tablet TAKE 1TABLET BY MOUTH AT BEDTIME. Qty: 90 tablet, Refills: 3    Probiotic Product (PROBIOTIC DAILY PO) Take 1 capsule by mouth daily.    sildenafil (REVATIO) 20 MG tablet TAKE 2-3 TABLETS DAILY AS NEEDED FOR ERECTILE DYSFUNCTION. Qty: 60 tablet, Refills: 4      STOP taking these medications     aspirin 81 MG tablet           Outstanding Labs/Studies   None  Duration of Discharge Encounter   Greater than 30 minutes including physician  time.  Signed, Bhagat,Bhavinkumar PA-C 02/22/2017, 12:23 PM  AFib: normal TSH.  Likely age related.  Plan for Eliquis for long term anticoagulation to prevent stroke. Await final echo report.  Stop heparin once ELiquis started.    Mild CAD in the past.  No angina.  No evidence of ACS.  Discharge later today.  Stop aspirin at discharge.    LDL above target.  He thinks this was from his 3 week trip to [place].  He will try to improve his diet and get it down. F/u with Dr. Debara Pickett.

## 2017-02-22 NOTE — Care Management Obs Status (Signed)
[place] [place] NOTIFICATION   Patient Details  Name: Sean Miranda MRN: [id number] Date of Birth: Dec 17, 1929   Medicare Observation Status Notification Given:  Yes    Bethena Roys, RN 02/22/2017, 1:01 PM

## 2017-02-22 NOTE — Progress Notes (Addendum)
ANTICOAGULATION CONSULT NOTE - Follow Up Consult  Pharmacy Consult for Heparin Indication: atrial fibrillation  Allergies  Allergen Reactions  . Lipitor [Atorvastatin Calcium] Other (See Comments)    Muscle pain  . Vantin [Cefpodoxime] Rash    Patient Measurements: Height: 6' (182.9 cm) Weight: 185 lb 1.6 oz (84 kg) IBW/kg (Calculated) : 77.6 Heparin Dosing Weight: 82 kg  Vital Signs: Temp: 98.1 F (36.7 C) (03/23 0500) Temp Source: Oral (03/23 0500) BP: 99/79 (03/23 0500) Pulse Rate: 65 (03/23 0500)  Labs:  Recent Labs  02/21/17 0946 02/21/17 1754 02/21/17 2151 02/22/17 0403  HGB 15.4  --   --  14.4  HCT 44.6  --   --  41.0  PLT 242  --   --  210  HEPARINUNFRC  --   --  0.58 0.72*  CREATININE 0.99  --   --  1.07  TROPONINI  --  <0.03 <0.03 <0.03    Estimated Creatinine Clearance: 55.4 mL/min (by C-G formula based on SCr of 1.07 mg/dL).   Medications:  Infusions:  . heparin 1,200 Units/hr (02/22/17 0536)    Assessment: 83 year old on IV heparin for new-onset atrial fibrillation.  Heparin level slightly supratherapeutic at 0.72 this AM. CBC wnl stable. No bleeding or IV line issues per RN. Per MD notes, likely to start apixaban prior to d/c.  Goal of Therapy:  Heparin level 0.3-0.7 units/ml Monitor platelets by anticoagulation protocol: Yes   Plan:  Decrease heparin slightly to 1150 units/hr.  8h heparin level Daily heparin level and CBC Monitor for s/sx bleeding F/u long-term anticoagulation plans  Elicia Lamp, PharmD, BCPS Clinical Pharmacist 02/22/2017 8:31 AM   ADDENDUM:  Pharmacy consulted to transition from heparin IV to apixaban for afib. SCr 1.07, wt >60 kg. Communicated with RN to turn off heparin drip at time of 1st dose of apixaban.  Plan: D/c IV heparin at time of 1st dose of apixaban Apixaban 5mg  PO BID Monitor CBC, s/sx bleeding  Elicia Lamp, PharmD, BCPS Clinical Pharmacist 02/22/2017 9:20 AM

## 2017-02-22 NOTE — Discharge Instructions (Addendum)

## 2017-03-07 NOTE — Progress Notes (Signed)
Cardiology Office Note    Date:  03/08/2017   ID:  Sean Miranda, DOB 1929/12/13, MRN [id number]  PCP:  Nyoka Cowden, MD  Cardiologist: Dr. Debara Pickett   Chief Complaint  Patient presents with  . Follow-up    Hospital follow-up.     History of Present Illness:    Sean Miranda is a 83 y.o. male with past medical history of CAD (mild, nonobstructive CAD by cath in 2009), carotid artery disease (s/p R CEA in 2014), and HLD who presents to the office today for hospital follow-up.   He was recently admitted from 3/22 - 02/22/2017 for evaluation of chest pain. Reported developing pain which awoke him from sleep the evening prior to his presentation. While in the ED, he was found to be in new-onset atrial fibrillation with a HR in the 110's. Cyclic troponin values remained negative with TSH and his electrolytes being within normal limits. An echocardiogram was performed and showed a preserved EF of 65-70% with no regional WMA and no significant valve abnormalities. He was transitioned from IV Heparin to Eliquis 5mg  BID and placed on Lopressor 12.5mg  BID for rate-control.   In talking with the patient today, he reports doing well since his recent hospitalization. He denies any recurrent chest discomfort or palpitations. He has noticed continued dyspnea on exertion which occurs when walking for extended periods of time or exercising on the treadmill.  He reports good compliance with his medication regimen including Eliquis and Lopressor. He denies any missed doses of his Eliquis. Denies any evidence of active bleeding, including melena, hematochezia, or hematuria.  Past Medical History:  Diagnosis Date  . Arthritis    neck   . Atrial fibrillation ([place])    a. new-onset in 01/2017. Started on Eliquis  . Calcification of coronary artery    a. mild, nonobstructive CAD by cath in 2009.  Marland Kitchen Cancer of skin of chest   . Carotid artery occlusion    a. s/p R CEA in 2014  . Colon polyps   .  Hyperlipidemia    takes Pravastatin every other day  . Pneumonia ~ 01/2013    Past Surgical History:  Procedure Laterality Date  . CARDIAC CATHETERIZATION  06/09/1999   mild CAD (LAD, diagonal, RCA) - Dr. Loni Muse. [place]  . CAROTID DOPPLER  02/2012   60-79% RICA stenosis, 83-38% LICA stenosis (prior to carotid endarterectomy)  . COLONOSCOPY    . ENDARTERECTOMY Right 11/10/2013   Procedure: ENDARTERECTOMY CAROTID;  Surgeon: Conrad Tigerton, MD;  Location: [facility];  Service: Vascular;  Laterality: Right;  . INGUINAL HERNIA REPAIR Left   . MOLE REMOVAL  1990s   "chest"  . NM MYOCAR PERF WALL MOTION  04/2011   bruce myoview - perfusion defect in inferior myocardium (diaphragmatic attenuation), remaining myocardium with normal perfusion, EF 67%  . PATCH ANGIOPLASTY Right 11/10/2013   Procedure: PATCH ANGIOPLASTY;  Surgeon: Conrad [place], MD;  Location: [facility];  Service: Vascular;  Laterality: Right;  . PILONIDAL CYST DRAINAGE    . SHOULDER OPEN ROTATOR CUFF REPAIR Bilateral 1998 - 2001  . SKIN CANCER EXCISION  ~ 2015   "chest"  . TONSILLECTOMY      Current Medications: Outpatient Medications Prior to Visit  Medication Sig Dispense Refill  . ALPHA LIPOIC ACID PO Take 300 mg by mouth daily.    Marland Kitchen apixaban (ELIQUIS) 5 MG TABS tablet Take 1 tablet (5 mg total) by mouth 2 (two) times daily. 60 tablet 11  .  Cholecalciferol (VITAMIN D3) 2000 UNITS capsule Take 2,000 Units by mouth daily.     . Coenzyme Q10-Levocarnitine 100-20 MG CAPS Take 1 capsule by mouth daily.     . Glucosamine-Chondroitin 500-400 MG CAPS Take 1 capsule by mouth daily.     . hydrocortisone (ANUSOL-HC) 2.5 % rectal cream Place 1 application rectally 2 (two) times daily. (Patient taking differently: Place 1 application rectally 2 (two) times daily as needed for itching. ) 30 g 0  . Magnesium 500 MG TABS Take 1 tablet by mouth daily.    . metoprolol tartrate (LOPRESSOR) 25 MG tablet Take 0.5 tablets (12.5 mg total) by mouth 2 (two) times  daily. 30 tablet 11  . Multiple Vitamin (MULTIVITAMIN) tablet Take 0.5 tablets by mouth daily.     . OMEGA-3 1000 MG CAPS Take 4,000 mg by mouth daily.    . pravastatin (PRAVACHOL) 40 MG tablet TAKE 1TABLET BY MOUTH AT BEDTIME. (Patient taking differently: Take 20 mg by mouth every Monday, Wednesday, and Friday. ) 90 tablet 3  . Probiotic Product (PROBIOTIC DAILY PO) Take 1 capsule by mouth daily.    . sildenafil (REVATIO) 20 MG tablet TAKE 2-3 TABLETS DAILY AS NEEDED FOR ERECTILE DYSFUNCTION. 60 tablet 4   No facility-administered medications prior to visit.      Allergies:   Lipitor [atorvastatin calcium] and Vantin [cefpodoxime]   Social History   Social History  . Marital status: Married    Spouse name: N/A  . Number of children: 3  . Years of education: N/A   Occupational History  .  [place] History Main Topics  . Smoking status: Former Smoker    Packs/day: 1.00    Years: 18.00    Types: Cigarettes    Quit date: 12/03/1972  . Smokeless tobacco: Never Used  . Alcohol use 8.4 oz/week    7 Glasses of wine, 7 Shots of liquor per week  . Drug use: No  . Sexual activity: Yes   Other Topics Concern  . None   Social History Narrative  . None     Family History:  The patient's family history includes Cancer in his brother; Hypertension in his mother; Prostate cancer in his son; Stroke in his brother and father.   Review of Systems:   Please see the history of present illness.     General:  No chills, fever, night sweats or weight changes.  Cardiovascular:  No chest pain, edema, orthopnea, palpitations, paroxysmal nocturnal dyspnea. Positive for dyspnea on exertion.  Dermatological: No rash, lesions/masses Respiratory: No cough, dyspnea Urologic: No hematuria, dysuria Abdominal:   No nausea, vomiting, diarrhea, bright red blood per rectum, melena, or hematemesis Neurologic:  No visual changes, wkns, changes in mental status. All other systems reviewed and  are otherwise negative except as noted above.   Physical Exam:    VS:  BP 120/72   Pulse 68   Ht 6' (1.829 m)   Wt 195 lb (88.5 kg)   BMI 26.45 kg/m    General: Well developed, well nourished Caucasian male appearing in no acute distress. Head: Normocephalic, atraumatic, sclera non-icteric, no xanthomas, nares are without discharge.  Neck: No carotid bruits. JVD not elevated.  Lungs: Respirations regular and unlabored, without wheezes or rales.  Heart: Irregularly irregular. No S3 or S4.  No murmur, no rubs, or gallops appreciated. Abdomen: Soft, non-tender, non-distended with normoactive bowel sounds. No hepatomegaly. No rebound/guarding. No obvious abdominal masses. Msk:  Strength and tone appear  normal for age. No joint deformities or effusions. Extremities: No clubbing or cyanosis. No lower extremity edema.  Distal pedal pulses are 2+ bilaterally. Neuro: Alert and oriented X 3. Moves all extremities spontaneously. No focal deficits noted. Psych:  Responds to questions appropriately with a normal affect. Skin: No rashes or lesions noted  Wt Readings from Last 3 Encounters:  03/08/17 195 lb (88.5 kg)  02/22/17 185 lb 1.6 oz (84 kg)  01/04/17 191 lb 6.4 oz (86.8 kg)     Studies/Labs Reviewed:   EKG:  EKG is ordered today.  The ekg ordered today demonstrates atrial fibrillation, HR 69, with no acute ST or T-wave changes.   Recent Labs: 12/14/2016: ALT 12 02/21/2017: Magnesium 2.2; TSH 1.889 02/22/2017: BUN 15; Creatinine, Ser 1.07; Hemoglobin 14.4; Platelets 210; Potassium 3.9; Sodium 136   Lipid Panel    Component Value Date/Time   CHOL 194 02/22/2017 0403   TRIG 63 02/22/2017 0403   HDL 38 (L) 02/22/2017 0403   CHOLHDL 5.1 02/22/2017 0403   VLDL 13 02/22/2017 0403   LDLCALC 143 (H) 02/22/2017 0403   LDLDIRECT 149.3 01/05/2014 0940    Additional studies/ records that were reviewed today include:   Echocardiogram: 02/22/2017 Study Conclusions  - Left ventricle:  The cavity size was normal. Systolic function was   vigorous. The estimated ejection fraction was in the range of 65%   to 70%. Wall motion was normal; there were no regional wall   motion abnormalities. The study was not technically sufficient to   allow evaluation of LV diastolic dysfunction due to atrial   fibrillation. - Aortic valve: Trileaflet; normal thickness leaflets. There was   trivial regurgitation. Valve area (VTI): 1.86 cm^2. Valve area   (Vmax): 1.87 cm^2. Valve area (Vmean): 1.83 cm^2. - Aortic root: The aortic root was normal in size. - Mitral valve: There was no regurgitation. - Left atrium: The atrium was normal in size. - Right ventricle: Systolic function was normal. - Tricuspid valve: There was trivial regurgitation. - Pulmonic valve: There was no regurgitation. - Pulmonary arteries: Systolic pressure was within the normal   range. - Inferior vena cava: The vessel was normal in size. - Pericardium, extracardiac: There was no pericardial effusion.  Assessment:    1. Persistent atrial fibrillation ([place])   2. Chronic anticoagulation   3. Pre-procedure lab exam   4. Atherosclerosis of native coronary artery of native heart without angina pectoris   5. Carotid artery disease, unspecified laterality ([place])   6. Pure hypercholesterolemia      Plan:   In order of problems listed above:  1. Persistent Atrial Fibrillation/ Chronic Anticoagulation - recently admitted from 3/22 - 02/22/2017 for evaluation of chest pain and found to be in new-onset atrial fibrillation. Started on Eliquis 5mg  BID and Lopressor 12.5mg  BID. Echo showed a preserved EF of 65-70% with no regional WMA and no significant valve abnormalities.  - he denies any recurrent chest pain or palpitations but is still experiencing dyspnea with exertion since symptom onset.  - This patients CHA2DS2-VASc Score and unadjusted Ischemic Stroke Rate (% per year) is equal to 4.8 % stroke rate/year from a score of  4 (HTN, CAD, Age (2)). He denies any evidence of active bleeding. Continue Eliquis 5mg  BID for anticoagulation.  - He would benefit with restoration of NSR in the setting of his continued dyspnea. Will complete 4 weeks of anticoagulation and plan for DCCV on 03/25/2017. Discussed with Dr. Ellyn Hack (DOD). Will arrange for Dr. Debara Pickett to  perform. Risks and benefits reviewed with the patient and he agrees to proceed.   2. CAD - mild, nonobstructive CAD by cath in 2009. - he denies any recent anginal symptoms.  - continue statin and BB therapy. No ASA secondary to need for Eliquis.  3. Carotid Artery Disease - s/p R CEA in 2014 - carotid dopplers from 01/2017 showing patent R CEA with LICA stenosis < 76%.  4. HLD - followed by PCP.  - continue statin therapy.   Medication Adjustments/Labs and Tests Ordered: Current medicines are reviewed at length with the patient today.  Concerns regarding medicines are outlined above.  Medication changes, Labs and Tests ordered today are listed in the Patient Instructions below. Patient Instructions  Medication Instructions:  Your physician recommends that you continue on your current medications as directed. Please refer to the Current Medication list given to you today.  Labwork: NONE  Testing/Procedures: Your physician has recommended that you have a Cardioversion (DCCV) with DR. HILTY the AM of 4/23. This will be done at [facility]. Electrical Cardioversion uses a jolt of electricity to your heart either through paddles or wired patches attached to your chest. This is a controlled, usually prescheduled, procedure. Defibrillation is done under light anesthesia in the hospital, and you usually go home the day of the procedure. This is done to get your heart back into a normal rhythm. You are not awake for the procedure. Please see the instruction sheet given to you today.   Please take ALL medications the morning of the procedure.  Follow-Up: Your  physician recommends that you schedule a follow-up appointment in:  3-4 weeks after Cardioversion with Dr. Debara Pickett.   Any Other Special Instructions Will Be Listed Below (If Applicable).  If you need a refill on your cardiac medications before your next appointment, please call your pharmacy.   Signed, Erma Heritage, PA-C  03/08/2017 7:39 PM    [facility] [place], [place], [place]  [address] Phone: [phone]; Fax: [phone]  [address], [place], [place] [address] Phone: [phone]

## 2017-03-08 ENCOUNTER — Encounter: Payer: Self-pay | Admitting: Internal Medicine

## 2017-03-08 ENCOUNTER — Encounter: Payer: Self-pay | Admitting: Student

## 2017-03-08 ENCOUNTER — Ambulatory Visit (INDEPENDENT_AMBULATORY_CARE_PROVIDER_SITE_OTHER): Payer: Medicare Other | Admitting: Student

## 2017-03-08 VITALS — BP 120/72 | HR 68 | Ht 72.0 in | Wt 195.0 lb

## 2017-03-08 DIAGNOSIS — I2510 Atherosclerotic heart disease of native coronary artery without angina pectoris: Secondary | ICD-10-CM | POA: Diagnosis not present

## 2017-03-08 DIAGNOSIS — Z01812 Encounter for preprocedural laboratory examination: Secondary | ICD-10-CM | POA: Diagnosis not present

## 2017-03-08 DIAGNOSIS — I779 Disorder of arteries and arterioles, unspecified: Secondary | ICD-10-CM

## 2017-03-08 DIAGNOSIS — I4819 Other persistent atrial fibrillation: Secondary | ICD-10-CM

## 2017-03-08 DIAGNOSIS — E7800 Pure hypercholesterolemia, unspecified: Secondary | ICD-10-CM | POA: Diagnosis not present

## 2017-03-08 DIAGNOSIS — Z7901 Long term (current) use of anticoagulants: Secondary | ICD-10-CM | POA: Diagnosis not present

## 2017-03-08 DIAGNOSIS — I739 Peripheral vascular disease, unspecified: Secondary | ICD-10-CM

## 2017-03-08 DIAGNOSIS — I481 Persistent atrial fibrillation: Secondary | ICD-10-CM

## 2017-03-08 NOTE — Patient Instructions (Signed)
Medication Instructions:  Your physician recommends that you continue on your current medications as directed. Please refer to the Current Medication list given to you today.  Labwork: NONE  Testing/Procedures: Your physician has recommended that you have a Cardioversion (DCCV) with DR. HILTY the AM of 4/23. This will be done at [facility]. Electrical Cardioversion uses a jolt of electricity to your heart either through paddles or wired patches attached to your chest. This is a controlled, usually prescheduled, procedure. Defibrillation is done under light anesthesia in the hospital, and you usually go home the day of the procedure. This is done to get your heart back into a normal rhythm. You are not awake for the procedure. Please see the instruction sheet given to you today.   Please take ALL medications the morning of the procedure.  Follow-Up: Your physician recommends that you schedule a follow-up appointment in:  3-4 weeks after Cardioversion with Dr. Debara Pickett.    Any Other Special Instructions Will Be Listed Below (If Applicable).     Electrical Cardioversion Electrical cardioversion is the delivery of a jolt of electricity to restore a normal rhythm to the heart. A rhythm that is too fast or is not regular keeps the heart from pumping well. In this procedure, sticky patches or metal paddles are placed on the chest to deliver electricity to the heart from a device. This procedure may be done in an emergency if:  There is low or no blood pressure as a result of the heart rhythm.  Normal rhythm must be restored as fast as possible to protect the brain and heart from further damage.  It may save a life. This procedure may also be done for irregular or fast heart rhythms that are not immediately life-threatening. Tell a health care provider about:  Any allergies you have.  All medicines you are taking, including vitamins, herbs, eye drops, creams, and over-the-counter  medicines.  Any problems you or family members have had with anesthetic medicines.  Any blood disorders you have.  Any surgeries you have had.  Any medical conditions you have.  Whether you are pregnant or may be pregnant. What are the risks? Generally, this is a safe procedure. However, problems may occur, including:  Allergic reactions to medicines.  A blood clot that breaks free and travels to other parts of your body.  The possible return of an abnormal heart rhythm within hours or days after the procedure.  Your heart stopping (cardiac arrest). This is rare. What happens before the procedure? Medicines   Your health care provider may have you start taking:  Blood-thinning medicines (anticoagulants) so your blood does not clot as easily.  Medicines may be given to help stabilize your heart rate and rhythm.  Ask your health care provider about changing or stopping your regular medicines. This is especially important if you are taking diabetes medicines or blood thinners. General instructions   Plan to have someone take you home from the hospital or clinic.  If you will be going home right after the procedure, plan to have someone with you for 24 hours.  Follow instructions from your health care provider about eating or drinking restrictions. What happens during the procedure?  To lower your risk of infection:  Your health care team will wash or sanitize their hands.  Your skin will be washed with soap.  An IV tube will be inserted into one of your veins.  You will be given a medicine to help you relax (sedative).  Sticky patches (electrodes) or metal paddles may be placed on your chest.  An electrical shock will be delivered. The procedure may vary among health care providers and hospitals. What happens after the procedure?  Your blood pressure, heart rate, breathing rate, and blood oxygen level will be monitored until the medicines you were given have worn  off.  Do not drive for 24 hours if you were given a sedative.  Your heart rhythm will be watched to make sure it does not change. This information is not intended to replace advice given to you by your health care provider. Make sure you discuss any questions you have with your health care provider. Document Released: 11/09/2002 Document Revised: 07/18/2016 Document Reviewed: 05/25/2016 Elsevier Interactive Patient Education  2017 [employer].        If you need a refill on your cardiac medications before your next appointment, please call your pharmacy.

## 2017-03-11 ENCOUNTER — Ambulatory Visit: Payer: Medicare Other | Admitting: Internal Medicine

## 2017-03-25 ENCOUNTER — Ambulatory Visit (HOSPITAL_COMMUNITY): Payer: Medicare Other | Admitting: Anesthesiology

## 2017-03-25 ENCOUNTER — Other Ambulatory Visit: Payer: Self-pay | Admitting: *Deleted

## 2017-03-25 ENCOUNTER — Encounter (HOSPITAL_COMMUNITY): Payer: Self-pay | Admitting: Internal Medicine

## 2017-03-25 ENCOUNTER — Encounter (HOSPITAL_COMMUNITY)
Admission: RE | Disposition: A | Discharge status: Home / Self Care | Payer: Self-pay | Source: Ambulatory Visit | Attending: Internal Medicine

## 2017-03-25 ENCOUNTER — Ambulatory Visit (HOSPITAL_COMMUNITY)
Admission: RE | Admit: 2017-03-25 | Discharge: 2017-03-25 | Disposition: A | Discharge status: Home / Self Care | Payer: Medicare Other | Source: Ambulatory Visit | Attending: Internal Medicine | Admitting: Internal Medicine

## 2017-03-25 DIAGNOSIS — R042 Hemoptysis: Secondary | ICD-10-CM | POA: Diagnosis not present

## 2017-03-25 DIAGNOSIS — R079 Chest pain, unspecified: Secondary | ICD-10-CM

## 2017-03-25 DIAGNOSIS — E785 Hyperlipidemia, unspecified: Secondary | ICD-10-CM | POA: Diagnosis not present

## 2017-03-25 DIAGNOSIS — I4891 Unspecified atrial fibrillation: Secondary | ICD-10-CM | POA: Diagnosis not present

## 2017-03-25 DIAGNOSIS — I739 Peripheral vascular disease, unspecified: Secondary | ICD-10-CM | POA: Diagnosis not present

## 2017-03-25 DIAGNOSIS — I2510 Atherosclerotic heart disease of native coronary artery without angina pectoris: Secondary | ICD-10-CM | POA: Insufficient documentation

## 2017-03-25 DIAGNOSIS — Z87891 Personal history of nicotine dependence: Secondary | ICD-10-CM | POA: Diagnosis not present

## 2017-03-25 DIAGNOSIS — I481 Persistent atrial fibrillation: Secondary | ICD-10-CM

## 2017-03-25 DIAGNOSIS — M1990 Unspecified osteoarthritis, unspecified site: Secondary | ICD-10-CM | POA: Insufficient documentation

## 2017-03-25 DIAGNOSIS — I10 Essential (primary) hypertension: Secondary | ICD-10-CM | POA: Diagnosis not present

## 2017-03-25 HISTORY — PX: CARDIOVERSION: SHX1299

## 2017-03-25 LAB — BASIC METABOLIC PANEL
ANION GAP: 7 (ref 5–15)
BUN: 13 mg/dL (ref 6–20)
CHLORIDE: 105 mmol/L (ref 101–111)
CO2: 26 mmol/L (ref 22–32)
Calcium: 9.2 mg/dL (ref 8.9–10.3)
Creatinine, Ser: 0.97 mg/dL (ref 0.61–1.24)
GFR calc non Af Amer: >60 mL/min (ref >60)
GLUCOSE: 96 mg/dL (ref 65–99)
Potassium: 4.2 mmol/L (ref 3.5–5.1)
Sodium: 138 mmol/L (ref 135–145)

## 2017-03-25 SURGERY — CARDIOVERSION
Anesthesia: General

## 2017-03-25 MED ORDER — LIDOCAINE HCL (CARDIAC) 20 MG/ML IV SOLN
INTRAVENOUS | Status: DC | PRN
  Administered 2017-03-25: 40 mg via INTRATRACHEAL

## 2017-03-25 MED ORDER — APIXABAN 5 MG PO TABS
5.0000 mg | ORAL_TABLET | Freq: Once | ORAL | Status: DC
  Filled 2017-03-25: qty 1

## 2017-03-25 MED ORDER — PROPOFOL 10 MG/ML IV BOLUS
INTRAVENOUS | Status: DC | PRN
  Administered 2017-03-25: 77 mg via INTRAVENOUS

## 2017-03-25 MED ORDER — SODIUM CHLORIDE 0.9 % IV SOLN
INTRAVENOUS | Status: DC | PRN
  Administered 2017-03-25: 08:00:00 via INTRAVENOUS

## 2017-03-25 NOTE — Transfer of Care (Signed)
Immediate Anesthesia Transfer of Care Note  Patient: Sean Miranda  Procedure(s) Performed: Procedure(s): CARDIOVERSION (N/A)  Patient Location: Endoscopy Unit  Anesthesia Type:General  Level of Consciousness: awake, alert  and oriented  Airway & Oxygen Therapy: Patient Spontanous Breathing and Patient connected to nasal cannula oxygen  Post-op Assessment: Report given to RN, Post -op Vital signs reviewed and stable and Patient able to stick tongue midline  Post vital signs: Reviewed and stable  Last Vitals:  Vitals:   03/25/17 0817 03/25/17 0818  BP:  135/82  Pulse: 82 96  Resp: 17 14  Temp:      Last Pain:  Vitals:   03/25/17 0715  TempSrc: Oral         Complications: No apparent anesthesia complications

## 2017-03-25 NOTE — Anesthesia Postprocedure Evaluation (Signed)
Anesthesia Post Note  Patient: Sean Miranda  Procedure(s) Performed: Procedure(s) (LRB): CARDIOVERSION (N/A)  Patient location during evaluation: PACU Anesthesia Type: General Level of consciousness: awake and alert Pain management: pain level controlled Vital Signs Assessment: post-procedure vital signs reviewed and stable Respiratory status: spontaneous breathing, nonlabored ventilation, respiratory function stable and patient connected to nasal cannula oxygen Cardiovascular status: blood pressure returned to baseline and stable Postop Assessment: no signs of nausea or vomiting Anesthetic complications: no       Last Vitals:  Vitals:   03/25/17 0900 03/25/17 0910  BP: 135/62 135/74  Pulse: (!) 51 97  Resp: 10 13  Temp:      Last Pain:  Vitals:   03/25/17 0820  TempSrc: Oral                 Hilde Churchman,JAMES TERRILL

## 2017-03-25 NOTE — Discharge Instructions (Signed)
Electrical Cardioversion, Care After °This sheet gives you information about how to care for yourself after your procedure. Your health care provider may also give you more specific instructions. If you have problems or questions, contact your health care provider. °What can I expect after the procedure? °After the procedure, it is common to have: °· Some redness on the skin where the shocks were given. °Follow these instructions at home: °· Do not drive for 24 hours if you were given a medicine to help you relax (sedative). °· Take over-the-counter and prescription medicines only as told by your health care provider. °· Ask your health care provider how to check your pulse. Check it often. °· Rest for 48 hours after the procedure or as told by your health care provider. °· Avoid or limit your caffeine use as told by your health care provider. °Contact a health care provider if: °· You feel like your heart is beating too quickly or your pulse is not regular. °· You have a serious muscle cramp that does not go away. °Get help right away if: °· You have discomfort in your chest. °· You are dizzy or you feel faint. °· You have trouble breathing or you are short of breath. °· Your speech is slurred. °· You have trouble moving an arm or leg on one side of your body. °· Your fingers or toes turn cold or blue. °This information is not intended to replace advice given to you by your health care provider. Make sure you discuss any questions you have with your health care provider. °Document Released: 09/09/2013 Document Revised: 06/22/2016 Document Reviewed: 05/25/2016 °Elsevier Interactive Patient Education © 2017 Elsevier Inc. ° °

## 2017-03-25 NOTE — CV Procedure (Signed)
    CARDIOVERSION NOTE  Procedure: Electrical Cardioversion Indications:  Atrial Fibrillation  Procedure Details:  Consent: Risks of procedure as well as the alternatives and risks of each were explained to the (patient/caregiver).  Consent for procedure obtained.  Time Out: Verified patient identification, verified procedure, site/side was marked, verified correct patient position, special equipment/implants available, medications/allergies/relevent history reviewed, required imaging and test results available.  Performed  Patient placed on cardiac monitor, pulse oximetry, supplemental oxygen as necessary.  Sedation given: Propofol per anesthesia Pacer pads placed anterior and posterior chest.  Cardioverted 3 time(s).  Cardioverted at 150J, 200J x 2 biphasic.  Impression: Findings: Post procedure EKG shows: Atrial Fibrillation Complications: None Patient did tolerate procedure well.  Plan: 1. Unsuccessful DCCV after 3 stacked shocks. Will likely need to pursue antiarrythmic therapy as he is symptomatic (fatigued) with his a-fib.  Time Spent Directly with the Patient:  30 minutes   Pixie Casino, MD, [facility] Attending Cardiologist [place] 03/25/2017, 8:17 AM

## 2017-03-25 NOTE — H&P (Signed)
    INTERVAL PROCEDURE H&P  History and Physical Interval Note:  03/25/2017 8:04 AM  Sean Miranda has presented today for their planned procedure. The various methods of treatment have been discussed with the patient and family. After consideration of risks, benefits and other options for treatment, the patient has consented to the procedure.  The patients' outpatient history has been reviewed, patient examined, and no change in status from most recent office note within the past 30 days. I have reviewed the patients' chart and labs and will proceed as planned. Questions were answered to the patient's satisfaction.   Pixie Casino, MD, [facility] Attending Cardiologist [facility] 03/25/2017, 8:04 AM

## 2017-03-25 NOTE — Anesthesia Preprocedure Evaluation (Addendum)
Anesthesia Evaluation  Patient identified by MRN, date of birth, ID band  Reviewed: Allergy & Precautions, NPO status   Airway Mallampati: II   Neck ROM: Full    Dental no notable dental hx. (+) Dental Advidsory Given   Pulmonary former smoker,    breath sounds clear to auscultation       Cardiovascular hypertension, + CAD and + Peripheral Vascular Disease  + dysrhythmias Atrial Fibrillation  Rhythm:Irregular Rate:Normal     Neuro/Psych    GI/Hepatic   Endo/Other    Renal/GU      Musculoskeletal  (+) Arthritis ,   Abdominal   Peds  Hematology   Anesthesia Other Findings   Reproductive/Obstetrics                            Anesthesia Physical Anesthesia Plan  ASA: III  Anesthesia Plan: General   Post-op Pain Management:    Induction: Intravenous  Airway Management Planned: Natural Airway  Additional Equipment:   Intra-op Plan:   Post-operative Plan:   Informed Consent: I have reviewed the patients History and Physical, chart, labs and discussed the procedure including the risks, benefits and alternatives for the proposed anesthesia with the patient or authorized representative who has indicated his/her understanding and acceptance.   Dental Advisory Given  Plan Discussed with: CRNA  Anesthesia Plan Comments:        Anesthesia Quick Evaluation

## 2017-03-29 ENCOUNTER — Telehealth (HOSPITAL_COMMUNITY): Payer: Self-pay

## 2017-03-29 NOTE — Telephone Encounter (Signed)
Encounter complete. 

## 2017-04-02 ENCOUNTER — Telehealth (HOSPITAL_COMMUNITY): Payer: Self-pay

## 2017-04-02 NOTE — Telephone Encounter (Signed)
Encounter complete. 

## 2017-04-03 ENCOUNTER — Ambulatory Visit (HOSPITAL_COMMUNITY)
Admission: RE | Admit: 2017-04-03 | Discharge: 2017-04-03 | Disposition: A | Discharge status: Home / Self Care | Payer: Medicare Other | Source: Ambulatory Visit | Attending: Cardiology | Admitting: Cardiology

## 2017-04-03 DIAGNOSIS — Z87891 Personal history of nicotine dependence: Secondary | ICD-10-CM | POA: Insufficient documentation

## 2017-04-03 DIAGNOSIS — R079 Chest pain, unspecified: Secondary | ICD-10-CM | POA: Diagnosis not present

## 2017-04-03 DIAGNOSIS — I10 Essential (primary) hypertension: Secondary | ICD-10-CM | POA: Diagnosis not present

## 2017-04-03 DIAGNOSIS — R0609 Other forms of dyspnea: Secondary | ICD-10-CM | POA: Diagnosis not present

## 2017-04-03 DIAGNOSIS — I2510 Atherosclerotic heart disease of native coronary artery without angina pectoris: Secondary | ICD-10-CM | POA: Insufficient documentation

## 2017-04-03 DIAGNOSIS — I4891 Unspecified atrial fibrillation: Secondary | ICD-10-CM | POA: Insufficient documentation

## 2017-04-03 LAB — MYOCARDIAL PERFUSION IMAGING
CHL CUP NUCLEAR SDS: 0
CHL CUP NUCLEAR SRS: 4
CSEPPHR: 90 {beats}/min
NUC STRESS TID: 1.3
Rest HR: 74 {beats}/min
SSS: 4

## 2017-04-03 MED ORDER — TECHNETIUM TC 99M TETROFOSMIN IV KIT
10.6000 | PACK | Freq: Once | INTRAVENOUS | Status: AC | PRN
  Administered 2017-04-03: 10.6 via INTRAVENOUS
  Filled 2017-04-03: qty 11

## 2017-04-03 MED ORDER — TECHNETIUM TC 99M TETROFOSMIN IV KIT
30.5000 | PACK | Freq: Once | INTRAVENOUS | Status: AC | PRN
  Administered 2017-04-03: 30.5 via INTRAVENOUS
  Filled 2017-04-03: qty 31

## 2017-04-03 MED ORDER — REGADENOSON 0.4 MG/5ML IV SOLN
0.4000 mg | Freq: Once | INTRAVENOUS | Status: AC
  Administered 2017-04-03: 0.4 mg via INTRAVENOUS

## 2017-04-16 ENCOUNTER — Ambulatory Visit (INDEPENDENT_AMBULATORY_CARE_PROVIDER_SITE_OTHER): Payer: Medicare Other | Admitting: Internal Medicine

## 2017-04-16 ENCOUNTER — Encounter: Payer: Self-pay | Admitting: Internal Medicine

## 2017-04-16 VITALS — BP 126/72 | HR 72 | Ht 70.0 in | Wt 190.0 lb

## 2017-04-16 DIAGNOSIS — D689 Coagulation defect, unspecified: Secondary | ICD-10-CM | POA: Diagnosis not present

## 2017-04-16 DIAGNOSIS — R5383 Other fatigue: Secondary | ICD-10-CM

## 2017-04-16 DIAGNOSIS — Z01812 Encounter for preprocedural laboratory examination: Secondary | ICD-10-CM | POA: Diagnosis not present

## 2017-04-16 DIAGNOSIS — I481 Persistent atrial fibrillation: Secondary | ICD-10-CM | POA: Diagnosis not present

## 2017-04-16 DIAGNOSIS — I4819 Other persistent atrial fibrillation: Secondary | ICD-10-CM

## 2017-04-16 MED ORDER — FLECAINIDE ACETATE 50 MG PO TABS: 75.0000 mg | ORAL_TABLET | Freq: Two times a day (BID) | ORAL | 1 refills | Status: DC

## 2017-04-16 NOTE — Patient Instructions (Addendum)
Please be very diligent about taking Eliquis. You need THREE WEEKS of uninterrupted therapy of this medication in anticipation of another cardioversion.   Your physician has recommended you make the following change in your medication:  -- START flecainide 75mg  twice daily  Please call to schedule an EKG check 1 week after you start on this medication.   Your physician has recommended that you have a Cardioversion (DCCV) - mid June (after 15th) w/Dr. Debara Pickett. Electrical Cardioversion uses a jolt of electricity to your heart either through paddles or wired patches attached to your chest. This is a controlled, usually prescheduled, procedure. Defibrillation is done under light anesthesia in the hospital, and you usually go home the day of the procedure. This is done to get your heart back into a normal rhythm. You are not awake for the procedure. Please see the instruction sheet given to you today. -- you will need to have blood work done prior to this appointment   Your physician recommends that you schedule a follow-up appointment 3 weeks after your cardioversion

## 2017-04-16 NOTE — Progress Notes (Signed)
OFFICE NOTE  Chief Complaint:  Hospital follow-up, fatigued  Primary Care Physician: Marletta Lor, MD  HPI:  Sean Miranda is a pleasant 83 yo male, previously followed by Dr., Rex Kras, who has calcification of his coronary arteries documented by catheterization in 2009, but no occlusive disease. He had a nuclear study May 2012 that was a low risk and a 67% ejection fraction. He has known carotid disease, had Doppler studies done through [employer] that shows less than 80% on the right and less than 60% on the left, which is an increase from his Doppler study done a year previously, and he is currently supposedly scheduled for repeat study in 6 months. He has had no episodes of angina, no unusual shortness of breath, no awareness of any arrhythmias. He has had no ankle edema. He denies dizziness, lightheadedness, unilateral weakness, slurred speech or blurred vision. He checks his blood pressure regularly at home. It has been under good control. He has no unusual shortness of breath and has had no changes in exercise tolerance or stamina. He had had significant myalgias from Lipitor in the past, and I am a little concerned he may develop recurrent myalgias from the daily dose of Pravachol but I see no reason we cannot try this in view of his lipids which now show an LDL of 142 with an HDL of 51.  Unfortunately he was intolerant of Pravachol and other statins, is not currently taking them. He recently had repeat carotid Dopplers which showed high grade velocities in the right carotid and the upper end of the range of 79%. His carotids are followed that with our imaging and Dr. Leanne Chang is coordinating that.  Sean Miranda returns today and is doing fairly well. He does report some shortness of breath with exertion and some chest tightness which improves after exercise.  The chest tightness is fairly predictable with exercise and relieved by rest. This is concerning for possible  angina. He reports he discontinued his pravastatin about one year ago due to problems with leg cramps. He's also had side effects with Lipitor. Recent cholesterol profile showed total cholesterol 222, triglycerides 82, HDL 43 and LDL 162.  He's also had progression of his right internal carotid artery stenosis and underwent right carotid endarterectomy for greater than 80% stenosis of that artery. He does have moderate stenosis of the left internal carotid.  Sean Miranda was referred for a stress test after his last visit for exertional chest pain. Stress test was negative for ischemia. He returns today without complaints. He is due for a repeat doppler in 3-4 months.  04/16/2017  Sean Miranda returns today for follow-up. He is in persistent A. fib and failed recent cardioversion. Stress testing demonstrated no new ischemia. LVEF was normal on echo. We discussed options including antiarrhythmic therapy since he is symptomatic with his A. fib and he is agreeable to start on therapy. EKG today shows atrial fibrillation at 72 with a QTC of 440 ms. He is on metoprolol tartrate 12.5 mg twice a day.  PMHx:  Past Medical History:  Diagnosis Date  . Arthritis    neck   . Atrial fibrillation ([place])    a. new-onset in 01/2017. Started on Eliquis  . Calcification of coronary artery    a. mild, nonobstructive CAD by cath in 2009.  Marland Kitchen Cancer of skin of chest   . Carotid artery occlusion    a. s/p R CEA in 2014  . Colon polyps   .  Hyperlipidemia    takes Pravastatin every other day  . Pneumonia ~ 01/2013    Past Surgical History:  Procedure Laterality Date  . CARDIAC CATHETERIZATION  06/09/1999   mild CAD (LAD, diagonal, RCA) - Dr. Loni Muse. [place]  . CARDIOVERSION N/A 03/25/2017   Procedure: CARDIOVERSION;  Surgeon: Pixie Casino, MD;  Location: [facility] ENDOSCOPY;  Service: Cardiovascular;  Laterality: N/A;  . CAROTID DOPPLER  02/2012   60-79% RICA stenosis, 63-87% LICA stenosis (prior to carotid endarterectomy)    . COLONOSCOPY    . ENDARTERECTOMY Right 11/10/2013   Procedure: ENDARTERECTOMY CAROTID;  Surgeon: Conrad Stevenson, MD;  Location: [place];  Service: Vascular;  Laterality: Right;  . INGUINAL HERNIA REPAIR Left   . MOLE REMOVAL  1990s   "chest"  . NM MYOCAR PERF WALL MOTION  04/2011   bruce myoview - perfusion defect in inferior myocardium (diaphragmatic attenuation), remaining myocardium with normal perfusion, EF 67%  . PATCH ANGIOPLASTY Right 11/10/2013   Procedure: PATCH ANGIOPLASTY;  Surgeon: Conrad Rose Hill, MD;  Location: [place];  Service: Vascular;  Laterality: Right;  . PILONIDAL CYST DRAINAGE    . SHOULDER OPEN ROTATOR CUFF REPAIR Bilateral 1998 - 2001  . SKIN CANCER EXCISION  ~ 2015   "chest"  . TONSILLECTOMY      FAMHx:  Family History  Problem Relation Age of Onset  . Stroke Father   . Hypertension Mother   . Prostate cancer Son   . Stroke Brother   . Cancer Brother        spine    SOCHx:   reports that he quit smoking about 44 years ago. His smoking use included Cigarettes. He has a 18.00 pack-year smoking history. He has never used smokeless tobacco. He reports that he drinks about 8.4 oz of alcohol per week . He reports that he does not use drugs.  ALLERGIES:  Allergies  Allergen Reactions  . Lipitor [Atorvastatin Calcium] Other (See Comments)    Muscle pain  . Vantin [Cefpodoxime] Rash    ROS: Pertinent items noted in HPI and remainder of comprehensive ROS otherwise negative.  HOME MEDS: Current Outpatient Prescriptions  Medication Sig Dispense Refill  . ALPHA LIPOIC ACID PO Take 300 mg by mouth daily.    Marland Kitchen apixaban (ELIQUIS) 5 MG TABS tablet Take 1 tablet (5 mg total) by mouth 2 (two) times daily. 60 tablet 11  . Cholecalciferol (VITAMIN D3) 2000 UNITS capsule Take 2,000 Units by mouth daily.     . Coenzyme Q10-Levocarnitine 100-20 MG CAPS Take 1 capsule by mouth daily.     . Glucosamine-Chondroitin 500-400 MG CAPS Take 1 capsule by mouth daily.     .  hydrocortisone (ANUSOL-HC) 2.5 % rectal cream Place 1 application rectally 2 (two) times daily. (Patient taking differently: Place 1 application rectally 2 (two) times daily as needed for itching. ) 30 g 0  . Magnesium 500 MG TABS Take 1 tablet by mouth daily.    . metoprolol tartrate (LOPRESSOR) 25 MG tablet Take 0.5 tablets (12.5 mg total) by mouth 2 (two) times daily. 30 tablet 11  . Multiple Vitamin (MULTIVITAMIN) tablet Take 0.5 tablets by mouth daily.     . OMEGA-3 1000 MG CAPS Take 4,000 mg by mouth daily.    . pravastatin (PRAVACHOL) 40 MG tablet TAKE 1TABLET BY MOUTH AT BEDTIME. (Patient taking differently: Take 20 mg by mouth every Monday, Wednesday, and Friday. ) 90 tablet 3  . Probiotic Product (PROBIOTIC DAILY PO) Take 1  capsule by mouth daily.    . sildenafil (REVATIO) 20 MG tablet TAKE 2-3 TABLETS DAILY AS NEEDED FOR ERECTILE DYSFUNCTION. 60 tablet 4  . flecainide (TAMBOCOR) 50 MG tablet Take 1.5 tablets (75 mg total) by mouth 2 (two) times daily. 270 tablet 1   No current facility-administered medications for this visit.     LABS/IMAGING: No results found for this or any previous visit (from the past 48 hour(s)). No results found.  VITALS: BP 126/72   Pulse 72   Ht 5\' 10"  (1.778 m)   Wt 190 lb (86.2 kg)   BMI 27.26 kg/m   EXAM: General appearance: alert and no distress Lungs: clear to auscultation bilaterally Heart: irregularly irregular rhythm Extremities: extremities normal, atraumatic, no cyanosis or edema Neurologic: Grossly normal  EKG: Atrial fibrillation at 72  ASSESSMENT: 1. Symptomatic persistent a-fib - negative nuclear stress test (04/2017), normal EF on echo 01/2017 2. CHADSVASC score of 4 - on Eliquis (he missed 1 dose 4 days ago) 3. History of calcified coronaries with no obstructive disease 4. Moderate left internal carotid artery stenosis, s/p right CEA 5. Dyslipidemia - tolerating pravastatin 6. Hypertension-controlled  PLAN: 1.   Sean Miranda  had a negative nuclear stress test and recent echo with normal LV systolic function and no structural heart disease. He remains in A. fib despite 3 attempts at cardioversion. He will likely need antiarrhythmic therapy. We discussed and agreed on starting flecainide 75 mg twice a day. He'll need a repeat EKG in 1 week after starting the medication. Plan for at least 3 weeks of uninterrupted anticoagulation. I strongly urged him to be very compliant with the medications as it could likely lead to cancellation of his cardioversion if he misses additional doses. We'll likely repeat a cardioversion attempt in mid June.  Pixie Casino, MD, [place]  Attending Cardiologist  Direct Dial: [phone]  Fax: [phone]  Website:  [email or web address] 04/16/2017, 10:42 AM

## 2017-05-02 ENCOUNTER — Ambulatory Visit (INDEPENDENT_AMBULATORY_CARE_PROVIDER_SITE_OTHER): Payer: Medicare Other | Admitting: *Deleted

## 2017-05-02 ENCOUNTER — Telehealth: Payer: Self-pay | Admitting: Internal Medicine

## 2017-05-02 VITALS — BP 128/60 | HR 69

## 2017-05-02 DIAGNOSIS — I481 Persistent atrial fibrillation: Secondary | ICD-10-CM

## 2017-05-02 DIAGNOSIS — I4819 Other persistent atrial fibrillation: Secondary | ICD-10-CM

## 2017-05-02 MED ORDER — FLECAINIDE ACETATE 50 MG PO TABS: 50.0000 mg | ORAL_TABLET | Freq: Two times a day (BID) | ORAL | 1 refills | Status: DC

## 2017-05-02 NOTE — Telephone Encounter (Signed)
Patient stated that he has been on Flecainide 75 mg bid and needs to have a follow up EKG. A nurse visit appointment has been made for today 5/31 at 2pm per patient request.

## 2017-05-02 NOTE — Telephone Encounter (Signed)
New Message    Pt c/o medication issue:  1. Name of Medication: flecainide (TAMBOCOR) 50 MG tablet  2. How are you currently taking this medication (dosage and times per day)? 50mg   3. Are you having a reaction (difficulty breathing--STAT)? no  4. What is your medication issue? Pt states he was instructed to call back once he has been on the medication for at least a week because testing may be needed. He requests a call back.

## 2017-05-02 NOTE — Progress Notes (Signed)
1.) Reason for visit: EKG ONLY  2.) Name of MD requesting visit: DR HILTY  3.) H&P: N/A  4.) ROS related to problem:  NO COMPLAINTS AT PRESENT, TAKING ALL MEDICAITONS  5.) Assessment and plan per MD:EKG OBTAINED. REVIEWED AND SIGNED BY DR [id number] AND DR HILTY. PER DR CROITORU,  DECREASE FLECAINIDE TO 50 MG TWICE A DAY. COME BACK IN 1 WEEK FOR EKG.  PATIENT AWARE. VERBALIZED UNDERSTANDING.

## 2017-05-02 NOTE — Patient Instructions (Addendum)
Medication changes  decrease flecainide 50 mg twice a day  Your physician recommends that you schedule a follow-up appointment in 1 week with nurse visit for EKG

## 2017-05-04 ENCOUNTER — Telehealth: Payer: Self-pay | Admitting: Cardiology

## 2017-05-04 NOTE — Telephone Encounter (Signed)
Mr Dimichele called saying his HR was in the 40's. He is asymptomatic. He had an EKG 05/02/17 and was told to decrease his Flecainide to 50 mg BID (I don't know what that EKG showed).  I suggested he take his Metoprolol 12.5 mg once a day. He is to have another EKG this week. He knows to call back or go to the ED if his HR gets any slower or he develops symptoms.  Kerin Ransom PA-C 05/04/2017 9:44 AM

## 2017-05-06 DIAGNOSIS — H40023 Open angle with borderline findings, high risk, bilateral: Secondary | ICD-10-CM | POA: Diagnosis not present

## 2017-05-06 DIAGNOSIS — H25813 Combined forms of age-related cataract, bilateral: Secondary | ICD-10-CM | POA: Diagnosis not present

## 2017-05-09 ENCOUNTER — Encounter: Payer: Self-pay | Admitting: Family Medicine

## 2017-05-09 ENCOUNTER — Telehealth: Payer: Self-pay

## 2017-05-09 ENCOUNTER — Ambulatory Visit (INDEPENDENT_AMBULATORY_CARE_PROVIDER_SITE_OTHER): Payer: Medicare Other | Admitting: *Deleted

## 2017-05-09 ENCOUNTER — Ambulatory Visit (INDEPENDENT_AMBULATORY_CARE_PROVIDER_SITE_OTHER): Payer: Medicare Other | Admitting: Family Medicine

## 2017-05-09 VITALS — BP 100/60 | HR 65 | Temp 97.9°F | Ht 70.0 in | Wt 193.0 lb

## 2017-05-09 VITALS — BP 130/56 | HR 62

## 2017-05-09 DIAGNOSIS — S70362A Insect bite (nonvenomous), left thigh, initial encounter: Secondary | ICD-10-CM | POA: Diagnosis not present

## 2017-05-09 DIAGNOSIS — L03116 Cellulitis of left lower limb: Secondary | ICD-10-CM

## 2017-05-09 DIAGNOSIS — W57XXXA Bitten or stung by nonvenomous insect and other nonvenomous arthropods, initial encounter: Secondary | ICD-10-CM

## 2017-05-09 DIAGNOSIS — Z79899 Other long term (current) drug therapy: Secondary | ICD-10-CM

## 2017-05-09 DIAGNOSIS — Z5181 Encounter for therapeutic drug level monitoring: Secondary | ICD-10-CM

## 2017-05-09 MED ORDER — DOXYCYCLINE HYCLATE 100 MG PO TABS: 100.0000 mg | ORAL_TABLET | Freq: Two times a day (BID) | ORAL | 0 refills | Status: DC

## 2017-05-09 MED ORDER — AMOXICILLIN 500 MG PO CAPS: 500.0000 mg | ORAL_CAPSULE | Freq: Two times a day (BID) | ORAL | 0 refills | Status: DC

## 2017-05-09 NOTE — Progress Notes (Signed)
HPI:  Acute visit for tick bite: -reports removed small tick from L thigh today - thinks was biting for 4-5 days -now with redness and mild discomfort in large area around bite that he noticed today -no fevers, malaise, HA, fevers, rash elsewhere, SOB or any other symptoms  ROS: See pertinent positives and negatives per HPI.  Past Medical History:  Diagnosis Date  . Arthritis    neck   . Atrial fibrillation ([place])    a. new-onset in 01/2017. Started on Eliquis  . Calcification of coronary artery    a. mild, nonobstructive CAD by cath in 2009.  Marland Kitchen Cancer of skin of chest   . Carotid artery occlusion    a. s/p R CEA in 2014  . Colon polyps   . Hyperlipidemia    takes Pravastatin every other day  . Pneumonia ~ 01/2013    Past Surgical History:  Procedure Laterality Date  . CARDIAC CATHETERIZATION  06/09/1999   mild CAD (LAD, diagonal, RCA) - Dr. Loni Muse. [place]  . CARDIOVERSION N/A 03/25/2017   Procedure: CARDIOVERSION;  Surgeon: Pixie Casino, MD;  Location: [facility] ENDOSCOPY;  Service: Cardiovascular;  Laterality: N/A;  . CAROTID DOPPLER  02/2012   60-79% RICA stenosis, 15-17% LICA stenosis (prior to carotid endarterectomy)  . COLONOSCOPY    . ENDARTERECTOMY Right 11/10/2013   Procedure: ENDARTERECTOMY CAROTID;  Surgeon: Conrad White, MD;  Location: [place];  Service: Vascular;  Laterality: Right;  . INGUINAL HERNIA REPAIR Left   . MOLE REMOVAL  1990s   "chest"  . NM MYOCAR PERF WALL MOTION  04/2011   bruce myoview - perfusion defect in inferior myocardium (diaphragmatic attenuation), remaining myocardium with normal perfusion, EF 67%  . PATCH ANGIOPLASTY Right 11/10/2013   Procedure: PATCH ANGIOPLASTY;  Surgeon: Conrad [place], MD;  Location: [place];  Service: Vascular;  Laterality: Right;  . PILONIDAL CYST DRAINAGE    . SHOULDER OPEN ROTATOR CUFF REPAIR Bilateral 1998 - 2001  . SKIN CANCER EXCISION  ~ 2015   "chest"  . TONSILLECTOMY      Family History  Problem Relation Age of Onset   . Stroke Father   . Hypertension Mother   . Prostate cancer Son   . Stroke Brother   . Cancer Brother        spine    Social History   Social History  . Marital status: Married    Spouse name: N/A  . Number of children: 3  . Years of education: N/A   Occupational History  .  [employer] History Main Topics  . Smoking status: Former Smoker    Packs/day: 1.00    Years: 18.00    Types: Cigarettes    Quit date: 12/03/1972  . Smokeless tobacco: Never Used  . Alcohol use 8.4 oz/week    7 Glasses of wine, 7 Shots of liquor per week  . Drug use: No  . Sexual activity: Yes   Other Topics Concern  . None   Social History Narrative  . None     Current Outpatient Prescriptions:  .  ALPHA LIPOIC ACID PO, Take 300 mg by mouth daily., Disp: , Rfl:  .  apixaban (ELIQUIS) 5 MG TABS tablet, Take 1 tablet (5 mg total) by mouth 2 (two) times daily., Disp: 60 tablet, Rfl: 11 .  Cholecalciferol (VITAMIN D3) 2000 UNITS capsule, Take 2,000 Units by mouth daily. , Disp: , Rfl:  .  Coenzyme Q10-Levocarnitine 100-20 MG CAPS, Take  1 capsule by mouth daily. , Disp: , Rfl:  .  flecainide (TAMBOCOR) 50 MG tablet, Take 1 tablet (50 mg total) by mouth 2 (two) times daily., Disp: 270 tablet, Rfl: 1 .  Glucosamine-Chondroitin 500-400 MG CAPS, Take 1 capsule by mouth daily. , Disp: , Rfl:  .  hydrocortisone (ANUSOL-HC) 2.5 % rectal cream, Place 1 application rectally 2 (two) times daily. (Patient taking differently: Place 1 application rectally 2 (two) times daily as needed for itching. ), Disp: 30 g, Rfl: 0 .  Magnesium 500 MG TABS, Take 1 tablet by mouth daily., Disp: , Rfl:  .  metoprolol tartrate (LOPRESSOR) 25 MG tablet, Take 0.5 tablets (12.5 mg total) by mouth 2 (two) times daily., Disp: 30 tablet, Rfl: 11 .  Multiple Vitamin (MULTIVITAMIN) tablet, Take 0.5 tablets by mouth daily. , Disp: , Rfl:  .  OMEGA-3 1000 MG CAPS, Take 4,000 mg by mouth daily., Disp: , Rfl:  .  pravastatin  (PRAVACHOL) 40 MG tablet, TAKE 1TABLET BY MOUTH AT BEDTIME. (Patient taking differently: Take 20 mg by mouth every Monday, Wednesday, and Friday. ), Disp: 90 tablet, Rfl: 3 .  Probiotic Product (PROBIOTIC DAILY PO), Take 1 capsule by mouth daily., Disp: , Rfl:  .  sildenafil (REVATIO) 20 MG tablet, TAKE 2-3 TABLETS DAILY AS NEEDED FOR ERECTILE DYSFUNCTION., Disp: 60 tablet, Rfl: 4 .  amoxicillin (AMOXIL) 500 MG capsule, Take 1 capsule (500 mg total) by mouth 2 (two) times daily., Disp: 14 capsule, Rfl: 0 .  doxycycline (VIBRA-TABS) 100 MG tablet, Take 1 tablet (100 mg total) by mouth 2 (two) times daily., Disp: 14 tablet, Rfl: 0  EXAM:  Vitals:   05/09/17 1702  BP: 100/60  Pulse: 65  Temp: 97.9 F (36.6 C)    Body mass index is 27.69 kg/m.  GENERAL: vitals reviewed and listed above, alert, oriented, appears well hydrated and in no acute distress  HEENT: atraumatic, conjunttiva clear, no obvious abnormalities on inspection of external nose and ears  NECK: no obvious masses on inspection  SKIN: small erythematous papule L thigh with surrounding irregularly shaped patch of erythema and mild induration the size of a grapefruit in diameter, no purulence, appreciable abscess or streaking   MS: moves all extremities without noticeable abnormality  PSYCH: pleasant and cooperative, no obvious depression or anxiety  ASSESSMENT AND PLAN:  Discussed the following assessment and plan:  Tick bite, initial encounter  Cellulitis of left lower extremity  -tick bite with local reaction vs cellulitis -doxy for staph coverage and lyme prophy; amox for strep coverage - he has listed vantin allergy which he denies - reports never had abx allergy - did make him aware of possible reaction to amox in pts with vantin allergy - he feels strongly this is an error in allergy list -follow up in a few days to recheck -marked area  -Patient advised to return or notify a doctor immediately in the interim  if symptoms worsen or new concerns arise.  Patient Instructions  BEFORE YOU LEAVE: -follow up: Monday or Tuesday  Start the antibiotics today and take as instructed.  I hope you are feeling better soon! Seek care sooner and promptly if worsening, new concerns or you are not improving with treatment.      Colin Benton R., DO

## 2017-05-09 NOTE — Telephone Encounter (Signed)
Patient walked in to office to be seen for tick bite. Pt reports that he removed tick from his left inner thigh near his groin. Pt reports area is itching, red and swollen. Upon exam area has large weal with large, firm welt/swollen area. It is warm to the touch. Area may be infected. He did remove whole tick and presented it in a ziploc bag, tick is alive and moving, so head is not impacted in pt's leg. Pt denies any f/n/v, headache, body aches or bulls eye rash.  Advised pt he needs to have area seen and evaluated by provider. Pt agrees. Appt scheduled with Dr. Maudie Mercury for 5pm today. Pt to return to office to be seen.  Dr. Maudie Mercury - FYI. Thanks!

## 2017-05-09 NOTE — Patient Instructions (Signed)
BEFORE YOU LEAVE: -follow up: Monday or Tuesday  Start the antibiotics today and take as instructed.  I hope you are feeling better soon! Seek care sooner and promptly if worsening, new concerns or you are not improving with treatment.

## 2017-05-09 NOTE — Progress Notes (Signed)
Patient presented for nurse visit EKG to follow up on 05/02/17 EKG check where flecainide was decreased from 75mg  BID to 50mg  BID. Since this visit, patient has also decreased metoprolol tartrate from 12.5mg  BID to 6.25mg  BID per telephone call w/Luke, PA. Patient reports higher BP in the evenings (031R systolic) since starting flecainide and reports continued fatigue. BP checked during nurse visit = 130-56 and HR 62 on EKG.   PR: 224 WRD: 168 QT/QTc: 494/555ms  Reviewed by DOD Dr. Claiborne Billings Patient has converted to SR, QTc slightly less prolonged than 05/02/17 EKG No med changes warranted Cancel cardioversion for 6/20 Patient has MD OV 06/12/17  Routed to Dr. Debara Pickett as Juluis Rainier  Side note: patient reports tick bite. He discovered tick on right upper posterior thigh and brought tick in plastic baggie. Informed DOD of this, who advised to monitor site for erythema and seek PCP or UC eval. Advised patient may need tick-borne illness specific labs and/or course of abx therapy. He will seek eval from PCP.

## 2017-05-10 ENCOUNTER — Telehealth: Payer: Self-pay | Admitting: Internal Medicine

## 2017-05-10 NOTE — Telephone Encounter (Signed)
Call made to [facility] to cancel 05/22/17 cardioversion per EKG check appt on 05/09/17

## 2017-05-14 ENCOUNTER — Emergency Department (HOSPITAL_COMMUNITY): Payer: Medicare Other

## 2017-05-14 ENCOUNTER — Other Ambulatory Visit (HOSPITAL_COMMUNITY): Payer: Self-pay

## 2017-05-14 ENCOUNTER — Encounter (HOSPITAL_COMMUNITY): Payer: Self-pay | Admitting: *Deleted

## 2017-05-14 ENCOUNTER — Ambulatory Visit (INDEPENDENT_AMBULATORY_CARE_PROVIDER_SITE_OTHER): Payer: Medicare Other | Admitting: Internal Medicine

## 2017-05-14 ENCOUNTER — Inpatient Hospital Stay (HOSPITAL_COMMUNITY)
Admission: EM | Admit: 2017-05-14 | Discharge: 2017-05-17 | DRG: 247 | Disposition: A | Discharge status: Home / Self Care | Payer: Medicare Other | Attending: Interventional Cardiology | Admitting: Interventional Cardiology

## 2017-05-14 ENCOUNTER — Other Ambulatory Visit: Payer: Self-pay

## 2017-05-14 ENCOUNTER — Encounter: Payer: Self-pay | Admitting: Internal Medicine

## 2017-05-14 VITALS — BP 142/78 | HR 58 | Temp 97.6°F | Ht 70.0 in | Wt 191.0 lb

## 2017-05-14 DIAGNOSIS — I480 Paroxysmal atrial fibrillation: Secondary | ICD-10-CM

## 2017-05-14 DIAGNOSIS — I25110 Atherosclerotic heart disease of native coronary artery with unstable angina pectoris: Secondary | ICD-10-CM | POA: Diagnosis not present

## 2017-05-14 DIAGNOSIS — I10 Essential (primary) hypertension: Secondary | ICD-10-CM | POA: Diagnosis not present

## 2017-05-14 DIAGNOSIS — Z7901 Long term (current) use of anticoagulants: Secondary | ICD-10-CM | POA: Diagnosis not present

## 2017-05-14 DIAGNOSIS — I2510 Atherosclerotic heart disease of native coronary artery without angina pectoris: Secondary | ICD-10-CM

## 2017-05-14 DIAGNOSIS — W57XXXD Bitten or stung by nonvenomous insect and other nonvenomous arthropods, subsequent encounter: Secondary | ICD-10-CM | POA: Diagnosis not present

## 2017-05-14 DIAGNOSIS — E119 Type 2 diabetes mellitus without complications: Secondary | ICD-10-CM

## 2017-05-14 DIAGNOSIS — I739 Peripheral vascular disease, unspecified: Secondary | ICD-10-CM

## 2017-05-14 DIAGNOSIS — R531 Weakness: Secondary | ICD-10-CM | POA: Diagnosis not present

## 2017-05-14 DIAGNOSIS — I4891 Unspecified atrial fibrillation: Secondary | ICD-10-CM | POA: Diagnosis present

## 2017-05-14 DIAGNOSIS — Z888 Allergy status to other drugs, medicaments and biological substances status: Secondary | ICD-10-CM | POA: Diagnosis not present

## 2017-05-14 DIAGNOSIS — I779 Disorder of arteries and arterioles, unspecified: Secondary | ICD-10-CM | POA: Diagnosis not present

## 2017-05-14 DIAGNOSIS — Z79899 Other long term (current) drug therapy: Secondary | ICD-10-CM

## 2017-05-14 DIAGNOSIS — Z955 Presence of coronary angioplasty implant and graft: Secondary | ICD-10-CM

## 2017-05-14 DIAGNOSIS — I208 Other forms of angina pectoris: Secondary | ICD-10-CM | POA: Diagnosis not present

## 2017-05-14 DIAGNOSIS — E785 Hyperlipidemia, unspecified: Secondary | ICD-10-CM | POA: Diagnosis present

## 2017-05-14 DIAGNOSIS — Z9889 Other specified postprocedural states: Secondary | ICD-10-CM | POA: Diagnosis not present

## 2017-05-14 DIAGNOSIS — R9431 Abnormal electrocardiogram [ECG] [EKG]: Secondary | ICD-10-CM | POA: Diagnosis not present

## 2017-05-14 DIAGNOSIS — R079 Chest pain, unspecified: Secondary | ICD-10-CM | POA: Diagnosis present

## 2017-05-14 DIAGNOSIS — Z7982 Long term (current) use of aspirin: Secondary | ICD-10-CM

## 2017-05-14 DIAGNOSIS — S70362D Insect bite (nonvenomous), left thigh, subsequent encounter: Secondary | ICD-10-CM

## 2017-05-14 DIAGNOSIS — E7800 Pure hypercholesterolemia, unspecified: Secondary | ICD-10-CM | POA: Diagnosis not present

## 2017-05-14 DIAGNOSIS — I481 Persistent atrial fibrillation: Secondary | ICD-10-CM | POA: Diagnosis present

## 2017-05-14 DIAGNOSIS — I200 Unstable angina: Secondary | ICD-10-CM | POA: Diagnosis present

## 2017-05-14 HISTORY — DX: Atherosclerotic heart disease of native coronary artery without angina pectoris: I25.10

## 2017-05-14 LAB — COMPREHENSIVE METABOLIC PANEL
ALT: 30 U/L (ref 17–63)
ANION GAP: 9 (ref 5–15)
AST: 23 U/L (ref 15–41)
Albumin: 3.8 g/dL (ref 3.5–5.0)
Alkaline Phosphatase: 65 U/L (ref 38–126)
BILIRUBIN TOTAL: 0.7 mg/dL (ref 0.3–1.2)
BUN: 16 mg/dL (ref 6–20)
CO2: 24 mmol/L (ref 22–32)
Calcium: 9.1 mg/dL (ref 8.9–10.3)
Chloride: 101 mmol/L (ref 101–111)
Creatinine, Ser: 1.06 mg/dL (ref 0.61–1.24)
GFR calc Af Amer: >60 mL/min (ref >60)
GFR calc non Af Amer: >60 mL/min (ref >60)
Glucose, Bld: 106 mg/dL — ABNORMAL HIGH (ref 65–99)
POTASSIUM: 3.8 mmol/L (ref 3.5–5.1)
Sodium: 134 mmol/L — ABNORMAL LOW (ref 135–145)
TOTAL PROTEIN: 6.9 g/dL (ref 6.5–8.1)

## 2017-05-14 LAB — CBC WITH DIFFERENTIAL/PLATELET
BASOS PCT: 0 %
Basophils Absolute: 0 10*3/uL (ref 0.0–0.1)
Eosinophils Absolute: 0.2 10*3/uL (ref 0.0–0.7)
Eosinophils Relative: 4 %
HEMATOCRIT: 41.5 % (ref 39.0–52.0)
Hemoglobin: 14.7 g/dL (ref 13.0–17.0)
LYMPHS PCT: 39 %
Lymphs Abs: 2.1 10*3/uL (ref 0.7–4.0)
MCH: 31.8 pg (ref 26.0–34.0)
MCHC: 35.4 g/dL (ref 30.0–36.0)
MCV: 89.8 fL (ref 78.0–100.0)
MONO ABS: 0.4 10*3/uL (ref 0.1–1.0)
MONOS PCT: 8 %
NEUTROS ABS: 2.6 10*3/uL (ref 1.7–7.7)
Neutrophils Relative %: 49 %
Platelets: 194 10*3/uL (ref 150–400)
RBC: 4.62 MIL/uL (ref 4.22–5.81)
RDW: 13.1 % (ref 11.5–15.5)
WBC: 5.3 10*3/uL (ref 4.0–10.5)

## 2017-05-14 LAB — TROPONIN I: Troponin I: <0.03 ng/mL (ref <0.03)

## 2017-05-14 MED ORDER — MORPHINE SULFATE (PF) 2 MG/ML IV SOLN
2.0000 mg | Freq: Once | INTRAVENOUS | Status: AC
  Administered 2017-05-14: 2 mg via INTRAVENOUS
  Filled 2017-05-14: qty 1

## 2017-05-14 MED ORDER — NITROGLYCERIN 0.4 MG SL SUBL
0.4000 mg | SUBLINGUAL_TABLET | SUBLINGUAL | Status: DC | PRN
  Administered 2017-05-14: 0.4 mg via SUBLINGUAL
  Filled 2017-05-14: qty 1

## 2017-05-14 MED ORDER — NITROGLYCERIN 2 % TD OINT
1.0000 [in_us] | TOPICAL_OINTMENT | Freq: Once | TRANSDERMAL | Status: AC
  Administered 2017-05-15: 1 [in_us] via TOPICAL
  Filled 2017-05-14: qty 1

## 2017-05-14 NOTE — Patient Instructions (Addendum)
WE NOW OFFER   [facility] FAST TRACK!!!  SAME DAY Appointments for ACUTE CARE  Such as: Sprains, Injuries, cuts, abrasions, rashes, muscle pain, joint pain, back pain Colds, flu, sore throats, headache, allergies, cough, fever  Ear pain, sinus and eye infections Abdominal pain, nausea, vomiting, diarrhea, upset stomach Animal/insect bites  3 Easy Ways to Schedule: Walk-In Scheduling Call in scheduling Mychart Sign-up: [email or web address]  Take your antibiotic as prescribed until ALL of it is gone, but stop if you develop a rash, swelling, or any side effects of the medication.  Contact our office as soon as possible if  there are side effects of the medication.

## 2017-05-14 NOTE — Progress Notes (Signed)
Subjective:    Patient ID: Sean Miranda, male    DOB: February 22, 1930, 83 y.o.   MRN: [id number]  HPI  83 year old patient who was seen 4 days ago with a tick bite involving the left upper inner thigh region.  He was placed on doxycycline and amoxicillin which he has tolerated well. He has 3 days left of antibiotic therapy No fever or other constitutional complaints  He is followed closely by cardiology with atrial fibrillation and remains on rhythm control therapy as well as anticoagulation  Past Medical History:  Diagnosis Date  . Arthritis    neck   . Atrial fibrillation ([place])    a. new-onset in 01/2017. Started on Eliquis  . Calcification of coronary artery    a. mild, nonobstructive CAD by cath in 2009.  Marland Kitchen Cancer of skin of chest   . Carotid artery occlusion    a. s/p R CEA in 2014  . Colon polyps   . Hyperlipidemia    takes Pravastatin every other day  . Pneumonia ~ 01/2013     Social History   Social History  . Marital status: Married    Spouse name: N/A  . Number of children: 3  . Years of education: N/A   Occupational History  .  [employer] History Main Topics  . Smoking status: Former Smoker    Packs/day: 1.00    Years: 18.00    Types: Cigarettes    Quit date: 12/03/1972  . Smokeless tobacco: Never Used  . Alcohol use 8.4 oz/week    7 Glasses of wine, 7 Shots of liquor per week  . Drug use: No  . Sexual activity: Yes   Other Topics Concern  . Not on file   Social History Narrative  . No narrative on file    Past Surgical History:  Procedure Laterality Date  . CARDIAC CATHETERIZATION  06/09/1999   mild CAD (LAD, diagonal, RCA) - Dr. Loni Muse. [place]  . CARDIOVERSION N/A 03/25/2017   Procedure: CARDIOVERSION;  Surgeon: Pixie Casino, MD;  Location: [facility] ENDOSCOPY;  Service: Cardiovascular;  Laterality: N/A;  . CAROTID DOPPLER  02/2012   60-79% RICA stenosis, 64-33% LICA stenosis (prior to carotid endarterectomy)  . COLONOSCOPY    .  ENDARTERECTOMY Right 11/10/2013   Procedure: ENDARTERECTOMY CAROTID;  Surgeon: Conrad [place], MD;  Location: [place];  Service: Vascular;  Laterality: Right;  . INGUINAL HERNIA REPAIR Left   . MOLE REMOVAL  1990s   "chest"  . NM MYOCAR PERF WALL MOTION  04/2011   bruce myoview - perfusion defect in inferior myocardium (diaphragmatic attenuation), remaining myocardium with normal perfusion, EF 67%  . PATCH ANGIOPLASTY Right 11/10/2013   Procedure: PATCH ANGIOPLASTY;  Surgeon: Conrad [place], MD;  Location: [place];  Service: Vascular;  Laterality: Right;  . PILONIDAL CYST DRAINAGE    . SHOULDER OPEN ROTATOR CUFF REPAIR Bilateral 1998 - 2001  . SKIN CANCER EXCISION  ~ 2015   "chest"  . TONSILLECTOMY      Family History  Problem Relation Age of Onset  . Stroke Father   . Hypertension Mother   . Prostate cancer Son   . Stroke Brother   . Cancer Brother        spine    Allergies  Allergen Reactions  . Lipitor [Atorvastatin Calcium] Other (See Comments)    Muscle pain  . Vantin [Cefpodoxime] Rash    Current Outpatient Prescriptions on File Prior to Visit  Medication Sig Dispense Refill  . ALPHA LIPOIC ACID PO Take 300 mg by mouth daily.    Marland Kitchen amoxicillin (AMOXIL) 500 MG capsule Take 1 capsule (500 mg total) by mouth 2 (two) times daily. 14 capsule 0  . apixaban (ELIQUIS) 5 MG TABS tablet Take 1 tablet (5 mg total) by mouth 2 (two) times daily. 60 tablet 11  . Cholecalciferol (VITAMIN D3) 2000 UNITS capsule Take 2,000 Units by mouth daily.     . Coenzyme Q10-Levocarnitine 100-20 MG CAPS Take 1 capsule by mouth daily.     Marland Kitchen doxycycline (VIBRA-TABS) 100 MG tablet Take 1 tablet (100 mg total) by mouth 2 (two) times daily. 14 tablet 0  . flecainide (TAMBOCOR) 50 MG tablet Take 1 tablet (50 mg total) by mouth 2 (two) times daily. 270 tablet 1  . Glucosamine-Chondroitin 500-400 MG CAPS Take 1 capsule by mouth daily.     . hydrocortisone (ANUSOL-HC) 2.5 % rectal cream Place 1 application rectally 2  (two) times daily. (Patient taking differently: Place 1 application rectally 2 (two) times daily as needed for itching. ) 30 g 0  . Magnesium 500 MG TABS Take 1 tablet by mouth daily.    . metoprolol tartrate (LOPRESSOR) 25 MG tablet Take 0.5 tablets (12.5 mg total) by mouth 2 (two) times daily. 30 tablet 11  . Multiple Vitamin (MULTIVITAMIN) tablet Take 0.5 tablets by mouth daily.     . OMEGA-3 1000 MG CAPS Take 4,000 mg by mouth daily.    . pravastatin (PRAVACHOL) 40 MG tablet TAKE 1TABLET BY MOUTH AT BEDTIME. (Patient taking differently: Take 20 mg by mouth every Monday, Wednesday, and Friday. ) 90 tablet 3  . Probiotic Product (PROBIOTIC DAILY PO) Take 1 capsule by mouth daily.    . sildenafil (REVATIO) 20 MG tablet TAKE 2-3 TABLETS DAILY AS NEEDED FOR ERECTILE DYSFUNCTION. 60 tablet 4   No current facility-administered medications on file prior to visit.     BP (!) 142/78 (BP Location: Left Arm, Patient Position: Sitting, Cuff Size: Normal)   Pulse (!) 58   Temp 97.6 F (36.4 C) (Oral)   Ht 5\' 10"  (1.778 m)   Wt 191 lb (86.6 kg)   SpO2 98%   BMI 27.41 kg/m     Review of Systems  Constitutional: Negative for appetite change, chills, fatigue and fever.  HENT: Negative for congestion, dental problem, ear pain, hearing loss, sore throat, tinnitus, trouble swallowing and voice change.   Eyes: Negative for pain, discharge and visual disturbance.  Respiratory: Negative for cough, chest tightness, wheezing and stridor.   Cardiovascular: Negative for chest pain, palpitations and leg swelling.  Gastrointestinal: Negative for abdominal distention, abdominal pain, blood in stool, constipation, diarrhea, nausea and vomiting.  Genitourinary: Negative for difficulty urinating, discharge, flank pain, genital sores, hematuria and urgency.  Musculoskeletal: Negative for arthralgias, back pain, gait problem, joint swelling, myalgias and neck stiffness.  Skin: Positive for rash.  Neurological:  Negative for dizziness, syncope, speech difficulty, weakness, numbness and headaches.  Hematological: Negative for adenopathy. Does not bruise/bleed easily.  Psychiatric/Behavioral: Negative for behavioral problems and dysphoric mood. The patient is not nervous/anxious.        Objective:   Physical Exam  Constitutional: He appears well-developed and well-nourished. No distress.  Blood pressure well controlled   Cardiovascular:  Pulse irregular  Skin:  A small area of induration involving the left upper  Thigh.  Erythema has resolved          Assessment & Plan:  Status post tick bite with early cellulitis.  Resolved Patient will complete his full antibiotic regimen for an additional 3 days and report any clinical change  Follow cardiology  Annual exam as scheduled in January  [email or web address],PETER FRANK

## 2017-05-14 NOTE — ED Provider Notes (Signed)
[address] DEPT Provider Note   CSN: [id number] Arrival date & time: 05/14/17  2118     History   Chief Complaint Chief Complaint  Patient presents with  . Chest Pain    HPI Sean Miranda is a 83 y.o. male.  Patient states that he had chest pain from 9-10 today. This was in the evening. He has not had this pain before. Patient has a history of atrial flutter which has been cardioverted.   The history is provided by the patient.  Chest Pain   This is a new problem. The current episode started 1 to 2 hours ago. The problem occurs rarely. The problem has been resolved. The pain is associated with exertion. The pain is present in the substernal region. The pain is at a severity of 8/10. The pain is moderate. The quality of the pain is described as dull. The pain does not radiate. Pertinent negatives include no abdominal pain, no back pain, no cough and no headaches.  Pertinent negatives for past medical history include no seizures.    Past Medical History:  Diagnosis Date  . Arthritis    neck   . Atrial fibrillation ([place])    a. new-onset in 01/2017. Started on Eliquis  . Calcification of coronary artery    a. mild, nonobstructive CAD by cath in 2009.  Marland Kitchen Cancer of skin of chest   . Carotid artery occlusion    a. s/p R CEA in 2014  . Colon polyps   . Hyperlipidemia    takes Pravastatin every other day  . Pneumonia ~ 01/2013    Patient Active Problem List   Diagnosis Date Noted  . Chest pain 05/14/2017  . Other fatigue 04/16/2017  . Pre-procedure lab exam 04/16/2017  . Blood clotting disorder ([place]) 04/16/2017  . Atrial fibrillation ([place]) 02/21/2017  . 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 Hemoptysis 02/04/2015  . Aftercare following surgery of the circulatory system, [place] 05/28/2014  . Essential hypertension 05/27/2014  . Exertional chest pain 05/27/2014  . Carotid stenosis 11/10/2013  . Occlusion and stenosis of carotid artery without mention of  cerebral infarction 11/06/2013  . 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 Edema 04/02/2013  . Coronary atherosclerosis 11/03/2010  . Carotid artery disease ([place]) 12/26/2009  . Hyperlipidemia 08/12/2008  . COLONIC POLYPS, HX OF 06/03/2007    Past Surgical History:  Procedure Laterality Date  . CARDIAC CATHETERIZATION  06/09/1999   mild CAD (LAD, diagonal, RCA) - Dr. Loni Muse. [place]  . CARDIOVERSION N/A 03/25/2017   Procedure: CARDIOVERSION;  Surgeon: Pixie Casino, MD;  Location: [facility] ENDOSCOPY;  Service: Cardiovascular;  Laterality: N/A;  . CAROTID DOPPLER  02/2012   60-79% RICA stenosis, 46-50% LICA stenosis (prior to carotid endarterectomy)  . COLONOSCOPY    . ENDARTERECTOMY Right 11/10/2013   Procedure: ENDARTERECTOMY CAROTID;  Surgeon: Conrad Fairlee, MD;  Location: [place];  Service: Vascular;  Laterality: Right;  . INGUINAL HERNIA REPAIR Left   . MOLE REMOVAL  1990s   "chest"  . NM MYOCAR PERF WALL MOTION  04/2011   bruce myoview - perfusion defect in inferior myocardium (diaphragmatic attenuation), remaining myocardium with normal perfusion, EF 67%  . PATCH ANGIOPLASTY Right 11/10/2013   Procedure: PATCH ANGIOPLASTY;  Surgeon: Conrad [place], MD;  Location: [place];  Service: Vascular;  Laterality: Right;  . PILONIDAL CYST DRAINAGE    . SHOULDER OPEN ROTATOR CUFF REPAIR Bilateral 1998 - 2001  . SKIN CANCER EXCISION  ~ 2015   "  chest"  . TONSILLECTOMY         Home Medications    Prior to Admission medications   Medication Sig Start Date End Date Taking? Authorizing Provider  ALPHA LIPOIC ACID PO Take 300 mg by mouth daily.   Yes [provider]  amoxicillin (AMOXIL) 500 MG capsule Take 1 capsule (500 mg total) by mouth 2 (two) times daily. 05/09/17  Yes Lucretia Kern, DO  apixaban (ELIQUIS) 5 MG TABS tablet Take 1 tablet (5 mg total) by mouth 2 (two) times daily. 02/22/17  Yes Bhagat, Bhavinkumar, PA  Cholecalciferol (VITAMIN D3) 2000 UNITS capsule Take 2,000 Units by mouth daily.    Yes  [provider]  Coenzyme Q10-Levocarnitine 100-20 MG CAPS Take 1 capsule by mouth daily.    Yes [provider]  doxycycline (VIBRA-TABS) 100 MG tablet Take 1 tablet (100 mg total) by mouth 2 (two) times daily. 05/09/17  Yes Lucretia Kern, DO  flecainide (TAMBOCOR) 50 MG tablet Take 1 tablet (50 mg total) by mouth 2 (two) times daily. 05/02/17  Yes Croitoru, Mihai, MD  Glucosamine-Chondroitin 500-400 MG CAPS Take 1 capsule by mouth daily.    Yes [provider]  hydrocortisone (ANUSOL-HC) 2.5 % rectal cream Place 1 application rectally 2 (two) times daily. Patient taking differently: Place 1 application rectally 2 (two) times daily as needed for itching.  10/08/16  Yes Colin Benton R, DO  Magnesium 500 MG TABS Take 1 tablet by mouth daily.   Yes [provider]  metoprolol tartrate (LOPRESSOR) 25 MG tablet Take 0.5 tablets (12.5 mg total) by mouth 2 (two) times daily. Patient taking differently: Take 6.25 mg by mouth daily.  02/22/17  Yes Bhagat, Bhavinkumar, PA  Multiple Vitamin (MULTIVITAMIN) tablet Take 0.5 tablets by mouth daily.    Yes [provider]  OMEGA-3 1000 MG CAPS Take 4,000 mg by mouth daily.   Yes [provider]  pravastatin (PRAVACHOL) 40 MG tablet TAKE 1TABLET BY MOUTH AT BEDTIME. Patient taking differently: Take 20 mg by mouth every Monday, Wednesday, and Friday.  12/21/16  Yes Marletta Lor, MD  Probiotic Product (PROBIOTIC DAILY PO) Take 1 capsule by mouth daily.   Yes [provider]  sildenafil (REVATIO) 20 MG tablet TAKE 2-3 TABLETS DAILY AS NEEDED FOR ERECTILE DYSFUNCTION. 11/20/16  Yes Marletta Lor, MD    Family History Family History  Problem Relation Age of Onset  . Stroke Father   . Hypertension Mother   . Prostate cancer Son   . Stroke Brother   . Cancer Brother        spine    Social History Social History  Substance Use Topics  . Smoking status: Former Smoker    Packs/day: 1.00     Years: 18.00    Types: Cigarettes    Quit date: 12/03/1972  . Smokeless tobacco: Never Used  . Alcohol use 8.4 oz/week    7 Glasses of wine, 7 Shots of liquor per week     Allergies   Lipitor [atorvastatin calcium] and Vantin [cefpodoxime]   Review of Systems Review of Systems  Constitutional: Negative for appetite change and fatigue.  HENT: Negative for congestion, ear discharge and sinus pressure.   Eyes: Negative for discharge.  Respiratory: Negative for cough.   Cardiovascular: Positive for chest pain.  Gastrointestinal: Negative for abdominal pain and diarrhea.  Genitourinary: Negative for frequency and hematuria.  Musculoskeletal: Negative for back pain.  Skin: Negative for rash.  Neurological: Negative  for seizures and headaches.  Psychiatric/Behavioral: Negative for hallucinations.     Physical Exam Updated Vital Signs BP (!) 143/80   Pulse 61   Temp 97.9 F (36.6 C)   Resp 16   Ht 6' (1.829 m)   Wt 82.1 kg (181 lb)   SpO2 98%   BMI 24.55 kg/m   Physical Exam  Constitutional: He is oriented to person, place, and time. He appears well-developed.  HENT:  Head: Normocephalic.  Eyes: Conjunctivae and EOM are normal. No scleral icterus.  Neck: Neck supple. No thyromegaly present.  Cardiovascular: Normal rate and regular rhythm.  Exam reveals no gallop and no friction rub.   No murmur heard. Pulmonary/Chest: No stridor. He has no wheezes. He has no rales. He exhibits no tenderness.  Abdominal: He exhibits no distension. There is no tenderness. There is no rebound.  Musculoskeletal: Normal range of motion. He exhibits no edema.  Lymphadenopathy:    He has no cervical adenopathy.  Neurological: He is oriented to person, place, and time. He exhibits normal muscle tone. Coordination normal.  Skin: No rash noted. No erythema.  Psychiatric: He has a normal mood and affect. His behavior is normal.     ED Treatments / Results  Labs (all labs ordered are listed,  but only abnormal results are displayed) Labs Reviewed  COMPREHENSIVE METABOLIC PANEL - Abnormal; Notable for the following:       Result Value   Sodium 134 (*)    Glucose, Bld 106 (*)    All other components within normal limits  TROPONIN I  CBC WITH DIFFERENTIAL/PLATELET    EKG  EKG Interpretation None       Radiology Dg Chest 2 View  Result Date: 05/14/2017 CLINICAL DATA:  Left-sided chest pain EXAM: CHEST  2 VIEW COMPARISON:  02/21/2017 FINDINGS: Linear scarring or atelectasis at both lung bases. No focal consolidation or pleural effusion. Cardiomediastinal silhouette within normal limits with atherosclerosis. No pneumothorax. Postsurgical changes of the left humeral head IMPRESSION: Linear scarring or atelectasis at the lung bases. No acute infiltrate or edema. Electronically Signed   By: Donavan Foil M.D.   On: 05/14/2017 23:32    Procedures Procedures (including critical care time)  Medications Ordered in ED Medications  nitroGLYCERIN (NITROSTAT) SL tablet 0.4 mg (0.4 mg Sublingual Given 05/14/17 2254)  nitroGLYCERIN (NITROGLYN) 2 % ointment 1 inch (not administered)  morphine 2 MG/ML injection 2 mg (2 mg Intravenous Given 05/14/17 2254)     Initial Impression / Assessment and Plan / ED Course  I have reviewed the triage vital signs and the nursing notes.  Pertinent labs & imaging results that were available during my care of the patient were reviewed by me and considered in my medical decision making (see chart for details).     Patient with chest pain that has resolved. Labs chest x-ray and EKG did not show any acute problems. Patient will be admitted to medicine and have his troponins cycled Final Clinical Impressions(s) / ED Diagnoses   Final diagnoses:  Stable angina pectoris [facility])    New Prescriptions New Prescriptions   No medications on file     Milton Ferguson, MD 05/14/17 2347

## 2017-05-14 NOTE — ED Triage Notes (Signed)
Pt c/o left sided chest pain x 15 minutes that is a pushing sensation.

## 2017-05-15 ENCOUNTER — Observation Stay (HOSPITAL_BASED_OUTPATIENT_CLINIC_OR_DEPARTMENT_OTHER): Payer: Medicare Other

## 2017-05-15 ENCOUNTER — Encounter (HOSPITAL_COMMUNITY): Payer: Self-pay

## 2017-05-15 DIAGNOSIS — R531 Weakness: Secondary | ICD-10-CM

## 2017-05-15 DIAGNOSIS — R079 Chest pain, unspecified: Secondary | ICD-10-CM

## 2017-05-15 DIAGNOSIS — I10 Essential (primary) hypertension: Secondary | ICD-10-CM | POA: Diagnosis not present

## 2017-05-15 DIAGNOSIS — I481 Persistent atrial fibrillation: Secondary | ICD-10-CM | POA: Diagnosis not present

## 2017-05-15 DIAGNOSIS — R9431 Abnormal electrocardiogram [ECG] [EKG]: Secondary | ICD-10-CM | POA: Diagnosis not present

## 2017-05-15 DIAGNOSIS — I779 Disorder of arteries and arterioles, unspecified: Secondary | ICD-10-CM | POA: Diagnosis not present

## 2017-05-15 DIAGNOSIS — Z9889 Other specified postprocedural states: Secondary | ICD-10-CM

## 2017-05-15 DIAGNOSIS — I208 Other forms of angina pectoris: Secondary | ICD-10-CM | POA: Diagnosis not present

## 2017-05-15 DIAGNOSIS — E7800 Pure hypercholesterolemia, unspecified: Secondary | ICD-10-CM | POA: Diagnosis not present

## 2017-05-15 LAB — ECHOCARDIOGRAM COMPLETE
CHL CUP MV DEC (S): 250
E decel time: 250 msec
E/e' ratio: 9.36
FS: 39 % (ref 28–44)
HEIGHTINCHES: 72 in
IV/PV OW: 0.92
LA ID, A-P, ES: 32 mm
LA diam index: 1.54 cm/m2
LA vol A4C: 67.5 ml
LAVOL: 59.4 mL
LAVOLIN: 28.5 mL/m2
LDCA: 3.14 cm2
LEFT ATRIUM END SYS DIAM: 32 mm
LV E/e'average: 9.36
LV TDI E'LATERAL: 10.3
LV sys vol index: 12 mL/m2
LV sys vol: 25 mL (ref 21–61)
LVDIAVOL: 68 mL (ref 62–150)
LVDIAVOLIN: 33 mL/m2
LVEEMED: 9.36
LVELAT: 10.3 cm/s
LVOT VTI: 28 cm
LVOT peak grad rest: 5 mmHg
LVOTD: 20 mm
LVOTPV: 116 cm/s
LVOTSV: 88 mL
Lateral S' vel: 12.1 cm/s
MV pk E vel: 96.4 m/s
MVPG: 4 mmHg
MVPKAVEL: 77.6 m/s
PW: 11.4 mm — AB (ref 0.6–1.1)
Simpson's disk: 63
Stroke v: 43 ml
TAPSE: 27.2 mm
TDI e' medial: 6.09
WEIGHTICAEL: 2993.6 [oz_av]

## 2017-05-15 LAB — TROPONIN I: Troponin I: <0.03 ng/mL (ref <0.03)

## 2017-05-15 MED ORDER — MORPHINE SULFATE (PF) 2 MG/ML IV SOLN
2.0000 mg | Freq: Once | INTRAVENOUS | Status: AC
  Administered 2017-05-15: 2 mg via INTRAVENOUS
  Filled 2017-05-15: qty 1

## 2017-05-15 MED ORDER — FLECAINIDE ACETATE 100 MG PO TABS
ORAL_TABLET | ORAL | Status: AC
  Filled 2017-05-15: qty 1

## 2017-05-15 MED ORDER — FLECAINIDE ACETATE 50 MG PO TABS
50.0000 mg | ORAL_TABLET | Freq: Two times a day (BID) | ORAL | Status: DC
  Administered 2017-05-15 – 2017-05-16 (×5): 50 mg via ORAL
  Filled 2017-05-15 (×7): qty 1

## 2017-05-15 MED ORDER — NITROGLYCERIN 0.4 MG SL SUBL
SUBLINGUAL_TABLET | SUBLINGUAL | Status: AC
  Administered 2017-05-15: 0.4 mg
  Filled 2017-05-15: qty 1

## 2017-05-15 MED ORDER — METOPROLOL TARTRATE 25 MG/10 ML ORAL SUSPENSION
6.2500 mg | Freq: Every day | ORAL | Status: DC
  Filled 2017-05-15 (×3): qty 2.5

## 2017-05-15 MED ORDER — ASPIRIN EC 325 MG PO TBEC
325.0000 mg | DELAYED_RELEASE_TABLET | Freq: Every day | ORAL | Status: DC
  Filled 2017-05-15: qty 1

## 2017-05-15 MED ORDER — METOPROLOL TARTRATE 25 MG PO TABS: 6.2500 mg | ORAL_TABLET | Freq: Every day | ORAL | Status: DC

## 2017-05-15 MED ORDER — ACETAMINOPHEN 325 MG PO TABS
650.0000 mg | ORAL_TABLET | ORAL | Status: DC | PRN
Start: 2017-05-15 — End: 2017-05-16

## 2017-05-15 MED ORDER — GI COCKTAIL ~~LOC~~
30.0000 mL | Freq: Four times a day (QID) | ORAL | Status: DC | PRN
  Administered 2017-05-15: 30 mL via ORAL
  Filled 2017-05-15 (×2): qty 30

## 2017-05-15 MED ORDER — ISOSORBIDE MONONITRATE ER 30 MG PO TB24
15.0000 mg | ORAL_TABLET | Freq: Every day | ORAL | Status: DC
  Administered 2017-05-15 – 2017-05-16 (×2): 15 mg via ORAL
  Filled 2017-05-15 (×2): qty 1

## 2017-05-15 MED ORDER — ONDANSETRON HCL 4 MG/2ML IJ SOLN: 4.0000 mg | Freq: Four times a day (QID) | INTRAMUSCULAR | Status: DC | PRN

## 2017-05-15 MED ORDER — NITROGLYCERIN 2 % TD OINT
1.0000 [in_us] | TOPICAL_OINTMENT | Freq: Four times a day (QID) | TRANSDERMAL | Status: DC
  Administered 2017-05-15 – 2017-05-16 (×3): 1 [in_us] via TOPICAL
  Filled 2017-05-15 (×3): qty 1

## 2017-05-15 MED ORDER — NITROGLYCERIN 0.4 MG SL SUBL
0.4000 mg | SUBLINGUAL_TABLET | SUBLINGUAL | Status: DC | PRN
  Administered 2017-05-15 (×4): 0.4 mg via SUBLINGUAL
  Filled 2017-05-15: qty 1

## 2017-05-15 MED ORDER — ASPIRIN 81 MG PO CHEW
81.0000 mg | CHEWABLE_TABLET | Freq: Every day | ORAL | Status: DC
  Filled 2017-05-15: qty 1

## 2017-05-15 MED ORDER — PRAVASTATIN SODIUM 40 MG PO TABS
20.0000 mg | ORAL_TABLET | ORAL | Status: DC
  Administered 2017-05-15: 20 mg via ORAL
  Filled 2017-05-15: qty 2

## 2017-05-15 MED ORDER — NITROGLYCERIN 0.4 MG SL SUBL
SUBLINGUAL_TABLET | SUBLINGUAL | Status: AC
  Filled 2017-05-15: qty 1

## 2017-05-15 MED ORDER — APIXABAN 5 MG PO TABS
5.0000 mg | ORAL_TABLET | Freq: Two times a day (BID) | ORAL | Status: DC
  Administered 2017-05-15 (×2): 5 mg via ORAL
  Filled 2017-05-15 (×2): qty 1

## 2017-05-15 NOTE — H&P (Signed)
History and Physical    Sean Miranda:[id number] DOB: 12-06-1929 DOA: 05/14/2017  PCP: Marletta Lor, MD  Patient coming from: home  Chief Complaint: chest pain  HPI: Sean Miranda is a 83 y.o. male with medical history significant of nonobst CAD, CAS, HTN, afib on eloquis comes in with anginal symptoms.  Pt reports at rest he was haivng sscp without any radiation with no associate n/v/sob.  The pain was relieved with NTG sl in the ED and morphine.  Was gone for several hours.  He has no h/o heartburn.  His pain is starting to come back now.  Says it feels like "someone sitting on his chest".  Pt has recent stress test last month with dr hilty which was neg for ischemia.  He also has had several attempts for cardioversion for his afib which have been unsuccessful and for which he is on eloquis for.  He deniees any sob.  No cough.  No fevers.  Pt has been given gi cocktail and morphine and it has relieved his cp again.  Pt referred for admission for possible ACS.   Review of Systems: As per HPI otherwise 10 point review of systems negative.   Past Medical History:  Diagnosis Date  . Arthritis    neck   . Atrial fibrillation ([place])    a. new-onset in 01/2017. Started on Eliquis  . Calcification of coronary artery    a. mild, nonobstructive CAD by cath in 2009.  Marland Kitchen Cancer of skin of chest   . Carotid artery occlusion    a. s/p R CEA in 2014  . Colon polyps   . Hyperlipidemia    takes Pravastatin every other day  . Pneumonia ~ 01/2013    Past Surgical History:  Procedure Laterality Date  . CARDIAC CATHETERIZATION  06/09/1999   mild CAD (LAD, diagonal, RCA) - Dr. Loni Muse. [place]  . CARDIOVERSION N/A 03/25/2017   Procedure: CARDIOVERSION;  Surgeon: Pixie Casino, MD;  Location: [facility] ENDOSCOPY;  Service: Cardiovascular;  Laterality: N/A;  . CAROTID DOPPLER  02/2012   60-79% RICA stenosis, 67-34% LICA stenosis (prior to carotid endarterectomy)  . COLONOSCOPY    . ENDARTERECTOMY  Right 11/10/2013   Procedure: ENDARTERECTOMY CAROTID;  Surgeon: Conrad , MD;  Location: [place];  Service: Vascular;  Laterality: Right;  . INGUINAL HERNIA REPAIR Left   . MOLE REMOVAL  1990s   "chest"  . NM MYOCAR PERF WALL MOTION  04/2011   bruce myoview - perfusion defect in inferior myocardium (diaphragmatic attenuation), remaining myocardium with normal perfusion, EF 67%  . PATCH ANGIOPLASTY Right 11/10/2013   Procedure: PATCH ANGIOPLASTY;  Surgeon: Conrad , MD;  Location: [place];  Service: Vascular;  Laterality: Right;  . PILONIDAL CYST DRAINAGE    . SHOULDER OPEN ROTATOR CUFF REPAIR Bilateral 1998 - 2001  . SKIN CANCER EXCISION  ~ 2015   "chest"  . TONSILLECTOMY       reports that he quit smoking about 44 years ago. His smoking use included Cigarettes. He has a 18.00 pack-year smoking history. He has never used smokeless tobacco. He reports that he drinks about 8.4 oz of alcohol per week . He reports that he does not use drugs.  Allergies  Allergen Reactions  . Lipitor [Atorvastatin Calcium] Other (See Comments)    Muscle pain  . Vantin [Cefpodoxime] Rash    Family History  Problem Relation Age of Onset  . Stroke Father   . Hypertension Mother   .  Prostate cancer Son   . Stroke Brother   . Cancer Brother        spine    Prior to Admission medications   Medication Sig Start Date End Date Taking? Authorizing Provider  ALPHA LIPOIC ACID PO Take 300 mg by mouth daily.   Yes [provider]  amoxicillin (AMOXIL) 500 MG capsule Take 1 capsule (500 mg total) by mouth 2 (two) times daily. 05/09/17  Yes Lucretia Kern, DO  apixaban (ELIQUIS) 5 MG TABS tablet Take 1 tablet (5 mg total) by mouth 2 (two) times daily. 02/22/17  Yes Bhagat, Bhavinkumar, PA  Cholecalciferol (VITAMIN D3) 2000 UNITS capsule Take 2,000 Units by mouth daily.    Yes [provider]  Coenzyme Q10-Levocarnitine 100-20 MG CAPS Take 1 capsule by mouth daily.    Yes [provider]    doxycycline (VIBRA-TABS) 100 MG tablet Take 1 tablet (100 mg total) by mouth 2 (two) times daily. 05/09/17  Yes Lucretia Kern, DO  flecainide (TAMBOCOR) 50 MG tablet Take 1 tablet (50 mg total) by mouth 2 (two) times daily. 05/02/17  Yes Croitoru, Mihai, MD  Glucosamine-Chondroitin 500-400 MG CAPS Take 1 capsule by mouth daily.    Yes [provider]  hydrocortisone (ANUSOL-HC) 2.5 % rectal cream Place 1 application rectally 2 (two) times daily. Patient taking differently: Place 1 application rectally 2 (two) times daily as needed for itching.  10/08/16  Yes Colin Benton R, DO  Magnesium 500 MG TABS Take 1 tablet by mouth daily.   Yes [provider]  metoprolol tartrate (LOPRESSOR) 25 MG tablet Take 0.5 tablets (12.5 mg total) by mouth 2 (two) times daily. Patient taking differently: Take 6.25 mg by mouth daily.  02/22/17  Yes Bhagat, Bhavinkumar, PA  Multiple Vitamin (MULTIVITAMIN) tablet Take 0.5 tablets by mouth daily.    Yes [provider]  OMEGA-3 1000 MG CAPS Take 4,000 mg by mouth daily.   Yes [provider]  pravastatin (PRAVACHOL) 40 MG tablet TAKE 1TABLET BY MOUTH AT BEDTIME. Patient taking differently: Take 20 mg by mouth every Monday, Wednesday, and Friday.  12/21/16  Yes Marletta Lor, MD  Probiotic Product (PROBIOTIC DAILY PO) Take 1 capsule by mouth daily.   Yes [provider]  sildenafil (REVATIO) 20 MG tablet TAKE 2-3 TABLETS DAILY AS NEEDED FOR ERECTILE DYSFUNCTION. 11/20/16  Yes Marletta Lor, MD    Physical Exam: Vitals:   05/14/17 2203 05/14/17 2204 05/14/17 2205 05/14/17 2206  BP: (!) 156/115 (!) 153/101 (!) 149/96 (!) 143/80  Pulse: (!) 59 (!) 58 (!) 59 61  Resp: 16 15 14 16   Temp:      SpO2: 99% 98% 98% 98%  Weight:      Height:          Constitutional: NAD, calm, comfortable Vitals:   05/14/17 2203 05/14/17 2204 05/14/17 2205 05/14/17 2206  BP: (!) 156/115 (!) 153/101 (!) 149/96 (!) 143/80  Pulse: (!)  59 (!) 58 (!) 59 61  Resp: 16 15 14 16   Temp:      SpO2: 99% 98% 98% 98%  Weight:      Height:       Eyes: PERRL, lids and conjunctivae normal ENMT: Mucous membranes are moist. Posterior pharynx clear of any exudate or lesions.Normal dentition.  Neck: normal, supple, no masses, no thyromegaly Respiratory: clear to auscultation bilaterally, no wheezing, no crackles. Normal respiratory effort. No accessory muscle use.  Cardiovascular: Regular rate and rhythm, no  murmurs / rubs / gallops. No extremity edema. 2+ pedal pulses. No carotid bruits.  Abdomen: no tenderness, no masses palpated. No hepatosplenomegaly. Bowel sounds positive.  Musculoskeletal: no clubbing / cyanosis. No joint deformity upper and lower extremities. Good ROM, no contractures. Normal muscle tone.  Skin: no rashes, lesions, ulcers. No induration Neurologic: CN 2-12 grossly intact. Sensation intact, DTR normal. Strength 5/5 in all 4.  Psychiatric: Normal judgment and insight. Alert and oriented x 3. Normal mood.    Labs on Admission: I have personally reviewed following labs and imaging studies  CBC:  Recent Labs Lab 05/14/17 2203  WBC 5.3  NEUTROABS 2.6  HGB 14.7  HCT 41.5  MCV 89.8  PLT 503   Basic Metabolic Panel:  Recent Labs Lab 05/14/17 2203  NA 134*  K 3.8  CL 101  CO2 24  GLUCOSE 106*  BUN 16  CREATININE 1.06  CALCIUM 9.1   GFR: Estimated Creatinine Clearance: 55.9 mL/min (by C-G formula based on SCr of 1.06 mg/dL). Liver Function Tests:  Recent Labs Lab 05/14/17 2203  AST 23  ALT 30  ALKPHOS 65  BILITOT 0.7  PROT 6.9  ALBUMIN 3.8    Cardiac Enzymes:  Recent Labs Lab 05/14/17 2203  TROPONINI <0.03    Radiological Exams on Admission: Dg Chest 2 View  Result Date: 05/14/2017 CLINICAL DATA:  Left-sided chest pain EXAM: CHEST  2 VIEW COMPARISON:  02/21/2017 FINDINGS: Linear scarring or atelectasis at both lung bases. No focal consolidation or pleural effusion.  Cardiomediastinal silhouette within normal limits with atherosclerosis. No pneumothorax. Postsurgical changes of the left humeral head IMPRESSION: Linear scarring or atelectasis at the lung bases. No acute infiltrate or edema. Electronically Signed   By: Donavan Foil M.D.   On: 05/14/2017 23:32    EKG: Independently reviewed. Sinus brady rate of 58 with RBBB old c/w to old ekgs cxr reviewed no edema or infiltrate Old chart reviewed Case discussed with dr zammit  Assessment/Plan 83 yo male with recent neg stress test comes in with chest pain Principal Problem:   Chest pain- pt on eloquis.  Trop neg.  Serial trop and obtain echo in am.  Cont ntg paste.  Recent neg stress test last month.  Consider cards call to see if cath would be considered.  Active Problems:   Hyperlipidemia- cont statin   Carotid artery disease (HCC)- noted   Essential hypertension- stable   Atrial fibrillation ([place])- on eloquis , paraxoysmal   DVT prophylaxis: eloquis Code Status:  full Family Communication: wife  Disposition Plan:  Per day team  Consults called:  none Admission status:  observation    Blayne Garlick A MD [facility]  If 7PM-7AM, please contact night-coverage [email or web address] Password [email or web address]  05/15/2017, 12:01 AM

## 2017-05-15 NOTE — Consult Note (Signed)
Cardiology Consultation:   Patient ID: Sean Miranda; [id number]; 1930/06/26   Admit date: 05/14/2017 Date of Consult: 05/15/2017  Primary Care Provider: Marletta Lor, MD Primary Cardiologist: [facility]   Patient Profile:   Sean Miranda is a 83 y.o. male with a hx of persistent atrial fibrillation who is being seen today for the evaluation of chest pain at the request of Dr. Raelene Bott and Dr. Roderic Palau.  History of Present Illness:   Sean Miranda is an 83 year old male with a history of persistent atrial fibrillation who failed cardioversion earlier this year and was started on flecainide.  He had been having some exertional chest pain and underwent a normal nuclear stress test on 04/03/17.  Echocardiogram 02/22/17 demonstrated normal left ventricular systolic function and regional wall motion, LVEF 65-70%.  He was watching TV at about 9:00 last night and developed "severe chest pain ". It radiated down his left arm. He did not have associated shortness of breath.  He presented to the ED and was given nitroglycerin paste and IV morphine and the pain has since resolved. He has been walking around his room without symptoms.  He says he does not like taking medications and that something is making him feel weak and tired. He currently takes metoprolol tartrate 6.25 mg daily and flecainide 50 mg twice daily.  Troponins have been normal.  Chest x-ray showed linear scarring or atelectasis at the bases with no acute infiltrate or edema.  I personally reviewed the ECG performed yesterday in the ED which demonstrated sinus rhythm with right bundle branch block and left posterior fascicular block.  I personally reviewed the ECG performed today which demonstrated sinus bradycardia and sinus arrhythmia with right bundle-branch block.    Past Medical History:  Diagnosis Date  . Arthritis    neck   . Atrial fibrillation ([place])    a. new-onset in 01/2017. Started on Eliquis  .  Calcification of coronary artery    a. mild, nonobstructive CAD by cath in 2009.  Marland Kitchen Cancer of skin of chest   . Carotid artery occlusion    a. s/p R CEA in 2014  . Colon polyps   . Hyperlipidemia    takes Pravastatin every other day  . Pneumonia ~ 01/2013    Past Surgical History:  Procedure Laterality Date  . CARDIAC CATHETERIZATION  06/09/1999   mild CAD (LAD, diagonal, RCA) - Dr. Loni Muse. Little  . CARDIOVERSION N/A 03/25/2017   Procedure: CARDIOVERSION;  Surgeon: Pixie Casino, MD;  Location: [facility] ENDOSCOPY;  Service: Cardiovascular;  Laterality: N/A;  . CAROTID DOPPLER  02/2012   60-79% RICA stenosis, 03-50% LICA stenosis (prior to carotid endarterectomy)  . COLONOSCOPY    . ENDARTERECTOMY Right 11/10/2013   Procedure: ENDARTERECTOMY CAROTID;  Surgeon: Conrad Sac, MD;  Location: [place];  Service: Vascular;  Laterality: Right;  . INGUINAL HERNIA REPAIR Left   . MOLE REMOVAL  1990s   "chest"  . NM MYOCAR PERF WALL MOTION  04/2011   bruce myoview - perfusion defect in inferior myocardium (diaphragmatic attenuation), remaining myocardium with normal perfusion, EF 67%  . PATCH ANGIOPLASTY Right 11/10/2013   Procedure: PATCH ANGIOPLASTY;  Surgeon: Conrad , MD;  Location: [place];  Service: Vascular;  Laterality: Right;  . PILONIDAL CYST DRAINAGE    . SHOULDER OPEN ROTATOR CUFF REPAIR Bilateral 1998 - 2001  . SKIN CANCER EXCISION  ~ 2015   "chest"  . TONSILLECTOMY       Inpatient  Medications: Scheduled Meds: . apixaban  5 mg Oral BID  . aspirin EC  325 mg Oral Daily  . flecainide  50 mg Oral BID  . metoprolol tartrate  6.25 mg Oral Daily  . pravastatin  20 mg Oral Q M,W,F   Continuous Infusions:  PRN Meds: acetaminophen, gi cocktail, ondansetron (ZOFRAN) IV  Allergies:    Allergies  Allergen Reactions  . Lipitor [Atorvastatin Calcium] Other (See Comments)    Muscle pain  . Vantin [Cefpodoxime] Rash    Social History:   Social History   Social History  . Marital  status: Married    Spouse name: N/A  . Number of children: 3  . Years of education: N/A   Occupational History  .  [place] History Main Topics  . Smoking status: Former Smoker    Packs/day: 1.00    Years: 18.00    Types: Cigarettes    Quit date: 12/03/1972  . Smokeless tobacco: Never Used  . Alcohol use 8.4 oz/week    7 Glasses of wine, 7 Shots of liquor per week  . Drug use: No  . Sexual activity: Yes   Other Topics Concern  . Not on file   Social History Narrative  . No narrative on file    Family History:   The patient's family history includes Cancer in his brother; Hypertension in his mother; Prostate cancer in his son; Stroke in his brother and father.  ROS:  Please see the history of present illness.  ROS  All other ROS reviewed and negative.     Physical Exam/Data:   Vitals:   05/15/17 0051 05/15/17 0400 05/15/17 0810 05/15/17 1100  BP: (!) 150/86 (!) 107/52 131/66 121/63  Pulse:  (!) 53 (!) 53 (!) 55  Resp:  20 19 20   Temp:  97.6 F (36.4 C) 97.5 F (36.4 C) 98 F (36.7 C)  TempSrc:  Oral Oral Oral  SpO2:  96% 96% 97%  Weight:      Height:        Intake/Output Summary (Last 24 hours) at 05/15/17 1144 Last data filed at 05/15/17 1133  Gross per 24 hour  Intake              550 ml  Output              650 ml  Net             -100 ml   Filed Weights   05/14/17 2125 05/15/17 0046  Weight: 181 lb (82.1 kg) 187 lb 1.6 oz (84.9 kg)   Body mass index is 25.38 kg/m.  General:  Well nourished, well developed, in no acute distress HEENT: normal Lymph: no adenopathy Neck: no JVD Endocrine:  No thryomegaly Cardiac:  normal S1, S2; RRR; no murmur  Lungs:  clear to auscultation bilaterally, no wheezing, rhonchi or rales  Abd: soft, nontender, no hepatomegaly  Ext: no edema Musculoskeletal:  No deformities, BUE and BLE strength normal and equal Skin: warm and dry  Neuro:  CNs 2-12 intact, no focal abnormalities noted Psych:  Normal  affect     Relevant CV Studies: Recently ordered echocardiogram (pending)  Echo 02/22/17: - Left ventricle: The cavity size was normal. Systolic function was   vigorous. The estimated ejection fraction was in the range of 65%   to 70%. Wall motion was normal; there were no regional wall   motion abnormalities. The study was not technically sufficient to  allow evaluation of LV diastolic dysfunction due to atrial   fibrillation. - Aortic valve: Trileaflet; normal thickness leaflets. There was   trivial regurgitation. Valve area (VTI): 1.86 cm^2. Valve area   (Vmax): 1.87 cm^2. Valve area (Vmean): 1.83 cm^2. - Aortic root: The aortic root was normal in size. - Mitral valve: There was no regurgitation. - Left atrium: The atrium was normal in size. - Right ventricle: Systolic function was normal. - Tricuspid valve: There was trivial regurgitation. - Pulmonic valve: There was no regurgitation. - Pulmonary arteries: Systolic pressure was within the normal   range. - Inferior vena cava: The vessel was normal in size. - Pericardium, extracardiac: There was no pericardial effusion.  Nuclear stress test 04/03/17:  Blood pressure demonstrated a normal response to exercise.  There was no ST segment deviation noted during stress.  The study is normal.  This is a low risk study.   Normal perfusion no ischemia or infarct Not gated due to atrial fibrillation     Laboratory Data:  Chemistry Recent Labs Lab 05/14/17 2203  NA 134*  K 3.8  CL 101  CO2 24  GLUCOSE 106*  BUN 16  CREATININE 1.06  CALCIUM 9.1  GFRNONAA >60  GFRAA >60  ANIONGAP 9     Recent Labs Lab 05/14/17 2203  PROT 6.9  ALBUMIN 3.8  AST 23  ALT 30  ALKPHOS 65  BILITOT 0.7   Hematology Recent Labs Lab 05/14/17 2203  WBC 5.3  RBC 4.62  HGB 14.7  HCT 41.5  MCV 89.8  MCH 31.8  MCHC 35.4  RDW 13.1  PLT 194   Cardiac Enzymes Recent Labs Lab 05/14/17 2203 05/15/17 0110 05/15/17 0353  05/15/17 0620  TROPONINI <0.03 <0.03 <0.03 <0.03   No results for input(s): TROPIPOC in the last 168 hours.  BNPNo results for input(s): BNP, PROBNP in the last 168 hours.  DDimer No results for input(s): DDIMER in the last 168 hours.  Radiology/Studies:  Dg Chest 2 View  Result Date: 05/14/2017 CLINICAL DATA:  Left-sided chest pain EXAM: CHEST  2 VIEW COMPARISON:  02/21/2017 FINDINGS: Linear scarring or atelectasis at both lung bases. No focal consolidation or pleural effusion. Cardiomediastinal silhouette within normal limits with atherosclerosis. No pneumothorax. Postsurgical changes of the left humeral head IMPRESSION: Linear scarring or atelectasis at the lung bases. No acute infiltrate or edema. Electronically Signed   By: Donavan Foil M.D.   On: 05/14/2017 23:32    Assessment and Plan:   1. Chest pain: While sudden onset of severe chest pain is concerning for ischemic heart disease, he had a normal stress test on 04/03/17. Left reticular systolic function and regional wall motion were normal by echocardiogram on 02/22/17. An echocardiogram is pending. If there has been a decrease in left ventricular function and/or no regional wall motion abnormalities, I would then consider coronary angiography. For the time being, continue pravastatin and flecainide. Given his weakness I would simply stop metoprolol as he is only taking 6.25 mg daily which is likely not providing any benefit, and bradycardia may be contributed to this. I would also recommend reducing aspirin to 81 mg daily and this should be discontinued if coronary artery disease is excluded.  2. Carotid artery disease: Dopplers earlier this year demonstrated patent right carotid endarterectomy and less than 40% left internal carotid artery stenosis.  3. Hyperlipidemia: Continue pravastatin.  4. Persistent atrial fibrillation: Currently in sinus rhythm. Continue anticoagulation with Eliquis. Continue flecainide 50 mg twice daily. If  coronary  artery disease is indeed found, this will need to be discontinued. Given his weakness I would simply stop metoprolol as he is only taking 6.25 mg daily which is likely not providing any benefit, and bradycardia may be contributed to this.   Signed, Kate Sable, MD  05/15/2017 11:44 AM

## 2017-05-15 NOTE — Progress Notes (Signed)
Patient had episode of chest pain at 2130, relieved by sublingual nitro x 1.  Patient vital signs were stable, and chest pain was relieved within minutes of administration.  At this time patient resting comfortably with no complaints at this time.

## 2017-05-15 NOTE — Progress Notes (Signed)
*  PRELIMINARY RESULTS* Echocardiogram 2D Echocardiogram has been performed.  Sean Miranda 05/15/2017, 3:02 PM

## 2017-05-15 NOTE — Progress Notes (Signed)
**Note De-Identified  Obfuscation** EKG complete RN notified

## 2017-05-15 NOTE — Progress Notes (Signed)
Patient admitted to the hospital earlier this morning by Dr. Shanon Brow  Patient seen and examined. Reports that chest pain improved with sublingual nitroglycerin. He has been having recurrent episodes since admission to the hospital. Physical exam is otherwise unremarkable.    Patient was admitted to the hospital with substernal chest discomfort. Echocardiogram noted to be unremarkable. Discussed with Dr. Harl Bowie who recommended starting the patient on Imdur. He will be made NPO after midnight in case cardiac catheterization is felt necessary tomorrow. We'll hold anticoagulation for now. Metoprolol discontinued by Dr. Bronson Ing earlier today.  MEMON,JEHANZEB

## 2017-05-15 NOTE — ED Notes (Signed)
Pt states that he is having stabbing left sided chest pains. Dr notified of this pt given 1 sl ntg and 2mg  of morphine per orders. Pt's pain went away after about 10 minutes.

## 2017-05-15 NOTE — Care Management Obs Status (Signed)
[place] NOTIFICATION   Patient Details  Name: Sean Miranda MRN: [id number] Date of Birth: [birth year]   Medicare Observation Status Notification Given:  Yes    Sherald Barge, RN 05/15/2017, 10:33 AM

## 2017-05-15 NOTE — Progress Notes (Signed)
Patient discussed with Dr Roderic Palau, recurrent episodes of chest pain today resolved with SL NG. No ischemia on nuclear stress 04/2017, normal echo. We will start imdur 15mg  daily as antianginal and monitor symptoms. Make NPO tonight and hold eliquis in case cath is needed tomorrow. Defer decision on cath to primary rounding team tomorrow  Zandra Abts MD

## 2017-05-15 NOTE — Care Management Note (Signed)
Case Management Note  Patient Details  Name: Sean Miranda MRN: [id number] Date of Birth: 05/24/1930  Subjective/Objective:                  Admitted with R/O CP. Pt from home, lives with spouse and is ind with ADL's. She as PCP, transportation and insurance with drug coverage. He has no HH or DME needs pta. He plans to DC home with self care, communicates no needs.   Action/Plan: Plan for DC home with self care. CM will follow to DC.   Expected Discharge Date:     05/16/2017             Expected Discharge Plan:  Home/Self Care  In-House Referral:  NA  Discharge planning Services  CM Consult  Post Acute Care Choice:  NA Choice offered to:  NA  Status of Service:  Completed, signed off   Sherald Barge, RN 05/15/2017, 10:34 AM

## 2017-05-16 ENCOUNTER — Other Ambulatory Visit: Payer: Self-pay

## 2017-05-16 ENCOUNTER — Encounter (HOSPITAL_COMMUNITY)
Admission: EM | Disposition: A | Discharge status: Home / Self Care | Payer: Self-pay | Source: Home / Self Care | Attending: Internal Medicine

## 2017-05-16 DIAGNOSIS — Z79899 Other long term (current) drug therapy: Secondary | ICD-10-CM | POA: Diagnosis not present

## 2017-05-16 DIAGNOSIS — I25110 Atherosclerotic heart disease of native coronary artery with unstable angina pectoris: Principal | ICD-10-CM

## 2017-05-16 DIAGNOSIS — Z7982 Long term (current) use of aspirin: Secondary | ICD-10-CM | POA: Diagnosis not present

## 2017-05-16 DIAGNOSIS — I2510 Atherosclerotic heart disease of native coronary artery without angina pectoris: Secondary | ICD-10-CM

## 2017-05-16 DIAGNOSIS — I10 Essential (primary) hypertension: Secondary | ICD-10-CM | POA: Diagnosis not present

## 2017-05-16 DIAGNOSIS — I481 Persistent atrial fibrillation: Secondary | ICD-10-CM | POA: Diagnosis not present

## 2017-05-16 DIAGNOSIS — I779 Disorder of arteries and arterioles, unspecified: Secondary | ICD-10-CM

## 2017-05-16 DIAGNOSIS — Z888 Allergy status to other drugs, medicaments and biological substances status: Secondary | ICD-10-CM | POA: Diagnosis not present

## 2017-05-16 DIAGNOSIS — I208 Other forms of angina pectoris: Secondary | ICD-10-CM

## 2017-05-16 DIAGNOSIS — Z7901 Long term (current) use of anticoagulants: Secondary | ICD-10-CM | POA: Diagnosis not present

## 2017-05-16 DIAGNOSIS — I200 Unstable angina: Secondary | ICD-10-CM | POA: Diagnosis present

## 2017-05-16 DIAGNOSIS — Z955 Presence of coronary angioplasty implant and graft: Secondary | ICD-10-CM | POA: Diagnosis not present

## 2017-05-16 DIAGNOSIS — E785 Hyperlipidemia, unspecified: Secondary | ICD-10-CM | POA: Diagnosis not present

## 2017-05-16 DIAGNOSIS — R079 Chest pain, unspecified: Secondary | ICD-10-CM | POA: Diagnosis not present

## 2017-05-16 HISTORY — PX: CORONARY STENT INTERVENTION: CATH118234

## 2017-05-16 HISTORY — PX: LEFT HEART CATH AND CORONARY ANGIOGRAPHY: CATH118249

## 2017-05-16 HISTORY — DX: Atherosclerotic heart disease of native coronary artery without angina pectoris: I25.10

## 2017-05-16 LAB — POCT ACTIVATED CLOTTING TIME: Activated Clotting Time: 433 seconds

## 2017-05-16 LAB — TROPONIN I

## 2017-05-16 SURGERY — LEFT HEART CATH AND CORONARY ANGIOGRAPHY
Anesthesia: LOCAL

## 2017-05-16 MED ORDER — LABETALOL HCL 5 MG/ML IV SOLN: 10.0000 mg | INTRAVENOUS | Status: AC | PRN

## 2017-05-16 MED ORDER — SODIUM CHLORIDE 0.9 % IV SOLN: 250.0000 mL | INTRAVENOUS | Status: DC | PRN

## 2017-05-16 MED ORDER — FENTANYL CITRATE (PF) 100 MCG/2ML IJ SOLN
INTRAMUSCULAR | Status: AC
  Filled 2017-05-16: qty 2

## 2017-05-16 MED ORDER — HYDRALAZINE HCL 20 MG/ML IJ SOLN
5.0000 mg | INTRAMUSCULAR | Status: AC | PRN
  Administered 2017-05-16: 18:00:00 5 mg via INTRAVENOUS
  Filled 2017-05-16: qty 1

## 2017-05-16 MED ORDER — LIDOCAINE HCL 1 % IJ SOLN
INTRAMUSCULAR | Status: AC
  Filled 2017-05-16: qty 20

## 2017-05-16 MED ORDER — MIDAZOLAM HCL 2 MG/2ML IJ SOLN
INTRAMUSCULAR | Status: AC
  Filled 2017-05-16: qty 2

## 2017-05-16 MED ORDER — HEPARIN (PORCINE) IN NACL 2-0.9 UNIT/ML-% IJ SOLN
INTRAMUSCULAR | Status: AC
  Filled 2017-05-16: qty 1000

## 2017-05-16 MED ORDER — IOPAMIDOL (ISOVUE-370) INJECTION 76%
INTRAVENOUS | Status: DC | PRN
  Administered 2017-05-16: 190 mL via INTRA_ARTERIAL

## 2017-05-16 MED ORDER — BIVALIRUDIN TRIFLUOROACETATE 250 MG IV SOLR
INTRAVENOUS | Status: AC
  Filled 2017-05-16: qty 250

## 2017-05-16 MED ORDER — SODIUM CHLORIDE 0.9 % IV SOLN: INTRAVENOUS | Status: AC

## 2017-05-16 MED ORDER — HEPARIN SODIUM (PORCINE) 1000 UNIT/ML IJ SOLN
INTRAMUSCULAR | Status: DC | PRN
  Administered 2017-05-16: 4500 [IU] via INTRAVENOUS

## 2017-05-16 MED ORDER — ACETAMINOPHEN 325 MG PO TABS: 650.0000 mg | ORAL_TABLET | ORAL | Status: DC | PRN

## 2017-05-16 MED ORDER — FENTANYL CITRATE (PF) 100 MCG/2ML IJ SOLN
INTRAMUSCULAR | Status: DC | PRN
  Administered 2017-05-16 (×3): 25 ug via INTRAVENOUS

## 2017-05-16 MED ORDER — METOPROLOL TARTRATE 5 MG/5ML IV SOLN
INTRAVENOUS | Status: DC | PRN
  Administered 2017-05-16: 5 mg via INTRAVENOUS

## 2017-05-16 MED ORDER — IOPAMIDOL (ISOVUE-370) INJECTION 76%
INTRAVENOUS | Status: AC
Start: 2017-05-16 — End: ?
  Filled 2017-05-16: qty 100

## 2017-05-16 MED ORDER — BIVALIRUDIN BOLUS VIA INFUSION - CUPID
INTRAVENOUS | Status: DC | PRN
  Administered 2017-05-16: 63.675 mg via INTRAVENOUS

## 2017-05-16 MED ORDER — MIDAZOLAM HCL 2 MG/2ML IJ SOLN
INTRAMUSCULAR | Status: DC | PRN
  Administered 2017-05-16 (×3): 1 mg via INTRAVENOUS

## 2017-05-16 MED ORDER — METOPROLOL TARTRATE 5 MG/5ML IV SOLN
INTRAVENOUS | Status: AC
  Filled 2017-05-16: qty 5

## 2017-05-16 MED ORDER — CLOPIDOGREL BISULFATE 300 MG PO TABS
ORAL_TABLET | ORAL | Status: DC | PRN
  Administered 2017-05-16: 600 mg via ORAL

## 2017-05-16 MED ORDER — IOPAMIDOL (ISOVUE-370) INJECTION 76%
INTRAVENOUS | Status: AC
  Filled 2017-05-16: qty 50

## 2017-05-16 MED ORDER — CLOPIDOGREL BISULFATE 75 MG PO TABS: 75.0000 mg | ORAL_TABLET | Freq: Every day | ORAL | Status: DC

## 2017-05-16 MED ORDER — SODIUM CHLORIDE 0.9% FLUSH: 3.0000 mL | INTRAVENOUS | Status: DC | PRN

## 2017-05-16 MED ORDER — CLOPIDOGREL BISULFATE 300 MG PO TABS
ORAL_TABLET | ORAL | Status: AC
  Filled 2017-05-16: qty 2

## 2017-05-16 MED ORDER — IOPAMIDOL (ISOVUE-370) INJECTION 76%
INTRAVENOUS | Status: AC
  Filled 2017-05-16: qty 100

## 2017-05-16 MED ORDER — HEPARIN SODIUM (PORCINE) 1000 UNIT/ML IJ SOLN
INTRAMUSCULAR | Status: AC
  Filled 2017-05-16: qty 1

## 2017-05-16 MED ORDER — SODIUM CHLORIDE 0.9 % IV SOLN
INTRAVENOUS | Status: AC | PRN
  Administered 2017-05-16 (×2): 1.75 mg/kg/h via INTRAVENOUS

## 2017-05-16 MED ORDER — BIVALIRUDIN TRIFLUOROACETATE 250 MG IV SOLR
INTRAVENOUS | Status: AC
Start: 2017-05-16 — End: ?
  Filled 2017-05-16: qty 250

## 2017-05-16 MED ORDER — LIDOCAINE HCL (PF) 1 % IJ SOLN
INTRAMUSCULAR | Status: DC | PRN
  Administered 2017-05-16: 2 mL

## 2017-05-16 MED ORDER — ONDANSETRON HCL 4 MG/2ML IJ SOLN: 4.0000 mg | Freq: Four times a day (QID) | INTRAMUSCULAR | Status: DC | PRN

## 2017-05-16 MED ORDER — NITROGLYCERIN 1 MG/10 ML FOR IR/CATH LAB
INTRA_ARTERIAL | Status: DC | PRN
  Administered 2017-05-16: 200 ug via INTRA_ARTERIAL
  Administered 2017-05-16: 200 ug via INTRACORONARY

## 2017-05-16 MED ORDER — VERAPAMIL HCL 2.5 MG/ML IV SOLN
INTRAVENOUS | Status: DC | PRN
  Administered 2017-05-16: 10 mL via INTRA_ARTERIAL

## 2017-05-16 MED ORDER — VERAPAMIL HCL 2.5 MG/ML IV SOLN
INTRAVENOUS | Status: AC
  Filled 2017-05-16: qty 2

## 2017-05-16 MED ORDER — ASPIRIN 81 MG PO CHEW: 81.0000 mg | CHEWABLE_TABLET | Freq: Every day | ORAL | Status: DC

## 2017-05-16 MED ORDER — HEPARIN (PORCINE) IN NACL 2-0.9 UNIT/ML-% IJ SOLN
INTRAMUSCULAR | Status: AC | PRN
  Administered 2017-05-16: 1000 mL

## 2017-05-16 MED ORDER — SODIUM CHLORIDE 0.9% FLUSH
3.0000 mL | Freq: Two times a day (BID) | INTRAVENOUS | Status: DC
  Administered 2017-05-16: 3 mL via INTRAVENOUS

## 2017-05-16 SURGICAL SUPPLY — 19 items
BALLN EMERGE MR 2.5X15 (BALLOONS) ×2
BALLN ~~LOC~~ EUPHORA RX 3.0X12 (BALLOONS) ×2
BALLOON EMERGE MR 2.5X15 (BALLOONS) IMPLANT
BALLOON ~~LOC~~ EUPHORA RX 3.0X12 (BALLOONS) IMPLANT
CATH EXPO 5F FL3.5 (CATHETERS) ×1 IMPLANT
CATH INFINITI JR4 5F (CATHETERS) ×1 IMPLANT
CATH LAUNCHER 6FR EBU 3 (CATHETERS) ×1 IMPLANT
DEVICE RAD COMP TR BAND LRG (VASCULAR PRODUCTS) ×1 IMPLANT
GLIDESHEATH SLEND SS 6F .021 (SHEATH) ×1 IMPLANT
GUIDEWIRE INQWIRE 1.5J.035X260 (WIRE) IMPLANT
INQWIRE 1.5J .035X260CM (WIRE) ×2
KIT ENCORE 26 ADVANTAGE (KITS) ×1 IMPLANT
KIT HEART LEFT (KITS) ×2 IMPLANT
PACK CARDIAC CATHETERIZATION (CUSTOM PROCEDURE TRAY) ×2 IMPLANT
STENT SYNERGY DES 2.5X16 (Permanent Stent) ×1 IMPLANT
TRANSDUCER W/STOPCOCK (MISCELLANEOUS) ×2 IMPLANT
TUBING CIL FLEX 10 FLL-RA (TUBING) ×2 IMPLANT
VALVE GUARDIAN II ~~LOC~~ HEMO (MISCELLANEOUS) ×1 IMPLANT
WIRE ASAHI PROWATER 180CM (WIRE) ×1 IMPLANT

## 2017-05-16 NOTE — H&P (View-Only) (Signed)
Progress Note  Patient Name: Sean Miranda Date of Encounter: 05/16/2017  Primary Cardiologist: [facility]   Subjective   Chest pain last evening with walking in the room. None this am.   Inpatient Medications    Scheduled Meds: . aspirin  81 mg Oral Daily  . flecainide  50 mg Oral BID  . isosorbide mononitrate  15 mg Oral Daily  . nitroGLYCERIN  1 inch Topical Q6H  . pravastatin  20 mg Oral Q M,W,F   Continuous Infusions:  PRN Meds: acetaminophen, gi cocktail, nitroGLYCERIN, ondansetron (ZOFRAN) IV   Vital Signs    Vitals:   05/15/17 2100 05/15/17 2337 05/16/17 0600 05/16/17 1000  BP: (!) 116/50 (!) 123/50 (!) 104/50 131/63  Pulse: 65  60 72  Resp: 16  16 17   Temp: 98.4 F (36.9 C)  98.3 F (36.8 C) 97.6 F (36.4 C)  TempSrc: Oral  Oral Oral  SpO2: 96%  94% 95%  Weight:      Height:        Intake/Output Summary (Last 24 hours) at 05/16/17 1133 Last data filed at 05/16/17 1033  Gross per 24 hour  Intake             1030 ml  Output             1460 ml  Net             -430 ml   Filed Weights   05/14/17 2125 05/15/17 0046  Weight: 181 lb (82.1 kg) 187 lb 1.6 oz (84.9 kg)    Telemetry    Atrial fib, rates in the  70's. - Personally Reviewed  Physical Exam  Problem GEN: No acute distress.   Neck: No JVD Cardiac: RRR, 1/6 systolic murmur.  rubs, or gallops.  Respiratory: Clear to auscultation bilaterally. GI: Soft, nontender, non-distended  MS: No edema; No deformity. Neuro:  Nonfocal  Psych: Normal affect   Labs    Chemistry Recent Labs Lab 05/14/17 2203  NA 134*  K 3.8  CL 101  CO2 24  GLUCOSE 106*  BUN 16  CREATININE 1.06  CALCIUM 9.1  PROT 6.9  ALBUMIN 3.8  AST 23  ALT 30  ALKPHOS 65  BILITOT 0.7  GFRNONAA >60  GFRAA >60  ANIONGAP 9     Hematology Recent Labs Lab 05/14/17 2203  WBC 5.3  RBC 4.62  HGB 14.7  HCT 41.5  MCV 89.8  MCH 31.8  MCHC 35.4  RDW 13.1  PLT 194    Cardiac Enzymes Recent Labs Lab  05/15/17 0110 05/15/17 0353 05/15/17 0620 05/16/17 0006  TROPONINI <0.03 <0.03 <0.03 <0.03   No results for input(s): TROPIPOC in the last 168 hours.   Radiology    Dg Chest 2 View  Result Date: 05/14/2017 CLINICAL DATA:  Left-sided chest pain EXAM: CHEST  2 VIEW COMPARISON:  02/21/2017 FINDINGS: Linear scarring or atelectasis at both lung bases. No focal consolidation or pleural effusion. Cardiomediastinal silhouette within normal limits with atherosclerosis. No pneumothorax. Postsurgical changes of the left humeral head IMPRESSION: Linear scarring or atelectasis at the lung bases. No acute infiltrate or edema. Electronically Signed   By: Donavan Foil M.D.   On: 05/14/2017 23:32    Cardiac Studies  Recently ordered echocardiogram (pending)  Echo 02/22/17: - Left ventricle: The cavity size was normal. Systolic function was vigorous. The estimated ejection fraction was in the range of 65% to 70%. Wall motion was normal; there were no regional wall motion  abnormalities. The study was not technically sufficient to allow evaluation of LV diastolic dysfunction due to atrial fibrillation. - Aortic valve: Trileaflet; normal thickness leaflets. There was trivial regurgitation. Valve area (VTI): 1.86 cm^2. Valve area (Vmax): 1.87 cm^2. Valve area (Vmean): 1.83 cm^2. - Aortic root: The aortic root was normal in size. - Mitral valve: There was no regurgitation. - Left atrium: The atrium was normal in size. - Right ventricle: Systolic function was normal. - Tricuspid valve: There was trivial regurgitation. - Pulmonic valve: There was no regurgitation. - Pulmonary arteries: Systolic pressure was within the normal range. - Inferior vena cava: The vessel was normal in size. - Pericardium, extracardiac: There was no pericardial effusion.  Nuclear stress test 04/03/17:  Blood pressure demonstrated a normal response to exercise.  There was no ST segment deviation noted during  stress.  The study is normal.  This is a low risk study.  Normal perfusion no ischemia or infarct Not gated due to atrial fibrillation    Patient Profile     83 y.o. male admitted with recurrent chest pain. Normal stress test 2 years ago, persistent atrial fib on Eliquis, carotid artery disease.   Assessment & Plan    1. Chest Pain: Recurrent, relieved with NTG. Had more chest discomfort last evening described as pressure over left side of chest. No diaphoresis, mild weakness. NTG relieved, has been on isosorbide 15 mg which was started yesterday.   Had cardiac cath 2000 by Dr. Little:(Transcribed from scanned document) #1.Left main: Normal #2. LAD: The LAD extended down to the apex of the heart and was a relatively small vessel. It gave rise to one medium size diagonal vessel, and one very small second diagonal. There were proximal irregularities in the LAD of 30-40%, and an area of 50% eccentric narrowing of the proximal portion of the diagonal. #3. The circumflex had one large marginal vessels and proximal irregularities in the OM. No high-grade stenosis was noted. #4: Right coronary artery: The right coronary artery was a largest of all 3 coronary arteries. It extended down and gave rise to the PDA and the posterior lateral branch. The proximal portion had sequential irregularities of 30-40% with no high-grade stenosis.  #5: Normal LV systolic function.  Given patient's recurrent chest pain, known CAD per catheterization although 18 years ago, risk factors of carotid artery disease, hyperlipidemia, will discuss need for transfer to have cardiac catheterization for definitive evaluation of coronary anatomy, and worsening of disease over the last 18 years.  I have discussed this with the patient, verbalizes understanding, is willing to proceed and transfer to [facility] for procedure.The patient understands that risks include but are not limited to stroke (1 in 1000), death (1 in 42),  kidney failure [usually temporary] (1 in 500), bleeding (1 in 200), allergic reaction [possibly serious] (1 in 200), and agrees to proceed.   Discussed with Dr. Dr. Bronson Ing. We'll plan transfer to [facility] today for catheterization this afternoon. He will continue on aspirin, pravastatin, isosorbide. Keep nothing by mouth.  2. Carotid Artery Disease: Status post endarterectomy December 2014.  3. Atrial fibrillation: Last dose at [facility] was at 8:30 AM on 05/15/2017. Heart rate is well controlled. Continue flecainide, aspirin.  Signed, Phill Myron. [place] Pugh, ANP, AACC   05/16/2017, 11:33 AM    The patient was seen and examined, and I agree with the history, physical exam, assessment and plan as documented above, with modifications as noted below.  The patient continues to have nitrate responsive chest pain.  He required 2 nitroglycerin tablets last night. He was started on Imdur 15 mg daily due to low normal blood pressure yesterday by Dr. Harl Bowie.  Echocardiogram demonstrated normal left ventricular systolic function and regional wall motion.  He had a normal nuclear stress test on 04/03/17.  Cardiac catheterization from 2000 detailed above.  Continue aspirin, pravastatin, and Imdur. He had been taking metoprolol 6.25 mg and was bradycardic yesterday. I stopped it.  He remains in sinus rhythm.  Eliquis was held last night. He remains on flecainide.   I also spoke with Dr. Roderic Palau (hospitalist).  We will make arrangements to transfer to [facility] for coronary angiography.  If significant coronary artery disease is indeed found, flecainide will need to be discontinued.    Kate Sable, MD, [facility]  05/16/2017 12:27 PM

## 2017-05-16 NOTE — Progress Notes (Signed)
Patient c/o chest pain earlier in shift.  Notified Mid-level. Vitals stable and troponin was WNL. Received order for nitro paste.  Patient pain has been controlled on nitro paste since applied at midnight.

## 2017-05-16 NOTE — Progress Notes (Signed)
Pt being transported to [facility] for cardiac procedure via [facility]. Vitals stable. Awaiting call back to give report from RN on 3W Floor.

## 2017-05-16 NOTE — Progress Notes (Signed)
Pt has no complaints of chest pain this am. No other complaints noted either. Will continue to monitor.

## 2017-05-16 NOTE — Interval H&P Note (Signed)
Cath Lab Visit (complete for each Cath Lab visit)  Clinical Evaluation Leading to the Procedure:   ACS: Yes.    Non-ACS:    Anginal Classification: CCS IV  Anti-ischemic medical therapy: Minimal Therapy (1 class of medications)  Non-Invasive Test Results: No non-invasive testing performed  Prior CABG: No previous CABG   Last dose of Eliquis about 31 hours ago.   History and Physical Interval Note:  05/16/2017 2:45 PM  Sean Miranda  has presented today for surgery, with the diagnosis of chest pain  The various methods of treatment have been discussed with the patient and family. After consideration of risks, benefits and other options for treatment, the patient has consented to  Procedure(s): Left Heart Cath and Coronary Angiography (N/A) as a surgical intervention .  The patient's history has been reviewed, patient examined, no change in status, stable for surgery.  I have reviewed the patient's chart and labs.  Questions were answered to the patient's satisfaction.     Larae Grooms

## 2017-05-16 NOTE — Progress Notes (Signed)
Progress Note  Patient Name: Sean Miranda Date of Encounter: 05/16/2017  Primary Cardiologist: Hilty   Subjective   Chest pain last evening with walking in the room. None this am.   Inpatient Medications    Scheduled Meds: . aspirin  81 mg Oral Daily  . flecainide  50 mg Oral BID  . isosorbide mononitrate  15 mg Oral Daily  . nitroGLYCERIN  1 inch Topical Q6H  . pravastatin  20 mg Oral Q M,W,F   Continuous Infusions:  PRN Meds: acetaminophen, gi cocktail, nitroGLYCERIN, ondansetron (ZOFRAN) IV   Vital Signs    Vitals:   05/15/17 2100 05/15/17 2337 05/16/17 0600 05/16/17 1000  BP: (!) 116/50 (!) 123/50 (!) 104/50 131/63  Pulse: 65  60 72  Resp: 16  16 17   Temp: 98.4 F (36.9 C)  98.3 F (36.8 C) 97.6 F (36.4 C)  TempSrc: Oral  Oral Oral  SpO2: 96%  94% 95%  Weight:      Height:        Intake/Output Summary (Last 24 hours) at 05/16/17 1133 Last data filed at 05/16/17 1033  Gross per 24 hour  Intake             1030 ml  Output             1460 ml  Net             -430 ml   Filed Weights   05/14/17 2125 05/15/17 0046  Weight: 181 lb (82.1 kg) 187 lb 1.6 oz (84.9 kg)    Telemetry    Atrial fib, rates in the  70's. - Personally Reviewed  Physical Exam  Problem GEN: No acute distress.   Neck: No JVD Cardiac: RRR, 1/6 systolic murmur.  rubs, or gallops.  Respiratory: Clear to auscultation bilaterally. GI: Soft, nontender, non-distended  MS: No edema; No deformity. Neuro:  Nonfocal  Psych: Normal affect   Labs    Chemistry Recent Labs Lab 05/14/17 2203  NA 134*  K 3.8  CL 101  CO2 24  GLUCOSE 106*  BUN 16  CREATININE 1.06  CALCIUM 9.1  PROT 6.9  ALBUMIN 3.8  AST 23  ALT 30  ALKPHOS 65  BILITOT 0.7  GFRNONAA >60  GFRAA >60  ANIONGAP 9     Hematology Recent Labs Lab 05/14/17 2203  WBC 5.3  RBC 4.62  HGB 14.7  HCT 41.5  MCV 89.8  MCH 31.8  MCHC 35.4  RDW 13.1  PLT 194    Cardiac Enzymes Recent Labs Lab  05/15/17 0110 05/15/17 0353 05/15/17 0620 05/16/17 0006  TROPONINI <0.03 <0.03 <0.03 <0.03   No results for input(s): TROPIPOC in the last 168 hours.   Radiology    Dg Chest 2 View  Result Date: 05/14/2017 CLINICAL DATA:  Left-sided chest pain EXAM: CHEST  2 VIEW COMPARISON:  02/21/2017 FINDINGS: Linear scarring or atelectasis at both lung bases. No focal consolidation or pleural effusion. Cardiomediastinal silhouette within normal limits with atherosclerosis. No pneumothorax. Postsurgical changes of the left humeral head IMPRESSION: Linear scarring or atelectasis at the lung bases. No acute infiltrate or edema. Electronically Signed   By: Donavan Foil M.D.   On: 05/14/2017 23:32    Cardiac Studies  Recently ordered echocardiogram (pending)  Echo 02/22/17: - Left ventricle: The cavity size was normal. Systolic function was vigorous. The estimated ejection fraction was in the range of 65% to 70%. Wall motion was normal; there were no regional wall motion  abnormalities. The study was not technically sufficient to allow evaluation of LV diastolic dysfunction due to atrial fibrillation. - Aortic valve: Trileaflet; normal thickness leaflets. There was trivial regurgitation. Valve area (VTI): 1.86 cm^2. Valve area (Vmax): 1.87 cm^2. Valve area (Vmean): 1.83 cm^2. - Aortic root: The aortic root was normal in size. - Mitral valve: There was no regurgitation. - Left atrium: The atrium was normal in size. - Right ventricle: Systolic function was normal. - Tricuspid valve: There was trivial regurgitation. - Pulmonic valve: There was no regurgitation. - Pulmonary arteries: Systolic pressure was within the normal range. - Inferior vena cava: The vessel was normal in size. - Pericardium, extracardiac: There was no pericardial effusion.  Nuclear stress test 04/03/17:  Blood pressure demonstrated a normal response to exercise.  There was no ST segment deviation noted during  stress.  The study is normal.  This is a low risk study.  Normal perfusion no ischemia or infarct Not gated due to atrial fibrillation    Patient Profile     83 y.o. male admitted with recurrent chest pain. Normal stress test 2 years ago, persistent atrial fib on Eliquis, carotid artery disease.   Assessment & Plan    1. Chest Pain: Recurrent, relieved with NTG. Had more chest discomfort last evening described as pressure over left side of chest. No diaphoresis, mild weakness. NTG relieved, has been on isosorbide 15 mg which was started yesterday.   Had cardiac cath 2000 by Dr. Little:(Transcribed from scanned document) #1.Left main: Normal #2. LAD: The LAD extended down to the apex of the heart and was a relatively small vessel. It gave rise to one medium size diagonal vessel, and one very small second diagonal. There were proximal irregularities in the LAD of 30-40%, and an area of 50% eccentric narrowing of the proximal portion of the diagonal. #3. The circumflex had one large marginal vessels and proximal irregularities in the OM. No high-grade stenosis was noted. #4: Right coronary artery: The right coronary artery was a largest of all 3 coronary arteries. It extended down and gave rise to the PDA and the posterior lateral branch. The proximal portion had sequential irregularities of 30-40% with no high-grade stenosis.  #5: Normal LV systolic function.  Given patient's recurrent chest pain, known CAD per catheterization although 18 years ago, risk factors of carotid artery disease, hyperlipidemia, will discuss need for transfer to have cardiac catheterization for definitive evaluation of coronary anatomy, and worsening of disease over the last 18 years.  I have discussed this with the patient, verbalizes understanding, is willing to proceed and transfer to [facility] for procedure.The patient understands that risks include but are not limited to stroke (1 in 1000), death (1 in 5),  kidney failure [usually temporary] (1 in 500), bleeding (1 in 200), allergic reaction [possibly serious] (1 in 200), and agrees to proceed.   Discussed with Dr. Dr. Bronson Ing. We'll plan transfer to [facility] today for catheterization this afternoon. He will continue on aspirin, pravastatin, isosorbide. Keep nothing by mouth.  2. Carotid Artery Disease: Status post endarterectomy December 2014.  3. Atrial fibrillation: Last dose at [facility] was at 8:30 AM on 05/15/2017. Heart rate is well controlled. Continue flecainide, aspirin.  Signed, Phill Myron. West Pugh, ANP, AACC   05/16/2017, 11:33 AM    The patient was seen and examined, and I agree with the history, physical exam, assessment and plan as documented above, with modifications as noted below.  The patient continues to have nitrate responsive chest pain.  He required 2 nitroglycerin tablets last night. He was started on Imdur 15 mg daily due to low normal blood pressure yesterday by Dr. Harl Bowie.  Echocardiogram demonstrated normal left ventricular systolic function and regional wall motion.  He had a normal nuclear stress test on 04/03/17.  Cardiac catheterization from 2000 detailed above.  Continue aspirin, pravastatin, and Imdur. He had been taking metoprolol 6.25 mg and was bradycardic yesterday. I stopped it.  He remains in sinus rhythm.  Eliquis was held last night. He remains on flecainide.   I also spoke with Dr. Roderic Palau (hospitalist).  We will make arrangements to transfer to [facility] for coronary angiography.  If significant coronary artery disease is indeed found, flecainide will need to be discontinued.    Kate Sable, MD, [facility]  05/16/2017 12:27 PM

## 2017-05-16 NOTE — Care Management Note (Signed)
Case Management Note  Patient Details  Name: Sean Miranda MRN: [id number] Date of Birth: 07/15/1930  Subjective/Objective:  S/p stent intervention, will be on plavix and asa.  PCP   Bluford Kaufmann               Action/Plan: NCM will follow for dc needs.  Expected Discharge Date:                  Expected Discharge Plan:  Home/Self Care  In-House Referral:  NA  Discharge planning Services  CM Consult  Post Acute Care Choice:  NA Choice offered to:  NA  DME Arranged:    DME Agency:     HH Arranged:    HH Agency:     Status of Service:  Completed, signed off  If discussed at [employer] of Stay Meetings, dates discussed:    Additional Comments:  Zenon Mayo, RN 05/16/2017, 5:17 PM

## 2017-05-17 ENCOUNTER — Encounter (HOSPITAL_COMMUNITY): Payer: Self-pay | Admitting: Interventional Cardiology

## 2017-05-17 ENCOUNTER — Telehealth: Payer: Self-pay | Admitting: Internal Medicine

## 2017-05-17 LAB — BASIC METABOLIC PANEL
ANION GAP: 8 (ref 5–15)
BUN: 12 mg/dL (ref 6–20)
CHLORIDE: 104 mmol/L (ref 101–111)
CO2: 23 mmol/L (ref 22–32)
Calcium: 9.1 mg/dL (ref 8.9–10.3)
Creatinine, Ser: 0.94 mg/dL (ref 0.61–1.24)
GFR calc Af Amer: >60 mL/min (ref >60)
GFR calc non Af Amer: >60 mL/min (ref >60)
GLUCOSE: 148 mg/dL — AB (ref 65–99)
POTASSIUM: 4.3 mmol/L (ref 3.5–5.1)
SODIUM: 135 mmol/L (ref 135–145)

## 2017-05-17 LAB — CBC
HCT: 43.4 % (ref 39.0–52.0)
Hemoglobin: 15.3 g/dL (ref 13.0–17.0)
MCH: 31.4 pg (ref 26.0–34.0)
MCHC: 35.3 g/dL (ref 30.0–36.0)
MCV: 88.9 fL (ref 78.0–100.0)
Platelets: 177 10*3/uL (ref 150–400)
RBC: 4.88 MIL/uL (ref 4.22–5.81)
RDW: 13 % (ref 11.5–15.5)
WBC: 7.9 10*3/uL (ref 4.0–10.5)

## 2017-05-17 MED ORDER — ANGIOPLASTY BOOK
Freq: Once | Status: DC
  Filled 2017-05-17: qty 1

## 2017-05-17 MED ORDER — PRAVASTATIN SODIUM 40 MG PO TABS: 20.0000 mg | ORAL_TABLET | ORAL | 11 refills | Status: DC

## 2017-05-17 MED ORDER — NITROGLYCERIN 0.4 MG SL SUBL: 0.4000 mg | SUBLINGUAL_TABLET | SUBLINGUAL | 12 refills | Status: DC | PRN

## 2017-05-17 MED ORDER — CLOPIDOGREL BISULFATE 75 MG PO TABS: 75.0000 mg | ORAL_TABLET | Freq: Every day | ORAL | 11 refills | Status: DC

## 2017-05-17 MED ORDER — ASPIRIN 81 MG PO CHEW: 81.0000 mg | CHEWABLE_TABLET | Freq: Every day | ORAL | 0 refills | Status: DC

## 2017-05-17 NOTE — Discharge Summary (Signed)
Discharge Summary    Patient ID: Sean Miranda,  MRN: [id number], DOB/AGE: 1930-08-18 83 y.o.  Admit date: 05/14/2017 Discharge date: 05/17/2017  Primary Care Provider: Marletta Lor Primary Cardiologist: Dr Debara Pickett  Discharge Diagnoses    Principal Problem:   Chest pain Active Problems:   Hyperlipidemia   Carotid artery disease [facility])   Essential hypertension   Atrial fibrillation ([place])   Coronary artery disease involving native coronary artery of native heart with unstable angina pectoris (HCC)   Unstable angina (HCC)   Allergies Allergies  Allergen Reactions  . Lipitor [Atorvastatin Calcium] Other (See Comments)    Muscle pain  . Vantin [Cefpodoxime] Rash    Diagnostic Studies/Procedures    ECHO: 05/15/2017 - Left ventricle: The cavity size was normal. Wall thickness was   increased in a pattern of mild LVH. Systolic function was normal.   The estimated ejection fraction was in the range of 60% to 65%.   Wall motion was normal; there were no regional wall motion   abnormalities. The study is not technically sufficient to allow   evaluation of LV diastolic function. - Aortic valve: Mildly calcified annulus. Trileaflet; moderately   thickened leaflets. Valve area (VTI): 2.98 cm^2. Valve area   (Vmax): 2.7 cm^2. Valve area (Vmean): 2.56 cm^2. - Left atrium: The atrium was mildly dilated. - Technically adequate study.  CATH: 05/16/2017  Mid RCA lesion, 50 %stenosed.  Ost RCA lesion, 25 %stenosed. Lesion noted in small RV marginal.  LM lesion, 25 %stenosed.  2nd Diag lesion, 40 %stenosed.  Mid LAD-1 lesion, 20 %stenosed.  Mid LAD-2 lesion, 95 %stenosed.  A STENT SYNERGY DES 2.5X16 drug eluting stent was successfully placed, postdilated to 3.0 mm.  Post intervention, there is a 0% residual stenosis.  The left ventricular systolic function is normal.  LV end diastolic pressure is moderately elevated, 25  The left ventricular ejection fraction  is 55-65% by visual estimate.  There is no aortic valve stenosis.  LAD was the culprit for his unstable angina. He had symptoms during the procedure at rest. These resolved after stent placement. Watch overnight. Continue IV Angiomax for another hour.     Loaded with Plavix today. We'll start 75 mg daily tomorrow. Given aspirin today as well. Would plan for aspirin, Plavix and Eliquis for 30 days. After 30 days, would stop the aspirin and continue Plavix and Eliquis. A synergy drug-eluting stent was used since he is on long-term anticoagulation. If there is a bleeding complication, his antiplatelet therapy could be stopped sooner than usual. Ideally, he would get 6-12 months of Plavix depending on his bleeding risk profile.    Possible discharge tomorrow if he does well tonight and with cardiac rehabilitation tomorrow. Post-Intervention Diagram         _____________   History of Present Illness     83 yo male w/ hx persistent afib, on flecainide and Eliquis, R-CEA, HLD, OA, RBBB & LAFB, non-obs dz by remote cath, nl MV 04/03/2017 was admitted to [facility] 06/13 w/ chest pain.  Hospital Course     Consultants: none   Pt enzymes were negative for MI. He continued to have exertional CP. He was evaluated by cardiology and transferred to [facility] for cath.  Cardiac cath results are above. He had a DES to the LAD, other non-obstructive disease will be treated medically. He tolerated the procedure well.   On 06/15, he was seen by Dr Acie Fredrickson and all date were reviewed. He needs to  be on ASA/Plavix in addition to the Eliquis for 1 month. He is to stop the ASA after 1 month and be on Plavix plus Eliquis.   He was seen by cardiac rehab and ambulated with them, no chest pain or SOB with ambulation.   He was in SR on admission and maintained that throughout his hospital stay. Flecainide will need to be stopped because he now has CAD. Of note, he had left the hospital when this change was made, the patient was  contacted by phone and instructed to stop the Flecainide. Continue metoprolol but he is encouraged to take 1/2 tab BID, not a 1/4 tab.   He is doing well and considered stable for discharge, to follow up initially in [place], then in [place] with Dr Debara Pickett.  _____________  Discharge Vitals Blood pressure (!) 119/58, pulse (!) 58, temperature 97.8 F (36.6 C), temperature source Oral, resp. rate (!) 24, height 6' (1.829 m), weight 181 lb 14.1 oz (82.5 kg), SpO2 98 %.  Filed Weights   05/14/17 2125 05/15/17 0046 05/17/17 0251  Weight: 181 lb (82.1 kg) 187 lb 1.6 oz (84.9 kg) 181 lb 14.1 oz (82.5 kg)    Labs & Radiologic Studies    CBC  Recent Labs  05/14/17 2203 05/17/17 0859  WBC 5.3 7.9  NEUTROABS 2.6  --   HGB 14.7 15.3  HCT 41.5 43.4  MCV 89.8 88.9  PLT 194 675   Basic Metabolic Panel  Recent Labs  05/14/17 2203 05/17/17 0859  NA 134* 135  K 3.8 4.3  CL 101 104  CO2 24 23  GLUCOSE 106* 148*  BUN 16 12  CREATININE 1.06 0.94  CALCIUM 9.1 9.1   Liver Function Tests  Recent Labs  05/14/17 2203  AST 23  ALT 30  ALKPHOS 65  BILITOT 0.7  PROT 6.9  ALBUMIN 3.8    Cardiac Enzymes  Recent Labs  05/15/17 0353 05/15/17 0620 05/16/17 0006  TROPONINI <0.03 <0.03 <0.03   _____________  Dg Chest 2 View Result Date: 05/14/2017 CLINICAL DATA:  Left-sided chest pain EXAM: CHEST  2 VIEW COMPARISON:  02/21/2017 FINDINGS: Linear scarring or atelectasis at both lung bases. No focal consolidation or pleural effusion. Cardiomediastinal silhouette within normal limits with atherosclerosis. No pneumothorax. Postsurgical changes of the left humeral head IMPRESSION: Linear scarring or atelectasis at the lung bases. No acute infiltrate or edema. Electronically Signed   By: Donavan Foil M.D.   On: 05/14/2017 23:32   Disposition   Pt is being discharged home today in good condition.  Follow-up Plans & Appointments    Discharge Instructions    Amb Referral to  Cardiac Rehabilitation    Complete by:  As directed    Diagnosis:   PTCA Coronary Stents     Diet - low sodium heart healthy    Complete by:  As directed    Increase activity slowly    Complete by:  As directed       Discharge Medications   Discharge Medication List as of 05/17/2017 11:26 AM    START taking these medications   Details  aspirin 81 MG chewable tablet Chew 1 tablet (81 mg total) by mouth daily., Starting Fri 05/17/2017, No Print    clopidogrel (PLAVIX) 75 MG tablet Take 1 tablet (75 mg total) by mouth daily with breakfast., Starting Fri 05/17/2017, Normal    nitroGLYCERIN (NITROSTAT) 0.4 MG SL tablet Place 1 tablet (0.4 mg total) under the tongue every 5 (five) minutes as  needed for chest pain., Starting Fri 05/17/2017, Normal      CONTINUE these medications which have CHANGED   Details  pravastatin (PRAVACHOL) 40 MG tablet Take 0.5 tablets (20 mg total) by mouth every Monday, Wednesday, and Friday., Starting Fri 05/17/2017, Normal      CONTINUE these medications which have NOT CHANGED   Details  ALPHA LIPOIC ACID PO Take 300 mg by mouth daily., Until Discontinued, Historical Med    apixaban (ELIQUIS) 5 MG TABS tablet Take 1 tablet (5 mg total) by mouth 2 (two) times daily., Starting Fri 02/22/2017, Normal    Cholecalciferol (VITAMIN D3) 2000 UNITS capsule Take 2,000 Units by mouth daily. , Historical Med    Coenzyme Q10-Levocarnitine 100-20 MG CAPS Take 1 capsule by mouth daily. , Historical Med    doxycycline (VIBRA-TABS) 100 MG tablet Take 1 tablet (100 mg total) by mouth 2 (two) times daily., Starting Thu 05/09/2017, Normal      Glucosamine-Chondroitin 500-400 MG CAPS Take 1 capsule by mouth daily. , Historical Med    hydrocortisone (ANUSOL-HC) 2.5 % rectal cream Place 1 application rectally 2 (two) times daily., Starting Mon 10/08/2016, Normal    Magnesium 500 MG TABS Take 1 tablet by mouth daily., Until Discontinued, Historical Med    metoprolol tartrate  (LOPRESSOR) 25 MG tablet Take 0.5 tablets (12.5 mg total) by mouth 2 (two) times daily., Starting Fri 02/22/2017, Normal    Multiple Vitamin (MULTIVITAMIN) tablet Take 0.5 tablets by mouth daily. , Until Discontinued, Historical Med    OMEGA-3 1000 MG CAPS Take 4,000 mg by mouth daily., Until Discontinued, Historical Med    Probiotic Product (PROBIOTIC DAILY PO) Take 1 capsule by mouth daily., Until Discontinued, Historical Med    sildenafil (REVATIO) 20 MG tablet TAKE 2-3 TABLETS DAILY AS NEEDED FOR ERECTILE DYSFUNCTION., Normal      STOP taking these medications     amoxicillin (AMOXIL) 500 MG capsule         Flecainide 50 mg       Outstanding Labs/Studies   None  Duration of Discharge Encounter   Greater than 30 minutes including physician time.  Jonetta Speak NP 05/17/2017, 1:23 PM  Attending Note:   The patient was seen and examined.  Agree with assessment and plan as noted above.  Changes made to the above note as needed.  Patient seen and independently examined with Rosaria Ferries, PA .   We discussed all aspects of the encounter. I agree with the assessment and plan as stated above.  1. CAD :  Doing well s/p stenting of his mid LAD with a DES. Will get ASA 81 mg , plavix 75 mg , Eliquis for a month Will then anticipate DC of the ASA with continuation of the plavix and eliquis.     I have spent a total of 40 minutes with patient reviewing hospital  notes , telemetry, EKGs, labs and examining patient as well as establishing an assessment and plan that was discussed with the patient. > 50% of time was spent in direct patient care.    Thayer Headings, Brooke Bonito., MD, [facility] 05/17/2017, 5:08 PM [address]  [place] Pager [phone]

## 2017-05-17 NOTE — Discharge Instructions (Signed)
Limit processed sugars and carbohydrates in your diet. Your blood sugars were mildly elevated.  PLEASE REMEMBER TO BRING ALL OF YOUR MEDICATIONS TO EACH OF YOUR FOLLOW-UP OFFICE VISITS.  PLEASE ATTEND ALL SCHEDULED FOLLOW-UP APPOINTMENTS.   Activity: Increase activity slowly as tolerated. You may shower, but no soaking baths (or swimming) for 1 week. No driving for 2 days. No lifting over 5 lbs for 1 week. No sexual activity for 1 week.   You May Return to Work: in 1 week (if applicable)  Wound Care: You may wash cath site gently with soap and water. Keep cath site clean and dry. If you notice pain, swelling, bleeding or pus at your cath site, please call [phone].    Cardiac Cath Site Care Refer to this sheet in the next few weeks. These instructions provide you with information on caring for yourself after your procedure. Your caregiver may also give you more specific instructions. Your treatment has been planned according to current medical practices, but problems sometimes occur. Call your caregiver if you have any problems or questions after your procedure. HOME CARE INSTRUCTIONS  You may shower 24 hours after the procedure. Remove the bandage (dressing) and gently wash the site with plain soap and water. Gently pat the site dry.   Do not apply powder or lotion to the site.   Do not sit in a bathtub, swimming pool, or whirlpool for 5 to 7 days.   No bending, squatting, or lifting anything over 10 pounds (4.5 kg) as directed by your caregiver.   Inspect the site at least twice daily.   Do not drive home if you are discharged the same day of the procedure. Have someone else drive you.   You may drive 24 hours after the procedure unless otherwise instructed by your caregiver.  What to expect:  Any bruising will usually fade within 1 to 2 weeks.   Blood that collects in the tissue (hematoma) may be painful to the touch. It should usually decrease in size and tenderness within  1 to 2 weeks.  SEEK IMMEDIATE MEDICAL CARE IF:  You have unusual pain at the site or down the affected limb.   You have redness, warmth, swelling, or pain at the site.   You have drainage (other than a small amount of blood on the dressing).   You have chills.   You have a fever or persistent symptoms for more than 72 hours.   You have a fever and your symptoms suddenly get worse.   Your leg becomes pale, cool, tingly, or numb.   You have heavy bleeding from the site. Hold pressure on the site.  Document Released: 12/22/2010 Document Revised: 11/08/2011 Document Reviewed: 12/22/2010 [facility] Patient Information 2012 [place].

## 2017-05-17 NOTE — Telephone Encounter (Signed)
Patient wife calling, states that she was told by the pharmacist that patient should not take Eliquis and Clopidogrel together. Please call to discuss, thanks.

## 2017-05-17 NOTE — Telephone Encounter (Signed)
Verified w pharmD - recommended to take both unless o/w instructed by MD  Pt takes Eliquis for A Fib, and plavix is new Rx along w ASA for DAPT following recent cath in hospital.  Advised to continue both, education given on antiplatelet meds vs anticoags. Reviewed med list w all changes. Caller voiced understanding and thanks. Aware to call if further questions or needs regarding med regimen.

## 2017-05-17 NOTE — Progress Notes (Addendum)
TR BAND REMOVAL  LOCATION:  R   radial  DEFLATED PER PROTOCOL: Yes    TIME BAND OFF / DRESSING APPLIED:   22:15  SITE UPON ARRIVAL:    Level 0  SITE AFTER BAND REMOVAL:    Level 0  CIRCULATION SENSATION AND MOVEMENT:    Within Normal Limits   Yes  COMMENTS:   Pt tolerated procedure well. VSS. No impairment noted.

## 2017-05-17 NOTE — Progress Notes (Signed)
CARDIAC REHAB PHASE I   PRE:  Rate/Rhythm: 63 SR    BP: sitting 119/58    SaO2:   MODE:  Ambulation: 1000 ft   POST:  Rate/Rhythm: 74 SR    BP: sitting 132/48     SaO2:   Tolerated very well, no c/o, no afib. Ed completed with good reception. He eats right and exercises. Will send referral to [place]. He understands the importance of Plavix/ASA/Eliquis.  [place], ACSM 05/17/2017 8:53 AM

## 2017-05-17 NOTE — Progress Notes (Signed)
Progress Note  Patient Name: Sean Miranda Date of Encounter: 05/17/2017  Primary Cardiologist: Hilty  Subjective   83 , very active man Admitted with CP  Hx of PAF, on Eliquis  Found to have a tight mid LAD stenosis  Had a 2.5 x 16 Synergy DES placed without complications Has ambulated without any problems this am   Inpatient Medications    Scheduled Meds: . angioplasty book   Does not apply Once  . aspirin  81 mg Oral Daily  . clopidogrel  75 mg Oral Q breakfast  . flecainide  50 mg Oral BID  . isosorbide mononitrate  15 mg Oral Daily  . pravastatin  20 mg Oral Q M,W,F  . sodium chloride flush  3 mL Intravenous Q12H   Continuous Infusions: . sodium chloride     PRN Meds: sodium chloride, acetaminophen, gi cocktail, nitroGLYCERIN, ondansetron (ZOFRAN) IV, sodium chloride flush   Vital Signs    Vitals:   05/16/17 2100 05/17/17 0055 05/17/17 0251 05/17/17 0745  BP: (!) 131/51 (!) 166/73 (!) 125/54 (!) 119/58  Pulse: (!) 57 66 62 (!) 58  Resp: 19 13 15  (!) 24  Temp:   98.4 F (36.9 C) 97.8 F (36.6 C)  TempSrc:   Oral Oral  SpO2: 95% 96% 97% 98%  Weight:   181 lb 14.1 oz (82.5 kg)   Height:        Intake/Output Summary (Last 24 hours) at 05/17/17 1001 Last data filed at 05/17/17 0918  Gross per 24 hour  Intake             1283 ml  Output              800 ml  Net              483 ml   Filed Weights   05/14/17 2125 05/15/17 0046 05/17/17 0251  Weight: 181 lb (82.1 kg) 187 lb 1.6 oz (84.9 kg) 181 lb 14.1 oz (82.5 kg)    Telemetry    Sinus brady  - Personally Reviewed  ECG     sinus brad  - Personally Reviewed  Physical Exam   GEN: No acute distress.   Neck: No JVD Cardiac: RR, soft systolic murmur  rubs, or gallops.  Respiratory: Clear to auscultation bilaterally. GI: Soft, nontender, non-distended  MS: No edema; No deformity.  Right radial cath site looks good  Neuro:  Nonfocal  Psych: Normal affect   Labs    Chemistry Recent  Labs Lab 05/14/17 2203 05/17/17 0859  NA 134* 135  K 3.8 4.3  CL 101 104  CO2 24 23  GLUCOSE 106* 148*  BUN 16 12  CREATININE 1.06 0.94  CALCIUM 9.1 9.1  PROT 6.9  --   ALBUMIN 3.8  --   AST 23  --   ALT 30  --   ALKPHOS 65  --   BILITOT 0.7  --   GFRNONAA >60 >60  GFRAA >60 >60  ANIONGAP 9 8     Hematology Recent Labs Lab 05/14/17 2203 05/17/17 0859  WBC 5.3 7.9  RBC 4.62 4.88  HGB 14.7 15.3  HCT 41.5 43.4  MCV 89.8 88.9  MCH 31.8 31.4  MCHC 35.4 35.3  RDW 13.1 13.0  PLT 194 177    Cardiac Enzymes Recent Labs Lab 05/15/17 0110 05/15/17 0353 05/15/17 0620 05/16/17 0006  TROPONINI <0.03 <0.03 <0.03 <0.03   No results for input(s): TROPIPOC in the last 168 hours.  BNPNo results for input(s): BNP, PROBNP in the last 168 hours.   DDimer No results for input(s): DDIMER in the last 168 hours.   Radiology    No results found.  Cardiac Studies     Patient Profile     83 y.o. male admitted with CP  Assessment & Plan    1. CAD :   S/p DES to mid LAD  Has PAF The plan is for ASA, Plavix, Eliquis for 1 month followed by Plavix and Eliquis   Follow up in several weeks with the APP  Will See Dr. Debara Pickett soon .    2. Paroxysmal atrial fib:  Continue eliquis. Is in sinus brady at present    Signed, Mertie Moores, MD  05/17/2017, 10:01 AM

## 2017-05-22 ENCOUNTER — Ambulatory Visit (HOSPITAL_COMMUNITY): Admission: RE | Admit: 2017-05-22 | Payer: Medicare Other | Source: Ambulatory Visit | Admitting: Internal Medicine

## 2017-05-22 ENCOUNTER — Encounter (HOSPITAL_COMMUNITY): Admission: RE | Payer: Self-pay | Source: Ambulatory Visit

## 2017-05-22 SURGERY — CARDIOVERSION
Anesthesia: Monitor Anesthesia Care

## 2017-05-28 NOTE — Progress Notes (Signed)
Cardiology Office Note    Date:  05/29/2017   ID:  Sean Miranda, DOB 1930-01-18, MRN [id number]  PCP:  Marletta Lor, MD  Cardiologist: Dr. Debara Pickett  Chief Complaint  Patient presents with  . Follow-up    History of Present Illness:  Sean Miranda is a 83 y.o. male 83 yo male w/ hx persistent afib, on flecainide and Eliquis, R-CEA, HLD, OA, RBBB & LAFB, non-obs dz by remote cath, nl MV 04/03/2017 was admitted to [facility] 06/13 w/ chest pain. He underwent DES LAD with non-obstructive disease elsewhere. Plan for ASA/Plavix/Eliquis for 1 month then stop ASA and continue Plavix and Eliquis.  He was in NSR throughout hospitalization. Flecainide has to be stopped b/c of CAD. Metoprolol increased to 1/2 BID  Patient comes in today feeling well. He denies any chest pain. He has not had any fast or irregular heartbeats. He says his heart rates have been in the 40s and 50s and he has not taken any metoprolol. He exercises regularly and feels great.      Past Medical History:  Diagnosis Date  . Arthritis    neck   . Atrial fibrillation ([place])    a. new-onset in 01/2017. Started on Eliquis  . CAD (coronary artery disease), native coronary artery 05/16/2017   LAD 95%>>0 w/ 2.5 x 16 mm Synergy DES, D2 40%, RCA 50%, EF 55-65%, LVEDP mod elevated  . Calcification of coronary artery    a. mild, nonobstructive CAD by cath in 2009.  Marland Kitchen Cancer of skin of chest   . Carotid artery occlusion    a. s/p R CEA in 2014  . Colon polyps   . Hyperlipidemia    takes Pravastatin every other day  . Pneumonia ~ 01/2013    Past Surgical History:  Procedure Laterality Date  . CARDIAC CATHETERIZATION  06/09/1999   mild CAD (LAD, diagonal, RCA) - Dr. Loni Muse. [place]  . CARDIOVERSION N/A 03/25/2017   Procedure: CARDIOVERSION;  Surgeon: Pixie Casino, MD;  Location: [facility] ENDOSCOPY;  Service: Cardiovascular;  Laterality: N/A;  . CAROTID DOPPLER  02/2012   60-79% RICA stenosis, 57-32% LICA stenosis (prior to  carotid endarterectomy)  . COLONOSCOPY    . CORONARY STENT INTERVENTION N/A 05/16/2017   Procedure: Coronary Stent Intervention;  Surgeon: Jettie Booze, MD;  Location: [facility] CV LAB;  Service: Cardiovascular;  Laterality: N/A;  . ENDARTERECTOMY Right 11/10/2013   Procedure: ENDARTERECTOMY CAROTID;  Surgeon: Conrad Commerce, MD;  Location: [facility];  Service: Vascular;  Laterality: Right;  . INGUINAL HERNIA REPAIR Left   . LEFT HEART CATH AND CORONARY ANGIOGRAPHY N/A 05/16/2017   Procedure: Left Heart Cath and Coronary Angiography;  Surgeon: Jettie Booze, MD;  Location: [place] CV LAB;  Service: Cardiovascular;  Laterality: N/A;  . MOLE REMOVAL  1990s   "chest"  . NM MYOCAR PERF WALL MOTION  04/2011   bruce myoview - perfusion defect in inferior myocardium (diaphragmatic attenuation), remaining myocardium with normal perfusion, EF 67%  . PATCH ANGIOPLASTY Right 11/10/2013   Procedure: PATCH ANGIOPLASTY;  Surgeon: Conrad Easley, MD;  Location: [facility];  Service: Vascular;  Laterality: Right;  . PILONIDAL CYST DRAINAGE    . SHOULDER OPEN ROTATOR CUFF REPAIR Bilateral 1998 - 2001  . SKIN CANCER EXCISION  ~ 2015   "chest"  . TONSILLECTOMY      Current Medications: Current Meds  Medication Sig  . ALPHA LIPOIC ACID PO Take 300 mg by mouth daily.  Marland Kitchen  apixaban (ELIQUIS) 5 MG TABS tablet Take 1 tablet (5 mg total) by mouth 2 (two) times daily.  Marland Kitchen aspirin 81 MG chewable tablet Chew 1 tablet (81 mg total) by mouth daily.  . Cholecalciferol (VITAMIN D3) 2000 UNITS capsule Take 2,000 Units by mouth daily.   . clopidogrel (PLAVIX) 75 MG tablet Take 1 tablet (75 mg total) by mouth daily with breakfast.  . Coenzyme Q10-Levocarnitine 100-20 MG CAPS Take 1 capsule by mouth daily.   . Glucosamine-Chondroitin 500-400 MG CAPS Take 1 capsule by mouth daily.   . hydrocortisone (ANUSOL-HC) 2.5 % rectal cream Place 1 application rectally 2 (two) times daily. (Patient taking differently: Place 1  application rectally 2 (two) times daily as needed for itching. )  . Magnesium 500 MG TABS Take 1 tablet by mouth daily.  . Multiple Vitamin (MULTIVITAMIN) tablet Take 0.5 tablets by mouth daily.   . nitroGLYCERIN (NITROSTAT) 0.4 MG SL tablet Place 1 tablet (0.4 mg total) under the tongue every 5 (five) minutes as needed for chest pain.  Marland Kitchen OMEGA-3 1000 MG CAPS Take 4,000 mg by mouth daily.  . pravastatin (PRAVACHOL) 40 MG tablet Take 0.5 tablets (20 mg total) by mouth every Monday, Wednesday, and Friday.  . Probiotic Product (PROBIOTIC DAILY PO) Take 1 capsule by mouth daily.  . sildenafil (REVATIO) 20 MG tablet TAKE 2-3 TABLETS DAILY AS NEEDED FOR ERECTILE DYSFUNCTION.  . [DISCONTINUED] flecainide (TAMBOCOR) 50 MG tablet Take 1 tablet (50 mg total) by mouth 2 (two) times daily.  . [DISCONTINUED] metoprolol tartrate (LOPRESSOR) 25 MG tablet Take 0.5 tablets (12.5 mg total) by mouth 2 (two) times daily. (Patient taking differently: Take 6.25 mg by mouth daily. )     Allergies:   Lipitor [atorvastatin calcium] and Vantin [cefpodoxime]   Social History   Social History  . Marital status: Married    Spouse name: N/A  . Number of children: 3  . Years of education: N/A   Occupational History  .  [place] History Main Topics  . Smoking status: Former Smoker    Packs/day: 1.00    Years: 18.00    Types: Cigarettes    Quit date: 12/03/1972  . Smokeless tobacco: Never Used  . Alcohol use 8.4 oz/week    7 Glasses of wine, 7 Shots of liquor per week  . Drug use: No  . Sexual activity: Yes   Other Topics Concern  . None   Social History Narrative  . None     Family History:  The patient's   family history includes Cancer in his brother; Hypertension in his mother; Prostate cancer in his son; Stroke in his brother and father.   ROS:   Please see the history of present illness.    Review of Systems  Constitution: Negative.  HENT: Negative.   Cardiovascular: Negative.     Respiratory: Negative.   Endocrine: Negative.   Hematologic/Lymphatic: Negative.   Musculoskeletal: Negative.   Gastrointestinal: Negative.   Genitourinary: Negative.   Neurological: Negative.    All other systems reviewed and are negative.   PHYSICAL EXAM:   VS:  BP (!) 144/63   Pulse (!) 53   Ht 5\' 10"  (1.778 m)   Wt 185 lb (83.9 kg)   BMI 26.54 kg/m   Physical Exam  GEN: Well nourished, well developed, in no acute distress  Neck: no JVD, carotid bruits, or masses Cardiac:RRR; no murmurs, rubs, or gallops  Respiratory:  clear to auscultation bilaterally, normal work  of breathing GI: soft, nontender, nondistended, + BS Ext: Right arm at cath site without hematoma or hemorrhage, good radial and brachial pulses, lower extremities without cyanosis, clubbing, or edema, Good distal pulses bilaterally Neuro:  Alert and Oriented x 3 Psych: euthymic mood, full affect  Wt Readings from Last 3 Encounters:  05/29/17 185 lb (83.9 kg)  05/17/17 181 lb 14.1 oz (82.5 kg)  05/14/17 191 lb (86.6 kg)      Studies/Labs Reviewed:   EKG:  EKG is ordered today.  The ekg ordered today demonstrates Sinus bradycardia 53 bpm with right bundle branch block  Recent Labs: 02/21/2017: Magnesium 2.2; TSH 1.889 05/14/2017: ALT 30 05/17/2017: BUN 12; Creatinine, Ser 0.94; Hemoglobin 15.3; Platelets 177; Potassium 4.3; Sodium 135   Lipid Panel    Component Value Date/Time   CHOL 194 02/22/2017 0403   TRIG 63 02/22/2017 0403   HDL 38 (L) 02/22/2017 0403   CHOLHDL 5.1 02/22/2017 0403   VLDL 13 02/22/2017 0403   LDLCALC 143 (H) 02/22/2017 0403   LDLDIRECT 149.3 01/05/2014 0940    Additional studies/ records that were reviewed today include:  ECHO: 05/15/2017 - Left ventricle: The cavity size was normal. Wall thickness was   increased in a pattern of mild LVH. Systolic function was normal.   The estimated ejection fraction was in the range of 60% to 65%.   Wall motion was normal; there were no  regional wall motion   abnormalities. The study is not technically sufficient to allow   evaluation of LV diastolic function. - Aortic valve: Mildly calcified annulus. Trileaflet; moderately   thickened leaflets. Valve area (VTI): 2.98 cm^2. Valve area   (Vmax): 2.7 cm^2. Valve area (Vmean): 2.56 cm^2. - Left atrium: The atrium was mildly dilated. - Technically adequate study.   CATH: 05/16/2017  Mid RCA lesion, 50 %stenosed.  Ost RCA lesion, 25 %stenosed. Lesion noted in small RV marginal.  LM lesion, 25 %stenosed.  2nd Diag lesion, 40 %stenosed.  Mid LAD-1 lesion, 20 %stenosed.  Mid LAD-2 lesion, 95 %stenosed.  A STENT SYNERGY DES 2.5X16 drug eluting stent was successfully placed, postdilated to 3.0 mm.  Post intervention, there is a 0% residual stenosis.  The left ventricular systolic function is normal.  LV end diastolic pressure is moderately elevated, 25  The left ventricular ejection fraction is 55-65% by visual estimate.  There is no aortic valve stenosis.  LAD was the culprit for his unstable angina. He had symptoms during the procedure at rest. These resolved after stent placement. Watch overnight. Continue IV Angiomax for another hour.      Loaded with Plavix today. We'll start 75 mg daily tomorrow. Given aspirin today as well. Would plan for aspirin, Plavix and Eliquis for 30 days. After 30 days, would stop the aspirin and continue Plavix and Eliquis. A synergy drug-eluting stent was used since he is on long-term anticoagulation. If there is a bleeding complication, his antiplatelet therapy could be stopped sooner than usual. Ideally, he would get 6-12 months of Plavix depending on his bleeding risk profile.    Possible discharge tomorrow if he does well tonight and with cardiac rehabilitation tomorrow.   Nuclear stress 5/2/18Blood pressure demonstrated a normal response to exercise.  There was no ST segment deviation noted during stress.  The study is  normal.  This is a low risk study.   Normal perfusion no ischemia or infarct Not gated due to atrial fibrillation       ASSESSMENT:    1.  Atherosclerosis of native coronary artery of native heart without angina pectoris   2. Persistent atrial fibrillation ([place])   3. Bilateral carotid artery disease ([place])   4. Essential hypertension   5. Pure hypercholesterolemia      PLAN:  In order of problems listed above:  CAD status post DES to the LAD with nonobstructive disease elsewhere. Plan for Plavix/aspirin/Eliquis for one month then stop the aspirin and continue Plavix/Eliquis. F/u with Dr. Debara Pickett July 11  Persistent atrial fibrillation recently started on flecainide but this was stopped because of his diagnosis of CAD. Metoprolol increased in the hospital but patient stop this completely because of heart rates in the 40s and low 50s. It gets up in the 60s when he exercises but not higher. We will allow Dr.Hilty to reevaluate.  Bilateral carotid artery stenosis stable on Dopplers 01/04/17  Essential hypertension controlled  Hypercholesterolemia intolerant to Lipitor in the past now tolerating low-dose pravastatin  Medication Adjustments/Labs and Tests Ordered: Current medicines are reviewed at length with the patient today.  Concerns regarding medicines are outlined above.  Medication changes, Labs and Tests ordered today are listed in the Patient Instructions below. Patient Instructions  Your physician recommends that you schedule a follow-up appointment in: July 11   Your physician has recommended you make the following change in your medication:   Stop taking Aspirin on July 15   Continue Taking Plavix and Eliquis   If you need a refill on your cardiac medications before your next appointment, please call your pharmacy.  Thank you for choosing [facility]!        Sumner Boast, PA-C  05/29/2017 12:03 PM    [facility] [place], [place], [place]  [address] Phone: [phone]; Fax: [phone]

## 2017-05-29 ENCOUNTER — Encounter: Payer: Self-pay | Admitting: Physician Assistant

## 2017-05-29 ENCOUNTER — Ambulatory Visit (INDEPENDENT_AMBULATORY_CARE_PROVIDER_SITE_OTHER): Payer: Medicare Other | Admitting: Physician Assistant

## 2017-05-29 VITALS — BP 144/63 | HR 53 | Ht 70.0 in | Wt 185.0 lb

## 2017-05-29 DIAGNOSIS — I779 Disorder of arteries and arterioles, unspecified: Secondary | ICD-10-CM | POA: Diagnosis not present

## 2017-05-29 DIAGNOSIS — I481 Persistent atrial fibrillation: Secondary | ICD-10-CM

## 2017-05-29 DIAGNOSIS — I10 Essential (primary) hypertension: Secondary | ICD-10-CM

## 2017-05-29 DIAGNOSIS — I2510 Atherosclerotic heart disease of native coronary artery without angina pectoris: Secondary | ICD-10-CM

## 2017-05-29 DIAGNOSIS — I739 Peripheral vascular disease, unspecified: Secondary | ICD-10-CM

## 2017-05-29 DIAGNOSIS — I4819 Other persistent atrial fibrillation: Secondary | ICD-10-CM

## 2017-05-29 DIAGNOSIS — E7800 Pure hypercholesterolemia, unspecified: Secondary | ICD-10-CM | POA: Diagnosis not present

## 2017-05-29 NOTE — Patient Instructions (Signed)
Your physician recommends that you schedule a follow-up appointment in: July 11   Your physician has recommended you make the following change in your medication:   Stop taking Aspirin on July 15   Continue Taking Plavix and Eliquis   If you need a refill on your cardiac medications before your next appointment, please call your pharmacy.  Thank you for choosing [facility]!

## 2017-06-12 ENCOUNTER — Encounter: Payer: Self-pay | Admitting: Internal Medicine

## 2017-06-12 ENCOUNTER — Ambulatory Visit (INDEPENDENT_AMBULATORY_CARE_PROVIDER_SITE_OTHER): Payer: Medicare Other | Admitting: Internal Medicine

## 2017-06-12 ENCOUNTER — Ambulatory Visit: Payer: Medicare Other | Admitting: Internal Medicine

## 2017-06-12 VITALS — BP 128/70 | HR 56 | Ht 71.0 in | Wt 185.6 lb

## 2017-06-12 DIAGNOSIS — I4891 Unspecified atrial fibrillation: Secondary | ICD-10-CM | POA: Diagnosis not present

## 2017-06-12 DIAGNOSIS — Z955 Presence of coronary angioplasty implant and graft: Secondary | ICD-10-CM | POA: Diagnosis not present

## 2017-06-12 DIAGNOSIS — E785 Hyperlipidemia, unspecified: Secondary | ICD-10-CM

## 2017-06-12 DIAGNOSIS — I2510 Atherosclerotic heart disease of native coronary artery without angina pectoris: Secondary | ICD-10-CM

## 2017-06-12 DIAGNOSIS — I779 Disorder of arteries and arterioles, unspecified: Secondary | ICD-10-CM

## 2017-06-12 DIAGNOSIS — I739 Peripheral vascular disease, unspecified: Secondary | ICD-10-CM

## 2017-06-12 NOTE — Patient Instructions (Addendum)
Your physician wants you to follow-up in: 6 months with Dr. Debara Pickett. You will receive a reminder letter in the mail two months in advance. If you don't receive a letter, please call our office to schedule the follow-up appointment.  Your physician has recommended you make the following change in your medication:  -- STOP aspirin on June 16, 2017  Your physician recommends that you return for lab work FASTING to check cholesterol

## 2017-06-12 NOTE — Progress Notes (Signed)
OFFICE NOTE  Chief Complaint:  Hospital follow-up  Primary Care Physician: Marletta Lor, MD  HPI:  Sean Miranda is a pleasant 83 yo male, previously followed by Dr., Rex Kras, who has calcification of his coronary arteries documented by catheterization in 2009, but no occlusive disease. He had a nuclear study May 2012 that was a low risk and a 67% ejection fraction. He has known carotid disease, had Doppler studies done through [employer] that shows less than 80% on the right and less than 60% on the left, which is an increase from his Doppler study done a year previously, and he is currently supposedly scheduled for repeat study in 6 months. He has had no episodes of angina, no unusual shortness of breath, no awareness of any arrhythmias. He has had no ankle edema. He denies dizziness, lightheadedness, unilateral weakness, slurred speech or blurred vision. He checks his blood pressure regularly at home. It has been under good control. He has no unusual shortness of breath and has had no changes in exercise tolerance or stamina. He had had significant myalgias from Lipitor in the past, and I am a little concerned he may develop recurrent myalgias from the daily dose of Pravachol but I see no reason we cannot try this in view of his lipids which now show an LDL of 142 with an HDL of 51.  Unfortunately he was intolerant of Pravachol and other statins, is not currently taking them. He recently had repeat carotid Dopplers which showed high grade velocities in the right carotid and the upper end of the range of 79%. His carotids are followed that with our imaging and Dr. Leanne Chang is coordinating that.  Sean Miranda returns today and is doing fairly well. He does report some shortness of breath with exertion and some chest tightness which improves after exercise.  The chest tightness is fairly predictable with exercise and relieved by rest. This is concerning for possible angina.  He reports he discontinued his pravastatin about one year ago due to problems with leg cramps. He's also had side effects with Lipitor. Recent cholesterol profile showed total cholesterol 222, triglycerides 82, HDL 43 and LDL 162.  He's also had progression of his right internal carotid artery stenosis and underwent right carotid endarterectomy for greater than 80% stenosis of that artery. He does have moderate stenosis of the left internal carotid.  Sean Miranda was referred for a stress test after his last visit for exertional chest pain. Stress test was negative for ischemia. He returns today without complaints. He is due for a repeat doppler in 3-4 months.  04/16/2017  Sean Miranda returns today for follow-up. He is in persistent A. fib and failed recent cardioversion. Stress testing demonstrated no new ischemia. LVEF was normal on echo. We discussed options including antiarrhythmic therapy since he is symptomatic with his A. fib and he is agreeable to start on therapy. EKG today shows atrial fibrillation at 72 with a QTC of 440 ms. He is on metoprolol tartrate 12.5 mg twice a day.  06/12/2017  Sean Miranda returns for follow-up. He recently underwent a stress test which showed no ischemia and was started on flecainide. Unfortunately then developed acute chest pain and found to have non-STEMI. He underwent recatheterization on 05/16/2017, this demonstrated the following:    Mid RCA lesion, 50 %stenosed.  Ost RCA lesion, 25 %stenosed. Lesion noted in small RV marginal.  LM lesion, 25 %stenosed.  2nd Diag lesion, 40 %stenosed.  Mid LAD-1 lesion, 20 %  stenosed.  Mid LAD-2 lesion, 95 %stenosed.  A STENT SYNERGY DES 2.5X16 drug eluting stent was successfully placed, postdilated to 3.0 mm.  Post intervention, there is a 0% residual stenosis.  The left ventricular systolic function is normal.  LV end diastolic pressure is moderately elevated.  The left ventricular ejection fraction is 55-65%  by visual estimate.  There is no aortic valve stenosis.   LAD was the culprit for his unstable angina. He had symptoms during the procedure at rest. These resolved after stent placement. Watch overnight. Continue IV Angiomax for another hour.  Loaded with Plavix today. We'll start 75 mg daily tomorrow. Given aspirin today as well. Would plan for aspirin, Plavix and Eliquis for 30 days. After 30 days, would stop the aspirin and continue Plavix and Eliquis. A synergy drug-eluting stent was used since he is on long-term anticoagulation. If there is a bleeding complication, his antiplatelet therapy could be stopped sooner than usual. Ideally, he would get 6-12 months of Plavix depending on his bleeding risk profile.   Today he returns for follow-up. He seems to be doing well. He denies any further chest pain or worsening shortness of breath. He's had no recurrent A. fib and is back in sinus rhythm. We have discontinued flecainide due to his active coronary artery disease as it is conscious indicated. He remains on aspirin, Plavix and Eliquis. The plan was to stay on all 3 for one month and then switched to Plavix and Eliquis. Of note his recent lipid profile shows very poor control. His LDL-C was 143 with total cholesterol 194, triglycerides 63 and HDL 38. He reportedly has pravastatin 20 mg 3 times weekly. There is no evidence to support this dosing combination with reduction of cardiovascular risk. He is unwilling to take any other statins due to his concerns about side effects, memory loss, muscle pain, etc. He says he does "extensive reading on this".  PMHx:  Past Medical History:  Diagnosis Date  . Arthritis    neck   . Atrial fibrillation ([place])    a. new-onset in 01/2017. Started on Eliquis  . CAD (coronary artery disease), native coronary artery 05/16/2017   LAD 95%>>0 w/ 2.5 x 16 mm Synergy DES, D2 40%, RCA 50%, EF 55-65%, LVEDP mod elevated  . Calcification of coronary artery    a. mild,  nonobstructive CAD by cath in 2009.  Marland Kitchen Cancer of skin of chest   . Carotid artery occlusion    a. s/p R CEA in 2014  . Colon polyps   . Hyperlipidemia    takes Pravastatin every other day  . Pneumonia ~ 01/2013    Past Surgical History:  Procedure Laterality Date  . CARDIAC CATHETERIZATION  06/09/1999   mild CAD (LAD, diagonal, RCA) - Dr. Loni Muse. [place]  . CARDIOVERSION N/A 03/25/2017   Procedure: CARDIOVERSION;  Surgeon: Pixie Casino, MD;  Location: [facility] END[facility]COPY;  Service: Cardiovascular;  Laterality: N/A;  . CAROTID DOPPLER  02/2012   60-79% RICA stenosis, 53-29% LICA stenosis (prior to carotid endarterectomy)  . COLONOSCOPY    . CORONARY STENT INTERVENTION N/A 05/16/2017   Procedure: Coronary Stent Intervention;  Surgeon: Jettie Booze, MD;  Location: [place] [facility];  Service: Cardiovascular;  Laterality: N/A;  . ENDARTERECTOMY Right 11/10/2013   Procedure: ENDARTERECTOMY CAROTID;  Surgeon: Conrad Buxton, MD;  Location: [place];  Service: Vascular;  Laterality: Right;  . INGUINAL HERNIA REPAIR Left   . LEFT HEART CATH AND CORONARY ANGIOGRAPHY N/A 05/16/2017  Procedure: Left Heart Cath and Coronary Angiography;  Surgeon: Jettie Booze, MD;  Location: [place] [facility];  Service: Cardiovascular;  Laterality: N/A;  . MOLE REMOVAL  1990s   "chest"  . NM MYOCAR PERF WALL MOTION  04/2011   bruce myoview - perfusion defect in inferior myocardium (diaphragmatic attenuation), remaining myocardium with normal perfusion, EF 67%  . PATCH ANGIOPLASTY Right 11/10/2013   Procedure: PATCH ANGIOPLASTY;  Surgeon: Conrad Hillman, MD;  Location: [place];  Service: Vascular;  Laterality: Right;  . PILONIDAL CYST DRAINAGE    . SHOULDER OPEN ROTATOR CUFF REPAIR Bilateral 1998 - 2001  . SKIN CANCER EXCISION  ~ 2015   "chest"  . TONSILLECTOMY      FAMHx:  Family History  Problem Relation Age of Onset  . Stroke Father   . Hypertension Mother   . Prostate cancer Son   . Stroke Brother   .  Cancer Brother        spine    SOCHx:   reports that he quit smoking about 44 years ago. His smoking use included Cigarettes. He has a 18.00 pack-year smoking history. He has never used smokeless tobacco. He reports that he drinks about 8.4 oz of alcohol per week . He reports that he does not use drugs.  ALLERGIES:  Allergies  Allergen Reactions  . Lipitor [Atorvastatin Calcium] Other (See Comments)    Muscle pain  . Vantin [Cefpodoxime] Rash    ROS: Pertinent items noted in HPI and remainder of comprehensive ROS otherwise negative.  HOME MEDS: Current Outpatient Prescriptions  Medication Sig Dispense Refill  . ALPHA LIPOIC ACID PO Take 300 mg by mouth daily.    Marland Kitchen apixaban (ELIQUIS) 5 MG TABS tablet Take 1 tablet (5 mg total) by mouth 2 (two) times daily. 60 tablet 11  . aspirin EC 81 MG tablet Take 81 mg by mouth daily. STOP taking on June 16, 2017    . Cholecalciferol (VITAMIN D3) 2000 UNITS capsule Take 2,000 Units by mouth daily.     . clopidogrel (PLAVIX) 75 MG tablet Take 1 tablet (75 mg total) by mouth daily with breakfast. 30 tablet 11  . Coenzyme Q10-Levocarnitine 100-20 MG CAPS Take 1 capsule by mouth daily.     . Glucosamine-Chondroitin 500-400 MG CAPS Take 1 capsule by mouth daily.     . Magnesium 500 MG TABS Take 1 tablet by mouth daily.    . Multiple Vitamin (MULTIVITAMIN) tablet Take 0.5 tablets by mouth daily.     . nitroGLYCERIN (NITROSTAT) 0.4 MG SL tablet Place 1 tablet (0.4 mg total) under the tongue every 5 (five) minutes as needed for chest pain. 25 tablet 12  . OMEGA-3 1000 MG CAPS Take 4,000 mg by mouth daily.    . pravastatin (PRAVACHOL) 40 MG tablet Take 0.5 tablets (20 mg total) by mouth every Monday, Wednesday, and Friday. 30 tablet 11  . Probiotic Product (PROBIOTIC DAILY PO) Take 1 capsule by mouth daily.    . sildenafil (REVATIO) 20 MG tablet TAKE 2-3 TABLETS DAILY AS NEEDED FOR ERECTILE DYSFUNCTION. 60 tablet 4   No current facility-administered  medications for this visit.     LABS/IMAGING: No results found for this or any previous visit (from the past 48 hour(s)). No results found.  VITALS: BP 128/70   Pulse (!) 56   Ht 5\' 11"  (1.803 m)   Wt 185 lb 9.6 oz (84.2 kg)   BMI 25.89 kg/m   EXAM: General appearance: alert  and no distress Neck: no carotid bruit, no JVD and thyroid not enlarged, symmetric, no tenderness/mass/nodules Lungs: clear to auscultation bilaterally Heart: regular rate and rhythm Abdomen: soft, non-tender; bowel sounds normal; no masses,  no organomegaly Extremities: extremities normal, atraumatic, no cyanosis or edema Pulses: 2+ and symmetric Skin: Skin color, texture, turgor normal. No rashes or lesions Neurologic: Grossly normal Psych: Pleasant  EKG: Sinus bradycardia 56, RBBB-personally reviewed  ASSESSMENT: 1. Coronary artery disease status post PCI to the mid-LAD (05/2017) 2. Symptomatic persistent a-fib - negative nuclear stress test (04/2017), normal EF on echo 01/2017 3. CHADSVASC score of 4 - on Eliquis 4. History of calcified coronaries with no obstructive disease 5. Moderate left internal carotid artery stenosis, s/p right CEA 6. Dyslipidemia - tolerating pravastatin 7. Hypertension-controlled  PLAN: 1.   Sean Miranda had a negative nuclear stress test and recent echo with normal LV systolic function and no structural heart disease. He was placed on flecainide and then developed an acute coronary syndrome. Ultimately found to have a 95% blockage of the mid LAD. He underwent successful drug-eluting stent placement in June. It's now been almost a month and he denies any worsening chest pain or shortness of breath. His blood type was discontinued. He is on triple therapy and I've advised him to stop aspirin after July 15. To remain on Plavix and Eliquis. He is on pravastatin 3 times weekly, however I doubt he is taking this. This is willfully in adequate to treat him to goal. His LDL currently is  143. We discussed several options including high-dose statins and he is not willing to take a statin medication. Another alternative would be PCSK9 inhibitor. He said that he may be willing to consider this. We will plan to recheck a lipid profile per his request in one month. If this is not significantly improved plan to address this again with him. He understands that he is at risk for future coronary events with his elevated cluster as well as worsening of his carotid artery disease which was at least moderate on the left and he has a history of right carotid endarterectomy.  Follow-up in 6 months.  Pixie Casino, MD, [place]  Attending Cardiologist  Direct Dial: [phone]  Fax: [phone]  Website:  [email or web address] 06/12/2017, 4:46 PM

## 2017-06-29 ENCOUNTER — Encounter (HOSPITAL_COMMUNITY): Payer: Self-pay | Admitting: Emergency Medicine

## 2017-06-29 ENCOUNTER — Emergency Department (HOSPITAL_COMMUNITY): Payer: Medicare Other

## 2017-06-29 ENCOUNTER — Emergency Department (HOSPITAL_COMMUNITY)
Admission: EM | Admit: 2017-06-29 | Discharge: 2017-06-29 | Disposition: A | Discharge status: Home / Self Care | Payer: Medicare Other | Attending: Emergency Medicine | Admitting: Emergency Medicine

## 2017-06-29 DIAGNOSIS — Z85828 Personal history of other malignant neoplasm of skin: Secondary | ICD-10-CM | POA: Diagnosis not present

## 2017-06-29 DIAGNOSIS — Z87891 Personal history of nicotine dependence: Secondary | ICD-10-CM | POA: Diagnosis not present

## 2017-06-29 DIAGNOSIS — Z79899 Other long term (current) drug therapy: Secondary | ICD-10-CM | POA: Diagnosis not present

## 2017-06-29 DIAGNOSIS — R531 Weakness: Secondary | ICD-10-CM | POA: Diagnosis not present

## 2017-06-29 DIAGNOSIS — I10 Essential (primary) hypertension: Secondary | ICD-10-CM | POA: Insufficient documentation

## 2017-06-29 DIAGNOSIS — I2510 Atherosclerotic heart disease of native coronary artery without angina pectoris: Secondary | ICD-10-CM | POA: Insufficient documentation

## 2017-06-29 DIAGNOSIS — Z7901 Long term (current) use of anticoagulants: Secondary | ICD-10-CM | POA: Insufficient documentation

## 2017-06-29 LAB — BASIC METABOLIC PANEL
ANION GAP: 10 (ref 5–15)
BUN: 18 mg/dL (ref 6–20)
CHLORIDE: 100 mmol/L — AB (ref 101–111)
CO2: 21 mmol/L — ABNORMAL LOW (ref 22–32)
Calcium: 8.6 mg/dL — ABNORMAL LOW (ref 8.9–10.3)
Creatinine, Ser: 0.91 mg/dL (ref 0.61–1.24)
GFR calc Af Amer: >60 mL/min (ref >60)
GFR calc non Af Amer: >60 mL/min (ref >60)
GLUCOSE: 135 mg/dL — AB (ref 65–99)
POTASSIUM: 3.9 mmol/L (ref 3.5–5.1)
SODIUM: 131 mmol/L — AB (ref 135–145)

## 2017-06-29 LAB — CBC WITH DIFFERENTIAL/PLATELET
BASOS ABS: 0 10*3/uL (ref 0.0–0.1)
Basophils Relative: 0 %
EOS ABS: 0 10*3/uL (ref 0.0–0.7)
EOS PCT: 0 %
HCT: 40.3 % (ref 39.0–52.0)
Hemoglobin: 14.3 g/dL (ref 13.0–17.0)
Lymphocytes Relative: 17 %
Lymphs Abs: 0.8 10*3/uL (ref 0.7–4.0)
MCH: 31.7 pg (ref 26.0–34.0)
MCHC: 35.5 g/dL (ref 30.0–36.0)
MCV: 89.4 fL (ref 78.0–100.0)
Monocytes Absolute: 0.5 10*3/uL (ref 0.1–1.0)
Monocytes Relative: 10 %
Neutro Abs: 3.4 10*3/uL (ref 1.7–7.7)
Neutrophils Relative %: 73 %
PLATELETS: 186 10*3/uL (ref 150–400)
RBC: 4.51 MIL/uL (ref 4.22–5.81)
RDW: 13.4 % (ref 11.5–15.5)
WBC: 4.7 10*3/uL (ref 4.0–10.5)

## 2017-06-29 LAB — URINALYSIS, ROUTINE W REFLEX MICROSCOPIC
BILIRUBIN URINE: NEGATIVE
Glucose, UA: NEGATIVE mg/dL
Hgb urine dipstick: NEGATIVE
KETONES UR: NEGATIVE mg/dL
LEUKOCYTES UA: NEGATIVE
NITRITE: NEGATIVE
Protein, ur: NEGATIVE mg/dL
SPECIFIC GRAVITY, URINE: 1.016 (ref 1.005–1.030)
pH: 6 (ref 5.0–8.0)

## 2017-06-29 LAB — HEPATIC FUNCTION PANEL
ALBUMIN: 3.6 g/dL (ref 3.5–5.0)
ALT: 16 U/L — ABNORMAL LOW (ref 17–63)
AST: 24 U/L (ref 15–41)
Alkaline Phosphatase: 48 U/L (ref 38–126)
Bilirubin, Direct: 0.1 mg/dL (ref 0.1–0.5)
Indirect Bilirubin: 0.9 mg/dL (ref 0.3–0.9)
TOTAL PROTEIN: 6.5 g/dL (ref 6.5–8.1)
Total Bilirubin: 1 mg/dL (ref 0.3–1.2)

## 2017-06-29 LAB — LIPASE, BLOOD: LIPASE: 26 U/L (ref 11–51)

## 2017-06-29 LAB — CBG MONITORING, ED: GLUCOSE-CAPILLARY: 136 mg/dL — AB (ref 65–99)

## 2017-06-29 LAB — LACTIC ACID, PLASMA: LACTIC ACID, VENOUS: 1.6 mmol/L (ref 0.5–1.9)

## 2017-06-29 LAB — TROPONIN I

## 2017-06-29 MED ORDER — SODIUM CHLORIDE 0.9 % IV BOLUS (SEPSIS)
500.0000 mL | Freq: Once | INTRAVENOUS | Status: AC
  Administered 2017-06-29: 500 mL via INTRAVENOUS

## 2017-06-29 NOTE — ED Notes (Signed)
Pt given water per EDP [place]. Unable to provide urine specimen at this time.

## 2017-06-29 NOTE — Discharge Instructions (Signed)
Take your usual prescriptions as previously directed.  Increase your fluid intake (ie:  Gatoraide) when you are outside in the heat.  Call your regular medical doctor Monday to schedule a follow up appointment in the next 2 days.  Return to the Emergency Department immediately sooner if worsening.

## 2017-06-29 NOTE — ED Notes (Signed)
Pt unable to provide urine specimen at this time

## 2017-06-29 NOTE — ED Triage Notes (Signed)
Pt c/o gradual generalized weakness since yesterday evening. With nausea. Denies v/d/dizziness or sob or any pain .

## 2017-06-29 NOTE — ED Provider Notes (Signed)
[facility] DEPT Provider Note   CSN: [id number] Arrival date & time: 06/29/17  1024     History   Chief Complaint Chief Complaint  Patient presents with  . Weakness    HPI Sean Miranda is a 83 y.o. male.  HPI  Pt was seen at 1100. Per pt, c/o gradual onset and persistence of constant generalized weakness that began yesterday. Has been associated with nausea. Denies any other symptoms. Denies vomiting/diarrhea, no abd pain, no back pain, no CP/palpitations, no SOB/cough, no fevers, no rash, no focal motor weakness, no tinging/numbness in extremities.    Past Medical History:  Diagnosis Date  . Arthritis    neck   . Atrial fibrillation ([place])    a. new-onset in 01/2017. Started on Eliquis  . CAD (coronary artery disease), native coronary artery 05/16/2017   LAD 95%>>0 w/ 2.5 x 16 mm Synergy DES, D2 40%, RCA 50%, EF 55-65%, LVEDP mod elevated  . Calcification of coronary artery    a. mild, nonobstructive CAD by cath in 2009.  Marland Kitchen Cancer of skin of chest   . Carotid artery occlusion    a. s/p R CEA in 2014  . Colon polyps   . Hyperlipidemia    takes Pravastatin every other day  . Pneumonia ~ 01/2013    Patient Active Problem List   Diagnosis Date Noted  . S/P primary angioplasty with coronary stent 06/12/2017  . Unstable angina ([place]) 05/16/2017  . Coronary artery disease involving native coronary artery of native heart without angina pectoris   . Chest pain 05/14/2017  . Other fatigue 04/16/2017  . Pre-procedure lab exam 04/16/2017  . Blood clotting disorder ([place]) 04/16/2017  . Atrial fibrillation ([place]) 02/21/2017  . 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 Hemoptysis 02/04/2015  . Aftercare following surgery of the circulatory system, [place] 05/28/2014  . Essential hypertension 05/27/2014  . Exertional chest pain 05/27/2014  . Carotid stenosis 11/10/2013  . Occlusion and stenosis of carotid artery without mention of cerebral infarction 11/06/2013  .  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 Edema 04/02/2013  . Coronary atherosclerosis 11/03/2010  . Carotid artery disease ([place]) 12/26/2009  . Hyperlipidemia 08/12/2008  . COLONIC POLYPS, HX OF 06/03/2007    Past Surgical History:  Procedure Laterality Date  . CARDIAC CATHETERIZATION  06/09/1999   mild CAD (LAD, diagonal, RCA) - Dr. Loni Muse. [place]  . CARDIOVERSION N/A 03/25/2017   Procedure: CARDIOVERSION;  Surgeon: Pixie Casino, MD;  Location: [facility] ENDOSCOPY;  Service: Cardiovascular;  Laterality: N/A;  . CAROTID DOPPLER  02/2012   60-79% RICA stenosis, 43-32% LICA stenosis (prior to carotid endarterectomy)  . COLONOSCOPY    . CORONARY STENT INTERVENTION N/A 05/16/2017   Procedure: Coronary Stent Intervention;  Surgeon: Jettie Booze, MD;  Location: [place] CV LAB;  Service: Cardiovascular;  Laterality: N/A;  . ENDARTERECTOMY Right 11/10/2013   Procedure: ENDARTERECTOMY CAROTID;  Surgeon: Conrad Musselshell, MD;  Location: [facility];  Service: Vascular;  Laterality: Right;  . INGUINAL HERNIA REPAIR Left   . LEFT HEART CATH AND CORONARY ANGIOGRAPHY N/A 05/16/2017   Procedure: Left Heart Cath and Coronary Angiography;  Surgeon: Jettie Booze, MD;  Location: [place] CV LAB;  Service: Cardiovascular;  Laterality: N/A;  . MOLE REMOVAL  1990s   "chest"  . NM MYOCAR PERF WALL MOTION  04/2011   bruce myoview - perfusion defect in inferior myocardium (diaphragmatic attenuation), remaining myocardium with normal perfusion, EF 67%  . PATCH ANGIOPLASTY Right 11/10/2013  Procedure: PATCH ANGIOPLASTY;  Surgeon: Conrad , MD;  Location: [place];  Service: Vascular;  Laterality: Right;  . PILONIDAL CYST DRAINAGE    . SHOULDER OPEN ROTATOR CUFF REPAIR Bilateral 1998 - 2001  . SKIN CANCER EXCISION  ~ 2015   "chest"  . TONSILLECTOMY         Home Medications    Prior to Admission medications   Medication Sig Start Date End Date Taking? Authorizing Provider  ALPHA LIPOIC ACID PO Take 300 mg by  mouth daily.   Yes [provider]  apixaban (ELIQUIS) 5 MG TABS tablet Take 1 tablet (5 mg total) by mouth 2 (two) times daily. 02/22/17  Yes Bhagat, Bhavinkumar, PA  Cholecalciferol (VITAMIN D3) 2000 UNITS capsule Take 2,000 Units by mouth daily.    Yes [provider]  Coenzyme Q10-Levocarnitine 100-20 MG CAPS Take 1 capsule by mouth daily.    Yes [provider]  Glucosamine-Chondroitin 500-400 MG CAPS Take 1 capsule by mouth daily.    Yes [provider]  Magnesium 500 MG TABS Take 1 tablet by mouth daily.   Yes [provider]  Multiple Vitamin (MULTIVITAMIN) tablet Take 0.5 tablets by mouth daily.    Yes [provider]  nitroGLYCERIN (NITROSTAT) 0.4 MG SL tablet Place 1 tablet (0.4 mg total) under the tongue every 5 (five) minutes as needed for chest pain. 05/17/17  Yes Barrett, Rhonda G, PA-C  OMEGA-3 1000 MG CAPS Take 4,000 mg by mouth daily.   Yes [provider]  pravastatin (PRAVACHOL) 40 MG tablet Take 0.5 tablets (20 mg total) by mouth every Monday, Wednesday, and Friday. 05/17/17  Yes Barrett, Evelene Croon, PA-C  Probiotic Product (PROBIOTIC DAILY PO) Take 1 capsule by mouth daily.   Yes [provider]  sildenafil (REVATIO) 20 MG tablet TAKE 2-3 TABLETS DAILY AS NEEDED FOR ERECTILE DYSFUNCTION. 11/20/16  Yes Marletta Lor, MD    Family History Family History  Problem Relation Age of Onset  . Stroke Father   . Hypertension Mother   . Prostate cancer Son   . Stroke Brother   . Cancer Brother        spine    Social History Social History  Substance Use Topics  . Smoking status: Former Smoker    Packs/day: 1.00    Years: 18.00    Types: Cigarettes    Quit date: 12/03/1972  . Smokeless tobacco: Never Used  . Alcohol use 8.4 oz/week    7 Glasses of wine, 7 Shots of liquor per week     Allergies   Lipitor [atorvastatin calcium] and Vantin [cefpodoxime]   Review of Systems Review of  Systems ROS: Statement: All systems negative except as marked or noted in the HPI; Constitutional: Negative for fever and chills. +generalized weakness/fatigue; ; Eyes: Negative for eye pain, redness and discharge. ; ; ENMT: Negative for ear pain, hoarseness, nasal congestion, sinus pressure and sore throat. ; ; Cardiovascular: Negative for chest pain, palpitations, diaphoresis, dyspnea and peripheral edema. ; ; Respiratory: Negative for cough, wheezing and stridor. ; ; Gastrointestinal: Negative for nausea, vomiting, diarrhea, abdominal pain, blood in stool, hematemesis, jaundice and rectal bleeding. . ; ; Genitourinary: Negative for dysuria, flank pain and hematuria. ; ; Musculoskeletal: Negative for back pain and neck pain. Negative for swelling and trauma.; ; Skin: Negative for pruritus, rash, abrasions, blisters, bruising and skin lesion.; ; Neuro: Negative for headache, lightheadedness and neck stiffness. Negative for altered level of consciousness, altered mental  status, extremity weakness, paresthesias, involuntary movement, seizure and syncope.       Physical Exam Updated Vital Signs BP (!) 117/54   Pulse (!) 53   Temp 98.6 F (37 C) (Oral)   Resp 18   SpO2 97%    12:00 Orthostatic Vital Signs VB  Orthostatic Lying   BP- Lying: 101/47  Pulse- Lying: 51      Orthostatic Sitting  BP- Sitting: 108/42  Pulse- Sitting: 53      Orthostatic Standing at 0 minutes  BP- Standing at 0 minutes: 108/51  Pulse- Standing at 0 minutes: 60    Patient Vitals for the past 24 hrs:  BP Temp Temp src Pulse Resp SpO2  06/29/17 1348 (!) 117/54 - - (!) 53 18 97 %  06/29/17 1230 (!) 103/50 - - (!) 54 18 96 %  06/29/17 1204 (!) 108/51 - - (!) 56 20 97 %  06/29/17 1034 (!) 120/49 98.6 F (37 C) Oral (!) 57 19 95 %     Physical Exam 1105: Physical examination:  Nursing notes reviewed; Vital signs and O2 SAT reviewed;  Constitutional: Well developed, Well nourished, Well hydrated, In no acute  distress; Head:  Normocephalic, atraumatic; Eyes: EOMI, PERRL, No scleral icterus; ENMT: Mouth and pharynx normal, Mucous membranes moist; Neck: Supple, Full range of motion, No lymphadenopathy; Cardiovascular: Regular rate and rhythm, No gallop; Respiratory: Breath sounds clear & equal bilaterally, No wheezes.  Speaking full sentences with ease, Normal respiratory effort/excursion; Chest: Nontender, Movement normal; Abdomen: Soft, Nontender, Nondistended, Normal bowel sounds; Genitourinary: No CVA tenderness; Extremities: Pulses normal, No tenderness, No edema, No calf edema or asymmetry.; Neuro: AA&Ox3, Major CN grossly intact. No facial droop. Speech clear. No gross focal motor or sensory deficits in extremities.; Skin: Color normal, Warm, Dry.   ED Treatments / Results  Labs (all labs ordered are listed, but only abnormal results are displayed)   EKG  EKG Interpretation  Date/Time:  Saturday June 29 2017 10:49:44 EDT Ventricular Rate:  61 PR Interval:    QRS Duration: 113 QT Interval:  423 QTC Calculation: 427 R Axis:   80 Text Interpretation:  Sinus arrhythmia Borderline intraventricular conduction delay When compared with ECG of 08/07/2003 No significant change was found Confirmed by [facility]  MD, Nunzio Cory [phone]) on 06/29/2017 1:55:47 PM       Radiology   Procedures Procedures (including critical care time)  Medications Ordered in ED Medications  sodium chloride 0.9 % bolus 500 mL (500 mLs Intravenous New Bag/Given 06/29/17 1323)     Initial Impression / Assessment and Plan / ED Course  I have reviewed the triage vital signs and the nursing notes.  Pertinent labs & imaging results that were available during my care of the patient were reviewed by me and considered in my medical decision making (see chart for details).  MDM Reviewed: previous chart, nursing note and vitals Reviewed previous: labs and ECG Interpretation: labs, ECG and x-ray   Results for orders placed or  performed during the hospital encounter of 62/70/35  Basic metabolic panel  Result Value Ref Range   Sodium 131 (L) 135 - 145 mmol/L   Potassium 3.9 3.5 - 5.1 mmol/L   Chloride 100 (L) 101 - 111 mmol/L   CO2 21 (L) 22 - 32 mmol/L   Glucose, Bld 135 (H) 65 - 99 mg/dL   BUN 18 6 - 20 mg/dL   Creatinine, Ser 0.91 0.61 - 1.24 mg/dL   Calcium 8.6 (L) 8.9 - 10.3  mg/dL   GFR calc non Af Amer >60 >60 mL/min   GFR calc Af Amer >60 >60 mL/min   Anion gap 10 5 - 15  Urinalysis, Routine w reflex microscopic  Result Value Ref Range   Color, Urine AMBER (A) YELLOW   APPearance HAZY (A) CLEAR   Specific Gravity, Urine 1.016 1.005 - 1.030   pH 6.0 5.0 - 8.0   Glucose, UA NEGATIVE NEGATIVE mg/dL   Hgb urine dipstick NEGATIVE NEGATIVE   Bilirubin Urine NEGATIVE NEGATIVE   Ketones, ur NEGATIVE NEGATIVE mg/dL   Protein, ur NEGATIVE NEGATIVE mg/dL   Nitrite NEGATIVE NEGATIVE   Leukocytes, UA NEGATIVE NEGATIVE  CBC with Differential  Result Value Ref Range   WBC 4.7 4.0 - 10.5 K/uL   RBC 4.51 4.22 - 5.81 MIL/uL   Hemoglobin 14.3 13.0 - 17.0 g/dL   HCT 40.3 39.0 - 52.0 %   MCV 89.4 78.0 - 100.0 fL   MCH 31.7 26.0 - 34.0 pg   MCHC 35.5 30.0 - 36.0 g/dL   RDW 13.4 11.5 - 15.5 %   Platelets 186 150 - 400 K/uL   Neutrophils Relative % 73 %   Neutro Abs 3.4 1.7 - 7.7 K/uL   Lymphocytes Relative 17 %   Lymphs Abs 0.8 0.7 - 4.0 K/uL   Monocytes Relative 10 %   Monocytes Absolute 0.5 0.1 - 1.0 K/uL   Eosinophils Relative 0 %   Eosinophils Absolute 0.0 0.0 - 0.7 K/uL   Basophils Relative 0 %   Basophils Absolute 0.0 0.0 - 0.1 K/uL  Troponin I  Result Value Ref Range   Troponin I <0.03 <0.03 ng/mL  Lactic acid, plasma  Result Value Ref Range   Lactic Acid, Venous 1.6 0.5 - 1.9 mmol/L  Hepatic function panel  Result Value Ref Range   Total Protein 6.5 6.5 - 8.1 g/dL   Albumin 3.6 3.5 - 5.0 g/dL   AST 24 15 - 41 U/L   ALT 16 (L) 17 - 63 U/L   Alkaline Phosphatase 48 38 - 126 U/L   Total  Bilirubin 1.0 0.3 - 1.2 mg/dL   Bilirubin, Direct 0.1 0.1 - 0.5 mg/dL   Indirect Bilirubin 0.9 0.3 - 0.9 mg/dL  Lipase, blood  Result Value Ref Range   Lipase 26 11 - 51 U/L  CBG monitoring, ED  Result Value Ref Range   Glucose-Capillary 136 (H) 65 - 99 mg/dL   Dg Chest 2 View Result Date: 06/29/2017 CLINICAL DATA:  Generalized weakness with nausea. History of atrial fibrillation EXAM: CHEST  2 VIEW COMPARISON:  05/14/2017 FINDINGS: Normal heart size and vascularity. Mild hyperinflation without focal pneumonia, collapse or consolidation. Negative for edema, effusion or pneumothorax. Trachea is midline. Minor thoracic degenerative change. No acute osseous finding. IMPRESSION: Stable exam.  No acute chest process. Electronically Signed   By: Jerilynn Mages.  Shick M.D.   On: 06/29/2017 12:32    1520: Pt orthostatic on VS; judicious IVF given. Workup reassuring. Upon my re-evaluation, pt now states his symptoms began after he was "out golfing in the heat" and "sweating a lot." Continues to deny CP/SOB while golfing or at any time since.  Pt has tol PO well while in the ED without N/V. Pt has ambulated with steady gait, easy resps, NAD, Sats 96-98% R/A. Pt states he feels better and wants to go home now. Workup reassuring; no clear indication for admission. Dx and testing d/w pt and family.  Questions answered.  Verb understanding, agreeable  to d/c home with outpt f/u.   Final Clinical Impressions(s) / ED Diagnoses   Final diagnoses:  None    New Prescriptions New Prescriptions   No medications on file     Francine Graven, DO 07/04/17 8299

## 2017-06-29 NOTE — ED Notes (Signed)
Pt ambulated well. O2 stayed 96-98.

## 2017-06-30 LAB — URINE CULTURE: Culture: <10000 — AB

## 2017-07-02 ENCOUNTER — Ambulatory Visit (INDEPENDENT_AMBULATORY_CARE_PROVIDER_SITE_OTHER): Payer: Medicare Other | Admitting: Internal Medicine

## 2017-07-02 ENCOUNTER — Encounter: Payer: Self-pay | Admitting: Internal Medicine

## 2017-07-02 VITALS — BP 128/66 | HR 57 | Temp 97.5°F | Ht 71.0 in | Wt 186.4 lb

## 2017-07-02 DIAGNOSIS — I480 Paroxysmal atrial fibrillation: Secondary | ICD-10-CM

## 2017-07-02 DIAGNOSIS — Z955 Presence of coronary angioplasty implant and graft: Secondary | ICD-10-CM | POA: Diagnosis not present

## 2017-07-02 DIAGNOSIS — R531 Weakness: Secondary | ICD-10-CM

## 2017-07-02 DIAGNOSIS — I2510 Atherosclerotic heart disease of native coronary artery without angina pectoris: Secondary | ICD-10-CM

## 2017-07-02 NOTE — Patient Instructions (Signed)
Cardiology follow-up as scheduled  Return here as scheduled for your annual exam  Limit your sodium (Salt) intake  Please check your blood pressure on a regular basis.  If it is consistently greater than 150/90, please make an office appointment.  Report any new or worsening symptoms

## 2017-07-02 NOTE — Progress Notes (Signed)
Subjective:    Patient ID: Sean Miranda, male    DOB: December 07, 1929, 83 y.o.   MRN: [id number]  HPI  83 year old patient who is seen today following a recent ED visit.  He presented 3 days ago with weakness.  He does have a history of coronary artery disease and has had a fairly recent stent placed.  He has atrial fibrillation and remains on anticoagulation. The patient played golf the day before.  ED evaluation was unremarkable.  Patient feels that he is now back to baseline and anticipates playing golf in the next day or 2.  He has had no chest pain His rhythm has been stable He is followed by cardiology with coronary and carotid artery disease.  He is status post right CEA  ED records reviewed  Past Medical History:  Diagnosis Date  . Arthritis    neck   . Atrial fibrillation ([place])    a. new-onset in 01/2017. Started on Eliquis  . CAD (coronary artery disease), native coronary artery 05/16/2017   LAD 95%>>0 w/ 2.5 x 16 mm Synergy DES, D2 40%, RCA 50%, EF 55-65%, LVEDP mod elevated  . Calcification of coronary artery    a. mild, nonobstructive CAD by cath in 2009.  Marland Kitchen Cancer of skin of chest   . Carotid artery occlusion    a. s/p R CEA in 2014  . Colon polyps   . Hyperlipidemia    takes Pravastatin every other day  . Pneumonia ~ 01/2013     Social History   Social History  . Marital status: Married    Spouse name: N/A  . Number of children: 3  . Years of education: N/A   Occupational History  .  [place] History Main Topics  . Smoking status: Former Smoker    Packs/day: 1.00    Years: 18.00    Types: Cigarettes    Quit date: 12/03/1972  . Smokeless tobacco: Never Used  . Alcohol use 8.4 oz/week    7 Glasses of wine, 7 Shots of liquor per week  . Drug use: No  . Sexual activity: Yes   Other Topics Concern  . Not on file   Social History Narrative  . No narrative on file    Past Surgical History:  Procedure Laterality Date  . CARDIAC  CATHETERIZATION  06/09/1999   mild CAD (LAD, diagonal, RCA) - Dr. Loni Muse. [place]  . CARDIOVERSION N/A 03/25/2017   Procedure: CARDIOVERSION;  Surgeon: Pixie Casino, MD;  [facility] ENDOSCOPY;  Service: Cardiovascular;  Laterality: N/A;  . CAROTID DOPPLER  02/2012   60-79% RICA stenosis, 00-17% LICA stenosis (prior to carotid endarterectomy)  . COLONOSCOPY    . CORONARY STENT INTERVENTION N/A 05/16/2017   Procedure: Coronary Stent Intervention;  Surgeon: Jettie Booze, MD;  Location: [place] [facility] LAB;  Service: Cardiovascular;  Laterality: N/A;  . ENDARTERECTOMY Right 11/10/2013   Procedure: ENDARTERECTOMY CAROTID;  Surgeon: Conrad Point, MD;  Location: [place];  Service: Vascular;  Laterality: Right;  . INGUINAL HERNIA REPAIR Left   . LEFT HEART CATH AND CORONARY ANGIOGRAPHY N/A 05/16/2017   Procedure: Left Heart Cath and Coronary Angiography;  Surgeon: Jettie Booze, MD;  Location: [place] CV LAB;  Service: Cardiovascular;  Laterality: N/A;  . MOLE REMOVAL  1990s   "chest"  . NM MYOCAR PERF WALL MOTION  04/2011   bruce myoview - perfusion defect in inferior myocardium (diaphragmatic attenuation), remaining myocardium with normal perfusion,  EF 67%  . PATCH ANGIOPLASTY Right 11/10/2013   Procedure: PATCH ANGIOPLASTY;  Surgeon: Conrad Winthrop, MD;  Location: [place];  Service: Vascular;  Laterality: Right;  . PILONIDAL CYST DRAINAGE    . SHOULDER OPEN ROTATOR CUFF REPAIR Bilateral 1998 - 2001  . SKIN CANCER EXCISION  ~ 2015   "chest"  . TONSILLECTOMY      Family History  Problem Relation Age of Onset  . Stroke Father   . Hypertension Mother   . Prostate cancer Son   . Stroke Brother   . Cancer Brother        spine    Allergies  Allergen Reactions  . Lipitor [Atorvastatin Calcium] Other (See Comments)    Muscle pain  . Vantin [Cefpodoxime] Rash    Current Outpatient Prescriptions on File Prior to Visit  Medication Sig Dispense Refill  . ALPHA LIPOIC ACID PO Take 300 mg  by mouth daily.    Marland Kitchen apixaban (ELIQUIS) 5 MG TABS tablet Take 1 tablet (5 mg total) by mouth 2 (two) times daily. 60 tablet 11  . Cholecalciferol (VITAMIN D3) 2000 UNITS capsule Take 2,000 Units by mouth daily.     . Coenzyme Q10-Levocarnitine 100-20 MG CAPS Take 1 capsule by mouth daily.     . Glucosamine-Chondroitin 500-400 MG CAPS Take 1 capsule by mouth daily.     . Magnesium 500 MG TABS Take 1 tablet by mouth daily.    . Multiple Vitamin (MULTIVITAMIN) tablet Take 0.5 tablets by mouth daily.     . nitroGLYCERIN (NITROSTAT) 0.4 MG SL tablet Place 1 tablet (0.4 mg total) under the tongue every 5 (five) minutes as needed for chest pain. 25 tablet 12  . OMEGA-3 1000 MG CAPS Take 4,000 mg by mouth daily.    . pravastatin (PRAVACHOL) 40 MG tablet Take 0.5 tablets (20 mg total) by mouth every Monday, Wednesday, and Friday. 30 tablet 11  . Probiotic Product (PROBIOTIC DAILY PO) Take 1 capsule by mouth daily.    . sildenafil (REVATIO) 20 MG tablet TAKE 2-3 TABLETS DAILY AS NEEDED FOR ERECTILE DYSFUNCTION. 60 tablet 4   No current facility-administered medications on file prior to visit.     BP 128/66 (BP Location: Left Arm, Patient Position: Sitting, Cuff Size: Normal)   Pulse (!) 57   Temp (!) 97.5 F (36.4 C) (Oral)   Ht 5\' 11"  (1.803 m)   Wt 186 lb 6.4 oz (84.6 kg)   SpO2 99%   BMI 26.00 kg/m    Review of Systems  Constitutional: Positive for fatigue. Negative for appetite change, chills and fever.  HENT: Negative for congestion, dental problem, ear pain, hearing loss, sore throat, tinnitus, trouble swallowing and voice change.   Eyes: Negative for pain, discharge and visual disturbance.  Respiratory: Negative for cough, chest tightness, wheezing and stridor.   Cardiovascular: Negative for chest pain, palpitations and leg swelling.  Gastrointestinal: Negative for abdominal distention, abdominal pain, blood in stool, constipation, diarrhea, nausea and vomiting.  Genitourinary:  Negative for difficulty urinating, discharge, flank pain, genital sores, hematuria and urgency.  Musculoskeletal: Negative for arthralgias, back pain, gait problem, joint swelling, myalgias and neck stiffness.  Skin: Negative for rash.  Neurological: Negative for dizziness, syncope, speech difficulty, weakness, numbness and headaches.  Hematological: Negative for adenopathy. Does not bruise/bleed easily.  Psychiatric/Behavioral: Negative for behavioral problems and dysphoric mood. The patient is not nervous/anxious.        Objective:   Physical Exam  Constitutional: He is oriented  to person, place, and time. He appears well-developed. No distress.  Clinically, looks well Blood pressure stable Pulse 60  HENT:  Head: Normocephalic.  Right Ear: External ear normal.  Left Ear: External ear normal.  Eyes: Conjunctivae and EOM are normal.  Neck: Normal range of motion.  Status post right CEA  Cardiovascular: Normal rate and normal heart sounds.   Rhythm is regular Grade 2/6 systolic murmur  Pulmonary/Chest: Breath sounds normal. He has no wheezes. He has no rales.  Abdominal: Bowel sounds are normal.  Musculoskeletal: Normal range of motion. He exhibits no edema or tenderness.  Neurological: He is alert and oriented to person, place, and time.  Psychiatric: He has a normal mood and affect. His behavior is normal.          Assessment & Plan:   Episode of weakness.  3 days ago.  Symptoms have resolved.  We'll continue to observe.  Patient has resumed his usual activities Coronary artery disease Paroxysmal atrial fibrillation Essential hypertension Dyslipidemia.  Continue statin therapy  Follow cardiology as scheduled Return here as scheduled for annual exam  No change in medical regimen  Nyoka Cowden

## 2017-07-29 ENCOUNTER — Telehealth: Payer: Self-pay | Admitting: Internal Medicine

## 2017-07-29 NOTE — Telephone Encounter (Signed)
Returned call to pt. Told him it is ok to have labs done at [facility]--no appointment needed.  Spoke to [employer]. She said they can see the labs in the system and pt will just have to let front desk know his labs are in the system.  Informed pt who verbalized thanks. He stated he will have his labs done next week.  Routing to [place], Therapist, sports as Juluis Rainier.

## 2017-07-29 NOTE — Telephone Encounter (Signed)
New message   Pt wants to do his blood test at [facility] and wants instructions on when and how to do so.

## 2017-08-06 ENCOUNTER — Telehealth: Payer: Self-pay | Admitting: Internal Medicine

## 2017-08-06 NOTE — Telephone Encounter (Signed)
New Message   pt verbalized that he is calling for rn   he wants to be seen sooner than his recall date   Pt did not want to go any further when asked for a reason

## 2017-08-06 NOTE — Telephone Encounter (Signed)
Returned call to patient of Dr. Debara Pickett regarding request for appt He is planning to have lab work done Wednesday and wants to see MD after to review labs Offered first available with MD which is end of Sept but patient would like to come in on 9/10 Explained that MD is out of the office after this week until end of the month He states he is busy at the end of the month and did not wish to schedule an appt No further assistance needed at this time

## 2017-08-12 ENCOUNTER — Other Ambulatory Visit (HOSPITAL_COMMUNITY)
Admission: RE | Admit: 2017-08-12 | Discharge: 2017-08-12 | Disposition: A | Discharge status: Home / Self Care | Payer: Medicare Other | Source: Ambulatory Visit | Attending: Internal Medicine | Admitting: Internal Medicine

## 2017-08-12 DIAGNOSIS — E785 Hyperlipidemia, unspecified: Secondary | ICD-10-CM | POA: Diagnosis present

## 2017-08-12 LAB — LIPID PANEL
CHOL/HDL RATIO: 5.2 ratio
CHOLESTEROL: 233 mg/dL — AB (ref 0–200)
HDL: 45 mg/dL (ref >40)
LDL Cholesterol: 171 mg/dL — ABNORMAL HIGH (ref 0–99)
TRIGLYCERIDES: 86 mg/dL (ref <150)
VLDL: 17 mg/dL (ref 0–40)

## 2017-08-13 ENCOUNTER — Telehealth: Payer: Self-pay | Admitting: *Deleted

## 2017-08-13 NOTE — Telephone Encounter (Signed)
Left a message for the patient to call back for results:  Notes recorded by Pixie Casino, MD on 08/12/2017 at 9:31 PM EDT Cholesterol remains elevated.  Dr. Lemmie Evens

## 2017-08-22 ENCOUNTER — Encounter: Payer: Self-pay | Admitting: Internal Medicine

## 2017-08-27 ENCOUNTER — Telehealth: Payer: Self-pay

## 2017-08-27 NOTE — Telephone Encounter (Signed)
Patient walked in office requesting recent lab results.Lab results given,cholesterol remains elevated.Advised to continue same medications and follow diet better.

## 2017-10-21 DIAGNOSIS — M545 Low back pain: Secondary | ICD-10-CM | POA: Diagnosis not present

## 2017-11-04 DIAGNOSIS — M545 Low back pain: Secondary | ICD-10-CM | POA: Diagnosis not present

## 2017-11-07 DIAGNOSIS — H40023 Open angle with borderline findings, high risk, bilateral: Secondary | ICD-10-CM | POA: Diagnosis not present

## 2017-11-07 DIAGNOSIS — H53452 Other localized visual field defect, left eye: Secondary | ICD-10-CM | POA: Diagnosis not present

## 2017-11-07 DIAGNOSIS — H25813 Combined forms of age-related cataract, bilateral: Secondary | ICD-10-CM | POA: Diagnosis not present

## 2017-11-15 DIAGNOSIS — M545 Low back pain: Secondary | ICD-10-CM | POA: Diagnosis not present

## 2017-11-19 DIAGNOSIS — M545 Low back pain: Secondary | ICD-10-CM | POA: Diagnosis not present

## 2017-12-02 DIAGNOSIS — C44509 Unspecified malignant neoplasm of skin of other part of trunk: Secondary | ICD-10-CM | POA: Insufficient documentation

## 2017-12-02 DIAGNOSIS — M1990 Unspecified osteoarthritis, unspecified site: Secondary | ICD-10-CM | POA: Insufficient documentation

## 2017-12-02 DIAGNOSIS — K635 Polyp of colon: Secondary | ICD-10-CM | POA: Insufficient documentation

## 2017-12-02 DIAGNOSIS — I2584 Coronary atherosclerosis due to calcified coronary lesion: Secondary | ICD-10-CM

## 2017-12-02 DIAGNOSIS — I2510 Atherosclerotic heart disease of native coronary artery without angina pectoris: Secondary | ICD-10-CM | POA: Insufficient documentation

## 2017-12-02 DIAGNOSIS — I6529 Occlusion and stenosis of unspecified carotid artery: Secondary | ICD-10-CM | POA: Insufficient documentation

## 2017-12-02 DIAGNOSIS — J189 Pneumonia, unspecified organism: Secondary | ICD-10-CM | POA: Insufficient documentation

## 2017-12-10 ENCOUNTER — Ambulatory Visit: Payer: Medicare Other | Admitting: Internal Medicine

## 2017-12-10 ENCOUNTER — Encounter: Payer: Self-pay | Admitting: Internal Medicine

## 2017-12-10 VITALS — BP 132/66 | HR 55 | Ht 71.0 in | Wt 189.0 lb

## 2017-12-10 DIAGNOSIS — E785 Hyperlipidemia, unspecified: Secondary | ICD-10-CM

## 2017-12-10 DIAGNOSIS — I2510 Atherosclerotic heart disease of native coronary artery without angina pectoris: Secondary | ICD-10-CM | POA: Diagnosis not present

## 2017-12-10 DIAGNOSIS — I4891 Unspecified atrial fibrillation: Secondary | ICD-10-CM | POA: Diagnosis not present

## 2017-12-10 NOTE — Progress Notes (Signed)
OFFICE NOTE  Chief Complaint:  Hospital follow-up  Primary Care Physician: Marletta Lor, MD  HPI:  Sean Miranda is a pleasant 84 yo male, previously followed by Dr., Rex Kras, who has calcification of his coronary arteries documented by catheterization in 2009, but no occlusive disease. He had a nuclear study May 2012 that was a low risk and a 67% ejection fraction. He has known carotid disease, had Doppler studies done through [employer] that shows less than 80% on the right and less than 60% on the left, which is an increase from his Doppler study done a year previously, and he is currently supposedly scheduled for repeat study in 6 months. He has had no episodes of angina, no unusual shortness of breath, no awareness of any arrhythmias. He has had no ankle edema. He denies dizziness, lightheadedness, unilateral weakness, slurred speech or blurred vision. He checks his blood pressure regularly at home. It has been under good control. He has no unusual shortness of breath and has had no changes in exercise tolerance or stamina. He had had significant myalgias from Lipitor in the past, and I am a little concerned he may develop recurrent myalgias from the daily dose of Pravachol but I see no reason we cannot try this in view of his lipids which now show an LDL of 142 with an HDL of 51.  Unfortunately he was intolerant of Pravachol and other statins, is not currently taking them. He recently had repeat carotid Dopplers which showed high grade velocities in the right carotid and the upper end of the range of 79%. His carotids are followed that with our imaging and Dr. Leanne Chang is coordinating that.  Sean Miranda returns today and is doing fairly well. He does report some shortness of breath with exertion and some chest tightness which improves after exercise.  The chest tightness is fairly predictable with exercise and relieved by rest. This is concerning for possible angina. He  reports he discontinued his pravastatin about one year ago due to problems with leg cramps. He's also had side effects with Lipitor. Recent cholesterol profile showed total cholesterol 222, triglycerides 82, HDL 43 and LDL 162.  He's also had progression of his right internal carotid artery stenosis and underwent right carotid endarterectomy for greater than 80% stenosis of that artery. He does have moderate stenosis of the left internal carotid.  Sean Miranda was referred for a stress test after his last visit for exertional chest pain. Stress test was negative for ischemia. He returns today without complaints. He is due for a repeat doppler in 3-4 months.  04/16/2017  Sean Miranda returns today for follow-up. He is in persistent A. fib and failed recent cardioversion. Stress testing demonstrated no new ischemia. LVEF was normal on echo. We discussed options including antiarrhythmic therapy since he is symptomatic with his A. fib and he is agreeable to start on therapy. EKG today shows atrial fibrillation at 72 with a QTC of 440 ms. He is on metoprolol tartrate 12.5 mg twice a day.  06/12/2017  Sean Miranda returns for follow-up. He recently underwent a stress test which showed no ischemia and was started on flecainide. Unfortunately then developed acute chest pain and found to have non-STEMI. He underwent recatheterization on 05/16/2017, this demonstrated the following:    Mid RCA lesion, 50 %stenosed.  Ost RCA lesion, 25 %stenosed. Lesion noted in small RV marginal.  LM lesion, 25 %stenosed.  2nd Diag lesion, 40 %stenosed.  Mid LAD-1 lesion, 20 %  stenosed.  Mid LAD-2 lesion, 95 %stenosed.  A STENT SYNERGY DES 2.5X16 drug eluting stent was successfully placed, postdilated to 3.0 mm.  Post intervention, there is a 0% residual stenosis.  The left ventricular systolic function is normal.  LV end diastolic pressure is moderately elevated.  The left ventricular ejection fraction is 55-65% by  visual estimate.  There is no aortic valve stenosis.   LAD was the culprit for his unstable angina. He had symptoms during the procedure at rest. These resolved after stent placement. Watch overnight. Continue IV Angiomax for another hour.  Loaded with Plavix today. We'll start 75 mg daily tomorrow. Given aspirin today as well. Would plan for aspirin, Plavix and Eliquis for 30 days. After 30 days, would stop the aspirin and continue Plavix and Eliquis. A synergy drug-eluting stent was used since he is on long-term anticoagulation. If there is a bleeding complication, his antiplatelet therapy could be stopped sooner than usual. Ideally, he would get 6-12 months of Plavix depending on his bleeding risk profile.   Today he returns for follow-up. He seems to be doing well. He denies any further chest pain or worsening shortness of breath. He's had no recurrent A. fib and is back in sinus rhythm. We have discontinued flecainide due to his active coronary artery disease as it is conscious indicated. He remains on aspirin, Plavix and Eliquis. The plan was to stay on all 3 for one month and then switched to Plavix and Eliquis. Of note his recent lipid profile shows very poor control. His LDL-C was 143 with total cholesterol 194, triglycerides 63 and HDL 38. He reportedly has pravastatin 20 mg 3 times weekly. There is no evidence to support this dosing combination with reduction of cardiovascular risk. He is unwilling to take any other statins due to his concerns about side effects, memory loss, muscle pain, etc. He says he does "extensive reading on this".  12/10/2017  Sean Miranda returns today for follow-up.  He had a repeat lipid testing in September which showed total cholesterol 233, triglycerides 86, HDL 45 and LDL 171.  This is up from an LDL 143 9 months ago.  He was previously taking pravastatin daily now he takes only 10 mg every other day.  He is concerned about myalgias and about memory loss, but has  not really had either one.  He did have side effects with atorvastatin which she thought had caused myalgias.  Again we discussed PCS K9 inhibitors, for which I think is a good candidate.  PMHx:  Past Medical History:  Diagnosis Date  . Arthritis    neck   . Atrial fibrillation ([place])    a. new-onset in 01/2017. Started on Eliquis  . CAD (coronary artery disease), native coronary artery 05/16/2017   LAD 95%>>0 w/ 2.5 x 16 mm Synergy DES, D2 40%, RCA 50%, EF 55-65%, LVEDP mod elevated  . Calcification of coronary artery    a. mild, nonobstructive CAD by cath in 2009.  Marland Kitchen Cancer of skin of chest   . Carotid artery occlusion    a. s/p R CEA in 2014  . Colon polyps   . Hyperlipidemia    takes Pravastatin every other day  . Pneumonia ~ 01/2013    Past Surgical History:  Procedure Laterality Date  . CARDIAC CATHETERIZATION  06/09/1999   mild CAD (LAD, diagonal, RCA) - Dr. Loni Muse. [place]  . CARDIOVERSION N/A 03/25/2017   Procedure: CARDIOVERSION;  Surgeon: Pixie Casino, MD;  Location: [facility] ENDOSCOPY;  Service: Cardiovascular;  Laterality: N/A;  . CAROTID DOPPLER  02/2012   60-79% RICA stenosis, 82-95% LICA stenosis (prior to carotid endarterectomy)  . COLONOSCOPY    . CORONARY STENT INTERVENTION N/A 05/16/2017   Procedure: Coronary Stent Intervention;  Surgeon: Jettie Booze, MD;  Location: [place] CV LAB;  Service: Cardiovascular;  Laterality: N/A;  . ENDARTERECTOMY Right 11/10/2013   Procedure: ENDARTERECTOMY CAROTID;  Surgeon: Conrad Emmons, MD;  Location: [place];  Service: Vascular;  Laterality: Right;  . INGUINAL HERNIA REPAIR Left   . LEFT HEART CATH AND CORONARY ANGIOGRAPHY N/A 05/16/2017   Procedure: Left Heart Cath and Coronary Angiography;  Surgeon: Jettie Booze, MD;  Location: [place] [facility];  Service: Cardiovascular;  Laterality: N/A;  . MOLE REMOVAL  1990s   "chest"  . NM MYOCAR PERF WALL MOTION  04/2011   bruce myoview - perfusion defect in inferior myocardium  (diaphragmatic attenuation), remaining myocardium with normal perfusion, EF 67%  . PATCH ANGIOPLASTY Right 11/10/2013   Procedure: PATCH ANGIOPLASTY;  Surgeon: Conrad Vining, MD;  Location: [place];  Service: Vascular;  Laterality: Right;  . PILONIDAL CYST DRAINAGE    . SHOULDER OPEN ROTATOR CUFF REPAIR Bilateral 1998 - 2001  . SKIN CANCER EXCISION  ~ 2015   "chest"  . TONSILLECTOMY      FAMHx:  Family History  Problem Relation Age of Onset  . Stroke Father   . Hypertension Mother   . Prostate cancer Son   . Stroke Brother   . Cancer Brother        spine    SOCHx:   reports that he quit smoking about 45 years ago. His smoking use included cigarettes. He has a 18.00 pack-year smoking history. he has never used smokeless tobacco. He reports that he drinks about 8.4 oz of alcohol per week. He reports that he does not use drugs.  ALLERGIES:  Allergies  Allergen Reactions  . Lipitor [Atorvastatin Calcium] Other (See Comments)    Muscle pain  . Vantin [Cefpodoxime] Rash    ROS: Pertinent items noted in HPI and remainder of comprehensive ROS otherwise negative.  HOME MEDS: Current Outpatient Medications  Medication Sig Dispense Refill  . ALPHA LIPOIC ACID PO Take 300 mg by mouth daily.    Marland Kitchen apixaban (ELIQUIS) 5 MG TABS tablet Take 1 tablet (5 mg total) by mouth 2 (two) times daily. 60 tablet 11  . Cholecalciferol (VITAMIN D3) 2000 UNITS capsule Take 2,000 Units by mouth daily.     . clopidogrel (PLAVIX) 75 MG tablet Take 75 mg by mouth daily.    . Coenzyme Q10-Levocarnitine 100-20 MG CAPS Take 1 capsule by mouth daily.     . Glucosamine-Chondroitin 500-400 MG CAPS Take 1 capsule by mouth daily.     . Magnesium 500 MG TABS Take 1 tablet by mouth daily.    . Multiple Vitamin (MULTIVITAMIN) tablet Take 0.5 tablets by mouth daily.     . nitroGLYCERIN (NITROSTAT) 0.4 MG SL tablet Place 1 tablet (0.4 mg total) under the tongue every 5 (five) minutes as needed for chest pain. 25 tablet 12   . OMEGA-3 1000 MG CAPS Take 4,000 mg by mouth daily.    . pravastatin (PRAVACHOL) 40 MG tablet Take 0.5 tablets (20 mg total) by mouth every Monday, Wednesday, and Friday. 30 tablet 11  . Probiotic Product (PROBIOTIC DAILY PO) Take 1 capsule by mouth daily.    . sildenafil (REVATIO) 20 MG tablet TAKE 2-3 TABLETS  DAILY AS NEEDED FOR ERECTILE DYSFUNCTION. 60 tablet 4   No current facility-administered medications for this visit.     LABS/IMAGING: No results found for this or any previous visit (from the past 48 hour(s)). No results found.  VITALS: BP 132/66   Pulse (!) 55   Ht 5\' 11"  (1.803 m)   Wt 189 lb (85.7 kg)   BMI 26.36 kg/m   EXAM: General appearance: alert and no distress Neck: no carotid bruit, no JVD and thyroid not enlarged, symmetric, no tenderness/mass/nodules Lungs: clear to auscultation bilaterally Heart: regular rate and rhythm Abdomen: soft, non-tender; bowel sounds normal; no masses,  no organomegaly Extremities: extremities normal, atraumatic, no cyanosis or edema Pulses: 2+ and symmetric Skin: Skin color, texture, turgor normal. No rashes or lesions Neurologic: Grossly normal Psych: Pleasant  EKG: Sinus bradycardia 55-personally reviewed  ASSESSMENT: 1. Coronary artery disease status post PCI to the mid-LAD (05/2017) 2. Symptomatic persistent a-fib - negative nuclear stress test (04/2017), normal EF on echo 01/2017 3. CHADSVASC score of 4 - on Eliquis 4. History of calcified coronaries with no obstructive disease 5. Moderate left internal carotid artery stenosis, s/p right CEA 6. Dyslipidemia - history of statin intolerance on low dose pravastatin 7. Hypertension-controlled  PLAN: 1.   Mr. Windsor has ASCVD with recent active coronary artery disease (stent to the mid-LAD in 05/2017) and prior CEA with moderate left internal carotid artery stenosis and a goal LDL less than 70.  His current LDL is over 170 on low-dose pravastatin 3 times weekly.  He reports  he is not been able to tolerate any stronger doses of medication.  I think is a good candidate for a PCSK9 inhibitor.  We discussed Repatha today and will likely try to get him on every 2-week dosing.  We will follow-up prior authorization and contact him for further training once it is approved.  Follow-up in 6 months.  Pixie Casino, MD, [facility], [place] Director of the Advanced Lipid Disorders &  Cardiovascular Risk Reduction Clinic Diplomate of the American Board of Clinical Lipidology Attending Cardiologist  Direct Dial: [phone]  Fax: [phone]  Website:  [email or web address] 12/10/2017, 4:47 PM

## 2017-12-10 NOTE — Patient Instructions (Addendum)
Dr. Debara Pickett recommends Repatha (PCSK9). This is an injectable cholesterol medication. This medication will need prior approval with your insurance company, which we will work on. If the medication is not approved initially, we may need to do an appeal with your insurance. We will keep you updated on this process. This medication is provided either from a specialty pharmacy or, if your insurance company allows, a local [facility] or [facility] outpatient pharmacy.   Your physician recommends that you return for lab work in 3-4 months (prior to your next appointment with Dr. Debara Pickett)  Your physician recommends that you schedule a follow-up appointment in May 2019 with Dr. Debara Pickett in the lipid clinic.

## 2017-12-11 ENCOUNTER — Telehealth: Payer: Self-pay | Admitting: Internal Medicine

## 2017-12-11 NOTE — Telephone Encounter (Signed)
[place] (Key: [id number]) Repatha SureClick 140MG Marguerita Merles [place] SOAJ Status: PA Request Created: January 9th, 2019 Sent: January 9th, 2019

## 2017-12-16 DIAGNOSIS — L218 Other seborrheic dermatitis: Secondary | ICD-10-CM | POA: Diagnosis not present

## 2017-12-16 DIAGNOSIS — D225 Melanocytic nevi of trunk: Secondary | ICD-10-CM | POA: Diagnosis not present

## 2017-12-16 DIAGNOSIS — L821 Other seborrheic keratosis: Secondary | ICD-10-CM | POA: Diagnosis not present

## 2017-12-16 DIAGNOSIS — Z85828 Personal history of other malignant neoplasm of skin: Secondary | ICD-10-CM | POA: Diagnosis not present

## 2017-12-16 DIAGNOSIS — M545 Low back pain: Secondary | ICD-10-CM | POA: Diagnosis not present

## 2017-12-16 NOTE — Telephone Encounter (Signed)
[place] Key: [id number] Outcome  Approved on January 11  Effective from 12/11/2017 through 12/11/2018.  DrugRepatha SureClick 140MG /ML [place] SOAJ

## 2017-12-17 MED ORDER — EVOLOCUMAB 140 MG/ML ~~LOC~~ SOAJ: 1.0000 | SUBCUTANEOUS | 5 refills | Status: DC

## 2017-12-17 NOTE — Telephone Encounter (Signed)
Patient called and made aware that Repatha was approved. He wishes for Rx to be sent to [facility]. Advised that patient contact his pharmacy this afternoon to inquire about costs, at which time he should call our office or send message via MyChart with this info and then we can proceed with patient assistance (if necessary). He voiced understanding.

## 2017-12-17 NOTE — Addendum Note (Signed)
Addended by: Fidel Levy on: 12/17/2017 11:25 AM   Modules accepted: Orders

## 2017-12-20 NOTE — Telephone Encounter (Signed)
MyChart message sent to patient to follow up on PCSK9

## 2017-12-20 NOTE — Telephone Encounter (Signed)
[place] - patient will have to pay around $400 initially for Repatha Co-pay $37/month after meets drug deductible

## 2018-01-02 DIAGNOSIS — M25551 Pain in right hip: Secondary | ICD-10-CM | POA: Diagnosis not present

## 2018-01-02 DIAGNOSIS — M545 Low back pain: Secondary | ICD-10-CM | POA: Diagnosis not present

## 2018-01-02 DIAGNOSIS — M7071 Other bursitis of hip, right hip: Secondary | ICD-10-CM | POA: Diagnosis not present

## 2018-01-06 ENCOUNTER — Encounter: Payer: Self-pay | Admitting: Family

## 2018-01-06 ENCOUNTER — Ambulatory Visit (INDEPENDENT_AMBULATORY_CARE_PROVIDER_SITE_OTHER): Payer: Medicare Other | Admitting: Family

## 2018-01-06 ENCOUNTER — Ambulatory Visit (HOSPITAL_COMMUNITY)
Admission: RE | Admit: 2018-01-06 | Discharge: 2018-01-06 | Disposition: A | Discharge status: Home / Self Care | Payer: Medicare Other | Source: Ambulatory Visit | Attending: Family | Admitting: Family

## 2018-01-06 VITALS — BP 152/71 | HR 57 | Temp 97.1°F | Resp 17 | Ht 72.0 in | Wt 191.4 lb

## 2018-01-06 DIAGNOSIS — Z9889 Other specified postprocedural states: Secondary | ICD-10-CM

## 2018-01-06 DIAGNOSIS — I779 Disorder of arteries and arterioles, unspecified: Secondary | ICD-10-CM | POA: Insufficient documentation

## 2018-01-06 DIAGNOSIS — I6523 Occlusion and stenosis of bilateral carotid arteries: Secondary | ICD-10-CM | POA: Insufficient documentation

## 2018-01-06 NOTE — Patient Instructions (Signed)

## 2018-01-06 NOTE — Progress Notes (Signed)
Chief Complaint: Follow up Extracranial Carotid Artery Stenosis   History of Present Illness  Sean Miranda is a 84 y.o. male who is s/p R CEA (Date: 11/10/13) by Dr. Bridgett Larsson. He returns today for surveillance and follow up.  The patient denies any history of TIA or stroke symptoms.Specifically he denies a history of amaurosis fugax or monocular blindness, unilateral facial drooping, hemiparesis, or receptive or expressive aphasia.   He denies tingling, numbness, pain, or weakness in either UE, denies dizziness. He denies claudication symptoms with walking, denies non healing wounds.  He reports a strong family hx of strokes.  He exercises 45 minutes at least 3-4 days/week.  Pt had a cardiac stent placed in his LAD in June 2018. Dr. Lysbeth Penner note from July 2018 indicates that pt does not want to takes statins, his LDL was 143, and would consider a PCSK9 inhibitor at his next visit. Pt states he checked with his pharmacy and this medication is too expensive.   Pt Diabetic: No Pt smoker: former smoker, quit in 1974  Pt meds include: Statin : Yes, qod ASA: Yes, 81 mg qod Other anticoagulants/antiplatelets: no    Past Medical History:  Diagnosis Date  . Arthritis    neck   . Atrial fibrillation ([place])    a. new-onset in 01/2017. Started on Eliquis  . CAD (coronary artery disease), native coronary artery 05/16/2017   LAD 95%>>0 w/ 2.5 x 16 mm Synergy DES, D2 40%, RCA 50%, EF 55-65%, LVEDP mod elevated  . Calcification of coronary artery    a. mild, nonobstructive CAD by cath in 2009.  Marland Kitchen Cancer of skin of chest   . Carotid artery occlusion    a. s/p R CEA in 2014  . Colon polyps   . Hyperlipidemia    takes Pravastatin every other day  . Pneumonia ~ 01/2013    Social History Social History   Tobacco Use  . Smoking status: Former Smoker    Packs/day: 1.00    Years: 18.00    Pack years: 18.00    Types: Cigarettes    Last attempt to quit: 12/03/1972    Years since  quitting: 45.1  . Smokeless tobacco: Never Used  Substance Use Topics  . Alcohol use: Yes    Alcohol/week: 8.4 oz    Types: 7 Glasses of wine, 7 Shots of liquor per week  . Drug use: No    Family History Family History  Problem Relation Age of Onset  . Stroke Father   . Hypertension Mother   . Prostate cancer Son   . Stroke Brother   . Cancer Brother        spine    Surgical History Past Surgical History:  Procedure Laterality Date  . CARDIAC CATHETERIZATION  06/09/1999   mild CAD (LAD, diagonal, RCA) - Dr. Loni Muse. [place]  . CARDIOVERSION N/A 03/25/2017   Procedure: CARDIOVERSION;  Surgeon: Pixie Casino, MD;  Location: [facility] ENDOSCOPY;  Service: Cardiovascular;  Laterality: N/A;  . CAROTID DOPPLER  02/2012   60-79% RICA stenosis, 94-76% LICA stenosis (prior to carotid endarterectomy)  . COLONOSCOPY    . CORONARY STENT INTERVENTION N/A 05/16/2017   Procedure: Coronary Stent Intervention;  Surgeon: Jettie Booze, MD;  Location: [place] CV LAB;  Service: Cardiovascular;  Laterality: N/A;  . ENDARTERECTOMY Right 11/10/2013   Procedure: ENDARTERECTOMY CAROTID;  Surgeon: Conrad Phillipsburg, MD;  Location: [place];  Service: Vascular;  Laterality: Right;  . INGUINAL HERNIA REPAIR Left   .  LEFT HEART CATH AND CORONARY ANGIOGRAPHY N/A 05/16/2017   Procedure: Left Heart Cath and Coronary Angiography;  Surgeon: Jettie Booze, MD;  Location: [place] CV LAB;  Service: Cardiovascular;  Laterality: N/A;  . MOLE REMOVAL  1990s   "chest"  . NM MYOCAR PERF WALL MOTION  04/2011   bruce myoview - perfusion defect in inferior myocardium (diaphragmatic attenuation), remaining myocardium with normal perfusion, EF 67%  . PATCH ANGIOPLASTY Right 11/10/2013   Procedure: PATCH ANGIOPLASTY;  Surgeon: Conrad Monroe, MD;  Location: [place];  Service: Vascular;  Laterality: Right;  . PILONIDAL CYST DRAINAGE    . SHOULDER OPEN ROTATOR CUFF REPAIR Bilateral 1998 - 2001  . SKIN CANCER EXCISION  ~ 2015    "chest"  . TONSILLECTOMY      Allergies  Allergen Reactions  . Lipitor [Atorvastatin Calcium] Other (See Comments)    Muscle pain  . Vantin [Cefpodoxime] Rash    Current Outpatient Medications  Medication Sig Dispense Refill  . ALPHA LIPOIC ACID PO Take 300 mg by mouth daily.    Marland Kitchen apixaban (ELIQUIS) 5 MG TABS tablet Take 1 tablet (5 mg total) by mouth 2 (two) times daily. 60 tablet 11  . Cholecalciferol (VITAMIN D3) 2000 UNITS capsule Take 2,000 Units by mouth daily.     . clopidogrel (PLAVIX) 75 MG tablet Take 75 mg by mouth daily.    . Coenzyme Q10-Levocarnitine 100-20 MG CAPS Take 1 capsule by mouth daily.     . Glucosamine-Chondroitin 500-400 MG CAPS Take 1 capsule by mouth daily.     . Magnesium 500 MG TABS Take 1 tablet by mouth daily.    . Multiple Vitamin (MULTIVITAMIN) tablet Take 0.5 tablets by mouth daily.     . nitroGLYCERIN (NITROSTAT) 0.4 MG SL tablet Place 1 tablet (0.4 mg total) under the tongue every 5 (five) minutes as needed for chest pain. 25 tablet 12  . OMEGA-3 1000 MG CAPS Take 4,000 mg by mouth daily.    . pravastatin (PRAVACHOL) 40 MG tablet Take 0.5 tablets (20 mg total) by mouth every Monday, Wednesday, and Friday. 30 tablet 11  . Probiotic Product (PROBIOTIC DAILY PO) Take 1 capsule by mouth daily.    . sildenafil (REVATIO) 20 MG tablet TAKE 2-3 TABLETS DAILY AS NEEDED FOR ERECTILE DYSFUNCTION. 60 tablet 4   No current facility-administered medications for this visit.     Review of Systems : See HPI for pertinent positives and negatives.  Physical Examination  Vitals:   01/06/18 1448 01/06/18 1450  BP: 136/70 (!) 152/71  Pulse: (!) 57   Resp: 17   Temp: (!) 97.1 F (36.2 C)   TempSrc: Oral   SpO2: 98%   Weight: 191 lb 6.4 oz (86.8 kg)   Height: 6' (1.829 m)    Body mass index is 25.96 kg/m.  General: A&O x 3, WD, fit appearing male, appears younger than stated age.  Gait: Normal HENT: No apparent abnormalities  Eyes: PERRLA   Pulmonary: Sym exp, good air movement in all fields, CTAB, no rales, rhonchi, or wheezing  Cardiac: RRR, Nl S1, S2, no detected murmur  Vascular: Vessel  Right  Left   Radial  not Palpable  2+Palpable   Carotid  Palpable, with bruit  Palpable, without bruit   Brachial 2+palpable 2+ palpable  Popliteal  Not palpable  Not palpable   PT  2+Palpable  2+ Palpable   DP  2+Palpable  Not Palpable    Gastrointestinal: soft, NTND, -G/R, -  HSM, - palpable masses, - CVAT B Musculoskeletal: M/S 5/5 throughout , Extremities without ischemic changes  Neurologic: CN 2-12 intact except is hard of hearing, Pain and light touch intact in extremities , Motor exam as listed above  Psychiatric: Judgment intact, Mood &affect appropriate for pt's clinical situation  Dermatologic: See M/S exam for extremity exam, no rashes or ulcers otherwise noted      Assessment: Sean Miranda is a 84 y.o. male  who whois s/p R CEA (Date: 11/10/13). He has no hx of stroke or TIA.  DATA Carotid Duplex (01/06/18): Right carotid endarterectomy site with 1-39% stenosis. Left internal carotid artery velocities suggest a 1-39% stenosis. Abnormal right vertebral artery with increased velocity at the subclavian artery. Normal left vertebral antegrade flow and subclavian artery waveforms.  Bilateral subclavian artery waveforms are normal.  No significant change in comparison to the  exams on 01-04-2016 and 01-04-17.  Plan:  Follow-up in 1year with Carotid Duplex scan   I discussed in depth with the patient the nature of atherosclerosis, and emphasized the importance of maximal medical management including strict control of blood pressure, blood glucose, and lipid levels, obtaining regular exercise, and continued cessation of smoking.  The patient is aware that without maximal medical management the underlying atherosclerotic disease process will progress, limiting the benefit of  any interventions. The patient was given information about stroke prevention and what symptoms should prompt the patient to seek immediate medical care. Thank you for allowing [place] to participate in this patient's care.  Clemon Chambers, RN, MSN, FNP-C [facility] Office: [phone]  Clinic Physician: Trula Slade  01/06/18 2:52 PM

## 2018-01-07 DIAGNOSIS — Z08 Encounter for follow-up examination after completed treatment for malignant neoplasm: Secondary | ICD-10-CM | POA: Diagnosis not present

## 2018-01-07 DIAGNOSIS — Z85831 Personal history of malignant neoplasm of soft tissue: Secondary | ICD-10-CM | POA: Diagnosis not present

## 2018-01-07 DIAGNOSIS — C493 Malignant neoplasm of connective and soft tissue of thorax: Secondary | ICD-10-CM | POA: Diagnosis not present

## 2018-01-08 LAB — VAS US CAROTID
LCCAPSYS: 113 cm/s
LEFT ECA DIAS: -16 cm/s
LEFT VERTEBRAL DIAS: -8 cm/s
LICADDIAS: -19 cm/s
LICAPDIAS: -21 cm/s
LICAPSYS: -88 cm/s
Left CCA dist dias: -17 cm/s
Left CCA dist sys: -92 cm/s
Left CCA prox dias: 24 cm/s
Left ICA dist sys: -87 cm/s
RIGHT CCA MID DIAS: 14 cm/s
RIGHT ECA DIAS: -17 cm/s
RIGHT VERTEBRAL DIAS: -9 cm/s
Right CCA prox dias: 15 cm/s
Right CCA prox sys: 94 cm/s
Right cca dist sys: -94 cm/s

## 2018-01-10 ENCOUNTER — Ambulatory Visit: Payer: Medicare Other | Admitting: Family

## 2018-01-10 ENCOUNTER — Encounter (HOSPITAL_COMMUNITY): Payer: Medicare Other

## 2018-01-28 NOTE — Telephone Encounter (Signed)
Per chart review, Repatha was removed from patient's med list on 01/06/18. Per note: Pt had a cardiac stent placed in his LAD in June 2018. Dr. Lysbeth Penner note from July 2018 indicates that pt does not want to takes statins, his LDL was 143, and would consider a PCSK9 inhibitor at his next visit. Pt states he checked with his pharmacy and this medication is too expensive.   I left a message for patient to call back to inquire about medication and had send a message to him in [place] on 12/20/17 to see if he needed any assistance with medication/cost but have not gotten a response.

## 2018-02-06 ENCOUNTER — Other Ambulatory Visit: Payer: Self-pay | Admitting: Internal Medicine

## 2018-02-06 MED ORDER — SILDENAFIL CITRATE 20 MG PO TABS: ORAL_TABLET | ORAL | 4 refills | Status: DC

## 2018-02-06 NOTE — Telephone Encounter (Signed)
Copied from [place]. Topic: Quick Communication - Rx Refill/Question >> Feb 06, 2018  9:11 AM Antonieta Iba C wrote: Medication:  Sildenafil (REVATIO) 20 MG tablet    Has the patient contacted their pharmacy?    (Agent: If no, request that the patient contact the pharmacy for the refill.)   Preferred Pharmacy (with phone number or street name): [place], [place]- [phone]   Agent: Please be advised that RX refills may take up to 3 business days. We ask that you follow-up with your pharmacy.

## 2018-02-07 ENCOUNTER — Other Ambulatory Visit: Payer: Self-pay | Admitting: Internal Medicine

## 2018-02-07 NOTE — Telephone Encounter (Signed)
Medication filled to pharmacy as requested.  Med was mistakenly filled yesterday to the wrong pharmacy. I called [facility] and canceled the rx there.

## 2018-02-25 ENCOUNTER — Telehealth: Payer: Self-pay | Admitting: *Deleted

## 2018-02-25 NOTE — Telephone Encounter (Signed)
Prior auth for Sildenafil 20mg  sent to [email or web address].

## 2018-03-17 ENCOUNTER — Telehealth: Payer: Self-pay | Admitting: Internal Medicine

## 2018-03-17 NOTE — Telephone Encounter (Signed)
New Message    *STAT* If patient is at the pharmacy, call can be transferred to refill team.   1. Which medications need to be refilled? (please list name of each medication and dose if known) apixaban (ELIQUIS) 5 MG TABS tablet  2. Which pharmacy/location (including street and city if local pharmacy) is medication to be sent to? [place]  3. Do they need a 30 day or 90 day supply? [place]

## 2018-03-18 MED ORDER — APIXABAN 5 MG PO TABS: 5.0000 mg | ORAL_TABLET | Freq: Two times a day (BID) | ORAL | 3 refills | Status: DC

## 2018-03-18 NOTE — Telephone Encounter (Signed)
Rx has been sent to the pharmacy electronically. ° °

## 2018-04-10 ENCOUNTER — Encounter: Payer: Self-pay | Admitting: Internal Medicine

## 2018-04-10 ENCOUNTER — Ambulatory Visit: Payer: Medicare Other | Admitting: Internal Medicine

## 2018-04-10 VITALS — BP 137/61 | HR 56 | Ht 72.0 in | Wt 190.4 lb

## 2018-04-10 DIAGNOSIS — Z955 Presence of coronary angioplasty implant and graft: Secondary | ICD-10-CM | POA: Diagnosis not present

## 2018-04-10 DIAGNOSIS — Z9889 Other specified postprocedural states: Secondary | ICD-10-CM | POA: Diagnosis not present

## 2018-04-10 DIAGNOSIS — I2510 Atherosclerotic heart disease of native coronary artery without angina pectoris: Secondary | ICD-10-CM | POA: Diagnosis not present

## 2018-04-10 DIAGNOSIS — E785 Hyperlipidemia, unspecified: Secondary | ICD-10-CM | POA: Diagnosis not present

## 2018-04-10 NOTE — Patient Instructions (Signed)
Your physician recommends that you return for lab work FASTING to check cholesterol   Your physician wants you to follow-up in: 6 months with Dr. Hilty. You will receive a reminder letter in the mail two months in advance. If you don't receive a letter, please call our office to schedule the follow-up appointment.  

## 2018-04-10 NOTE — Progress Notes (Signed)
OFFICE NOTE  Chief Complaint:  Follow-up dyslipidemia  Primary Care Physician: Marletta Lor, MD  HPI:  Sean Miranda is a pleasant 84 yo male, previously followed by Dr., Rex Kras, who has calcification of his coronary arteries documented by catheterization in 2009, but no occlusive disease. He had a nuclear study May 2012 that was a low risk and a 67% ejection fraction. He has known carotid disease, had Doppler studies done through [employer] that shows less than 80% on the right and less than 60% on the left, which is an increase from his Doppler study done a year previously, and he is currently supposedly scheduled for repeat study in 6 months. He has had no episodes of angina, no unusual shortness of breath, no awareness of any arrhythmias. He has had no ankle edema. He denies dizziness, lightheadedness, unilateral weakness, slurred speech or blurred vision. He checks his blood pressure regularly at home. It has been under good control. He has no unusual shortness of breath and has had no changes in exercise tolerance or stamina. He had had significant myalgias from Lipitor in the past, and I am a little concerned he may develop recurrent myalgias from the daily dose of Pravachol but I see no reason we cannot try this in view of his lipids which now show an LDL of 142 with an HDL of 51.  Unfortunately he was intolerant of Pravachol and other statins, is not currently taking them. He recently had repeat carotid Dopplers which showed high grade velocities in the right carotid and the upper end of the range of 79%. His carotids are followed that with our imaging and Dr. Leanne Chang is coordinating that.  Sean Miranda returns today and is doing fairly well. He does report some shortness of breath with exertion and some chest tightness which improves after exercise.  The chest tightness is fairly predictable with exercise and relieved by rest. This is concerning for possible angina.  He reports he discontinued his pravastatin about one year ago due to problems with leg cramps. He's also had side effects with Lipitor. Recent cholesterol profile showed total cholesterol 222, triglycerides 82, HDL 43 and LDL 162.  He's also had progression of his right internal carotid artery stenosis and underwent right carotid endarterectomy for greater than 80% stenosis of that artery. He does have moderate stenosis of the left internal carotid.  Sean Miranda was referred for a stress test after his last visit for exertional chest pain. Stress test was negative for ischemia. He returns today without complaints. He is due for a repeat doppler in 3-4 months.  04/16/2017  Sean Miranda returns today for follow-up. He is in persistent A. fib and failed recent cardioversion. Stress testing demonstrated no new ischemia. LVEF was normal on echo. We discussed options including antiarrhythmic therapy since he is symptomatic with his A. fib and he is agreeable to start on therapy. EKG today shows atrial fibrillation at 72 with a QTC of 440 ms. He is on metoprolol tartrate 12.5 mg twice a day.  06/12/2017  Sean Miranda returns for follow-up. He recently underwent a stress test which showed no ischemia and was started on flecainide. Unfortunately then developed acute chest pain and found to have non-STEMI. He underwent recatheterization on 05/16/2017, this demonstrated the following:    Mid RCA lesion, 50 %stenosed.  Ost RCA lesion, 25 %stenosed. Lesion noted in small RV marginal.  LM lesion, 25 %stenosed.  2nd Diag lesion, 40 %stenosed.  Mid LAD-1 lesion, 20 %  stenosed.  Mid LAD-2 lesion, 95 %stenosed.  A STENT SYNERGY DES 2.5X16 drug eluting stent was successfully placed, postdilated to 3.0 mm.  Post intervention, there is a 0% residual stenosis.  The left ventricular systolic function is normal.  LV end diastolic pressure is moderately elevated.  The left ventricular ejection fraction is 55-65%  by visual estimate.  There is no aortic valve stenosis.   LAD was the culprit for his unstable angina. He had symptoms during the procedure at rest. These resolved after stent placement. Watch overnight. Continue IV Angiomax for another hour.  Loaded with Plavix today. We'll start 75 mg daily tomorrow. Given aspirin today as well. Would plan for aspirin, Plavix and Eliquis for 30 days. After 30 days, would stop the aspirin and continue Plavix and Eliquis. A synergy drug-eluting stent was used since he is on long-term anticoagulation. If there is a bleeding complication, his antiplatelet therapy could be stopped sooner than usual. Ideally, he would get 6-12 months of Plavix depending on his bleeding risk profile.   Today he returns for follow-up. He seems to be doing well. He denies any further chest pain or worsening shortness of breath. He's had no recurrent A. fib and is back in sinus rhythm. We have discontinued flecainide due to his active coronary artery disease as it is conscious indicated. He remains on aspirin, Plavix and Eliquis. The plan was to stay on all 3 for one month and then switched to Plavix and Eliquis. Of note his recent lipid profile shows very poor control. His LDL-C was 143 with total cholesterol 194, triglycerides 63 and HDL 38. He reportedly has pravastatin 20 mg 3 times weekly. There is no evidence to support this dosing combination with reduction of cardiovascular risk. He is unwilling to take any other statins due to his concerns about side effects, memory loss, muscle pain, etc. He says he does "extensive reading on this".  12/10/2017  Sean Miranda returns today for follow-up.  He had a repeat lipid testing in September which showed total cholesterol 233, triglycerides 86, HDL 45 and LDL 171.  This is up from an LDL 143 9 months ago.  He was previously taking pravastatin daily now he takes only 10 mg every other day.  He is concerned about myalgias and about memory loss, but  has not really had either one.  He did have side effects with atorvastatin which she thought had caused myalgias.  Again we discussed PCS K9 inhibitors, for which I think is a good candidate.  04/10/2018  Sean Miranda was seen today in follow-up.  This was supposed to be follow-up of his dyslipidemia.  He continues to have significant concerns about having "too low of a cholesterol level".  He has been intolerant of atorvastatin and takes much less frequent doses of pravastatin.  I suggested a PCSK9 inhibitor and we had applied for and received prior authorization for Repatha, however he has not started the medication.  Again, he is concerns about too low but cholesterol number causing him symptoms.  This is despite me providing significant evidence to the contrary, including results from the Ebbinghaus study and others.  PMHx:  Past Medical History:  Diagnosis Date  . Arthritis    neck   . Atrial fibrillation ([place])    a. new-onset in 01/2017. Started on Eliquis  . CAD (coronary artery disease), native coronary artery 05/16/2017   LAD 95%>>0 w/ 2.5 x 16 mm Synergy DES, D2 40%, RCA 50%, EF 55-65%, LVEDP mod  elevated  . Calcification of coronary artery    a. mild, nonobstructive CAD by cath in 2009.  Marland Kitchen Cancer of skin of chest   . Carotid artery occlusion    a. s/p R CEA in 2014  . Colon polyps   . Hyperlipidemia    takes Pravastatin every other day  . Pneumonia ~ 01/2013    Past Surgical History:  Procedure Laterality Date  . CARDIAC CATHETERIZATION  06/09/1999   mild CAD (LAD, diagonal, RCA) - Dr. Loni Muse. [place]  . CARDIOVERSION N/A 03/25/2017   Procedure: CARDIOVERSION;  Surgeon: Pixie Casino, MD;  Location[facility] ENDOSCOPY;  Service: Cardiovascular;  Laterality: N/A;  . CAROTID DOPPLER  02/2012   60-79% RICA stenosis, 01-74% LICA stenosis (prior to carotid endarterectomy)  . COLONOSCOPY    . CORONARY STENT INTERVENTION N/A 05/16/2017   Procedure: Coronary Stent Intervention;  Surgeon:  Jettie Booze, MD;  Location: [place] [facility];  Service: Cardiovascular;  Laterality: N/A;  . ENDARTERECTOMY Right 11/10/2013   Procedure: ENDARTERECTOMY CAROTID;  Surgeon: Conrad Crofton, MD;  Location: [place];  Service: Vascular;  Laterality: Right;  . INGUINAL HERNIA REPAIR Left   . LEFT HEART CATH AND CORONARY ANGIOGRAPHY N/A 05/16/2017   Procedure: Left Heart Cath and Coronary Angiography;  Surgeon: Jettie Booze, MD;  Location: [place] [facility];  Service: Cardiovascular;  Laterality: N/A;  . MOLE REMOVAL  1990s   "chest"  . NM MYOCAR PERF WALL MOTION  04/2011   bruce myoview - perfusion defect in inferior myocardium (diaphragmatic attenuation), remaining myocardium with normal perfusion, EF 67%  . PATCH ANGIOPLASTY Right 11/10/2013   Procedure: PATCH ANGIOPLASTY;  Surgeon: Conrad Frederick, MD;  Location: [place];  Service: Vascular;  Laterality: Right;  . PILONIDAL CYST DRAINAGE    . SHOULDER OPEN ROTATOR CUFF REPAIR Bilateral 1998 - 2001  . SKIN CANCER EXCISION  ~ 2015   "chest"  . TONSILLECTOMY      FAMHx:  Family History  Problem Relation Age of Onset  . Stroke Father   . Hypertension Mother   . Prostate cancer Son   . Stroke Brother   . Cancer Brother        spine    SOCHx:   reports that he quit smoking about 45 years ago. His smoking use included cigarettes. He has a 18.00 pack-year smoking history. He has never used smokeless tobacco. He reports that he drinks about 8.4 oz of alcohol per week. He reports that he does not use drugs.  ALLERGIES:  Allergies  Allergen Reactions  . Lipitor [Atorvastatin Calcium] Other (See Comments)    Muscle pain  . Vantin [Cefpodoxime] Rash    ROS: Pertinent items noted in HPI and remainder of comprehensive ROS otherwise negative.  HOME MEDS: Current Outpatient Medications  Medication Sig Dispense Refill  . ALPHA LIPOIC ACID PO Take 300 mg by mouth daily.    Marland Kitchen apixaban (ELIQUIS) 5 MG TABS tablet Take 1 tablet (5 mg  total) by mouth 2 (two) times daily. 90 tablet 3  . Cholecalciferol (VITAMIN D3) 2000 UNITS capsule Take 2,000 Units by mouth daily.     . clopidogrel (PLAVIX) 75 MG tablet Take 75 mg by mouth daily.    . Coenzyme Q10-Levocarnitine 100-20 MG CAPS Take 1 capsule by mouth daily.     . Glucosamine-Chondroitin 500-400 MG CAPS Take 1 capsule by mouth daily.     . Magnesium 500 MG TABS Take 1 tablet by mouth daily.    Marland Kitchen  Multiple Vitamin (MULTIVITAMIN) tablet Take 0.5 tablets by mouth daily.     . nitroGLYCERIN (NITROSTAT) 0.4 MG SL tablet Place 1 tablet (0.4 mg total) under the tongue every 5 (five) minutes as needed for chest pain. 25 tablet 12  . OMEGA-3 1000 MG CAPS Take 4,000 mg by mouth daily.    . pravastatin (PRAVACHOL) 40 MG tablet Take 0.5 tablets (20 mg total) by mouth every Monday, Wednesday, and Friday. 30 tablet 11  . Probiotic Product (PROBIOTIC DAILY PO) Take 1 capsule by mouth daily.    . sildenafil (REVATIO) 20 MG tablet TAKE 2 TO 3 TABLETS BY MOUTH AS NEEDED FOR SEXUAL ACTIVITY 60 tablet 4   No current facility-administered medications for this visit.     LABS/IMAGING: No results found for this or any previous visit (from the past 48 hour(s)). No results found.  VITALS: BP 137/61   Pulse (!) 56   Ht 6' (1.829 m)   Wt 190 lb 6.4 oz (86.4 kg)   BMI 25.82 kg/m   EXAM: General appearance: alert and no distress Neck: no carotid bruit, no JVD and thyroid not enlarged, symmetric, no tenderness/mass/nodules Lungs: clear to auscultation bilaterally Heart: regular rate and rhythm Abdomen: soft, non-tender; bowel sounds normal; no masses,  no organomegaly Extremities: extremities normal, atraumatic, no cyanosis or edema Pulses: 2+ and symmetric Skin: Skin color, texture, turgor normal. No rashes or lesions Neurologic: Grossly normal Psych: Pleasant  EKG: Deferred  ASSESSMENT: 1. Coronary artery disease status post PCI to the mid-LAD (05/2017) 2. Symptomatic persistent  a-fib - negative nuclear stress test (04/2017), normal EF on echo 01/2017 3. CHADSVASC score of 4 - on Eliquis 4. History of calcified coronaries with no obstructive disease 5. Moderate left internal carotid artery stenosis, s/p right CEA 6. Dyslipidemia - history of statin intolerance on low dose pravastatin 7. Hypertension-controlled  PLAN: 1.   Sean Miranda is a good candidate for PCSK9 and has been approved, but is not willing to take it at this point because of concern for too low of cholesterol causing neurocognitive effects.  This is been sufficiently discounted through studies and removed from the package insert, but despite this he is unwilling to take that at this point.  We will plan a repeat lipid profile today.  If his numbers remain above goal, I will again speak with him about starting on Repatha.    Otherwise follow-up in 6 months.  Pixie Casino, MD, [facility] [place] [facility], [place] Director of the [facility] Lipid [facility] &  Card[facility]ovascular [facility] Diplomate of the American Board of Clinical Lipidology Attending Cardiologist  Direct Dial: [phone]  Fax: [phone]  Website:  [email or web address] 04/10/2018, 11:14 AM

## 2018-04-14 ENCOUNTER — Other Ambulatory Visit (HOSPITAL_COMMUNITY)
Admission: RE | Admit: 2018-04-14 | Discharge: 2018-04-14 | Disposition: A | Discharge status: Home / Self Care | Payer: Medicare Other | Source: Ambulatory Visit | Attending: Internal Medicine | Admitting: Internal Medicine

## 2018-04-14 DIAGNOSIS — E785 Hyperlipidemia, unspecified: Secondary | ICD-10-CM | POA: Insufficient documentation

## 2018-04-21 DIAGNOSIS — D485 Neoplasm of uncertain behavior of skin: Secondary | ICD-10-CM | POA: Diagnosis not present

## 2018-04-21 DIAGNOSIS — L259 Unspecified contact dermatitis, unspecified cause: Secondary | ICD-10-CM | POA: Diagnosis not present

## 2018-04-30 ENCOUNTER — Encounter: Payer: Self-pay | Admitting: Internal Medicine

## 2018-04-30 ENCOUNTER — Ambulatory Visit (INDEPENDENT_AMBULATORY_CARE_PROVIDER_SITE_OTHER): Payer: Medicare Other | Admitting: Internal Medicine

## 2018-04-30 VITALS — BP 128/60 | HR 55 | Temp 97.6°F | Ht 71.0 in | Wt 189.0 lb

## 2018-04-30 DIAGNOSIS — I480 Paroxysmal atrial fibrillation: Secondary | ICD-10-CM | POA: Diagnosis not present

## 2018-04-30 DIAGNOSIS — I6523 Occlusion and stenosis of bilateral carotid arteries: Secondary | ICD-10-CM | POA: Diagnosis not present

## 2018-04-30 DIAGNOSIS — I2510 Atherosclerotic heart disease of native coronary artery without angina pectoris: Secondary | ICD-10-CM | POA: Diagnosis not present

## 2018-04-30 DIAGNOSIS — Z0000 Encounter for general adult medical examination without abnormal findings: Secondary | ICD-10-CM | POA: Diagnosis not present

## 2018-04-30 DIAGNOSIS — I10 Essential (primary) hypertension: Secondary | ICD-10-CM

## 2018-04-30 DIAGNOSIS — E7800 Pure hypercholesterolemia, unspecified: Secondary | ICD-10-CM

## 2018-04-30 NOTE — Progress Notes (Signed)
Subjective:    Patient ID: Sean Miranda, male    DOB: October 12, 1930, 84 y.o.   MRN: [id number]  HPI  84 year old patient who is seen today for a annual exam and subsequent Medicare wellness visit He is followed closely by cardiology with history of coronary artery disease.  Last year he is hospitalized twice.  Initially for new onset atrial fibrillation.  He remains on Eliquis. He later presented with unstable angina and is status post PCI with stenting.  In addition he remains on Plavix He has history of dyslipidemia and has been intolerant of Lipitor and pravastatin in the past presently he has been prescribed 10 mg of pravastatin which he takes very infrequently.  He states he has not taken this in 1 or 2 weeks He has a history of carotid artery stenosis and is status post right CEA. He states he has carotid Doppler studies performed every January.  Denies any focal neurological complaints.  Past Medical History:  Diagnosis Date  . Arthritis    neck   . Atrial fibrillation ([place])    a. new-onset in 01/2017. Started on Eliquis  . CAD (coronary artery disease), native coronary artery 05/16/2017   LAD 95%>>0 w/ 2.5 x 16 mm Synergy DES, D2 40%, RCA 50%, EF 55-65%, LVEDP mod elevated  . Calcification of coronary artery    a. mild, nonobstructive CAD by cath in 2009.  Marland Kitchen Cancer of skin of chest   . Carotid artery occlusion    a. s/p R CEA in 2014  . Colon polyps   . Hyperlipidemia    takes Pravastatin every other day  . Pneumonia ~ 01/2013     Social History   Socioeconomic History  . Marital status: Married    Spouse name: Not on file  . Number of children: 3  . Years of education: Not on file  . Highest education level: Not on file  Occupational History    Employer: [employer]  . Financial resource strain: Not on file  . Food insecurity:    Worry: Not on file    Inability: Not on file  . Transportation needs:    Medical: Not on file    Non-medical: Not  on file  Tobacco Use  . Smoking status: Former Smoker    Packs/day: 1.00    Years: 18.00    Pack years: 18.00    Types: Cigarettes    Last attempt to quit: 12/03/1972    Years since quitting: 45.4  . Smokeless tobacco: Never Used  Substance and Sexual Activity  . Alcohol use: Yes    Alcohol/week: 8.4 oz    Types: 7 Glasses of wine, 7 Shots of liquor per week  . Drug use: No  . Sexual activity: Yes  Lifestyle  . Physical activity:    Days per week: Not on file    Minutes per session: Not on file  . Stress: Not on file  Relationships  . Social connections:    Talks on phone: Not on file    Gets together: Not on file    Attends religious service: Not on file    Active member of club or organization: Not on file    Attends meetings of clubs or organizations: Not on file    Relationship status: Not on file  . Intimate partner violence:    Fear of current or ex partner: Not on file    Emotionally abused: Not on file  Physically abused: Not on file    Forced sexual activity: Not on file  Other Topics Concern  . Not on file  Social History Narrative  . Not on file    Past Surgical History:  Procedure Laterality Date  . CARDIAC CATHETERIZATION  06/09/1999   mild CAD (LAD, diagonal, RCA) - Dr. Loni Muse. [place]  . CARDIOVERSION N/A 03/25/2017   Procedure: CARDIOVERSION;  Surgeon: Pixie Casino, MD;  Location: [facility] ENDOS[facility];  Service: Cardiovascular;  Laterality: N/A;  . CAROTID DOPPLER  02/2012   60-79% RICA stenosis, 92-33% LICA stenosis (prior to carotid endarterectomy)  . COLONOSCOPY    . CORONARY STENT INTERVENTION N/A 05/16/2017   Procedure: Coronary Stent Intervention;  Surgeon: Jettie Booze, MD;  Location: [place] [facility];  Service: Cardiovascular;  Laterality: N/A;  . ENDARTERECTOMY Right 11/10/2013   Procedure: ENDARTERECTOMY CAROTID;  Surgeon: Conrad Cedarville, MD;  Location: [facility];  Service: Vascular;  Laterality: Right;  . INGUINAL HERNIA REPAIR Left   . LEFT HEART  CATH AND CORONARY ANGIOGRAPHY N/A 05/16/2017   Procedure: Left Heart Cath and Coronary Angiography;  Surgeon: Jettie Booze, MD;  Location: [place] [facility];  Service: Cardiovascular;  Laterality: N/A;  . MOLE REMOVAL  1990s   "chest"  . NM MYOCAR PERF WALL MOTION  04/2011   bruce myoview - perfusion defect in inferior myocardium (diaphragmatic attenuation), remaining myocardium with normal perfusion, EF 67%  . PATCH ANGIOPLASTY Right 11/10/2013   Procedure: PATCH ANGIOPLASTY;  Surgeon: Conrad Porter, MD;  Location: [facility];  Service: Vascular;  Laterality: Right;  . PILONIDAL CYST DRAINAGE    . SHOULDER OPEN ROTATOR CUFF REPAIR Bilateral 1998 - 2001  . SKIN CANCER EXCISION  ~ 2015   "chest"  . TONSILLECTOMY      Family History  Problem Relation Age of Onset  . Stroke Father   . Hypertension Mother   . Prostate cancer Son   . Stroke Brother   . Cancer Brother        spine    Allergies  Allergen Reactions  . Lipitor [Atorvastatin Calcium] Other (See Comments)    Muscle pain  . Vantin [Cefpodoxime] Rash    Current Outpatient Medications on File Prior to Visit  Medication Sig Dispense Refill  . ALPHA LIPOIC ACID PO Take 300 mg by mouth daily.    Marland Kitchen apixaban (ELIQUIS) 5 MG TABS tablet Take 1 tablet (5 mg total) by mouth 2 (two) times daily. 90 tablet 3  . Cholecalciferol (VITAMIN D3) 2000 UNITS capsule Take 2,000 Units by mouth daily.     . clopidogrel (PLAVIX) 75 MG tablet Take 75 mg by mouth daily.    . Coenzyme Q10-Levocarnitine 100-20 MG CAPS Take 1 capsule by mouth daily.     . Glucosamine-Chondroitin 500-400 MG CAPS Take 1 capsule by mouth daily.     . Magnesium 500 MG TABS Take 1 tablet by mouth daily.    . Multiple Vitamin (MULTIVITAMIN) tablet Take 0.5 tablets by mouth daily.     . nitroGLYCERIN (NITROSTAT) 0.4 MG SL tablet Place 1 tablet (0.4 mg total) under the tongue every 5 (five) minutes as needed for chest pain. 25 tablet 12  . OMEGA-3 1000 MG CAPS Take 4,000  mg by mouth daily.    . pravastatin (PRAVACHOL) 40 MG tablet Take 0.5 tablets (20 mg total) by mouth every Monday, Wednesday, and Friday. 30 tablet 11  . Probiotic Product (PROBIOTIC DAILY PO) Take 1 capsule by mouth  daily.    . sildenafil (REVATIO) 20 MG tablet TAKE 2 TO 3 TABLETS BY MOUTH AS NEEDED FOR SEXUAL ACTIVITY 60 tablet 4   No current facility-administered medications on file prior to visit.     BP 128/60 (BP Location: Right Arm, Patient Position: Sitting, Cuff Size: Large)   Pulse (!) 55   Temp 97.6 F (36.4 C) (Oral)   Ht 5\' 11"  (1.803 m)   Wt 189 lb (85.7 kg)   SpO2 97%   BMI 26.36 kg/m    Subsequent Medicare wellness visit  1. Risk factors, based on past  M,S,F history.  Patient has known coronary artery disease as well as peripheral arterial disease.  He is status post right CEA and stenting for coronary artery disease.  He has a history of dyslipidemia.  2.  Physical activities: Remains remarkably active at 47.  Still works part-time.  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  Still active with golf each Friday  3.  Depression/mood: No history major depression or mood disorder  4.  Hearing: Wears hearing aids bilaterally.  Sees ENT every 3 months for battery changes  5.  ADL's: Independent in all aspects of daily living  6.  Fall risk: Low  7.  Home safety: No problems identified  8.  Height weight, and visual acuity; height and weight stable no change in visual acuity  9.  Counseling: Heart healthy diet recommended compliance with statin therapy encouraged  10. Lab orders based on risk factors: Laboratory update will be reviewed  11. Referral : Follow-up cardiology  12. Care plan: Continue efforts at aggressive risk factor modification  13. Cognitive assessment: Alert and appropriate normal affect.  No cognitive dysfunction  14. Screening: Patient provided with a written and personalized 5-10 year screening schedule in the AVS.     15. Provider List Update: ENT cardiology dermatology and oncology as well as primary care      Review of Systems  Constitutional: Negative for appetite change, chills, fatigue and fever.  HENT: Negative for congestion, dental problem, ear pain, hearing loss, sore throat, tinnitus, trouble swallowing and voice change.   Eyes: Negative for pain, discharge and visual disturbance.  Respiratory: Negative for cough, chest tightness, wheezing and stridor.   Cardiovascular: Negative for chest pain, palpitations and leg swelling.  Gastrointestinal: Negative for abdominal distention, abdominal pain, blood in stool, constipation, diarrhea, nausea and vomiting.  Genitourinary: Negative for difficulty urinating, discharge, flank pain, genital sores, hematuria and urgency.  Musculoskeletal: Negative for arthralgias, back pain, gait problem, joint swelling, myalgias and neck stiffness.  Skin: Negative for rash.  Neurological: Negative for dizziness, syncope, speech difficulty, weakness, numbness and headaches.  Hematological: Negative for adenopathy. Does not bruise/bleed easily.  Psychiatric/Behavioral: Negative for behavioral problems and dysphoric mood. The patient is not nervous/anxious.        Objective:   Physical Exam  Constitutional: He appears well-developed and well-nourished.  Blood pressure 134/70  HENT:  Head: Normocephalic and atraumatic.  Right Ear: External ear normal.  Left Ear: External ear normal.  Nose: Nose normal.  Mouth/Throat: Oropharynx is clear and moist.  Bilateral hearing aids and canals  Eyes: Pupils are equal, round, and reactive to light. Conjunctivae and EOM are normal. No scleral icterus.  Neck: Normal range of motion. Neck supple. No JVD present. No thyromegaly present.  Status post right CEA  Cardiovascular: Normal rate, regular rhythm and intact distal pulses. Exam reveals no gallop and no friction rub.  Murmur heard. Faint left subclavian  bruit Dorsalis pedis pulses full Posterior tibial pulses +1  Grade 2/6 systolic murmur  Pulmonary/Chest: Effort normal and breath sounds normal. He exhibits no tenderness.  Abdominal: Soft. Bowel sounds are normal. He exhibits no distension and no mass. There is no tenderness.  Genitourinary: Prostate normal and penis normal. Rectal exam shows guaiac negative stool.  Genitourinary Comments: Uncircumcised Prostate remarkably small  Musculoskeletal: Normal range of motion. He exhibits no edema or tenderness.  Lymphadenopathy:    He has no cervical adenopathy.  Neurological: He is alert. He has normal reflexes. No cranial nerve deficit. Coordination normal.  Skin: Skin is warm and dry. No rash noted.  Psychiatric: He has a normal mood and affect. His behavior is normal.          Assessment & Plan:   Preventive health examination Subsequent Medicare wellness visit Coronary artery disease.  Continue Plavix.  Compliance with statin therapy encouraged.  Follow-up cardiology Carotid artery disease status post right CEA Dyslipidemia  Will review updated lab Follow-up multiple consultants Return here in 6 to 12 months or as needed   [employer]

## 2018-04-30 NOTE — Patient Instructions (Signed)
Limit your sodium (Salt) intake    It is important that you exercise regularly, at least 20 minutes 3 to 4 times per week.  If you develop chest pain or shortness of breath seek  medical attention.  Cardiology follow-up as scheduled  Return in 6 to 12 months for follow-up

## 2018-05-12 ENCOUNTER — Ambulatory Visit (INDEPENDENT_AMBULATORY_CARE_PROVIDER_SITE_OTHER): Payer: Medicare Other | Admitting: Family Medicine

## 2018-05-12 ENCOUNTER — Other Ambulatory Visit: Payer: Medicare Other

## 2018-05-12 ENCOUNTER — Encounter: Payer: Self-pay | Admitting: Family Medicine

## 2018-05-12 VITALS — BP 128/70 | HR 56 | Temp 97.6°F | Ht 71.0 in | Wt 188.0 lb

## 2018-05-12 DIAGNOSIS — Z131 Encounter for screening for diabetes mellitus: Secondary | ICD-10-CM

## 2018-05-12 DIAGNOSIS — I6523 Occlusion and stenosis of bilateral carotid arteries: Secondary | ICD-10-CM | POA: Diagnosis not present

## 2018-05-12 DIAGNOSIS — I10 Essential (primary) hypertension: Secondary | ICD-10-CM

## 2018-05-12 DIAGNOSIS — I480 Paroxysmal atrial fibrillation: Secondary | ICD-10-CM | POA: Diagnosis not present

## 2018-05-12 DIAGNOSIS — Z0000 Encounter for general adult medical examination without abnormal findings: Secondary | ICD-10-CM | POA: Diagnosis not present

## 2018-05-12 DIAGNOSIS — E7800 Pure hypercholesterolemia, unspecified: Secondary | ICD-10-CM | POA: Diagnosis not present

## 2018-05-12 LAB — COMPREHENSIVE METABOLIC PANEL
ALBUMIN: 4.3 g/dL (ref 3.5–5.2)
ALK PHOS: 60 U/L (ref 39–117)
ALT: 11 U/L (ref 0–53)
AST: 16 U/L (ref 0–37)
BUN: 17 mg/dL (ref 6–23)
CALCIUM: 9.6 mg/dL (ref 8.4–10.5)
CO2: 28 mEq/L (ref 19–32)
Chloride: 102 mEq/L (ref 96–112)
Creatinine, Ser: 1 mg/dL (ref 0.40–1.50)
GFR: 75.24 mL/min (ref >60.00)
Glucose, Bld: 98 mg/dL (ref 70–99)
POTASSIUM: 4.8 meq/L (ref 3.5–5.1)
SODIUM: 137 meq/L (ref 135–145)
TOTAL PROTEIN: 7 g/dL (ref 6.0–8.3)
Total Bilirubin: 0.7 mg/dL (ref 0.2–1.2)

## 2018-05-12 LAB — CBC WITH DIFFERENTIAL/PLATELET
Basophils Absolute: 0 10*3/uL (ref 0.0–0.1)
Basophils Relative: 0.3 % (ref 0.0–3.0)
EOS PCT: 3.2 % (ref 0.0–5.0)
Eosinophils Absolute: 0.2 10*3/uL (ref 0.0–0.7)
HCT: 43 % (ref 39.0–52.0)
HEMOGLOBIN: 15 g/dL (ref 13.0–17.0)
Lymphocytes Relative: 37.2 % (ref 12.0–46.0)
Lymphs Abs: 1.8 10*3/uL (ref 0.7–4.0)
MCHC: 34.8 g/dL (ref 30.0–36.0)
MCV: 92.6 fl (ref 78.0–100.0)
MONO ABS: 0.5 10*3/uL (ref 0.1–1.0)
Monocytes Relative: 9.9 % (ref 3.0–12.0)
Neutro Abs: 2.4 10*3/uL (ref 1.4–7.7)
Neutrophils Relative %: 49.4 % (ref 43.0–77.0)
Platelets: 224 10*3/uL (ref 150.0–400.0)
RBC: 4.64 Mil/uL (ref 4.22–5.81)
RDW: 13.2 % (ref 11.5–15.5)
WBC: 5 10*3/uL (ref 4.0–10.5)

## 2018-05-12 LAB — LIPID PANEL
CHOLESTEROL: 195 mg/dL (ref 0–200)
HDL: 39.6 mg/dL (ref >39.00)
LDL Cholesterol: 141 mg/dL — ABNORMAL HIGH (ref 0–99)
NonHDL: 155.33
TRIGLYCERIDES: 73 mg/dL (ref 0.0–149.0)
Total CHOL/HDL Ratio: 5
VLDL: 14.6 mg/dL (ref 0.0–40.0)

## 2018-05-12 LAB — TSH: TSH: 1.57 u[IU]/mL (ref 0.35–4.50)

## 2018-05-12 LAB — HEMOGLOBIN A1C: HEMOGLOBIN A1C: 5.5 % (ref 4.6–6.5)

## 2018-05-12 NOTE — Progress Notes (Signed)
Subjective:    Patient ID: Sean Miranda, male    DOB: 12-31-1929, 84 y.o.   MRN: [id number]  No chief complaint on file.   HPI Patient was seen today for follow-up on chronic conditions.  Overall he is doing well.  He takes his pravastatin every other day.  Pt denies myalgias, weakness.  In the past did not tolerate lipitor.  Pt checking blood pressure daily typically 95/54 or 120s over 50s to 60s.  Pt is exercising 3 times per week and drinking about a gallon of water per day.  Pt had recent PCI with stent placement, he followed up with cardiology a few weeks ago.  Pt taking Eliquis and Plavix daily.  Pt also taking various vitamins. Past Medical History:  Diagnosis Date  . Arthritis    neck   . Atrial fibrillation ([place])    a. new-onset in 01/2017. Started on Eliquis  . CAD (coronary artery disease), native coronary artery 05/16/2017   LAD 95%>>0 w/ 2.5 x 16 mm Synergy DES, D2 40%, RCA 50%, EF 55-65%, LVEDP mod elevated  . Calcification of coronary artery    a. mild, nonobstructive CAD by cath in 2009.  Marland Kitchen Cancer of skin of chest   . Carotid artery occlusion    a. s/p R CEA in 2014  . Colon polyps   . Hyperlipidemia    takes Pravastatin every other day  . Pneumonia ~ 01/2013    Allergies  Allergen Reactions  . Lipitor [Atorvastatin Calcium] Other (See Comments)    Muscle pain  . Vantin [Cefpodoxime] Rash    ROS General: Denies fever, chills, night sweats, changes in weight, changes in appetite HEENT: Denies headaches, ear pain, changes in vision, rhinorrhea, sore throat CV: Denies CP, palpitations, SOB, orthopnea Pulm: Denies SOB, cough, wheezing GI: Denies abdominal pain, nausea, vomiting, diarrhea, constipation GU: Denies dysuria, hematuria, frequency, vaginal discharge Msk: Denies muscle cramps, joint pains Neuro: Denies weakness, numbness, tingling Skin: Denies rashes, bruising Psych: Denies depression, anxiety, hallucinations     Objective:    Blood pressure  128/70, pulse (!) 56, temperature 97.6 F (36.4 C), temperature source Oral, height 5\' 11"  (1.803 m), weight 188 lb (85.3 kg), SpO2 97 %.   Gen. Pleasant, well-nourished, in no distress, normal affect   HEENT: [place]/AT, face symmetric, no scleral icterus, PERRLA, nares patent without drainage, pharynx without erythema or exudate.  Hearing aids in place b/l. Neck: No JVD, no thyromegaly, no carotid bruits Lungs: no accessory muscle use, CTAB, no wheezes or rales Cardiovascular: RRR, no m/r/g, no peripheral edema Abdomen: BS present, soft, NT/ND Neuro:  A&Ox3, CN II-XII intact, normal gait   Wt Readings from Last 3 Encounters:  05/12/18 188 lb (85.3 kg)  04/30/18 189 lb (85.7 kg)  04/10/18 190 lb 6.4 oz (86.4 kg)    Lab Results  Component Value Date   WBC 4.7 06/29/2017   HGB 14.3 06/29/2017   HCT 40.3 06/29/2017   PLT 186 06/29/2017   GLUCOSE 135 (H) 06/29/2017   CHOL 233 (H) 08/12/2017   TRIG 86 08/12/2017   HDL 45 08/12/2017   LDLDIRECT 149.3 01/05/2014   LDLCALC 171 (H) 08/12/2017   ALT 16 (L) 06/29/2017   AST 24 06/29/2017   NA 131 (L) 06/29/2017   K 3.9 06/29/2017   CL 100 (L) 06/29/2017   CREATININE 0.91 06/29/2017   BUN 18 06/29/2017   CO2 21 (L) 06/29/2017   TSH 1.889 02/21/2017   PSA 0.33 10/12/2015   INR  0.99 11/09/2013    Assessment/Plan:  Essential hypertension -Controlled -Continue lifestyle modifications  Screening for diabetes mellitus  - Plan: Hemoglobin A1c  Paroxysmal atrial fibrillation (HCC) -Continue Eliquis and Plavix -Continue pravastatin 10 mg -Continue follow-up with cardiology  F/u in 6 to 12 months, sooner if needed.  Grier Mitts, MD

## 2018-05-12 NOTE — Patient Instructions (Signed)
Heart Disease Prevention Heart disease is a leading cause of death. There are many things you can do to help prevent heart disease. Be physically active Physical activity is good for your heart. It helps control your blood pressure, cholesterol levels, and weight. Try to be physically active every day. Ask your health care provider what activities are best for you. Be a healthy weight Extra weight can strain your heart and affect your blood pressure and cholesterol levels. Lose weight with diet and exercise if recommended by your health care provider. Eat heart-healthy foods Follow a healthy eating plan as recommended by your health care provider or dietitian. Heart-healthy foods include:  High-fiber foods. These include oat bran, oatmeal, and whole-grain breads and cereals.  Fruits and vegetables.  Avoid:  Alcohol.  Fried foods.  Foods high in saturated fat. These include meats, butter, whole dairy products, shortening, and coconut or palm oil.  Salty foods. These include canned food, luncheon meat, salty snacks, and fast food.  Keep your cholesterol levels under control Cholesterol is a substance that is used for many important functions. When your cholesterol levels are high, cholesterol can stick to the insides of your blood vessels, making them narrow or clog. This can lead to chest pain (angina) and a heart attack. Keep your cholesterol levels under control as recommended by your health care provider. Have your cholesterol checked at least once a year. Target cholesterol levels (in mg/dL) for most people are:  Total cholesterol below 200.  LDL cholesterol below 100.  HDL cholesterol above 40 in men and above 50 in women.  Triglycerides below 150.  Keep your blood pressure under control Having high blood pressure (hypertension) puts you at risk for stroke and other forms of heart disease. Keep your blood pressure under control as recommended by your health care provider. Ask  your health care provider if you need treatment to lower your blood pressure. If you are 18-39 years of age, have your blood pressure checked every 3-5 years. If you are 40 years of age or older, have your blood pressure checked every year. Do not use tobacco products Tobacco smoke can damage your heart and blood vessels. Do not use any tobacco products including cigarettes, chewing tobacco, or electronic cigarettes. If you need help quitting, ask your health care provider. Take medicines as directed Take medicines only as directed by your health care provider. Ask your health care provider whether you should take an aspirin every day. Taking aspirin can help reduce your risk of heart disease and stroke. Where to find more information: To find out more about heart disease, visit the American Heart Association's website at www.americanheart.org This information is not intended to replace advice given to you by your health care provider. Make sure you discuss any questions you have with your health care provider. Document Released: 07/03/2004 Document Revised: 04/18/2016 Document Reviewed: 01/13/2014 Elsevier Interactive Patient Education  2018 Elsevier Inc.  

## 2018-05-19 ENCOUNTER — Telehealth: Payer: Self-pay | Admitting: Internal Medicine

## 2018-05-19 NOTE — Telephone Encounter (Signed)
Pt was advised of labs ordered by this provider last wk.

## 2018-05-19 NOTE — Telephone Encounter (Signed)
Pt in lobby requesting lab results from 05/12/18 Pt aware that someone will call with results A1c results printed and given to pt.  All other labs still need to be resulted.   Will send to Dr Raliegh Ip who ordered the lab as well as PCP Dr Volanda Napoleon to result all other labs.

## 2018-05-24 ENCOUNTER — Other Ambulatory Visit: Payer: Self-pay | Admitting: Physician Assistant

## 2018-05-26 NOTE — Telephone Encounter (Signed)
Rx request sent to pharmacy.  

## 2018-05-30 ENCOUNTER — Telehealth: Payer: Self-pay | Admitting: Internal Medicine

## 2018-05-30 NOTE — Telephone Encounter (Signed)
He is due for annual follow-up . Would like to see and assess him and then can discuss stopping plavix.  Dr. Lemmie Evens

## 2018-05-30 NOTE — Telephone Encounter (Signed)
New Message:    Please call,pt wants to talk about his continuing to take two blood thinner.

## 2018-05-30 NOTE — Telephone Encounter (Signed)
Returned call to patient who states he is 1 year post-stent and would like to know if he can stop plavix. Explained that eliquis is for his AF and would not be discontinued. Will route to MD

## 2018-05-31 NOTE — Telephone Encounter (Signed)
No .. That's ok. I guess we didn't discuss the plavix in May. If he is chest pain free, we can stop plavix since he is 1 year post stent - will need to remain on aspirin 81 mg daily.  Dr. Lemmie Evens

## 2018-06-02 NOTE — Telephone Encounter (Signed)
Patient called w/MD recommendations. He is NOT having chest pain. He will stop plavix and take ASA 81mg 

## 2018-06-28 ENCOUNTER — Other Ambulatory Visit: Payer: Self-pay | Admitting: Internal Medicine

## 2018-07-16 DIAGNOSIS — S60221A Contusion of right hand, initial encounter: Secondary | ICD-10-CM | POA: Diagnosis not present

## 2018-11-12 IMAGING — DX DG CHEST 2V
2 series · 2 of 2 positions shown · non-contrast
Comparison: 02/21/2017

CLINICAL DATA: Left-sided chest pain

EXAM:
CHEST  2 VIEW

[chest lat]
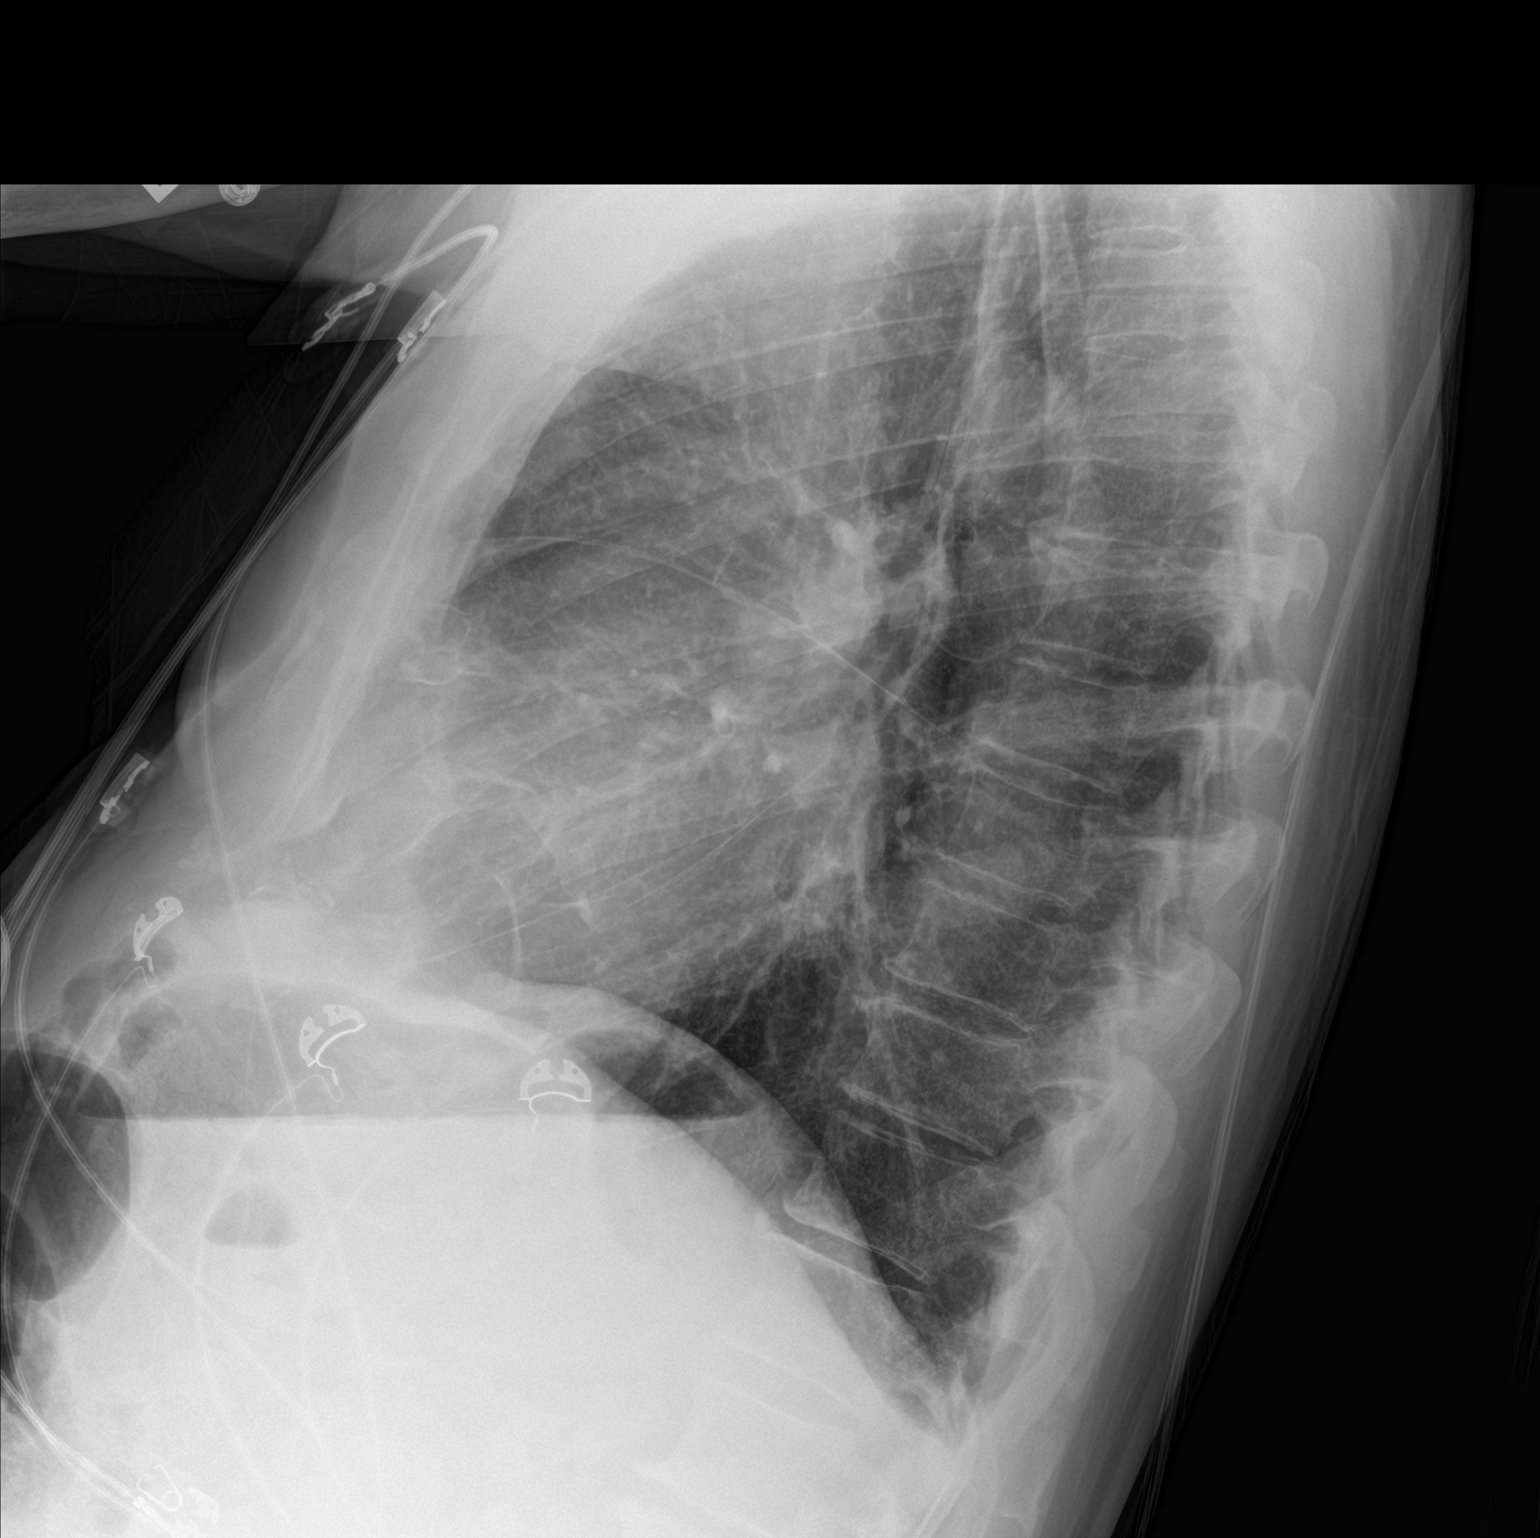

[chest ap]
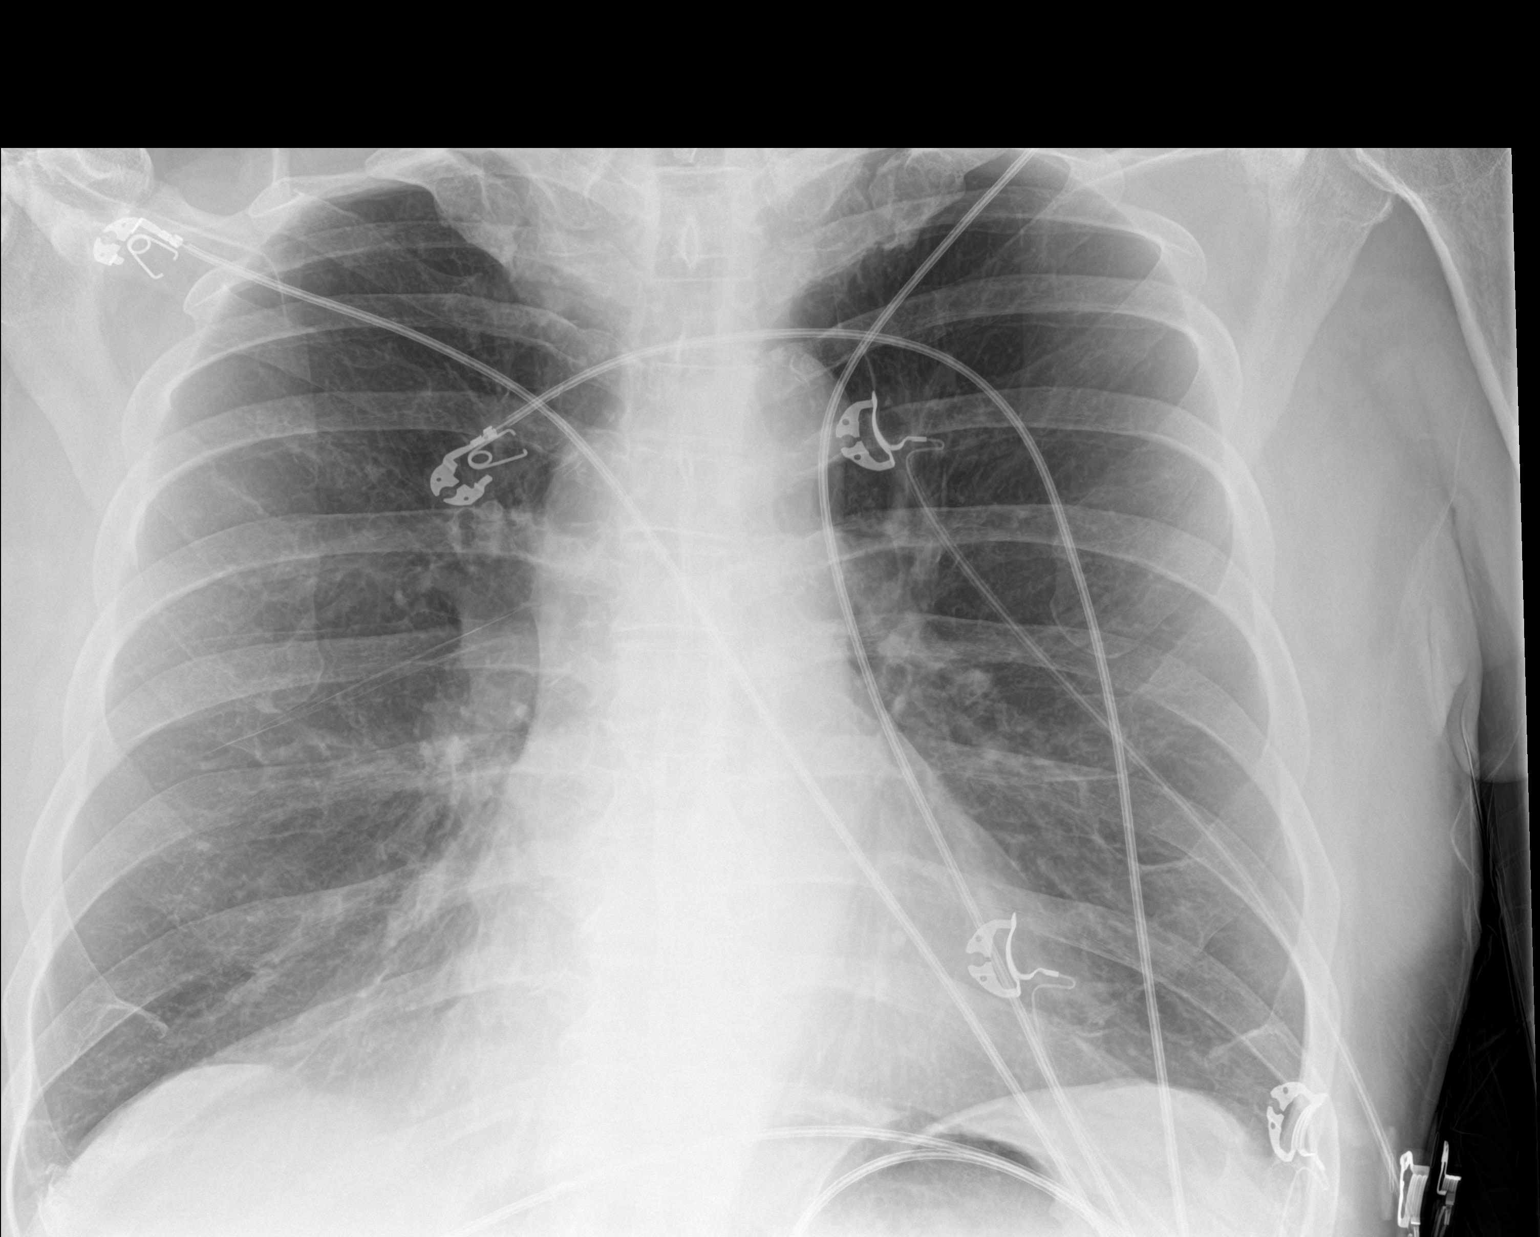

[2 of 2 positions shown; findings below may reference images not displayed]

FINDINGS: Linear scarring or atelectasis at both lung bases. No focal
consolidation or pleural effusion. Cardiomediastinal silhouette
within normal limits with atherosclerosis. No pneumothorax.
Postsurgical changes of the left humeral head
IMPRESSION: Linear scarring or atelectasis at the lung bases. No acute
infiltrate or edema.

## 2018-12-08 DIAGNOSIS — H40013 Open angle with borderline findings, low risk, bilateral: Secondary | ICD-10-CM | POA: Diagnosis not present

## 2018-12-08 DIAGNOSIS — H25813 Combined forms of age-related cataract, bilateral: Secondary | ICD-10-CM | POA: Diagnosis not present

## 2018-12-12 ENCOUNTER — Encounter: Payer: Self-pay | Admitting: Cardiology

## 2018-12-12 ENCOUNTER — Ambulatory Visit: Payer: Medicare Other | Admitting: Cardiology

## 2018-12-12 VITALS — BP 142/60 | HR 62 | Ht 72.0 in | Wt 192.0 lb

## 2018-12-12 DIAGNOSIS — E782 Mixed hyperlipidemia: Secondary | ICD-10-CM

## 2018-12-12 DIAGNOSIS — I2510 Atherosclerotic heart disease of native coronary artery without angina pectoris: Secondary | ICD-10-CM | POA: Diagnosis not present

## 2018-12-12 DIAGNOSIS — I4891 Unspecified atrial fibrillation: Secondary | ICD-10-CM

## 2018-12-12 MED ORDER — EZETIMIBE 10 MG PO TABS: 10.0000 mg | ORAL_TABLET | Freq: Every day | ORAL | 3 refills | Status: DC

## 2018-12-12 NOTE — Patient Instructions (Signed)
Medication Instructions:  RESTART 81 MG ASPIRIN DAILY   START ZETIA 10 MG DAILY   Labwork: NONE  Testing/Procedures: NONE  Follow-Up: Your physician wants you to follow-up in: 6 MONTHS  You will receive a reminder letter in the mail two months in advance. If you don't receive a letter, please call our office to schedule the follow-up appointment.    Any Other Special Instructions Will Be Listed Below (If Applicable).     If you need a refill on your cardiac medications before your next appointment, please call your pharmacy.

## 2018-12-12 NOTE — Progress Notes (Signed)
Clinical Summary Mr. Farace is a 85 y.o.male last seen by Dr Debara Pickett, this is our first visit together. Patient is establishing in [place] office which is closer to home.   1. CAD - admit 05/2017 with chest pain - 05/2017 cath as reported below, received a DES to LAD - 05/2017 echo LVEF 60-65%, no WMAs  - works out 3 times a week. Does treadmill x 20 min at regular. Very mild SOB at times.  - compliant with meds.   2. Afib - was on flecanide previously, discontued after diagnosed with CAD  - no recent palpitations - no bleeding on eliquis   3. Carotid stenosis - history of right CEA - 01/2018 carotid US RICA 1-39% and patent CEA, left 1-39%. Annual checks  - followed by vascular.   4. Hyperlipidemia - intolerant to statin, has been on low dose pravastatin.  - Dr Debara Pickett had discussed repatha, however patient declinded due to concern about too low cholesterol - 05/2018 TC 195 HDL 39 TG 73 LDL 141  - taking pravastatin 40mg  MWF - has been resistant to repatha.     Home bp's around 110s/50s.     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     Past Medical History:  Diagnosis Date  . Arthritis    neck   . Atrial fibrillation ([place])    a. new-onset in 01/2017. Started on Eliquis  . CAD (coronary artery disease), native coronary artery 05/16/2017   LAD 95%>>0 w/ 2.5 x 16 mm Synergy DES, D2 40%, RCA 50%, EF 55-65%, LVEDP mod elevated  . Calcification of coronary artery    a. mild, nonobstructive CAD by cath in 2009.  Marland Kitchen Cancer of skin of chest   . Carotid artery occlusion    a. s/p R CEA in 2014  . Colon polyps   . Hyperlipidemia    takes Pravastatin every other day  . Pneumonia ~ 01/2013     Allergies  Allergen Reactions  . Lipitor [Atorvastatin Calcium] Other (See Comments)    Muscle pain  . Vantin [Cefpodoxime] Rash     Current Outpatient Medications  Medication Sig Dispense  Refill  . ALPHA LIPOIC ACID PO Take 300 mg by mouth daily.    Marland Kitchen apixaban (ELIQUIS) 5 MG TABS tablet Take 1 tablet (5 mg total) by mouth 2 (two) times daily. 90 tablet 3  . aspirin EC 81 MG tablet Take 81 mg by mouth daily.    . Cholecalciferol (VITAMIN D3) 2000 UNITS capsule Take 2,000 Units by mouth daily.     . Coenzyme Q10-Levocarnitine 100-20 MG CAPS Take 1 capsule by mouth daily.     . Glucosamine-Chondroitin 500-400 MG CAPS Take 1 capsule by mouth daily.     . Magnesium 500 MG TABS Take 1 tablet by mouth daily.    . Multiple Vitamin (MULTIVITAMIN) tablet Take 0.5 tablets by mouth daily.     . nitroGLYCERIN (NITROSTAT) 0.4 MG SL tablet Place 1 tablet (0.4 mg total) under the tongue every 5 (five) minutes as needed for chest pain. 25 tablet 12  . OMEGA-3 1000 MG CAPS Take 4,000 mg by mouth daily.    . pravastatin (PRAVACHOL) 40 MG tablet TAKE 1/2 TABLET BY MOUTH EVERY MONDAY, WEDNESDAY AND FRIDAY. 6 tablet 5  . Probiotic Product (PROBIOTIC DAILY PO) Take 1 capsule by mouth daily.    . sildenafil (REVATIO) 20 MG tablet TAKE  2 TO 3 TABLETS BY MOUTH AS NEEDED FOR SEXUAL ACTIVITY 60 tablet 4   No current facility-administered medications for this visit.      Past Surgical History:  Procedure Laterality Date  . CARDIAC CATHETERIZATION  06/09/1999   mild CAD (LAD, diagonal, RCA) - Dr. Loni Muse. [place]  . CARDIOVERSION N/A 03/25/2017   Procedure: CARDIOVERSION;  Surgeon: Pixie Casino, MD;  Location: [facility] ENDOSCOPY;  Service: Cardiovascular;  Laterality: N/A;  . CAROTID DOPPLER  02/2012   60-79% RICA stenosis, 26-37% LICA stenosis (prior to carotid endarterectomy)  . COLONOSCOPY    . CORONARY STENT INTERVENTION N/A 05/16/2017   Procedure: Coronary Stent Intervention;  Surgeon: Jettie Booze, MD;  Location: [place] CV LAB;  Service: Cardiovascular;  Laterality: N/A;  . ENDARTERECTOMY Right 11/10/2013   Procedure: ENDARTERECTOMY CAROTID;  Surgeon: Conrad Amado, MD;  Location: [place];  Service:  Vascular;  Laterality: Right;  . INGUINAL HERNIA REPAIR Left   . LEFT HEART CATH AND CORONARY ANGIOGRAPHY N/A 05/16/2017   Procedure: Left Heart Cath and Coronary Angiography;  Surgeon: Jettie Booze, MD;  Location: [place] CV LAB;  Service: Cardiovascular;  Laterality: N/A;  . MOLE REMOVAL  1990s   "chest"  . NM MYOCAR PERF WALL MOTION  04/2011   bruce myoview - perfusion defect in inferior myocardium (diaphragmatic attenuation), remaining myocardium with normal perfusion, EF 67%  . PATCH ANGIOPLASTY Right 11/10/2013   Procedure: PATCH ANGIOPLASTY;  Surgeon: Conrad New Deal, MD;  Location: [place];  Service: Vascular;  Laterality: Right;  . PILONIDAL CYST DRAINAGE    . SHOULDER OPEN ROTATOR CUFF REPAIR Bilateral 1998 - 2001  . SKIN CANCER EXCISION  ~ 2015   "chest"  . TONSILLECTOMY       Allergies  Allergen Reactions  . Lipitor [Atorvastatin Calcium] Other (See Comments)    Muscle pain  . Vantin [Cefpodoxime] Rash      Family History  Problem Relation Age of Onset  . Stroke Father   . Hypertension Mother   . Prostate cancer Son   . Stroke Brother   . Cancer Brother        spine     Social History Mr. Strahm reports that he quit smoking about 46 years ago. His smoking use included cigarettes. He has a 18.00 pack-year smoking history. He has never used smokeless tobacco. Mr. Schliep reports current alcohol use of about 14.0 standard drinks of alcohol per week.   Review of Systems CONSTITUTIONAL: No weight loss, fever, chills, weakness or fatigue.  HEENT: Eyes: No visual loss, blurred vision, double vision or yellow sclerae.No hearing loss, sneezing, congestion, runny nose or sore throat.  SKIN: No rash or itching.  CARDIOVASCULAR: per hpi RESPIRATORY: No shortness of breath, cough or sputum.  GASTROINTESTINAL: No anorexia, nausea, vomiting or diarrhea. No abdominal pain or blood.  GENITOURINARY: No burning on urination, no polyuria NEUROLOGICAL: No headache,  dizziness, syncope, paralysis, ataxia, numbness or tingling in the extremities. No change in bowel or bladder control.  MUSCULOSKELETAL: No muscle, back pain, joint pain or stiffness.  LYMPHATICS: No enlarged nodes. No history of splenectomy.  PSYCHIATRIC: No history of depression or anxiety.  ENDOCRINOLOGIC: No reports of sweating, cold or heat intolerance. No polyuria or polydipsia.  Marland Kitchen   Physical Examination Vitals:   12/12/18 1257  BP: (!) 142/60  Pulse: 62  SpO2: 98%   Vitals:   12/12/18 1257  Weight: 192 lb (87.1 kg)  Height: 6' (1.829 m)  Gen: resting comfortably, no acute distress HEENT: no scleral icterus, pupils equal round and reactive, no palptable cervical adenopathy,  CV: RRR, no m/r/g, no jvd Resp: Clear to auscultation bilaterally GI: abdomen is soft, non-tender, non-distended, normal bowel sounds, no hepatosplenomegaly MSK: extremities are warm, no edema.  Skin: warm, no rash Neuro:  no focal deficits Psych: appropriate affect   Diagnostic Studies  05/2017 cath   Mid RCA lesion, 50 %stenosed.  Ost RCA lesion, 25 %stenosed. Lesion noted in small RV marginal.  LM lesion, 25 %stenosed.  2nd Diag lesion, 40 %stenosed.  Mid LAD-1 lesion, 20 %stenosed.  Mid LAD-2 lesion, 95 %stenosed.  A STENT SYNERGY DES 2.5X16 drug eluting stent was successfully placed, postdilated to 3.0 mm.  Post intervention, there is a 0% residual stenosis.  The left ventricular systolic function is normal.  LV end diastolic pressure is moderately elevated.  The left ventricular ejection fraction is 55-65% by visual estimate.  There is no aortic valve stenosis.   LAD was the culprit for his unstable angina. He had symptoms during the procedure at rest. These resolved after stent placement. Watch overnight. Continue IV Angiomax for another hour.  Loaded with Plavix today. We'll start 75 mg daily tomorrow. Given aspirin today as well. Would plan for aspirin, Plavix and  Eliquis for 30 days. After 30 days, would stop the aspirin and continue Plavix and Eliquis. A synergy drug-eluting stent was used since he is on long-term anticoagulation. If there is a bleeding complication, his antiplatelet therapy could be stopped sooner than usual. Ideally, he would get 6-12 months of Plavix depending on his bleeding risk profile.  Possible discharge tomorrow if he does well tonight and with cardiac rehabilitation tomorrow.   05/2017 echo Study Conclusions  - Left ventricle: The cavity size was normal. Wall thickness was   increased in a pattern of mild LVH. Systolic function was normal.   The estimated ejection fraction was in the range of 60% to 65%.   Wall motion was normal; there were no regional wall motion   abnormalities. The study is not technically sufficient to allow   evaluation of LV diastolic function. - Aortic valve: Mildly calcified annulus. Trileaflet; moderately   thickened leaflets. Valve area (VTI): 2.98 cm^2. Valve area   (Vmax): 2.7 cm^2. Valve area (Vmean): 2.56 cm^2. - Left atrium: The atrium was mildly dilated. - Technically adequate study.  Assessment and Plan  1. CAD - no recent symptoms, continue current meds.    2. Hyperlipidemia - LDL above goal. Limited tolerance to statins. He is not in favor or repatha. Will try zetia 10mg  daily  3. Afib - no symptoms, continue current meds including anticoag    Elevated bp today in clinic but home numbers at goal, conitnue to monitor   F/u 6 months   Arnoldo Lenis, M.D.

## 2019-01-02 ENCOUNTER — Other Ambulatory Visit: Payer: Self-pay

## 2019-01-02 DIAGNOSIS — I6523 Occlusion and stenosis of bilateral carotid arteries: Secondary | ICD-10-CM

## 2019-01-02 DIAGNOSIS — Z9889 Other specified postprocedural states: Secondary | ICD-10-CM

## 2019-01-06 DIAGNOSIS — I771 Stricture of artery: Secondary | ICD-10-CM | POA: Diagnosis not present

## 2019-01-06 DIAGNOSIS — C493 Malignant neoplasm of connective and soft tissue of thorax: Secondary | ICD-10-CM | POA: Diagnosis not present

## 2019-01-06 DIAGNOSIS — Z08 Encounter for follow-up examination after completed treatment for malignant neoplasm: Secondary | ICD-10-CM | POA: Diagnosis not present

## 2019-01-06 DIAGNOSIS — M47814 Spondylosis without myelopathy or radiculopathy, thoracic region: Secondary | ICD-10-CM | POA: Diagnosis not present

## 2019-01-06 DIAGNOSIS — Z85831 Personal history of malignant neoplasm of soft tissue: Secondary | ICD-10-CM | POA: Diagnosis not present

## 2019-01-06 DIAGNOSIS — C499 Malignant neoplasm of connective and soft tissue, unspecified: Secondary | ICD-10-CM | POA: Diagnosis not present

## 2019-01-07 ENCOUNTER — Ambulatory Visit: Payer: Medicare Other

## 2019-01-07 ENCOUNTER — Ambulatory Visit (HOSPITAL_COMMUNITY)
Admission: RE | Admit: 2019-01-07 | Discharge: 2019-01-07 | Disposition: A | Discharge status: Home / Self Care | Payer: Medicare Other | Source: Ambulatory Visit | Attending: Family | Admitting: Family

## 2019-01-07 ENCOUNTER — Encounter (HOSPITAL_COMMUNITY): Payer: Medicare Other

## 2019-01-07 ENCOUNTER — Other Ambulatory Visit: Payer: Self-pay

## 2019-01-07 ENCOUNTER — Ambulatory Visit: Payer: Medicare Other | Admitting: Physician Assistant

## 2019-01-07 VITALS — BP 115/58 | HR 55 | Temp 98.1°F | Resp 20 | Ht 72.0 in | Wt 192.0 lb

## 2019-01-07 DIAGNOSIS — Z9889 Other specified postprocedural states: Secondary | ICD-10-CM

## 2019-01-07 DIAGNOSIS — I6523 Occlusion and stenosis of bilateral carotid arteries: Secondary | ICD-10-CM | POA: Diagnosis not present

## 2019-01-07 NOTE — Progress Notes (Signed)
Established Carotid Patient   History of Present Illness   Sean Miranda is a 85 y.o. (Dec 28, 1929) male who presents to go over carotid duplex.  Previous carotid studies demonstrated: RICA 8-50% stenosis, LICA 2-77% stenosis.  Patient has no history of TIA or stroke symptom.  The patient has not had amaurosis fugax or monocular blindness.  The patient has not had facial drooping or hemiplegia.  The patient has not had receptive or expressive aphasia.  He is on aspirin, statin, and Eliquis.  He also denies drop attacks or right arm claudication.  Surgical history significant for R CEA by Dr. Bridgett Larsson 11/2013 for asymptomatic stenosis  The patient's PMH, PSH, SH, and FamHx were reviewed and are unchanged from prior visit.  Current Outpatient Medications  Medication Sig Dispense Refill  . ALPHA LIPOIC ACID PO Take 300 mg by mouth daily.    Marland Kitchen apixaban (ELIQUIS) 5 MG TABS tablet Take 1 tablet (5 mg total) by mouth 2 (two) times daily. 90 tablet 3  . aspirin EC 81 MG tablet Take 81 mg by mouth daily.    . Cholecalciferol (VITAMIN D3) 2000 UNITS capsule Take 2,000 Units by mouth daily.     . Coenzyme Q10-Levocarnitine 100-20 MG CAPS Take 1 capsule by mouth daily.     Marland Kitchen ezetimibe (ZETIA) 10 MG tablet Take 1 tablet (10 mg total) by mouth daily. 90 tablet 3  . Glucosamine-Chondroitin 500-400 MG CAPS Take 1 capsule by mouth daily.     . Magnesium 500 MG TABS Take 1 tablet by mouth daily.    . Multiple Vitamin (MULTIVITAMIN) tablet Take 0.5 tablets by mouth daily.     . OMEGA-3 1000 MG CAPS Take 4,000 mg by mouth daily.    . pravastatin (PRAVACHOL) 40 MG tablet TAKE 1/2 TABLET BY MOUTH EVERY MONDAY, WEDNESDAY AND FRIDAY. 6 tablet 5  . Probiotic Product (PROBIOTIC DAILY PO) Take 1 capsule by mouth daily.    . sildenafil (REVATIO) 20 MG tablet TAKE 2 TO 3 TABLETS BY MOUTH AS NEEDED FOR SEXUAL ACTIVITY 60 tablet 4  . nitroGLYCERIN (NITROSTAT) 0.4 MG SL tablet Place 1 tablet (0.4 mg total) under the  tongue every 5 (five) minutes as needed for chest pain. (Patient not taking: Reported on 01/07/2019) 25 tablet 12   No current facility-administered medications for this visit.     On ROS today: 10 system ROS negative unless otherwise noted in HPI   Physical Examination   Vitals:   01/07/19 1058 01/07/19 1100  BP: 123/62 (!) 115/58  Pulse: (!) 55   Resp: 20   Temp: 98.1 F (36.7 C)   SpO2: 95%   Weight: 192 lb (87.1 kg)   Height: 6' (1.829 m)    Body mass index is 26.04 kg/m.  General Alert, O x 3, WD, NAD  Neck Supple, mid-line trachea,    Pulmonary Sym exp, good B air movt  Cardiac RRR, Nl S1, S2, soft systolic flow murmur  Vascular Vessel Right Left  Radial Palpable Palpable  Brachial Palpable Palpable  Carotid Palpable, No Bruit Palpable, No Bruit  DP Palpable Palpable    Gastro- intestinal soft, non-distended, non-tender to palpation,   Musculo- skeletal M/S 5/5 throughout  , Extremities without ischemic changes    Neurologic Cranial nerves 2-12 intact ,     Non-Invasive Vascular Imaging   B Carotid Duplex (01/07/19):   R ICA stenosis:  1-39%  R VA:  patent and antegrade  L ICA stenosis:  1-39%  L VA:  patent and antegrade   Medical Decision Making   Sean Miranda is a 85 y.o. male who presents to go over carotid duplex; s/p R CEA 11/2013   Unchanged carotid duplex over 1 year interval; surgical site right ICA without recurrent stenosis, left ICA 1 to 39% stenosis  Continue aspirin and statin daily  Follow-up with PCP for other chronic medical conditions  Recheck carotid duplex in 1 year   Dagoberto Ligas PA-C [facility] Office: [phone]  Clinic MD: Dr. Scot Dock

## 2019-02-05 ENCOUNTER — Emergency Department (HOSPITAL_COMMUNITY)
Admission: EM | Admit: 2019-02-05 | Discharge: 2019-02-05 | Disposition: A | Discharge status: Home / Self Care | Payer: Medicare Other | Attending: Emergency Medicine | Admitting: Emergency Medicine

## 2019-02-05 ENCOUNTER — Encounter (HOSPITAL_COMMUNITY): Payer: Self-pay | Admitting: Emergency Medicine

## 2019-02-05 ENCOUNTER — Telehealth: Payer: Self-pay

## 2019-02-05 ENCOUNTER — Emergency Department (HOSPITAL_COMMUNITY): Payer: Medicare Other

## 2019-02-05 ENCOUNTER — Other Ambulatory Visit: Payer: Self-pay

## 2019-02-05 DIAGNOSIS — R0789 Other chest pain: Secondary | ICD-10-CM | POA: Diagnosis not present

## 2019-02-05 DIAGNOSIS — R059 Cough, unspecified: Secondary | ICD-10-CM

## 2019-02-05 DIAGNOSIS — Z87891 Personal history of nicotine dependence: Secondary | ICD-10-CM | POA: Diagnosis not present

## 2019-02-05 DIAGNOSIS — R079 Chest pain, unspecified: Secondary | ICD-10-CM | POA: Diagnosis not present

## 2019-02-05 DIAGNOSIS — Z7982 Long term (current) use of aspirin: Secondary | ICD-10-CM | POA: Diagnosis not present

## 2019-02-05 DIAGNOSIS — I10 Essential (primary) hypertension: Secondary | ICD-10-CM | POA: Insufficient documentation

## 2019-02-05 DIAGNOSIS — R0602 Shortness of breath: Secondary | ICD-10-CM | POA: Diagnosis not present

## 2019-02-05 DIAGNOSIS — R05 Cough: Secondary | ICD-10-CM | POA: Insufficient documentation

## 2019-02-05 DIAGNOSIS — R918 Other nonspecific abnormal finding of lung field: Secondary | ICD-10-CM | POA: Diagnosis not present

## 2019-02-05 DIAGNOSIS — Z79899 Other long term (current) drug therapy: Secondary | ICD-10-CM | POA: Diagnosis not present

## 2019-02-05 LAB — CBC
HCT: 37.9 % — ABNORMAL LOW (ref 39.0–52.0)
Hemoglobin: 12.8 g/dL — ABNORMAL LOW (ref 13.0–17.0)
MCH: 31 pg (ref 26.0–34.0)
MCHC: 33.8 g/dL (ref 30.0–36.0)
MCV: 91.8 fL (ref 80.0–100.0)
Platelets: 331 10*3/uL (ref 150–400)
RBC: 4.13 MIL/uL — ABNORMAL LOW (ref 4.22–5.81)
RDW: 12.3 % (ref 11.5–15.5)
WBC: 8 10*3/uL (ref 4.0–10.5)
nRBC: 0 % (ref 0.0–0.2)

## 2019-02-05 LAB — I-STAT TROPONIN, ED
TROPONIN I, POC: 0.02 ng/mL (ref 0.00–0.08)
Troponin i, poc: 0 ng/mL (ref 0.00–0.08)

## 2019-02-05 LAB — BASIC METABOLIC PANEL
Anion gap: 10 (ref 5–15)
BUN: 11 mg/dL (ref 8–23)
CO2: 25 mmol/L (ref 22–32)
Calcium: 9.1 mg/dL (ref 8.9–10.3)
Chloride: 96 mmol/L — ABNORMAL LOW (ref 98–111)
Creatinine, Ser: 1.01 mg/dL (ref 0.61–1.24)
GFR calc Af Amer: >60 mL/min (ref >60)
GFR calc non Af Amer: >60 mL/min (ref >60)
Glucose, Bld: 87 mg/dL (ref 70–99)
POTASSIUM: 4.2 mmol/L (ref 3.5–5.1)
Sodium: 131 mmol/L — ABNORMAL LOW (ref 135–145)

## 2019-02-05 LAB — CBG MONITORING, ED: Glucose-Capillary: 75 mg/dL (ref 70–99)

## 2019-02-05 MED ORDER — DOXYCYCLINE HYCLATE 100 MG PO TABS
100.0000 mg | ORAL_TABLET | Freq: Once | ORAL | Status: AC
  Administered 2019-02-05: 100 mg via ORAL
  Filled 2019-02-05: qty 1

## 2019-02-05 MED ORDER — DOXYCYCLINE HYCLATE 100 MG PO CAPS: 100.0000 mg | ORAL_CAPSULE | Freq: Two times a day (BID) | ORAL | 0 refills | Status: DC

## 2019-02-05 MED ORDER — IOHEXOL 350 MG/ML SOLN
75.0000 mL | Freq: Once | INTRAVENOUS | Status: AC | PRN
  Administered 2019-02-05: 75 mL via INTRAVENOUS

## 2019-02-05 MED ORDER — ASPIRIN 81 MG PO CHEW
324.0000 mg | CHEWABLE_TABLET | Freq: Once | ORAL | Status: AC
  Administered 2019-02-05: 324 mg via ORAL
  Filled 2019-02-05: qty 4

## 2019-02-05 NOTE — Discharge Instructions (Signed)
You were seen in the ER for intermittent chest pain to the right side.  You reported some previous cough, phlegm and congestion.  CT today showed nodular densities to the right lung that looked infectious in nature.  We will treat you for suspected community-acquired pneumonia with doxycycline.  Take this as prescribed.  You may take Tylenol for associated chest wall pain.  You may develop some fevers, chills, productive cough over the next couple of days but with 48 hours of antibiotics the symptoms should improve.  Return to the ER immediately if there is any worsening cough, chest pain or shortness of breath on exertion, weakness, confusion, exertional chest pain or shortness of breath.  Follow-up with your primary care doctor in 4 to 6 weeks.  You need a repeat imaging study of your chest to make sure these nodular densities have resolved.

## 2019-02-05 NOTE — ED Triage Notes (Addendum)
Pt states he gets a sharp pain in the right side of his chest when he takes a deep breath. Ongoing for two weeks. Denies SOB. Denies any new swelling. Denies N/VD. Pt took 1 nitro today. No relief

## 2019-02-05 NOTE — Telephone Encounter (Signed)
Wife called to state husband was having SSCP, denies radiation, N/V, diaphoresis. Doe have SOB.They just flew back from [place] yesterday, 3 hr flight to [facility] then 2 hr back to [place]. While in [place] he was noticeably SOB when walking, he could not keep pace with others throughout their whole stay. Wife reports stent place 1 1/2 yrs  ago with same presenting symptoms.   Took 1NTG and had not noticed any change, I instructed wife to take him to the [facility] ED

## 2019-02-05 NOTE — ED Notes (Signed)
Pt transported to CT ?

## 2019-02-05 NOTE — ED Provider Notes (Signed)
[facility] EMERGENCY DEPARTMENT Provider Note   CSN: [id number] Arrival date & time: 02/05/19  1550    History   Chief Complaint Chief Complaint  Patient presents with  . Chest Pain    HPI Sean Miranda is a 85 y.o. male with history of superficial leiomyosarcoma of the chest wall status post [facility] S surgery 2013, CAD status post stent DES to LAD, atrial fibrillation on Eliquis, carotid stenosis, hyperlipidemia is here for evaluation of chest pain.  Described as intermittent, sharp, localized to the right chest wall, nonradiating.  Onset 2 weeks ago.  Only with deep inspiration.  Patient and wife recently returned from vacation to [place] last night.  Noticed some increased fatigue and increased exertional dyspnea for the month they were on vacation.  Last week patient had nasal congestion, productive cough, phlegm but this appears to be improving.  Now having dry cough.  Reports he typically has mild, exertional dyspnea but this feels like it is slightly worse.  He has been prescribed nitroglycerin but has never taken it until today, there was no relief after nitroglycerin.  He denies any fever, nausea, vomiting, exertional chest pain, changes in bowel movements, body aches.  Patient flew in from [place] to [place] to [place].  No known sick contacts with cough or URI symptoms.  No history of DVT or PE.  Reports intermittent, ankle edema symmetric in bilateral at the end of the day that is unchanged.  No calf edema or tenderness.  No interventions.  No alleviating factors.     HPI  Past Medical History:  Diagnosis Date  . Arthritis    neck   . Atrial fibrillation ([place])    a. new-onset in 01/2017. Started on Eliquis  . CAD (coronary artery disease), native coronary artery 05/16/2017   LAD 95%>>0 w/ 2.5 x 16 mm Synergy DES, D2 40%, RCA 50%, EF 55-65%, LVEDP mod elevated  . Calcification of coronary artery    a. mild, nonobstructive CAD by cath in 2009.  Marland Kitchen Cancer of skin  of chest   . Carotid artery occlusion    a. s/p R CEA in 2014  . Colon polyps   . Hyperlipidemia    takes Pravastatin every other day  . Pneumonia ~ 01/2013    Patient Active Problem List   Diagnosis Date Noted  . History of right-sided carotid endarterectomy 04/10/2018  . Cancer of skin of chest   . Arthritis   . S/P primary angioplasty with coronary stent 06/12/2017  . Blood clotting disorder ([place]) 04/16/2017  . Atrial fibrillation ([place]) 02/21/2017  . Leiomyosarcoma of chest wall ([place]) 10/31/2015  . Essential hypertension 05/27/2014  . Occlusion and stenosis of carotid artery without mention of cerebral infarction 11/06/2013  . 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 Coronary atherosclerosis 11/03/2010  . Carotid artery disease ([place]) 12/26/2009  . Hyperlipidemia 08/12/2008  . COLONIC POLYPS, HX OF 06/03/2007    Past Surgical History:  Procedure Laterality Date  . CARDIAC CATHETERIZATION  06/09/1999   mild CAD (LAD, diagonal, RCA) - Dr. Loni Muse. [place]  . CARDIOVERSION N/A 03/25/2017   Procedure: CARDIOVERSION;  Surgeon: Pixie Casino, MD;  Location: [facility] ENDOSCOPY;  Service: Cardiovascular;  Laterality: N/A;  . CAROTID DOPPLER  02/2012   60-79% RICA stenosis, 40-98% LICA stenosis (prior to carotid endarterectomy)  . COLONOSCOPY    . CORONARY STENT INTERVENTION N/A 05/16/2017   Procedure: Coronary Stent Intervention;  Surgeon: Jettie Booze, MD;  Location: [facility] CV LAB;  Service: Cardiovascular;  Laterality: N/A;  . ENDARTERECTOMY Right 11/10/2013   Procedure: ENDARTERECTOMY CAROTID;  Surgeon: Conrad Alvan, MD;  Location: [facility];  Service: Vascular;  Laterality: Right;  . INGUINAL HERNIA REPAIR Left   . LEFT HEART CATH AND CORONARY ANGIOGRAPHY N/A 05/16/2017   Procedure: Left Heart Cath and Coronary Angiography;  Surgeon: Jettie Booze, MD;  Location: [place] CV LAB;  Service: Cardiovascular;  Laterality: N/A;  . MOLE REMOVAL  1990s   "chest"  . NM MYOCAR PERF WALL  MOTION  04/2011   bruce myoview - perfusion defect in inferior myocardium (diaphragmatic attenuation), remaining myocardium with normal perfusion, EF 67%  . PATCH ANGIOPLASTY Right 11/10/2013   Procedure: PATCH ANGIOPLASTY;  Surgeon: Conrad Lehigh, MD;  Location: [facility];  Service: Vascular;  Laterality: Right;  . PILONIDAL CYST DRAINAGE    . SHOULDER OPEN ROTATOR CUFF REPAIR Bilateral 1998 - 2001  . SKIN CANCER EXCISION  ~ 2015   "chest"  . TONSILLECTOMY          Home Medications    Prior to Admission medications   Medication Sig Start Date End Date Taking? Authorizing Provider  ALPHA LIPOIC ACID PO Take 300 mg by mouth daily.    [provider]  apixaban (ELIQUIS) 5 MG TABS tablet Take 1 tablet (5 mg total) by mouth 2 (two) times daily. 03/18/18   Pixie Casino, MD  aspirin EC 81 MG tablet Take 81 mg by mouth daily.    [provider]  Cholecalciferol (VITAMIN D3) 2000 UNITS capsule Take 2,000 Units by mouth daily.     [provider]  Coenzyme Q10-Levocarnitine 100-20 MG CAPS Take 1 capsule by mouth daily.     [provider]  doxycycline (VIBRAMYCIN) 100 MG capsule Take 1 capsule (100 mg total) by mouth 2 (two) times daily. 02/05/19   Kinnie Feil, PA-C  ezetimibe (ZETIA) 10 MG tablet Take 1 tablet (10 mg total) by mouth daily. 12/12/18 03/12/19  Arnoldo Lenis, MD  Glucosamine-Chondroitin 500-400 MG CAPS Take 1 capsule by mouth daily.     [provider]  Magnesium 500 MG TABS Take 1 tablet by mouth daily.    [provider]  Multiple Vitamin (MULTIVITAMIN) tablet Take 0.5 tablets by mouth daily.     [provider]  nitroGLYCERIN (NITROSTAT) 0.4 MG SL tablet Place 1 tablet (0.4 mg total) under the tongue every 5 (five) minutes as needed for chest pain. Patient not taking: Reported on 01/07/2019 05/17/17   Barrett, Evelene Croon, PA-C  OMEGA-3 1000 MG CAPS Take 4,000 mg by mouth daily.    [provider]    pravastatin (PRAVACHOL) 40 MG tablet TAKE 1/2 TABLET BY MOUTH EVERY MONDAY, WEDNESDAY AND FRIDAY. 06/30/18   Hilty, Nadean Corwin, MD  Probiotic Product (PROBIOTIC DAILY PO) Take 1 capsule by mouth daily.    [provider]  sildenafil (REVATIO) 20 MG tablet TAKE 2 TO 3 TABLETS BY MOUTH AS NEEDED FOR SEXUAL ACTIVITY 02/07/18   Marletta Lor, MD    Family History Family History  Problem Relation Age of Onset  . Stroke Father   . Hypertension Mother   . Prostate cancer Son   . Stroke Brother   . Cancer Brother        spine    Social History Social History   Tobacco Use  . Smoking status: Former Smoker    Packs/day: 1.00    Years: 18.00  Pack years: 18.00    Types: Cigarettes    Last attempt to quit: 12/03/1972    Years since quitting: 46.2  . Smokeless tobacco: Never Used  Substance Use Topics  . Alcohol use: Yes    Alcohol/week: 14.0 standard drinks    Types: 7 Glasses of wine, 7 Shots of liquor per week  . Drug use: No     Allergies   Lipitor [atorvastatin calcium] and Vantin [cefpodoxime]   Review of Systems Review of Systems  Constitutional: Positive for fatigue.  Respiratory: Positive for shortness of breath (on exertion, chronic).   Cardiovascular: Positive for chest pain.  All other systems reviewed and are negative.    Physical Exam Updated Vital Signs BP (!) 156/65 (BP Location: Right Arm)   Pulse (!) 56   Temp 98.8 F (37.1 C) (Oral)   Resp 16   Ht 6' (1.829 m)   Wt 83 kg   SpO2 97%   BMI 24.82 kg/m   Physical Exam Constitutional:      Appearance: He is well-developed.     Comments: NAD. Non toxic.   HENT:     Head: Normocephalic and atraumatic.     Nose: Nose normal.  Eyes:     General: Lids are normal.     Conjunctiva/sclera: Conjunctivae normal.  Neck:     Musculoskeletal: Normal range of motion.     Trachea: Trachea normal.     Comments: Trachea midline.  Cardiovascular:     Rate and Rhythm: Normal rate and regular  rhythm.     Pulses:          Radial pulses are 1+ on the right side and 1+ on the left side.       Dorsalis pedis pulses are 1+ on the right side and 1+ on the left side.     Heart sounds: Normal heart sounds, S1 normal and S2 normal.     Comments: Trace, symmetric pitting edema behind medial malleoli.  No calf tenderness, asymmetry, erythema, warmth. Pulmonary:     Effort: Pulmonary effort is normal.     Breath sounds: Normal breath sounds.     Comments: Pain with deep inspiration noted.  No chest wall tenderness.  No overlying rash to the chest wall. Abdominal:     General: Bowel sounds are normal.     Palpations: Abdomen is soft.     Tenderness: There is no abdominal tenderness.     Comments: No epigastric tenderness. No distention.   Skin:    General: Skin is warm and dry.     Capillary Refill: Capillary refill takes less than 2 seconds.     Comments: No rash to chest wall  Neurological:     Mental Status: He is alert.     GCS: GCS eye subscore is 4. GCS verbal subscore is 5. GCS motor subscore is 6.  Psychiatric:        Speech: Speech normal.        Behavior: Behavior normal.        Thought Content: Thought content normal.      ED Treatments / Results  Labs (all labs ordered are listed, but only abnormal results are displayed) Labs Reviewed  BASIC METABOLIC PANEL - Abnormal; Notable for the following components:      Result Value   Sodium 131 (*)    Chloride 96 (*)    All other components within normal limits  CBC - Abnormal; Notable for the following components:   RBC  4.13 (*)    Hemoglobin 12.8 (*)    HCT 37.9 (*)    All other components within normal limits  CBG MONITORING, ED  I-STAT TROPONIN, ED  I-STAT TROPONIN, ED    EKG EKG Interpretation  Date/Time:  Thursday February 05 2019 16:08:54 EST Ventricular Rate:  58 PR Interval:    QRS Duration: 157 QT Interval:  483 QTC Calculation: 475 R Axis:   107 Text Interpretation:  Sinus rhythm RBBB and LPFB  Anteroseptal infarct, old Baseline wander in lead(s) II III aVF V6 Confirmed by Gerlene Fee [phone]) on 02/05/2019 4:13:35 PM   Radiology Dg Chest 2 View  Result Date: 02/05/2019 CLINICAL DATA:  Pt states he gets a sharp pain in the right side of his chest when he takes a deep breath. Ongoing for two weeks. Denies SOB. Denies any new swelling. Denies N/VD. hx of PNA, CAD, A-fb. EXAM: CHEST - 2 VIEW COMPARISON:  06/29/2017 FINDINGS: Cardiac silhouette is top-normal in size. No mediastinal or hilar masses. No evidence of adenopathy. Clear lungs.  No pleural effusion or pneumothorax. Skeletal structures are intact IMPRESSION: No active cardiopulmonary disease. Electronically Signed   By: Lajean Manes M.D.   On: 02/05/2019 17:32   Ct Angio Chest Pe W And/or Wo Contrast  Result Date: 02/05/2019 CLINICAL DATA:  Hervey Ard right chest pain when taking a deep breath. EXAM: CT ANGIOGRAPHY CHEST WITH CONTRAST TECHNIQUE: Multidetector CT imaging of the chest was performed using the standard protocol during bolus administration of intravenous contrast. Multiplanar CT image reconstructions and MIPs were obtained to evaluate the vascular anatomy. CONTRAST:  79mL OMNIPAQUE IOHEXOL 350 MG/ML SOLN COMPARISON:  Chest radiographs obtained earlier today. Chest CT dated 08/09/2015. FINDINGS: Cardiovascular: Normally opacified pulmonary arteries with no pulmonary arterial filling defects seen. Atheromatous calcifications, including the coronary arteries and aorta. Minimal pericardial fluid. Mildly enlarged heart, primarily due to biatrial enlargement. Mediastinum/Nodes: Enlarged right hilar node with a short axis diameter of 18 mm on image number 71 series 5. This was not previously enlarged. No enlarged mediastinal or axillary nodes. Lungs/Pleura: 2 small calcified granulomas in the right lower lobe. Stable small right upper lobe nodule measuring 4 mm in maximum diameter on image number 62 series 6. The previously demonstrated 3 mm  posterolateral right upper lobe nodule measures 5 mm on image number 64 series 6. Interval group of nodular densities with scratch the interval group of ill-defined nodular densities with interspersed interstitial density in the posterolateral aspect of the right upper lobe inferiorly, spanning 3.9 x 3.1 cm on image number 78 series 6. Interval similar grouping of nodular densities more superiorly in the right upper lobe medially spanning 5.2 x 3.0 cm on coronal image number 77 series 8. Stable linear scarring in the lingula. Progressive atelectasis/scarring in the anteromedial right upper lobe and right middle lobe. No pleural fluid. Upper Abdomen: Unremarkable. No adrenal masses are seen. Musculoskeletal: Lumbar and lower thoracic spine degenerative changes. Review of the MIP images confirms the above findings. IMPRESSION: 1. No pulmonary emboli. 2. Two interval ill-defined, groups of nodular densities in the right upper lobe, as described above. These are most likely infectious in nature. A neoplastic process is less likely but not excluded. If the patient has clinical signs of infection, treatment with appropriate antibiotics would be recommended followed by a repeat chest CT with contrast in 3 months. If the patient does not have clinical signs of infection, a PET-CT may be indicated. 3. Interval mild right hilar adenopathy, most likely reactive.  A metastatic node could also have this appearance. 4. Two small, stable right upper lobe nodules. 5. Increased atelectasis or scarring in the anteromedial right upper lobe and right middle lobe. 6.  Calcific coronary artery and aortic atherosclerosis. Aortic Atherosclerosis (ICD10-I70.0). Electronically Signed   By: Claudie Revering M.D.   On: 02/05/2019 18:39    Procedures Procedures (including critical care time)  Medications Ordered in ED Medications  aspirin chewable tablet 324 mg (324 mg Oral Given 02/05/19 1645)  iohexol (OMNIPAQUE) 350 MG/ML injection 75 mL  (75 mLs Intravenous Contrast Given 02/05/19 1757)  doxycycline (VIBRA-TABS) tablet 100 mg (100 mg Oral Given 02/05/19 2021)     Initial Impression / Assessment and Plan / ED Course  I have reviewed the triage vital signs and the nursing notes.  Pertinent labs & imaging results that were available during my care of the patient were reviewed by me and considered in my medical decision making (see chart for details).        Given recent URI type symptoms, highest on differential is pneumonia versus costochondritis.  Recently, missed 1-2 doses of Eliquis.  Given this with recent travel and ankle edema, PE also on differential.  He is not describing classic ACS/unstable angina symptoms.  His chest pain is intermittent, sharp, localized, pleuritic.  No changes with exertion, position changes, food intake.  Lower suspicion for pericarditis, endocarditis, GERD, dissection.  Heart cath 2018 showed unstable angina with lesion in the LAD, he was stented.  Has not had exertional chest pain or unstable angina symptoms since.  Chronic mild exertional dyspnea for several months slightly worsened since URI symptoms.  Echo to 2018 with LVEF 60 to 65%.  Does not look overtly fluid overloaded.  Acute heart failure exacerbation is unlikely.  We will obtain screening labs, troponin x2, chest x-ray.  Given high pretest probability we will go ahead and order CT angiography to rule out a PE.   2045: Labs at baseline.  Troponin x2 undetectable.  Chest x-ray negative.  CT angios showed new right lung nodular densities most suspicious for infectious etiology.  Clinically this fits his recent URI symptoms and cough.  There is no PE.  No leukocytosis, fever, Sirs.  EKG without ischemic changes.  Patient has cardiac history however given his clinical presentation, deemed appropriate for discharge.  Chest pain is likely from infectious nodular densities noted on CT.  We will discharge with antibiotics and close follow-up with PCP.  He  has recently traveled but denies sick contact with similar symptoms or trouble through affected areas of COVID-19. Radiology recommending CT follow-up for resolution.  Explained this to patient.  Given underlying history of cancer, he is aware that he needs repeat CT to ensure resolution.  Return precautions were given.  Patient is in agreement with this.  Discussed with EDMD.   Final Clinical Impressions(s) / ED Diagnoses   Final diagnoses:  Abnormal CT scan of lung  Cough    ED Discharge Orders         Ordered    doxycycline (VIBRAMYCIN) 100 MG capsule  2 times daily     02/05/19 1957           Arlean Hopping 02/05/19 2049    Maudie Flakes, MD 02/05/19 2154

## 2019-02-10 ENCOUNTER — Inpatient Hospital Stay: Payer: Medicare Other | Admitting: Family Medicine

## 2019-02-11 ENCOUNTER — Encounter: Payer: Self-pay | Admitting: Family Medicine

## 2019-02-11 ENCOUNTER — Ambulatory Visit (INDEPENDENT_AMBULATORY_CARE_PROVIDER_SITE_OTHER): Payer: Medicare Other | Admitting: Family Medicine

## 2019-02-11 ENCOUNTER — Other Ambulatory Visit: Payer: Self-pay

## 2019-02-11 VITALS — BP 148/80 | HR 54 | Temp 97.8°F | Wt 189.0 lb

## 2019-02-11 DIAGNOSIS — R351 Nocturia: Secondary | ICD-10-CM | POA: Diagnosis not present

## 2019-02-11 DIAGNOSIS — R9389 Abnormal findings on diagnostic imaging of other specified body structures: Secondary | ICD-10-CM | POA: Diagnosis not present

## 2019-02-11 LAB — POC URINALSYSI DIPSTICK (AUTOMATED)
Bilirubin, UA: NEGATIVE
Blood, UA: NEGATIVE
Glucose, UA: NEGATIVE
Ketones, UA: NEGATIVE
Leukocytes, UA: NEGATIVE
Nitrite, UA: NEGATIVE
PH UA: 6.5 (ref 5.0–8.0)
Protein, UA: NEGATIVE
Spec Grav, UA: 1.01 (ref 1.010–1.025)
Urobilinogen, UA: 0.2 E.U./dL

## 2019-02-11 MED ORDER — TAMSULOSIN HCL 0.4 MG PO CAPS: 0.4000 mg | ORAL_CAPSULE | Freq: Every day | ORAL | 0 refills | Status: DC

## 2019-02-11 NOTE — Progress Notes (Addendum)
Subjective:    Patient ID: Sean Miranda, male    DOB: [birth year], 85 y.o.   MRN: [id number]  No chief complaint on file. Patient accompanied by his wife.  HPI Patient was seen today for ED f/u and acute concern.  Pt seen on 02/05/19 for cough, mild exertional dyspnea, fatigue, and sharp chest pain with deep breath.  EKG with sinus rhythm, RBBB, LPFB anterior septal infarct, old baseline wander. Troponin negative x 2.  CXR without active cardiopulmonary disease.  Given h/o missing 1-2 doses of Eliquis,CT angio chest ordered.  Ct with 2 ill-defined groups of nodular densities in the right upper lobe most likely infectious in nature, mild right hilar adenopathy most likely reactive, 2 stable small right upper lobe nodules.  Repeat CT in 3 months recommended.  Pt d/c'd on doxycycline for infectious etiology.  Of note pt recently returned from [place], but denies sick contacts.  Since being home pt states his symptoms have improved.  Pt taking doxycycline without issue.  Pt does note increased urination and nocturia.  Pt denies increased caffeine use, drinking 2 to 3 cups each morning.  Pt's wife mentions he has been drinking more tea and water before bed.  Pt denies difficulty starting stream, weak stream, incomplete bladder emptying, abdominal pressure/pain, back pain, fever, chills, nausea, vomiting.  Past Medical History:  Diagnosis Date  . Arthritis    neck   . Atrial fibrillation ([place])    a. new-onset in 01/2017. Started on Eliquis  . CAD (coronary artery disease), native coronary artery 05/16/2017   LAD 95%>>0 w/ 2.5 x 16 mm Synergy DES, D2 40%, RCA 50%, EF 55-65%, LVEDP mod elevated  . Calcification of coronary artery    a. mild, nonobstructive CAD by cath in 2009.  Marland Kitchen Cancer of skin of chest   . Carotid artery occlusion    a. s/p R CEA in 2014  . Colon polyps   . Hyperlipidemia    takes Pravastatin every other day  . Pneumonia ~ 01/2013    Allergies  Allergen Reactions  . Lipitor  [Atorvastatin Calcium] Other (See Comments)    Muscle pain  . Vantin [Cefpodoxime] Rash    ROS General: Denies fever, chills, night sweats, changes in weight, changes in appetite HEENT: Denies headaches, ear pain, changes in vision, rhinorrhea, sore throat CV: Denies CP, palpitations, SOB, orthopnea Pulm: Denies SOB, wheezing +cough GI: Denies abdominal pain, nausea, vomiting, diarrhea, constipation GU: Denies dysuria, hematuria  +frequency Msk: Denies muscle cramps, joint pains Neuro: Denies weakness, numbness, tingling Skin: Denies rashes, bruising Psych: Denies depression, anxiety, hallucinations      Objective:    Blood pressure (!) 148/80, pulse (!) 54, temperature 97.8 F (36.6 C), temperature source Oral, weight 189 lb (85.7 kg), SpO2 97 %.  Gen. Pleasant, well-nourished, in no distress, normal affect  HEENT: [place]/AT, face symmetric, no scleral icterus, PERRLA, nares patent without drainage, pharynx without erythema or exudate. Lungs: no accessory muscle use, CTAB, no wheezes or rales Cardiovascular: RRR, 2/9 murmur best heard at R upper sternal border, no peripheral edema Abdomen: BS present, soft, NT/ND, no CVA tenderness Neuro:  A&Ox3, CN II-XII intact, normal gait  Wt Readings from Last 3 Encounters:  02/11/19 189 lb (85.7 kg)  02/05/19 183 lb (83 kg)  01/07/19 192 lb (87.1 kg)    Lab Results  Component Value Date   WBC 8.0 02/05/2019   HGB 12.8 (L) 02/05/2019   HCT 37.9 (L) 02/05/2019   PLT 331 02/05/2019  GLUCOSE 87 02/05/2019   CHOL 195 05/12/2018   TRIG 73.0 05/12/2018   HDL 39.60 05/12/2018   LDLDIRECT 149.3 01/05/2014   LDLCALC 141 (H) 05/12/2018   ALT 11 05/12/2018   AST 16 05/12/2018   NA 131 (L) 02/05/2019   K 4.2 02/05/2019   CL 96 (L) 02/05/2019   CREATININE 1.01 02/05/2019   BUN 11 02/05/2019   CO2 25 02/05/2019   TSH 1.57 05/12/2018   PSA 0.33 10/12/2015   INR 0.99 11/09/2013   HGBA1C 5.5 05/12/2018     Assessment/Plan:  Nocturia -UA normal -Discussed possible causes including increased p.o. intake of fluids close to bed/increased caffeine intake -Patient advised to keep bladder diary -Given handout -We will obtain labs - Plan: POCT Urinalysis Dipstick (Automated), PSA, CBC (no diff) -If needed consider referral to urology  Abnormal CT of chest -CT chest 02/05/2019 with RUL nodular densities, mild right hilar adenopathy likely reactive though cannot rule out mets.  Follow-up CT in 3 months recommended -3 month f/u CT chest order placed.  F/u in 2 to 4 weeks, sooner if needed for nocturia.  Grier Mitts, MD

## 2019-02-11 NOTE — Patient Instructions (Addendum)
Urinary Frequency, Adult Urinary frequency means urinating more often than usual. You may urinate every 1-2 hours even though you drink a normal amount of fluid and do not have a bladder infection or condition. Although you urinate more often than normal, the total amount of urine produced in a day is normal. With urinary frequency, you may have an urgent need to urinate often. The stress and anxiety of needing to find a bathroom quickly can make this urge worse. This condition may go away on its own or you may need treatment at home. Home treatment may include bladder training, exercises, taking medicines, or making changes to your diet. Follow these instructions at home: Bladder health   Keep a bladder diary if told by your health care provider. Keep track of: ? What you eat and drink. ? How often you urinate. ? How much you urinate.  Follow a bladder training program if told by your health care provider. This may include: ? Learning to delay going to the bathroom. ? Double urinating (voiding). This helps if you are not completely emptying your bladder. ? Scheduled voiding.  Do Kegel exercises as told by your health care provider. Kegel exercises strengthen the muscles that help control urination, which may help the condition. Eating and drinking  If told by your health care provider, make diet changes, such as: ? Avoiding caffeine. ? Drinking fewer fluids, especially alcohol. ? Not drinking in the evening. ? Avoiding foods or drinks that may irritate the bladder. These include coffee, tea, soda, artificial sweeteners, citrus, tomato-based foods, and chocolate. ? Eating foods that help prevent or ease constipation. Constipation can make this condition worse. Your health care provider may recommend that you:  Drink enough fluid to keep your urine pale yellow.  Take over-the-counter or prescription medicines.  Eat foods that are high in fiber, such as beans, whole grains, and fresh  fruits and vegetables.  Limit foods that are high in fat and processed sugars, such as fried or sweet foods. General instructions  Take over-the-counter and prescription medicines only as told by your health care provider.  Keep all follow-up visits as told by your health care provider. This is important. Contact a health care provider if:  You start urinating more often.  You feel pain or irritation when you urinate.  You notice blood in your urine.  Your urine looks cloudy.  You develop a fever.  You begin vomiting. Get help right away if:  You are unable to urinate. Summary  Urinary frequency means urinating more often than usual. With urinary frequency, you may urinate every 1-2 hours even though you drink a normal amount of fluid and do not have a bladder infection or other bladder condition.  Your health care provider may recommend that you keep a bladder diary, follow a bladder training program, or make dietary changes.  If told by your health care provider, do Kegel exercises to strengthen the muscles that help control urination.  Take over-the-counter and prescription medicines only as told by your health care provider.  Contact a health care provider if your symptoms do not improve or get worse. This information is not intended to replace advice given to you by your health care provider. Make sure you discuss any questions you have with your health care provider. Document Released: 09/15/2009 Document Revised: 05/29/2018 Document Reviewed: 05/29/2018 Elsevier Interactive Patient Education  2019 [employer] Inc[employer]  Benign Prostatic Hyperplasia  Benign prostatic hyperplasia (BPH) is an enlarged prostate gland that is caused  by the normal aging process and not by cancer. The prostate is a walnut-sized gland that is involved in the production of semen. It is located in front of the rectum and below the bladder. The bladder stores urine and the urethra is the tube that  carries the urine out of the body. The prostate may get bigger as a man gets older. An enlarged prostate can press on the urethra. This can make it harder to pass urine. The build-up of urine in the bladder can cause infection. Back pressure and infection may progress to bladder damage and kidney (renal) failure. What are the causes? This condition is part of a normal aging process. However, not all men develop problems from this condition. If the prostate enlarges away from the urethra, urine flow will not be blocked. If it enlarges toward the urethra and compresses it, there will be problems passing urine. What increases the risk? This condition is more likely to develop in men over the age of 35 years. What are the signs or symptoms? Symptoms of this condition include:  Getting up often during the night to urinate.  Needing to urinate frequently during the day.  Difficulty starting urine flow.  Decrease in size and strength of your urine stream.  Leaking (dribbling) after urinating.  Inability to pass urine. This needs immediate treatment.  Inability to completely empty your bladder.  Pain when you pass urine. This is more common if there is also an infection.  Urinary tract infection (UTI). How is this diagnosed? This condition is diagnosed based on your medical history, a physical exam, and your symptoms. Tests will also be done, such as:  A post-void bladder scan. This measures any amount of urine that may remain in your bladder after you finish urinating.  A digital rectal exam. In a rectal exam, your health care provider checks your prostate by putting a lubricated, gloved finger into your rectum to feel the back of your prostate gland. This exam detects the size of your gland and any abnormal lumps or growths.  An exam of your urine (urinalysis).  A prostate specific antigen (PSA) screening. This is a blood test used to screen for prostate cancer.  An ultrasound. This  test uses sound waves to electronically produce a picture of your prostate gland. Your health care provider may refer you to a specialist in kidney and prostate diseases (urologist). How is this treated? Once symptoms begin, your health care provider will monitor your condition (active surveillance or watchful waiting). Treatment for this condition will depend on the severity of your condition. Treatment may include:  Observation and yearly exams. This may be the only treatment needed if your condition and symptoms are mild.  Medicines to relieve your symptoms, including: ? Medicines to shrink the prostate. ? Medicines to relax the muscle of the prostate.  Surgery in severe cases. Surgery may include: ? Prostatectomy. In this procedure, the prostate tissue is removed completely through an open incision or with a laparascope or robotics. ? Transurethral resection of the prostate (TURP). In this procedure, a tool is inserted through the opening at the tip of the penis (urethra). It is used to cut away tissue of the inner core of the prostate. The pieces are removed through the same opening of the penis. This removes the blockage. ? Transurethral incision (TUIP). In this procedure, small cuts are made in the prostate. This lessens the prostate's pressure on the urethra. ? Transurethral microwave thermotherapy (TUMT). This procedure uses microwaves  to create heat. The heat destroys and removes a small amount of prostate tissue. ? Transurethral needle ablation (TUNA). This procedure uses radio frequencies to destroy and remove a small amount of prostate tissue. ? Interstitial laser coagulation ([place]). This procedure uses a laser to destroy and remove a small amount of prostate tissue. ? Transurethral electrovaporization (TUVP). This procedure uses electrodes to destroy and remove a small amount of prostate tissue. ? Prostatic urethral lift. This procedure inserts an implant to push the lobes of the  prostate away from the urethra. Follow these instructions at home:  Take over-the-counter and prescription medicines only as told by your health care provider.  Monitor your symptoms for any changes. Contact your health care provider with any changes.  Avoid drinking large amounts of liquid before going to bed or out in public.  Avoid or reduce how much caffeine or alcohol you drink.  Give yourself time when you urinate.  Keep all follow-up visits as told by your health care provider. This is important. Contact a health care provider if:  You have unexplained back pain.  Your symptoms do not get better with treatment.  You develop side effects from the medicine you are taking.  Your urine becomes very dark or has a bad smell.  Your lower abdomen becomes distended and you have trouble passing your urine. Get help right away if:  You have a fever or chills.  You suddenly cannot urinate.  You feel lightheaded, or very dizzy, or you faint.  There are large amounts of blood or clots in the urine.  Your urinary problems become hard to manage.  You develop moderate to severe low back or flank pain. The flank is the side of your body between the ribs and the hip. These symptoms may represent a serious problem that is an emergency. Do not wait to see if the symptoms will go away. Get medical help right away. Call your local emergency services (911 in the [place] Do not drive yourself to the hospital. Summary  Benign prostatic hyperplasia (BPH) is an enlarged prostate that is caused by the normal aging process and not by cancer.  An enlarged prostate can press on the urethra. This can make it hard to pass urine.  This condition is part of a normal aging process and is more likely to develop in men over the age of 66 years.  Get help right away if you suddenly cannot urinate. This information is not intended to replace advice given to you by your health care provider. Make sure  you discuss any questions you have with your health care provider. Document Released: 11/19/2005 Document Revised: 12/24/2016 Document Reviewed: 12/24/2016 Elsevier Interactive Patient Education  2019 [employer].

## 2019-03-30 ENCOUNTER — Other Ambulatory Visit: Payer: Self-pay

## 2019-03-30 ENCOUNTER — Other Ambulatory Visit: Payer: Self-pay | Admitting: Internal Medicine

## 2019-03-30 MED ORDER — APIXABAN 5 MG PO TABS: ORAL_TABLET | ORAL | 1 refills | Status: DC

## 2019-03-30 NOTE — Telephone Encounter (Signed)
New Message   *STAT* If patient is at the pharmacy, call can be transferred to refill team.   1. Which medications need to be refilled? (please list name of each medication and dose if known) Eliquis 5mg    2. Which pharmacy/location (including street and city if local pharmacy) is medication to be sent to?[facility] in [place]   3. Do they need a 30 day or 90 day supply? 30 day supply

## 2019-04-20 ENCOUNTER — Telehealth: Payer: Self-pay

## 2019-04-20 NOTE — Telephone Encounter (Signed)
Phone apt tomorrow at 1:30 pm for elevated heart after exercise,says he had HR in the 110's, normally runs in the 70's,worried his heart is going too fast    Wanted in office visit, explained Covid and he wold like a TELEPHONE apt       Virtual Visit Pre-Appointment Phone Call  "(Name), I am calling you today to discuss your upcoming appointment. We are currently trying to limit exposure to the virus that causes COVID-19 by seeing patients at home rather than in the office."  1. "What is the BEST phone number to call the day of the visit?" - include this in appointment notes  2. "Do you have or have access to (through a family member/friend) a smartphone with video capability that we can use for your visit?" a. If yes - list this number in appt notes as "cell" (if different from BEST phone #) and list the appointment type as a VIDEO visit in appointment notes b. If no - list the appointment type as a PHONE visit in appointment notes  3. Confirm consent - "In the setting of the current Covid19 crisis, you are scheduled for a (phone or video) visit with your provider on (date) at (time).  Just as we do with many in-office visits, in order for you to participate in this visit, we must obtain consent.  If you'd like, I can send this to your mychart (if signed up) or email for you to review.  Otherwise, I can obtain your verbal consent now.  All virtual visits are billed to your insurance company just like a normal visit would be.  By agreeing to a virtual visit, we'd like you to understand that the technology does not allow for your provider to perform an examination, and thus may limit your provider's ability to fully assess your condition. If your provider identifies any concerns that need to be evaluated in person, we will make arrangements to do so.  Finally, though the technology is pretty good, we cannot assure that it will always work on either your or our end, and in the setting of a video  visit, we may have to convert it to a phone-only visit.  In either situation, we cannot ensure that we have a secure connection.  Are you willing to proceed?" STAFF: Did the patient verbally acknowledge consent to telehealth visit? Document YES/NO here: yes 4.   5. Advise patient to be prepared - "Two hours prior to your appointment, go ahead and check your blood pressure, pulse, oxygen saturation, and your weight (if you have the equipment to check those) and write them all down. When your visit starts, your provider will ask you for this information. If you have an Apple Watch or Kardia device, please plan to have heart rate information ready on the day of your appointment. Please have a pen and paper handy nearby the day of the visit as well."  6. Give patient instructions for MyChart download to smartphone OR Doximity/[email or web address] as below if video visit (depending on what platform provider is using)  7. Inform patient they will receive a phone call 15 minutes prior to their appointment time (may be from unknown caller ID) so they should be prepared to answer    Sean Miranda has been deemed a candidate for a follow-up tele-health visit to limit community exposure during the Covid-19 pandemic. I spoke with the patient via phone to ensure availability of phone/video source, confirm preferred email &  phone number, and discuss instructions and expectations.  I reminded Sean Miranda to be prepared with any vital sign and/or heart rhythm information that could potentially be obtained via home monitoring, at the time of his visit. I reminded Sean Miranda to expect a phone call prior to his visit.  [name], RN 04/20/2019 8:49 AM   INSTRUCTIONS FOR DOWNLOADING THE MYCHART APP TO SMARTPHONE  - The patient must first make sure to have activated MyChart and know their login information - If Apple, go to [employer] and type in MyChart in the search bar and download  the app. If Android, ask patient to go to [employer] and type in [place] in the search bar and download the app. The app is free but as with any other app downloads, their phone may require them to verify saved payment information or Apple/Android password.  - The patient will need to then log into the app with their MyChart username and password, and select [facility] as their healthcare provider to link the account. When it is time for your visit, go to the MyChart app, find appointments, and click Begin Video Visit. Be sure to Select Allow for your device to access the Microphone and Camera for your visit. You will then be connected, and your provider will be with you shortly.  **If they have any issues connecting, or need assistance please contact MyChart service desk [phone])**  **If using a computer, in order to ensure the best quality for their visit they will need to use either of the following Internet Browsers: [employer], or Google Chrome**  IF USING DOXIMITY or DOXY.ME - The patient will receive a link just prior to their visit by text.     FULL LENGTH CONSENT FOR TELE-HEALTH VISIT   I hereby voluntarily request, consent and authorize [facility] and its employed or contracted physicians, physician assistants, nurse practitioners or other licensed health care professionals (the Practitioner), to provide me with telemedicine health care services (the "Services") as deemed necessary by the treating Practitioner. I acknowledge and consent to receive the Services by the Practitioner via telemedicine. I understand that the telemedicine visit will involve communicating with the Practitioner through live audiovisual communication technology and the disclosure of certain medical information by electronic transmission. I acknowledge that I have been given the opportunity to request an in-person assessment or other available alternative prior to the telemedicine  visit and am voluntarily participating in the telemedicine visit.  I understand that I have the right to withhold or withdraw my consent to the use of telemedicine in the course of my care at any time, without affecting my right to future care or treatment, and that the Practitioner or I may terminate the telemedicine visit at any time. I understand that I have the right to inspect all information obtained and/or recorded in the course of the telemedicine visit and may receive copies of available information for a reasonable fee.  I understand that some of the potential risks of receiving the Services via telemedicine include:  [place] Delay or interruption in medical evaluation due to technological equipment failure or disruption; . Information transmitted may not be sufficient (e.g. poor resolution of images) to allow for appropriate medical decision making by the Practitioner; and/or  . In rare instances, security protocols could fail, causing a breach of personal health information.  Furthermore, I acknowledge that it is my responsibility to provide information about my medical history, conditions and care  that is complete and accurate to the best of my ability. I acknowledge that Practitioner's advice, recommendations, and/or decision may be based on factors not within their control, such as incomplete or inaccurate data provided by me or distortions of diagnostic images or specimens that may result from electronic transmissions. I understand that the practice of medicine is not an exact science and that Practitioner makes no warranties or guarantees regarding treatment outcomes. I acknowledge that I will receive a copy of this consent concurrently upon execution via email to the email address I last provided but may also request a printed copy by calling the office of [facility].    I understand that my insurance will be billed for this visit.   I have read or had this consent read to me. . I  understand the contents of this consent, which adequately explains the benefits and risks of the Services being provided via telemedicine.  . I have been provided ample opportunity to ask questions regarding this consent and the Services and have had my questions answered to my satisfaction. . I give my informed consent for the services to be provided through the use of telemedicine in my medical care  By participating in this telemedicine visit I agree to the above.

## 2019-04-21 ENCOUNTER — Telehealth: Payer: Medicare Other | Admitting: Cardiology

## 2019-04-22 ENCOUNTER — Ambulatory Visit (INDEPENDENT_AMBULATORY_CARE_PROVIDER_SITE_OTHER): Payer: Medicare Other | Admitting: Cardiology

## 2019-04-22 ENCOUNTER — Encounter: Payer: Self-pay | Admitting: Cardiology

## 2019-04-22 ENCOUNTER — Other Ambulatory Visit: Payer: Self-pay

## 2019-04-22 VITALS — BP 152/89 | HR 104 | Ht 72.0 in | Wt 190.0 lb

## 2019-04-22 DIAGNOSIS — I480 Paroxysmal atrial fibrillation: Secondary | ICD-10-CM | POA: Diagnosis not present

## 2019-04-22 DIAGNOSIS — R079 Chest pain, unspecified: Secondary | ICD-10-CM

## 2019-04-22 DIAGNOSIS — R0789 Other chest pain: Secondary | ICD-10-CM

## 2019-04-22 DIAGNOSIS — I2510 Atherosclerotic heart disease of native coronary artery without angina pectoris: Secondary | ICD-10-CM

## 2019-04-22 MED ORDER — METOPROLOL TARTRATE 25 MG PO TABS: 12.5000 mg | ORAL_TABLET | Freq: Two times a day (BID) | ORAL | 3 refills | Status: DC

## 2019-04-22 NOTE — Progress Notes (Signed)
Clinical Summary Sean Miranda is a 85 y.o.male seen today for follow up of the following medical problems. This is a focused visit on recent chest pain and elevated heart rates  1. CAD - admit 05/2017 with chest pain - 05/2017 cath as reported below, received a DES to LAD - 05/2017 echo LVEF 60-65%, no WMAs  - works out 3 times a week. Does treadmill x 20 min at regular. Very mild SOB at times.  - compliant with meds.    - 02/2019 ER visit with chest pain - atypical symptoms. Right sided worst with inspiration. Recent URI symptoms.  - 02/2019 CT PE negative though did show nodular densities RUL, recs to repeat CT 3 months  - episode late last week after exercise HR was 106, higher than normal after exercise - at rest running low 100s. No palpitations - had some chest pain on Monday. Right sided, dull pain 4/10 in severity. No other symptoms. Worst with deep breathing, and position. Lasts just a few minutes. Similar to 02/2019 pain      2. Afib - appears diagnosed around 01/2017 during admission. Started on eliquis and lopressor 12.5mg  bid. Due to continued dyspnea attempts were made to get him back into NSR, including multiple cardioversions and starting flecanide. Had tolerated lopressor at the time but stopped taking due to HRs in 40s or 50s, had been on 12.5mg  bid at the time.  - was on flecanide previously, discontued after diagnosed with CAD  - no recent palpitations - no bleeding on eliquis - mild fatigue over the last few weeks.       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  Past Medical History:  Diagnosis Date  . Arthritis    neck   . Atrial fibrillation ([place])    a. new-onset in 01/2017. Started on Eliquis  . CAD (coronary artery disease), native coronary artery 05/16/2017   LAD 95%>>0 w/ 2.5 x 16 mm Synergy DES, D2 40%, RCA 50%, EF 55-65%, LVEDP mod elevated  . Calcification of  coronary artery    a. mild, nonobstructive CAD by cath in 2009.  Marland Kitchen Cancer of skin of chest   . Carotid artery occlusion    a. s/p R CEA in 2014  . Colon polyps   . Hyperlipidemia    takes Pravastatin every other day  . Pneumonia ~ 01/2013     Allergies  Allergen Reactions  . Lipitor [Atorvastatin Calcium] Other (See Comments)    Muscle pain  . Vantin [Cefpodoxime] Rash     Current Outpatient Medications  Medication Sig Dispense Refill  . ALPHA LIPOIC ACID PO Take 300 mg by mouth daily.    Marland Kitchen apixaban (ELIQUIS) 5 MG TABS tablet TAKE (1) TABLET BY MOUTH TWICE DAILY. 180 tablet 1  . Cholecalciferol (VITAMIN D3) 2000 UNITS capsule Take 2,000 Units by mouth daily.     . Coenzyme Q10-Levocarnitine 100-20 MG CAPS Take 1 capsule by mouth daily.     Marland Kitchen doxycycline (VIBRAMYCIN) 100 MG capsule Take 1 capsule (100 mg total) by mouth 2 (two) times daily. 20 capsule 0  . ezetimibe (ZETIA) 10 MG tablet Take 1 tablet (10 mg total) by mouth daily. 90 tablet 3  . Glucosamine-Chondroitin 500-400 MG CAPS Take 1 capsule by mouth daily.     . Magnesium 500 MG TABS Take 1 tablet by mouth daily.    . Multiple Vitamin (MULTIVITAMIN) tablet  Take 0.5 tablets by mouth daily.     . nitroGLYCERIN (NITROSTAT) 0.4 MG SL tablet Place 1 tablet (0.4 mg total) under the tongue every 5 (five) minutes as needed for chest pain. 25 tablet 12  . OMEGA-3 1000 MG CAPS Take 4,000 mg by mouth daily.    . pravastatin (PRAVACHOL) 40 MG tablet TAKE 1/2 TABLET BY MOUTH EVERY MONDAY, WEDNESDAY AND FRIDAY. 6 tablet 5  . Probiotic Product (PROBIOTIC DAILY PO) Take 1 capsule by mouth daily.    . sildenafil (REVATIO) 20 MG tablet TAKE 2 TO 3 TABLETS BY MOUTH AS NEEDED FOR SEXUAL ACTIVITY 60 tablet 4   No current facility-administered medications for this visit.      Past Surgical History:  Procedure Laterality Date  . CARDIAC CATHETERIZATION  06/09/1999   mild CAD (LAD, diagonal, RCA) - Dr. Loni Miranda. [facility]  . CARDIOVERSION N/A  03/25/2017   Procedure: CARDIOVERSION;  Surgeon: Sean Casino, MD;  Location: [facility] ENDOSCOPY;  Service: Cardiovascular;  Laterality: N/A;  . CAROTID DOPPLER  02/2012   60-79% RICA stenosis, 79-89% LICA stenosis (prior to carotid endarterectomy)  . COLONOSCOPY    . CORONARY STENT INTERVENTION N/A 05/16/2017   Procedure: Coronary Stent Intervention;  Surgeon: Sean Booze, MD;  Location: [place] CV LAB;  Service: Cardiovascular;  Laterality: N/A;  . ENDARTERECTOMY Right 11/10/2013   Procedure: ENDARTERECTOMY CAROTID;  Surgeon: Sean Glide, MD;  Location: [place];  Service: Vascular;  Laterality: Right;  . INGUINAL HERNIA REPAIR Left   . LEFT HEART CATH AND CORONARY ANGIOGRAPHY N/A 05/16/2017   Procedure: Left Heart Cath and Coronary Angiography;  Surgeon: Sean Booze, MD;  Location: [place] CV LAB;  Service: Cardiovascular;  Laterality: N/A;  . MOLE REMOVAL  1990s   "chest"  . NM MYOCAR PERF WALL MOTION  04/2011   bruce myoview - perfusion defect in inferior myocardium (diaphragmatic attenuation), remaining myocardium with normal perfusion, EF 67%  . PATCH ANGIOPLASTY Right 11/10/2013   Procedure: PATCH ANGIOPLASTY;  Surgeon: Sean Buxton, MD;  Location: [place];  Service: Vascular;  Laterality: Right;  . PILONIDAL CYST DRAINAGE    . SHOULDER OPEN ROTATOR CUFF REPAIR Bilateral 1998 - 2001  . SKIN CANCER EXCISION  ~ 2015   "chest"  . TONSILLECTOMY       Allergies  Allergen Reactions  . Lipitor [Atorvastatin Calcium] Other (See Comments)    Muscle pain  . Vantin [Cefpodoxime] Rash      Family History  Problem Relation Age of Onset  . Stroke Father   . Hypertension Mother   . Prostate cancer Son   . Stroke Brother   . Cancer Brother        spine     Social History Sean Miranda reports that he quit smoking about 46 years ago. His smoking use included cigarettes. He has a 18.00 pack-year smoking history. He has never used smokeless tobacco. Sean Miranda reports  current alcohol use of about 14.0 standard drinks of alcohol per week.   Review of Systems CONSTITUTIONAL: No weight loss, fever, chills, weakness or fatigue.  HEENT: Eyes: No visual loss, blurred vision, double vision or yellow sclerae.No hearing loss, sneezing, congestion, runny nose or sore throat.  SKIN: No rash or itching.  CARDIOVASCULAR: per hpi RESPIRATORY: No shortness of breath, cough or sputum.  GASTROINTESTINAL: No anorexia, nausea, vomiting or diarrhea. No abdominal pain or blood.  GENITOURINARY: No burning on urination, no polyuria NEUROLOGICAL: No headache, dizziness, syncope, paralysis, ataxia,  numbness or tingling in the extremities. No change in bowel or bladder control.  MUSCULOSKELETAL: No muscle, back pain, joint pain or stiffness.  LYMPHATICS: No enlarged nodes. No history of splenectomy.  PSYCHIATRIC: No history of depression or anxiety.  ENDOCRINOLOGIC: No reports of sweating, cold or heat intolerance. No polyuria or polydipsia.  Marland Kitchen   Physical Examination Today's Vitals   04/22/19 1228  BP: (!) 152/89  Pulse: (!) 104  SpO2: 96%  Weight: 190 lb (86.2 kg)  Height: 6' (1.829 m)   Body mass index is 25.77 kg/m.  Gen: resting comfortably, no acute distress HEENT: no scleral icterus, pupils equal round and reactive, no palptable cervical adenopathy,  CV: irreg, no m/r/g, no jvd Resp: Clear to auscultation bilaterally GI: abdomen is soft, non-tender, non-distended, normal bowel sounds, no hepatosplenomegaly MSK: extremities are warm, no edema.  Skin: warm, no rash Neuro:  no focal deficits Psych: appropriate affect   Diagnostic Studies 05/2017 cath   Mid RCA lesion, 50 %stenosed.  Ost RCA lesion, 25 %stenosed. Lesion noted in small RV marginal.  LM lesion, 25 %stenosed.  2nd Diag lesion, 40 %stenosed.  Mid LAD-1 lesion, 20 %stenosed.  Mid LAD-2 lesion, 95 %stenosed.  A STENT SYNERGY DES 2.5X16 drug eluting stent was successfully placed,  postdilated to 3.0 mm.  Post intervention, there is a 0% residual stenosis.  The left ventricular systolic function is normal.  LV end diastolic pressure is moderately elevated.  The left ventricular ejection fraction is 55-65% by visual estimate.  There is no aortic valve stenosis.  LAD was the culprit for his unstable angina. He had symptoms during the procedure at rest. These resolved after stent placement. Watch overnight. Continue IV Angiomax for another hour.  Loaded with Plavix today. We'll start 75 mg daily tomorrow. Given aspirin today as well. Would plan for aspirin, Plavix and Eliquis for 30 days. After 30 days, would stop the aspirin and continue Plavix and Eliquis. A synergy drug-eluting stent was used since he is on long-term anticoagulation. If there is a bleeding complication, his antiplatelet therapy could be stopped sooner than usual. Ideally, he would get 6-12 months of Plavix depending on his bleeding risk profile.  Possible discharge tomorrow if he does well tonight and with cardiac rehabilitation tomorrow.   05/2017 echo Study Conclusions  - Left ventricle: The cavity size was normal. Wall thickness was increased in a pattern of mild LVH. Systolic function was normal. The estimated ejection fraction was in the range of 60% to 65%. Wall motion was normal; there were no regional wall motion abnormalities. The study is not technically sufficient to allow evaluation of LV diastolic function. - Aortic valve: Mildly calcified annulus. Trileaflet; moderately thickened leaflets. Valve area (VTI): 2.98 cm^2. Valve area (Vmax): 2.7 cm^2. Valve area (Vmean): 2.56 cm^2. - Left atrium: The atrium was mildly dilated. - Technically adequate study.    Assessment and Plan  1. CAD - atypical chest pain, right sided and positional, worst with deep breathing - no further workup planned  2. Afib - history of PAF, multiple failed cardioversions. Flecanide  stopped when CAD was diagnosed after his cath - very mild symptoms, rates above his baseline of mid 50s but reasonable in high 90s to low 100s. Retry low dose lopressor 12.5mg  bid. Really not symptomatic enough or tachy enough I think to consider alternative antiarrhythmic at this time, particularly given limited options with his CAD  - continue [place].  Harl Bowie, M.D..

## 2019-04-22 NOTE — Patient Instructions (Addendum)
Medication Instructions:  Your physician recommends that you continue on your current medications as directed. Please refer to the Current Medication list given to you today.  Start Taking Lopressor 12.5 mg Two times Daily   If you need a refill on your cardiac medications before your next appointment, please call your pharmacy.   Lab work: NONE  If you have labs (blood work) drawn today and your tests are completely normal, you will receive your results only by: Marland Kitchen MyChart Message (if you have MyChart) OR . A paper copy in the mail If you have any lab test that is abnormal or we need to change your treatment, we will call you to review the results.  Testing/Procedures: NONE   Follow-Up: At [facility], you and your health needs are our priority.  As part of our continuing mission to provide you with exceptional heart care, we have created designated Provider Care Teams.  These Care Teams include your primary Cardiologist (physician) and Advanced Practice Providers (APPs -  Physician Assistants and Nurse Practitioners) who all work together to provide you with the care you need, when you need it. You will need a follow up appointment in 4 months.  Please call our office 2 months in advance to schedule this appointment.  You may see Carlyle Dolly, MD or one of the following Advanced Practice Providers on your designated Care Team:   Bernerd Pho, PA-C [facility]) . Ermalinda Barrios, PA-C ([place])  Any Other Special Instructions Will Be Listed Below (If Applicable). Thank you for choosing [facility]!

## 2019-05-01 DIAGNOSIS — R202 Paresthesia of skin: Secondary | ICD-10-CM | POA: Diagnosis not present

## 2019-05-01 DIAGNOSIS — L308 Other specified dermatitis: Secondary | ICD-10-CM | POA: Diagnosis not present

## 2019-05-01 DIAGNOSIS — L814 Other melanin hyperpigmentation: Secondary | ICD-10-CM | POA: Diagnosis not present

## 2019-05-01 DIAGNOSIS — D3610 Benign neoplasm of peripheral nerves and autonomic nervous system, unspecified: Secondary | ICD-10-CM | POA: Diagnosis not present

## 2019-05-06 ENCOUNTER — Encounter: Payer: Self-pay | Admitting: Family Medicine

## 2019-05-06 ENCOUNTER — Other Ambulatory Visit: Payer: Self-pay

## 2019-05-06 ENCOUNTER — Ambulatory Visit (INDEPENDENT_AMBULATORY_CARE_PROVIDER_SITE_OTHER): Payer: Medicare Other | Admitting: Family Medicine

## 2019-05-06 ENCOUNTER — Telehealth: Payer: Self-pay | Admitting: Family Medicine

## 2019-05-06 VITALS — BP 118/68 | HR 56 | Temp 97.8°F | Wt 189.0 lb

## 2019-05-06 DIAGNOSIS — I480 Paroxysmal atrial fibrillation: Secondary | ICD-10-CM | POA: Diagnosis not present

## 2019-05-06 DIAGNOSIS — Z0000 Encounter for general adult medical examination without abnormal findings: Secondary | ICD-10-CM | POA: Diagnosis not present

## 2019-05-06 DIAGNOSIS — E7800 Pure hypercholesterolemia, unspecified: Secondary | ICD-10-CM | POA: Diagnosis not present

## 2019-05-06 DIAGNOSIS — Z0001 Encounter for general adult medical examination with abnormal findings: Secondary | ICD-10-CM | POA: Diagnosis not present

## 2019-05-06 DIAGNOSIS — I10 Essential (primary) hypertension: Secondary | ICD-10-CM | POA: Diagnosis not present

## 2019-05-06 LAB — PSA: PSA: 0.49 ng/mL (ref 0.10–4.00)

## 2019-05-06 LAB — LIPID PANEL
Cholesterol: 203 mg/dL — ABNORMAL HIGH (ref 0–200)
HDL: 48.4 mg/dL (ref >39.00)
LDL Cholesterol: 141 mg/dL — ABNORMAL HIGH (ref 0–99)
NonHDL: 154.4
Total CHOL/HDL Ratio: 4
Triglycerides: 67 mg/dL (ref 0.0–149.0)
VLDL: 13.4 mg/dL (ref 0.0–40.0)

## 2019-05-06 LAB — CBC
HCT: 41.3 % (ref 39.0–52.0)
Hemoglobin: 14.3 g/dL (ref 13.0–17.0)
MCHC: 34.8 g/dL (ref 30.0–36.0)
MCV: 91.2 fl (ref 78.0–100.0)
Platelets: 226 10*3/uL (ref 150.0–400.0)
RBC: 4.52 Mil/uL (ref 4.22–5.81)
RDW: 14.2 % (ref 11.5–15.5)
WBC: 4.9 10*3/uL (ref 4.0–10.5)

## 2019-05-06 LAB — BASIC METABOLIC PANEL
BUN: 14 mg/dL (ref 6–23)
CO2: 28 mEq/L (ref 19–32)
Calcium: 9.1 mg/dL (ref 8.4–10.5)
Chloride: 102 mEq/L (ref 96–112)
Creatinine, Ser: 0.97 mg/dL (ref 0.40–1.50)
GFR: 73.15 mL/min (ref >60.00)
Glucose, Bld: 84 mg/dL (ref 70–99)
Potassium: 4.8 mEq/L (ref 3.5–5.1)
Sodium: 136 mEq/L (ref 135–145)

## 2019-05-06 NOTE — Telephone Encounter (Signed)
Patient had appointment today with Dr. Volanda Napoleon and requested to have his lab results sent to his address.

## 2019-05-06 NOTE — Progress Notes (Signed)
Subjective:   Sean Miranda is a 85 y.o. male who presents for Medicare Annual/Subsequent preventive examination.  Pt states he is doing well overall.  Pt is staying active, exercising.  Followed by Cardiology for afib on eliquis , HTN, carotid artery dz s/p R CEA.  Pt wearing b/l hearing aids.  Has regular f/u with ENT.  Review of Systems:  General: Denies fever, chills, night sweats, changes in weight, changes in appetite HEENT: Denies headaches, ear pain, changes in vision, rhinorrhea, sore throat CV: Denies CP, palpitations, SOB, orthopnea Pulm: Denies SOB, cough, wheezing GI: Denies abdominal pain, nausea, vomiting, diarrhea, constipation GU: Denies dysuria, hematuria, frequency, vaginal discharge Msk: Denies muscle cramps, joint pains Neuro: Denies weakness, numbness, tingling Skin: Denies rashes, bruising Psych: Denies depression, anxiety, hallucinations      Objective:    Vitals: BP 118/68 (BP Location: Left Arm, Patient Position: Sitting, Cuff Size: Normal)   Pulse (!) 56   Temp 97.8 F (36.6 C) (Oral)   Wt 189 lb (85.7 kg)   SpO2 97%   BMI 25.63 kg/m   There is no height or weight on file to calculate BMI.   Gen. Pleasant, well developed, well-nourished, in NAD HEENT - [place]/AT, PERRL, EOMI, conjunctive clear, no scleral icterus, no nasal drainage, pharynx without erythema or exudate. B/L hearing aids in place. Neck: No JVD, no thyromegaly, no carotid bruits Lungs: no use of accessory muscles, CTAB, no wheezes, rales or rhonchi Cardiovascular: RRR, 1/6 murmur, no peripheral edema Abdomen: BS present, soft, nontender,nondistended, no hepatosplenomegaly GU: deferred Musculoskeletal: No deformities, moves all four extremities, no cyanosis or clubbing, normal tone Neuro:  A&Ox3, CN II-XII intact, normal gait Skin:  Warm, dry, intact,  s/p carotid endartectomy Psych: normal affect, mood appropriate   Advanced Directives 02/05/2019 01/07/2019 06/29/2017 05/15/2017  05/14/2017 03/25/2017 02/21/2017  Does Patient Have a Medical Advance Directive? Yes Yes No Yes No Yes Yes  Type of Paramedic of [place];Living will [place];Living will - [place];Living will - Living will -  Does patient want to make changes to medical advance directive? - - - No - Patient declined - - No - Patient declined  Copy of [place] in Chart? - - - No - copy requested - - -  Would patient like information on creating a medical advance directive? - - - - - - -  Pre-existing out of facility DNR order (yellow form or pink MOST form) - - - - - - -    Tobacco Social History   Tobacco Use  Smoking Status Former Smoker  . Packs/day: 1.00  . Years: 18.00  . Pack years: 18.00  . Types: Cigarettes  . Last attempt to quit: 12/03/1972  . Years since quitting: 46.4  Smokeless Tobacco Never Used     Counseling given: Not Answered   Past Medical History:  Diagnosis Date  . Arthritis    neck   . Atrial fibrillation ([place])    a. new-onset in 01/2017. Started on Eliquis  . CAD (coronary artery disease), native coronary artery 05/16/2017   LAD 95%>>0 w/ 2.5 x 16 mm Synergy DES, D2 40%, RCA 50%, EF 55-65%, LVEDP mod elevated  . Calcification of coronary artery    a. mild, nonobstructive CAD by cath in 2009.  Marland Kitchen Cancer of skin of chest   . Carotid artery occlusion    a. s/p R CEA in 2014  . Colon polyps   .  Hyperlipidemia    takes Pravastatin every other day  . Pneumonia ~ 01/2013   Past Surgical History:  Procedure Laterality Date  . CARDIAC CATHETERIZATION  06/09/1999   mild CAD (LAD, diagonal, RCA) - Dr. Loni Muse. [facility]  . CARDIOVERSION N/A 03/25/2017   Procedure: CARDIOVERSION;  Surgeon: Pixie Casino, MD;  Location: [facility] ENDOSCOPY;  Service: Cardiovascular;  Laterality: N/A;  . CAROTID DOPPLER  02/2012   60-79% RICA stenosis, 32-67% LICA stenosis (prior to carotid endarterectomy)  . COLONOSCOPY    .  CORONARY STENT INTERVENTION N/A 05/16/2017   Procedure: Coronary Stent Intervention;  Surgeon: Jettie Booze, MD;  Location: [place] CV LAB;  Service: Cardiovascular;  Laterality: N/A;  . ENDARTERECTOMY Right 11/10/2013   Procedure: ENDARTERECTOMY CAROTID;  Surgeon: Conrad [place], MD;  Location: [place];  Service: Vascular;  Laterality: Right;  . INGUINAL HERNIA REPAIR Left   . LEFT HEART CATH AND CORONARY ANGIOGRAPHY N/A 05/16/2017   Procedure: Left Heart Cath and Coronary Angiography;  Surgeon: Jettie Booze, MD;  Location: [place] CV LAB;  Service: Cardiovascular;  Laterality: N/A;  . MOLE REMOVAL  1990s   "chest"  . NM MYOCAR PERF WALL MOTION  04/2011   bruce myoview - perfusion defect in inferior myocardium (diaphragmatic attenuation), remaining myocardium with normal perfusion, EF 67%  . PATCH ANGIOPLASTY Right 11/10/2013   Procedure: PATCH ANGIOPLASTY;  Surgeon: Conrad Zeeland, MD;  Location: [place];  Service: Vascular;  Laterality: Right;  . PILONIDAL CYST DRAINAGE    . SHOULDER OPEN ROTATOR CUFF REPAIR Bilateral 1998 - 2001  . SKIN CANCER EXCISION  ~ 2015   "chest"  . TONSILLECTOMY     Family History  Problem Relation Age of Onset  . Stroke Father   . Hypertension Mother   . Prostate cancer Son   . Stroke Brother   . Cancer Brother        spine   Social History   Socioeconomic History  . Marital status: Married    Spouse name: Not on file  . Number of children: 3  . Years of education: Not on file  . Highest education level: Not on file  Occupational History    Employer: [employer]  . Financial resource strain: Not on file  . Food insecurity:    Worry: Not on file    Inability: Not on file  . Transportation needs:    Medical: Not on file    Non-medical: Not on file  Tobacco Use  . Smoking status: Former Smoker    Packs/day: 1.00    Years: 18.00    Pack years: 18.00    Types: Cigarettes    Last attempt to quit: 12/03/1972    Years  since quitting: 46.4  . Smokeless tobacco: Never Used  Substance and Sexual Activity  . Alcohol use: Yes    Alcohol/week: 14.0 standard drinks    Types: 7 Glasses of wine, 7 Shots of liquor per week  . Drug use: No  . Sexual activity: Yes  Lifestyle  . Physical activity:    Days per week: Not on file    Minutes per session: Not on file  . Stress: Not on file  Relationships  . Social connections:    Talks on phone: Not on file    Gets together: Not on file    Attends religious service: Not on file    Active member of club or organization: Not on file  Attends meetings of clubs or organizations: Not on file    Relationship status: Not on file  Other Topics Concern  . Not on file  Social History Narrative  . Not on file    Outpatient Encounter Medications as of 05/06/2019  Medication Sig  . ALPHA LIPOIC ACID PO Take 300 mg by mouth daily.  Marland Kitchen apixaban (ELIQUIS) 5 MG TABS tablet TAKE (1) TABLET BY MOUTH TWICE DAILY.  Marland Kitchen Cholecalciferol (VITAMIN D3) 2000 UNITS capsule Take 2,000 Units by mouth daily.   . Coenzyme Q10-Levocarnitine 100-20 MG CAPS Take 1 capsule by mouth daily.   . Glucosamine-Chondroitin 500-400 MG CAPS Take 1 capsule by mouth daily.   . Magnesium 500 MG TABS Take 1 tablet by mouth daily.  . metoprolol tartrate (LOPRESSOR) 25 MG tablet Take 0.5 tablets (12.5 mg total) by mouth 2 (two) times daily.  . Multiple Vitamin (MULTIVITAMIN) tablet Take 0.5 tablets by mouth daily.   . nitroGLYCERIN (NITROSTAT) 0.4 MG SL tablet Place 1 tablet (0.4 mg total) under the tongue every 5 (five) minutes as needed for chest pain.  Marland Kitchen OMEGA-3 1000 MG CAPS Take 4,000 mg by mouth daily.  . Probiotic Product (PROBIOTIC DAILY PO) Take 1 capsule by mouth daily.  . sildenafil (REVATIO) 20 MG tablet TAKE 2 TO 3 TABLETS BY MOUTH AS NEEDED FOR SEXUAL ACTIVITY  . ezetimibe (ZETIA) 10 MG tablet Take 1 tablet (10 mg total) by mouth daily.  . [DISCONTINUED] doxycycline (VIBRAMYCIN) 100 MG capsule  Take 1 capsule (100 mg total) by mouth 2 (two) times daily.   No facility-administered encounter medications on file as of 05/06/2019.     Activities of Daily Living Able to perform ADLs independently  Patient Care Team: Billie Ruddy, MD as PCP - General (Family Medicine) Harl Bowie, Alphonse Guild, MD as PCP - Cardiology (Cardiology) Debara Pickett Nadean Corwin, MD as Consulting Physician (Cardiology)   Assessment:   This is a routine wellness examination for [place].  Exercise Activities and Dietary recommendations   Continue eating a heart healthy diet. Goals   None     Fall Risk Fall Risk  05/06/2019 04/30/2018 12/21/2016 10/31/2015 10/31/2015  Falls in the past year? 0 No No No No   Is the patient's home free of loose throw rugs in walkways, pet beds, electrical cords, etc?   yes      Handrails on the stairs?   yes      Adequate lighting?   yes  Depression Screen PHQ 2/9 Scores 05/06/2019 04/30/2018 12/21/2016 10/31/2015  PHQ - 2 Score 0 0 0 0    Cognitive Function   Alert and appropriate, normal affect.  No cognitive deficit.    Immunization History  Administered Date(s) Administered  . Pneumococcal Conjugate-13 02/04/2015  . Pneumococcal Polysaccharide-23 10/31/2009  . Td 10/31/2009  . Zoster 08/29/2012    Screening Tests Health Maintenance  Topic Date Due  . FOOT EXAM  12/25/1939  . OPHTHALMOLOGY EXAM  12[birth year]25[birth year]1941  . URINE MICROALBUMIN  12/25/1939  . HEMOGLOBIN A1C  11/11/2018  . INFLUENZA VACCINE  07/04/2019  . TETANUS/TDAP  11/01/2019  . PNA vac Low Risk Adult  Completed      Plan:    Preventative health exam/subsequent medicare wellness visit.  CAD -s/p R CEA -continue plavix -continue Ezetimibe 10 mg -continue f/u with Cardiology  HTN -continue Lopresor 12.5 mg BID  HLD -continue Ezetimibe 10 mg  Afib -controlled -continue plavix, lopressor 12.5 mg BID -f/u with Cardiology   I have personally reviewed  and noted the following in the patient's chart:    . Medical and social history . Use of alcohol, tobacco or illicit drugs  . Current medications and supplements . Functional ability and status . Nutritional status . Physical activity . Advanced directives . List of other physicians . Hospitalizations, surgeries, and ER visits in previous 12 months . Vitals . Screenings to include cognitive, depression, and falls . Referrals and appointments  In addition, I have reviewed and discussed with patient certain preventive protocols, quality metrics, and best practice recommendations. A written personalized care plan for preventive services as well as general preventive health recommendations were provided to patient.    F/u in 6 to 12 months, sooner if needed.  Billie Ruddy, MD  05/06/2019

## 2019-05-06 NOTE — Patient Instructions (Signed)
Preventive Care 2 Years and Older, Male Preventive care refers to lifestyle choices and visits with your health care provider that can promote health and wellness. What does preventive care include?   A yearly physical exam. This is also called an annual well check.  Dental exams once or twice a year.  Routine eye exams. Ask your health care provider how often you should have your eyes checked.  Personal lifestyle choices, including: ? Daily care of your teeth and gums. ? Regular physical activity. ? Eating a healthy diet. ? Avoiding tobacco and drug use. ? Limiting alcohol use. ? Practicing safe sex. ? Taking low doses of aspirin every day. ? Taking vitamin and mineral supplements as recommended by your health care provider. What happens during an annual well check? The services and screenings done by your health care provider during your annual well check will depend on your age, overall health, lifestyle risk factors, and family history of disease. Counseling Your health care provider may ask you questions about your:  Alcohol use.  Tobacco use.  Drug use.  Emotional well-being.  Home and relationship well-being.  Sexual activity.  Eating habits.  History of falls.  Memory and ability to understand (cognition).  Work and work Statistician. Screening You may have the following tests or measurements:  Height, weight, and BMI.  Blood pressure.  Lipid and cholesterol levels. These may be checked every 5 years, or more frequently if you are over 9 years old.  Skin check.  Lung cancer screening. You may have this screening every year starting at age 57 if you have a 30-pack-year history of smoking and currently smoke or have quit within the past 15 years.  Colorectal cancer screening. All adults should have this screening starting at age 90 and continuing until age 69. You will have tests every 1-10 years, depending on your results and the type of screening  test. People at increased risk should start screening at an earlier age. Screening tests may include: ? Guaiac-based fecal occult blood testing. ? Fecal immunochemical test (FIT). ? Stool DNA test. ? Virtual colonoscopy. ? Sigmoidoscopy. During this test, a flexible tube with a tiny camera (sigmoidoscope) is used to examine your rectum and lower colon. The sigmoidoscope is inserted through your anus into your rectum and lower colon. ? Colonoscopy. During this test, a long, thin, flexible tube with a tiny camera (colonoscope) is used to examine your entire colon and rectum.  Prostate cancer screening. Recommendations will vary depending on your family history and other risks.  Hepatitis C blood test.  Hepatitis B blood test.  Sexually transmitted disease (STD) testing.  Diabetes screening. This is done by checking your blood sugar (glucose) after you have not eaten for a while (fasting). You may have this done every 1-3 years.  Abdominal aortic aneurysm (AAA) screening. You may need this if you are a current or former smoker.  Osteoporosis. You may be screened starting at age 30 if you are at high risk. Talk with your health care provider about your test results, treatment options, and if necessary, the need for more tests. Vaccines Your health care provider may recommend certain vaccines, such as:  Influenza vaccine. This is recommended every year.  Tetanus, diphtheria, and acellular pertussis (Tdap, Td) vaccine. You may need a Td booster every 10 years.  Varicella vaccine. You may need this if you have not been vaccinated.  Zoster vaccine. You may need this after age 42.  Measles, mumps, and rubella (MMR) vaccine.  You may need at least one dose of MMR if you were born in 1957 or later. You may also need a second dose.  Pneumococcal 13-valent conjugate (PCV13) vaccine. One dose is recommended after age 82.  Pneumococcal polysaccharide (PPSV23) vaccine. One dose is recommended  after age 74.  Meningococcal vaccine. You may need this if you have certain conditions.  Hepatitis A vaccine. You may need this if you have certain conditions or if you travel or work in places where you may be exposed to hepatitis A.  Hepatitis B vaccine. You may need this if you have certain conditions or if you travel or work in places where you may be exposed to hepatitis B.  Haemophilus influenzae type b (Hib) vaccine. You may need this if you have certain risk factors. Talk to your health care provider about which screenings and vaccines you need and how often you need them. This information is not intended to replace advice given to you by your health care provider. Make sure you discuss any questions you have with your health care provider. Document Released: 12/16/2015 Document Revised: 01/09/2018 Document Reviewed: 09/20/2015 Elsevier Interactive Patient Education  2019 [place] Maintenance After Age 7 After age 18, you are at a higher risk for certain long-term diseases and infections as well as injuries from falls. Falls are a major cause of broken bones and head injuries in people who are older than age 20. Getting regular preventive care can help to keep you healthy and well. Preventive care includes getting regular testing and making lifestyle changes as recommended by your health care provider. Talk with your health care provider about:  Which screenings and tests you should have. A screening is a test that checks for a disease when you have no symptoms.  A diet and exercise plan that is right for you. What should I know about screenings and tests to prevent falls? Screening and testing are the best ways to find a health problem early. Early diagnosis and treatment give you the best chance of managing medical conditions that are common after age 50. Certain conditions and lifestyle choices may make you more likely to have a fall. Your health care provider may  recommend:  Regular vision checks. Poor vision and conditions such as cataracts can make you more likely to have a fall. If you wear glasses, make sure to get your prescription updated if your vision changes.  Medicine review. Work with your health care provider to regularly review all of the medicines you are taking, including over-the-counter medicines. Ask your health care provider about any side effects that may make you more likely to have a fall. Tell your health care provider if any medicines that you take make you feel dizzy or sleepy.  Osteoporosis screening. Osteoporosis is a condition that causes the bones to get weaker. This can make the bones weak and cause them to break more easily.  Blood pressure screening. Blood pressure changes and medicines to control blood pressure can make you feel dizzy.  Strength and balance checks. Your health care provider may recommend certain tests to check your strength and balance while standing, walking, or changing positions.  Foot health exam. Foot pain and numbness, as well as not wearing proper footwear, can make you more likely to have a fall.  Depression screening. You may be more likely to have a fall if you have a fear of falling, feel emotionally low, or feel unable to do activities that you used  to do.  Alcohol use screening. Using too much alcohol can affect your balance and may make you more likely to have a fall. What actions can I take to lower my risk of falls? General instructions  Talk with your health care provider about your risks for falling. Tell your health care provider if: ? You fall. Be sure to tell your health care provider about all falls, even ones that seem minor. ? You feel dizzy, sleepy, or off-balance.  Take over-the-counter and prescription medicines only as told by your health care provider. These include any supplements.  Eat a healthy diet and maintain a healthy weight. A healthy diet includes low-fat dairy  products, low-fat (lean) meats, and fiber from whole grains, beans, and lots of fruits and vegetables. Home safety  Remove any tripping hazards, such as rugs, cords, and clutter.  Install safety equipment such as grab bars in bathrooms and safety rails on stairs.  Keep rooms and walkways well-lit. Activity   Follow a regular exercise program to stay fit. This will help you maintain your balance. Ask your health care provider what types of exercise are appropriate for you.  If you need a cane or walker, use it as recommended by your health care provider.  Wear supportive shoes that have nonskid soles. Lifestyle  Do not drink alcohol if your health care provider tells you not to drink.  If you drink alcohol, limit how much you have: ? 0-1 drink a day for women. ? 0-2 drinks a day for men.  Be aware of how much alcohol is in your drink. In the [place] one drink equals one typical bottle of beer (12 oz), one-half glass of wine (5 oz), or one shot of hard liquor (1 oz).  Do not use any products that contain nicotine or tobacco, such as cigarettes and e-cigarettes. If you need help quitting, ask your health care provider. Summary  Having a healthy lifestyle and getting preventive care can help to protect your health and wellness after age 36.  Screening and testing are the best way to find a health problem early and help you avoid having a fall. Early diagnosis and treatment give you the best chance for managing medical conditions that are more common for people who are older than age 55.  Falls are a major cause of broken bones and head injuries in people who are older than age 33. Take precautions to prevent a fall at home.  Work with your health care provider to learn what changes you can make to improve your health and wellness and to prevent falls. This information is not intended to replace advice given to you by your health care provider. Make sure you discuss any questions you  have with your health care provider. Document Released: 10/02/2017 Document Revised: 10/02/2017 Document Reviewed: 10/02/2017 Elsevier Interactive Patient Education  2019 [employer]  Cholesterol  Cholesterol is a fat. Your body needs a small amount of cholesterol. Cholesterol (plaque) may build up in your blood vessels (arteries). That makes you more likely to have a heart attack or stroke. You cannot feel your cholesterol level. Having a blood test is the only way to find out if your level is high. Keep your test results. Work with your doctor to keep your cholesterol at a good level. What do the results mean?  Total cholesterol is how much cholesterol is in your blood.  LDL is bad cholesterol. This is the type that can build up. Try to have low  LDL.  HDL is good cholesterol. It cleans your blood vessels and carries LDL away. Try to have high HDL.  Triglycerides are fat that the body can store or burn for energy. What are good levels of cholesterol?  Total cholesterol below 200.  LDL below 100 is good for people who have health risks. LDL below 70 is good for people who have very high risks.  HDL above 40 is good. It is best to have HDL of 60 or higher.  Triglycerides below 150. How can I lower my cholesterol? Diet Follow your diet program as told by your doctor.  Choose fish, white meat chicken, or [place] that is roasted or baked. Try not to eat red meat, fried foods, sausage, or lunch meats.  Eat lots of fresh fruits and vegetables.  Choose whole grains, beans, pasta, potatoes, and cereals.  Choose olive oil, corn oil, or canola oil. Only use small amounts.  Try not to eat butter, mayonnaise, shortening, or palm kernel oils.  Try not to eat foods with trans fats.  Choose low-fat or nonfat dairy foods. ? Drink skim or nonfat milk. ? Eat low-fat or nonfat yogurt and cheeses. ? Try not to drink whole milk or cream. ? Try not to eat ice cream, egg yolks, or full-fat  cheeses.  Healthy desserts include angel food cake, ginger snaps, animal crackers, hard candy, popsicles, and low-fat or nonfat frozen yogurt. Try not to eat pastries, cakes, pies, and cookies.  Exercise Follow your exercise program as told by your doctor.  Be more active. Try gardening, walking, and taking the stairs.  Ask your doctor about ways that you can be more active. Medicine  Take over-the-counter and prescription medicines only as told by your doctor. This information is not intended to replace advice given to you by your health care provider. Make sure you discuss any questions you have with your health care provider. Document Released: 02/15/2009 Document Revised: 06/20/2016 Document Reviewed: 05/31/2016 Elsevier Interactive Patient Education  2019 [employer].

## 2019-05-06 NOTE — Telephone Encounter (Signed)
Noted  

## 2019-05-12 DIAGNOSIS — I4891 Unspecified atrial fibrillation: Secondary | ICD-10-CM | POA: Diagnosis not present

## 2019-05-12 DIAGNOSIS — R9389 Abnormal findings on diagnostic imaging of other specified body structures: Secondary | ICD-10-CM | POA: Diagnosis not present

## 2019-05-12 DIAGNOSIS — I2510 Atherosclerotic heart disease of native coronary artery without angina pectoris: Secondary | ICD-10-CM | POA: Diagnosis not present

## 2019-05-12 NOTE — Telephone Encounter (Signed)
Pt labs were mailed to his address on pt chart

## 2019-05-25 ENCOUNTER — Other Ambulatory Visit (HOSPITAL_COMMUNITY): Payer: Self-pay | Admitting: Pulmonary Disease

## 2019-05-25 ENCOUNTER — Other Ambulatory Visit: Payer: Self-pay | Admitting: Pulmonary Disease

## 2019-05-25 DIAGNOSIS — R9389 Abnormal findings on diagnostic imaging of other specified body structures: Secondary | ICD-10-CM

## 2019-06-11 ENCOUNTER — Other Ambulatory Visit: Payer: Self-pay

## 2019-06-11 ENCOUNTER — Ambulatory Visit (HOSPITAL_COMMUNITY)
Admission: RE | Admit: 2019-06-11 | Discharge: 2019-06-11 | Disposition: A | Discharge status: Home / Self Care | Payer: Medicare Other | Source: Ambulatory Visit | Attending: Pulmonary Disease | Admitting: Pulmonary Disease

## 2019-06-11 DIAGNOSIS — R9389 Abnormal findings on diagnostic imaging of other specified body structures: Secondary | ICD-10-CM | POA: Diagnosis not present

## 2019-06-11 DIAGNOSIS — R918 Other nonspecific abnormal finding of lung field: Secondary | ICD-10-CM | POA: Diagnosis not present

## 2019-06-11 DIAGNOSIS — I2510 Atherosclerotic heart disease of native coronary artery without angina pectoris: Secondary | ICD-10-CM | POA: Diagnosis not present

## 2019-06-11 MED ORDER — IOHEXOL 300 MG/ML  SOLN
75.0000 mL | Freq: Once | INTRAMUSCULAR | Status: AC | PRN
  Administered 2019-06-11: 75 mL via INTRAVENOUS

## 2019-06-22 DIAGNOSIS — H40013 Open angle with borderline findings, low risk, bilateral: Secondary | ICD-10-CM | POA: Diagnosis not present

## 2019-06-22 DIAGNOSIS — H25813 Combined forms of age-related cataract, bilateral: Secondary | ICD-10-CM | POA: Diagnosis not present

## 2019-06-25 ENCOUNTER — Telehealth: Payer: Self-pay | Admitting: Family Medicine

## 2019-06-25 NOTE — Telephone Encounter (Signed)
Dr. Banks please advise  

## 2019-06-25 NOTE — Telephone Encounter (Signed)
Rx called into pharmacy and LVM for patient to let rx has been called in

## 2019-06-25 NOTE — Telephone Encounter (Signed)
Ok to send sildenafil 20 mg with previous instructions.  Disp 60 tabs.  Refills 2

## 2019-06-25 NOTE — Telephone Encounter (Signed)
Patient received a prescription for Sildenafil 20 mg from Dr Burnice Logan when he was with him.  He is now out of this medication and needs a refill.  Pharmacy faxed a request about 10 days ago.   Pharmacy- [place], [place]

## 2019-07-22 DIAGNOSIS — R202 Paresthesia of skin: Secondary | ICD-10-CM | POA: Diagnosis not present

## 2019-08-03 ENCOUNTER — Emergency Department (HOSPITAL_COMMUNITY): Payer: Medicare Other

## 2019-08-03 ENCOUNTER — Emergency Department (HOSPITAL_COMMUNITY)
Admission: EM | Admit: 2019-08-03 | Discharge: 2019-08-03 | Disposition: A | Discharge status: Home / Self Care | Payer: Medicare Other | Attending: Emergency Medicine | Admitting: Emergency Medicine

## 2019-08-03 ENCOUNTER — Encounter (HOSPITAL_COMMUNITY): Payer: Self-pay | Admitting: Emergency Medicine

## 2019-08-03 ENCOUNTER — Other Ambulatory Visit: Payer: Self-pay

## 2019-08-03 DIAGNOSIS — Z79899 Other long term (current) drug therapy: Secondary | ICD-10-CM | POA: Insufficient documentation

## 2019-08-03 DIAGNOSIS — I480 Paroxysmal atrial fibrillation: Secondary | ICD-10-CM | POA: Insufficient documentation

## 2019-08-03 DIAGNOSIS — I2510 Atherosclerotic heart disease of native coronary artery without angina pectoris: Secondary | ICD-10-CM | POA: Insufficient documentation

## 2019-08-03 DIAGNOSIS — Z87891 Personal history of nicotine dependence: Secondary | ICD-10-CM | POA: Insufficient documentation

## 2019-08-03 DIAGNOSIS — R079 Chest pain, unspecified: Secondary | ICD-10-CM

## 2019-08-03 DIAGNOSIS — R0789 Other chest pain: Secondary | ICD-10-CM | POA: Diagnosis not present

## 2019-08-03 DIAGNOSIS — I10 Essential (primary) hypertension: Secondary | ICD-10-CM | POA: Insufficient documentation

## 2019-08-03 LAB — CBC WITH DIFFERENTIAL/PLATELET
Abs Immature Granulocytes: 0.01 10*3/uL (ref 0.00–0.07)
Basophils Absolute: 0 10*3/uL (ref 0.0–0.1)
Basophils Relative: 0 %
Eosinophils Absolute: 0.4 10*3/uL (ref 0.0–0.5)
Eosinophils Relative: 7 %
HCT: 45.8 % (ref 39.0–52.0)
Hemoglobin: 15.7 g/dL (ref 13.0–17.0)
Immature Granulocytes: 0 %
Lymphocytes Relative: 37 %
Lymphs Abs: 2 10*3/uL (ref 0.7–4.0)
MCH: 32.1 pg (ref 26.0–34.0)
MCHC: 34.3 g/dL (ref 30.0–36.0)
MCV: 93.7 fL (ref 80.0–100.0)
Monocytes Absolute: 0.5 10*3/uL (ref 0.1–1.0)
Monocytes Relative: 10 %
Neutro Abs: 2.5 10*3/uL (ref 1.7–7.7)
Neutrophils Relative %: 46 %
Platelets: 213 10*3/uL (ref 150–400)
RBC: 4.89 MIL/uL (ref 4.22–5.81)
RDW: 13.2 % (ref 11.5–15.5)
WBC: 5.4 10*3/uL (ref 4.0–10.5)
nRBC: 0 % (ref 0.0–0.2)

## 2019-08-03 LAB — COMPREHENSIVE METABOLIC PANEL
ALT: 14 U/L (ref 0–44)
AST: 21 U/L (ref 15–41)
Albumin: 4.3 g/dL (ref 3.5–5.0)
Alkaline Phosphatase: 84 U/L (ref 38–126)
Anion gap: 8 (ref 5–15)
BUN: 16 mg/dL (ref 8–23)
CO2: 26 mmol/L (ref 22–32)
Calcium: 9.5 mg/dL (ref 8.9–10.3)
Chloride: 103 mmol/L (ref 98–111)
Creatinine, Ser: 0.98 mg/dL (ref 0.61–1.24)
GFR calc Af Amer: >60 mL/min (ref >60)
GFR calc non Af Amer: >60 mL/min (ref >60)
Glucose, Bld: 96 mg/dL (ref 70–99)
Potassium: 3.9 mmol/L (ref 3.5–5.1)
Sodium: 137 mmol/L (ref 135–145)
Total Bilirubin: 0.4 mg/dL (ref 0.3–1.2)
Total Protein: 7.9 g/dL (ref 6.5–8.1)

## 2019-08-03 LAB — TROPONIN I (HIGH SENSITIVITY)
Troponin I (High Sensitivity): 4 ng/L (ref <18)
Troponin I (High Sensitivity): 6 ng/L (ref <18)

## 2019-08-03 MED ORDER — NITROGLYCERIN 0.4 MG SL SUBL: 0.4000 mg | SUBLINGUAL_TABLET | SUBLINGUAL | 0 refills | Status: DC | PRN

## 2019-08-03 MED ORDER — NITROGLYCERIN 2 % TD OINT
1.0000 [in_us] | TOPICAL_OINTMENT | Freq: Once | TRANSDERMAL | Status: AC
  Administered 2019-08-03: 02:00:00 1 [in_us] via TOPICAL
  Filled 2019-08-03: qty 1

## 2019-08-03 NOTE — ED Triage Notes (Signed)
Pt C/o mid chest pain and "heart racing" that woke him up from sleep. Pt states the pain is there all the time.

## 2019-08-03 NOTE — ED Provider Notes (Signed)
[facility] EMERGENCY DEPARTMENT Provider Note   CSN: [id number] Arrival date & time: 08/03/19  0020   Time seen 12:40 AM  History   Chief Complaint Chief Complaint  Patient presents with  . Chest Pain    HPI Sean Miranda is a 85 y.o. male.     HPI patient reports about 11 PM he was awakened from sleep with pain in his central and left lower chest that was described as dull and lasted 30 to 45 minutes however he then told me he was still having some mild discomfort.  The pain did not radiate, it did not make him feel short of breath, diaphoretic, nauseated.  He states he checked his heart rate and it was 102 and his heart rate is normally in the 50s.  He states he could not tell that his heart was beating fast.  He took his metoprolol this evening which was his usually evening dose.  He states he is never had this chest pain before.  However I noted he has had a cardiac stent before and he did have chest pain at that time.  He cannot remember if they were similar.  He has not taken any aspirin tonight or nitroglycerin.  He states he takes Eliquis for atrial fibrillation.  He is followed by Dr. Harl Bowie and he and his wife both asked me what atrial fibrillation is.  PCP Billie Ruddy, MD Cardiology Dr. Harl Bowie  Past Medical History:  Diagnosis Date  . Arthritis    neck   . Atrial fibrillation ([place])    a. new-onset in 01/2017. Started on Eliquis  . CAD (coronary artery disease), native coronary artery 05/16/2017   LAD 95%>>0 w/ 2.5 x 16 mm Synergy DES, D2 40%, RCA 50%, EF 55-65%, LVEDP mod elevated  . Calcification of coronary artery    a. mild, nonobstructive CAD by cath in 2009.  Marland Kitchen Cancer of skin of chest   . Carotid artery occlusion    a. s/p R CEA in 2014  . Colon polyps   . Hyperlipidemia    takes Pravastatin every other day  . Pneumonia ~ 01/2013    Patient Active Problem List   Diagnosis Date Noted  . History of right-sided carotid endarterectomy 04/10/2018  .  Cancer of skin of chest   . Arthritis   . S/P primary angioplasty with coronary stent 06/12/2017  . Blood clotting disorder ([place]) 04/16/2017  . Atrial fibrillation ([place]) 02/21/2017  . 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 Essential hypertension 05/27/2014  . Occlusion and stenosis of carotid artery without mention of cerebral infarction 11/06/2013  . 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 Coronary atherosclerosis 11/03/2010  . Carotid artery disease ([place]) 12/26/2009  . Hyperlipidemia 08/12/2008  . COLONIC POLYPS, HX OF 06/03/2007    Past Surgical History:  Procedure Laterality Date  . CARDIAC CATHETERIZATION  06/09/1999   mild CAD (LAD, diagonal, RCA) - Dr. Loni Muse. [place]  . CARDIOVERSION N/A 03/25/2017   Procedure: CARDIOVERSION;  Surgeon: Pixie Casino, MD;  Location: [facility] ENDOSCOPY;  Service: Cardiovascular;  Laterality: N/A;  . CAROTID DOPPLER  02/2012   60-79% RICA stenosis, 123456 LICA stenosis (prior to carotid endarterectomy)  . COLONOSCOPY    . CORONARY STENT INTERVENTION N/A 05/16/2017   Procedure: Coronary Stent Intervention;  Surgeon: Jettie Booze, MD;  Location: [facility] CV LAB;  Service: Cardiovascular;  Laterality: N/A;  . ENDARTERECTOMY Right 11/10/2013   Procedure: ENDARTERECTOMY CAROTID;  Surgeon: Conrad Franklin,  MD;  Location: [place];  Service: Vascular;  Laterality: Right;  . INGUINAL HERNIA REPAIR Left   . LEFT HEART CATH AND CORONARY ANGIOGRAPHY N/A 05/16/2017   Procedure: Left Heart Cath and Coronary Angiography;  Surgeon: Jettie Booze, MD;  Location: [facility];  Service: Cardiovascular;  Laterality: N/A;  . MOLE REMOVAL  1990s   "chest"  . NM MYOCAR PERF WALL MOTION  04/2011   bruce myoview - perfusion defect in inferior myocardium (diaphragmatic attenuation), remaining myocardium with normal perfusion, EF 67%  . PATCH ANGIOPLASTY Right 11/10/2013   Procedure: PATCH ANGIOPLASTY;  Surgeon: Conrad [place], MD;  Location: [place];  Service:  Vascular;  Laterality: Right;  . PILONIDAL CYST DRAINAGE    . SHOULDER OPEN ROTATOR CUFF REPAIR Bilateral 1998 - 2001  . SKIN CANCER EXCISION  ~ 2015   "chest"  . TONSILLECTOMY          Home Medications    Prior to Admission medications   Medication Sig Start Date End Date Taking? Authorizing Provider  ALPHA LIPOIC ACID PO Take 300 mg by mouth daily.    [provider]  apixaban (ELIQUIS) 5 MG TABS tablet TAKE (1) TABLET BY MOUTH TWICE DAILY. 03/30/19   Pixie Casino, MD  Cholecalciferol (VITAMIN D3) 2000 UNITS capsule Take 2,000 Units by mouth daily.     [provider]  Coenzyme Q10-Levocarnitine 100-20 MG CAPS Take 1 capsule by mouth daily.     [provider]  ezetimibe (ZETIA) 10 MG tablet Take 1 tablet (10 mg total) by mouth daily. 12/12/18 04/22/19  Arnoldo Lenis, MD  Glucosamine-Chondroitin 500-400 MG CAPS Take 1 capsule by mouth daily.     [provider]  Magnesium 500 MG TABS Take 1 tablet by mouth daily.    [provider]  metoprolol tartrate (LOPRESSOR) 25 MG tablet Take 0.5 tablets (12.5 mg total) by mouth 2 (two) times daily. 04/22/19 07/21/19  Arnoldo Lenis, MD  Multiple Vitamin (MULTIVITAMIN) tablet Take 0.5 tablets by mouth daily.     [provider]  nitroGLYCERIN (NITROSTAT) 0.4 MG SL tablet Place 1 tablet (0.4 mg total) under the tongue every 5 (five) minutes as needed for chest pain. 08/03/19   Rolland Porter, MD  OMEGA-3 1000 MG CAPS Take 4,000 mg by mouth daily.    [provider]  Probiotic Product (PROBIOTIC DAILY PO) Take 1 capsule by mouth daily.    [provider]  sildenafil (REVATIO) 20 MG tablet TAKE 2 TO 3 TABLETS BY MOUTH AS NEEDED FOR SEXUAL ACTIVITY 02/07/18   Marletta Lor, MD    Family History Family History  Problem Relation Age of Onset  . Stroke Father   . Hypertension Mother   . Prostate cancer Son   . Stroke Brother   . Cancer Brother        spine     Social History Social History   Tobacco Use  . Smoking status: Former Smoker    Packs/day: 1.00    Years: 18.00    Pack years: 18.00    Types: Cigarettes    Quit date: 12/03/1972    Years since quitting: 46.6  . Smokeless tobacco: Never Used  Substance Use Topics  . Alcohol use: Yes    Alcohol/week: 14.0 standard drinks    Types: 7 Glasses of wine, 7 Shots of liquor per week  . Drug use: No  Lives at home Lives with spouse   Allergies  Lipitor [atorvastatin calcium] and Vantin [cefpodoxime]   Review of Systems Review of Systems  All other systems reviewed and are negative.    Physical Exam Updated Vital Signs BP (!) 92/58   Pulse (!) 57   Temp 98.3 F (36.8 C) (Oral)   Resp 13   Wt 81.6 kg   SpO2 96%   BMI 24.41 kg/m   Physical Exam Vitals signs and nursing note reviewed.  Constitutional:      General: He is not in acute distress.    Appearance: Normal appearance. He is well-developed. He is not ill-appearing or toxic-appearing.  HENT:     Head: Normocephalic and atraumatic.     Right Ear: External ear normal.     Left Ear: External ear normal.     Nose: Nose normal. No mucosal edema or rhinorrhea.     Mouth/Throat:     Mouth: Mucous membranes are moist.     Dentition: No dental abscesses.     Pharynx: Oropharynx is clear. No oropharyngeal exudate, posterior oropharyngeal erythema or uvula swelling.  Eyes:     Extraocular Movements: Extraocular movements intact.     Conjunctiva/sclera: Conjunctivae normal.     Pupils: Pupils are equal, round, and reactive to light.  Neck:     Musculoskeletal: Full passive range of motion without pain, normal range of motion and neck supple.  Cardiovascular:     Rate and Rhythm: Normal rate. Rhythm irregular.     Heart sounds: Normal heart sounds. No murmur. No friction rub. No gallop.   Pulmonary:     Effort: Pulmonary effort is normal. No respiratory distress.     Breath sounds: Normal breath sounds. No wheezing,  rhonchi or rales.  Chest:     Chest wall: No tenderness or crepitus.  Abdominal:     General: Bowel sounds are normal. There is no distension.     Palpations: Abdomen is soft.     Tenderness: There is no abdominal tenderness. There is no guarding or rebound.  Musculoskeletal: Normal range of motion.        General: No tenderness.     Right lower leg: No edema.     Left lower leg: No edema.     Comments: Moves all extremities well.   Skin:    General: Skin is warm and dry.     Coloration: Skin is not pale.     Findings: No erythema or rash.  Neurological:     General: No focal deficit present.     Mental Status: He is alert and oriented to person, place, and time.     Cranial Nerves: No cranial nerve deficit.  Psychiatric:        Mood and Affect: Mood normal. Mood is not anxious.        Speech: Speech normal.        Behavior: Behavior normal.        Thought Content: Thought content normal.      ED Treatments / Results  Labs (all labs ordered are listed, but only abnormal results are displayed) Results for orders placed or performed during the hospital encounter of 08/03/19  Comprehensive metabolic panel  Result Value Ref Range   Sodium 137 135 - 145 mmol/L   Potassium 3.9 3.5 - 5.1 mmol/L   Chloride 103 98 - 111 mmol/L   CO2 26 22 - 32 mmol/L   Glucose, Bld 96 70 - 99 mg/dL   BUN 16 8 - 23 mg/dL   Creatinine, Ser  0.98 0.61 - 1.24 mg/dL   Calcium 9.5 8.9 - 10.3 mg/dL   Total Protein 7.9 6.5 - 8.1 g/dL   Albumin 4.3 3.5 - 5.0 g/dL   AST 21 15 - 41 U/L   ALT 14 0 - 44 U/L   Alkaline Phosphatase 84 38 - 126 U/L   Total Bilirubin 0.4 0.3 - 1.2 mg/dL   GFR calc non Af Amer >60 >60 mL/min   GFR calc Af Amer >60 >60 mL/min   Anion gap 8 5 - 15  CBC with Differential  Result Value Ref Range   WBC 5.4 4.0 - 10.5 K/uL   RBC 4.89 4.22 - 5.81 MIL/uL   Hemoglobin 15.7 13.0 - 17.0 g/dL   HCT 45.8 39.0 - 52.0 %   MCV 93.7 80.0 - 100.0 fL   MCH 32.1 26.0 - 34.0 pg   MCHC  34.3 30.0 - 36.0 g/dL   RDW 13.2 11.5 - 15.5 %   Platelets 213 150 - 400 K/uL   nRBC 0.0 0.0 - 0.2 %   Neutrophils Relative % 46 %   Neutro Abs 2.5 1.7 - 7.7 K/uL   Lymphocytes Relative 37 %   Lymphs Abs 2.0 0.7 - 4.0 K/uL   Monocytes Relative 10 %   Monocytes Absolute 0.5 0.1 - 1.0 K/uL   Eosinophils Relative 7 %   Eosinophils Absolute 0.4 0.0 - 0.5 K/uL   Basophils Relative 0 %   Basophils Absolute 0.0 0.0 - 0.1 K/uL   Immature Granulocytes 0 %   Abs Immature Granulocytes 0.01 0.00 - 0.07 K/uL  Troponin I (High Sensitivity)  Result Value Ref Range   Troponin I (High Sensitivity) 4 <18 ng/L  Troponin I (High Sensitivity)  Result Value Ref Range   Troponin I (High Sensitivity) 6 <18 ng/L   Laboratory interpretation all normal including delta troponin    EKG EKG Interpretation  Date/Time:  Monday August 03 2019 00:36:23 EDT Ventricular Rate:  83 PR Interval:    QRS Duration: 105 QT Interval:  378 QTC Calculation: 445 R Axis:   76 Text Interpretation:  Atrial fibrillation Since last tracing rate faster 05 Feb 2019 Since last tracing Atrial fibrillation has replaced Normal sinus rhythm Reconfirmed by Rolland Porter [phone]) on 08/03/2019 12:53:11 AM   Radiology Dg Chest Port 1 View  Result Date: 08/03/2019 CLINICAL DATA:  Chest pain. EXAM: PORTABLE CHEST 1 VIEW COMPARISON:  Chest CT 06/11/2019, radiograph 02/05/2019 FINDINGS: The cardiomediastinal contours are unchanged. Heart size upper normal pulmonary vasculature is normal. No consolidation, pleural effusion, or pneumothorax. No acute osseous abnormalities are seen. Chronic widening of right acromioclavicular joint. IMPRESSION: No acute chest findings. Electronically Signed   By: Keith Rake M.D.   On: 08/03/2019 01:11    Procedures Procedures (including critical care time)  Medications Ordered in ED Medications  nitroGLYCERIN (NITROGLYN) 2 % ointment 1 inch (1 inch Topical Given 08/03/19 0141)     Initial  Impression / Assessment and Plan / ED Course  I have reviewed the triage vital signs and the nursing notes.  Pertinent labs & imaging results that were available during my care of the patient were reviewed by me and considered in my medical decision making (see chart for details).   Review of Dr. Nelly Laurence note from May 20 if shows patient has coronary artery disease and he was admitted June 2018 with chest pain at which time cardiac cath showed a 95% mid LAD lesion that was stented.  He also had  a 50% mid RCA lesion, 25% ostial RCA lesion, 25% left main lesion and a 40% second diagonal lesion.  His estimated left ventricular ejection fraction was 55 to 65%.  Echocardiogram done on June 2018 showed ejection fraction 60 to 65% with normal wall motion without regional wall motion abnormalities.  He also discusses patient was diagnosed of atrial fib in March 2018.  He was started on Eliquis and Lopressor.  Patient had a lot of complaints of dyspnea and they tried multiple cardioversions and started him on flecainide.  After he was diagnosed with coronary artery disease they stopped his flecainide.  He had also been placed on Lopressor however he did have heart rates that got into the 40s and 50s.  Patient had nitroglycerin paste placed on his chest.  Laboratory testing was done.  Recheck patient at 2:25 AM patient is sleeping, he states his pain is gone.  We discussed getting his delta troponin and I am going to figure out his heart score to determine if he could be go home or be admitted.  4:14 AM patient discussed with Dr. Hassell Done, cardiology fellow on-call.  She states with his negative troponins that his chest pain is most likely from his rate controlled atrial fibrillation.  He is already on Eliquis.  She feels like he can follow-up in the office.  Patient states he takes his Eliquis as prescribed.  He was very happy to go home and follow-up with Dr. Harl Bowie.  Final Clinical Impressions(s) / ED  Diagnoses   Final diagnoses:  Chest pain, unspecified type  Paroxysmal atrial fibrillation [facility])    ED Discharge Orders         Ordered    nitroGLYCERIN (NITROSTAT) 0.4 MG SL tablet  Every 5 min PRN     08/03/19 0418          Plan discharge  Rolland Porter, MD, Barbette Or, MD 08/03/19 [phone]

## 2019-08-03 NOTE — Discharge Instructions (Addendum)
Please call Dr. Nelly Laurence office today to let them know about your ED visit.  You are back in atrial fibrillation however the metoprolol that you take is controlling your heart rate.  Please make sure you take your Eliquis every day.  Return to the emergency department if you get severe chest pain again that lasts more than 20 to 30 minutes.  You can try the nitroglycerin under the tongue for chest pain however if you take it you should proceed to the ED.

## 2019-08-21 ENCOUNTER — Telehealth (INDEPENDENT_AMBULATORY_CARE_PROVIDER_SITE_OTHER): Payer: Medicare Other | Admitting: Cardiology

## 2019-08-21 ENCOUNTER — Encounter: Payer: Self-pay | Admitting: Cardiology

## 2019-08-21 ENCOUNTER — Other Ambulatory Visit: Payer: Self-pay

## 2019-08-21 VITALS — BP 93/54 | HR 64 | Ht 72.0 in | Wt 181.0 lb

## 2019-08-21 DIAGNOSIS — I4891 Unspecified atrial fibrillation: Secondary | ICD-10-CM | POA: Diagnosis not present

## 2019-08-21 DIAGNOSIS — I2510 Atherosclerotic heart disease of native coronary artery without angina pectoris: Secondary | ICD-10-CM

## 2019-08-21 DIAGNOSIS — R0789 Other chest pain: Secondary | ICD-10-CM

## 2019-08-21 NOTE — Patient Instructions (Signed)

## 2019-08-21 NOTE — Progress Notes (Signed)
Virtual Visit via Telephone Note   This visit type was conducted due to national recommendations for restrictions regarding the COVID-19 Pandemic (e.g. social distancing) in an effort to limit this patient's exposure and mitigate transmission in our community.  Due to his co-morbid illnesses, this patient is at least at moderate risk for complications without adequate follow up.  This format is felt to be most appropriate for this patient at this time.  The patient did not have access to video technology/had technical difficulties with video requiring transitioning to audio format only (telephone).  All issues noted in this document were discussed and addressed.  No physical exam could be performed with this format.  Please refer to the patient's chart for his  consent to telehealth for [facility].   Date:  08/21/2019   ID:  [name], DOB 1930-09-15, MRN [id number]  Patient Location: Home Provider Location: Office  PCP:  Billie Ruddy, MD  Cardiologist:  Carlyle Dolly, MD  Electrophysiologist:  None   Evaluation Performed:  Follow-Up Visit  Chief Complaint:  ER follow up  History of Present Illness:    Sean Miranda is a 85 y.o. male seen today for a focused visit on his history of CAD with recent ER visit for chest pain and also seen for his history of afib  1. CAD - admit 05/2017 with chest pain - 05/2017 cath as reported below, received a DES to LAD - 05/2017 echo LVEF 60-65%, no WMAs  - 02/2019 ER visit with chest pain - atypical symptoms. Right sided worst with inspiration. Recent URI symptoms.  - 02/2019 CT PE negative though did show nodular densities RUL, recs to repeat CT 3 months (followed by Dr Luan Pulling)   08/03/19 ER visit with chest pain. CXR no acute process. EKG afib no ischemic changes - hstrop neg x 2 - pain had started while in bed sleeping. Pressure left sided, 8/10 in severity. No other symptoms. Not positional. Lasted about 1 hour. Self resolved  - no recurrent pain - remains physically active. Gets on treadmill 3 times a week. Does a brisk walk x 0.5 miles, no exertional symptoms.      2. Afib - appears diagnosed around 01/2017 during admission. Started on eliquis and lopressor 12.5mg  bid. Due to continued dyspnea attempts were made to get him back into NSR, including multiple cardioversions and starting flecanide. Had tolerated lopressor at the time but stopped taking due to HRs in 40s or 50s, had been on 12.5mg  bid at the time.  - was on flecanide previously, discontued after diagnosed with CAD   - denies any palpitations. Checks HRs at home, typically around 81s.  - compliant with meds. We tried lopressor 12.5mg  bid last visit but caused fatigue and he stopped taking. Heart rates checked multiple times a day, usually 50s-60s.       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    The patient does not have symptoms concerning for COVID-19 infection (fever, chills, cough, or new shortness of breath).    Past Medical History:  Diagnosis Date  . Arthritis    neck   . Atrial fibrillation ([place])    a. new-onset in 01/2017. Started on Eliquis  . CAD (coronary artery disease), native coronary artery 05/16/2017   LAD 95%>>0 w/ 2.5 x 16 mm Synergy DES, D2 40%, RCA 50%, EF 55-65%, LVEDP mod elevated  . Calcification of coronary  artery    a. mild, nonobstructive CAD by cath in 2009.  Marland Kitchen Cancer of skin of chest   . Carotid artery occlusion    a. s/p R CEA in 2014  . Colon polyps   . Hyperlipidemia    takes Pravastatin every other day  . Pneumonia ~ 01/2013   Past Surgical History:  Procedure Laterality Date  . CARDIAC CATHETERIZATION  06/09/1999   mild CAD (LAD, diagonal, RCA) - Dr. Loni Muse. [place]  . CARDIOVERSION N/A 03/25/2017   Procedure: CARDIOVERSION;  Surgeon: Pixie Casino, MD;  Location: [facility] ENDOSCOPY;  Service: Cardiovascular;  Laterality: N/A;  .  CAROTID DOPPLER  02/2012   60-79% RICA stenosis, 123456 LICA stenosis (prior to carotid endarterectomy)  . COLONOSCOPY    . CORONARY STENT INTERVENTION N/A 05/16/2017   Procedure: Coronary Stent Intervention;  Surgeon: Jettie Booze, MD;  Location: [place] CV LAB;  Service: Cardiovascular;  Laterality: N/A;  . ENDARTERECTOMY Right 11/10/2013   Procedure: ENDARTERECTOMY CAROTID;  Surgeon: Conrad Reedsport, MD;  Location: [place];  Service: Vascular;  Laterality: Right;  . INGUINAL HERNIA REPAIR Left   . LEFT HEART CATH AND CORONARY ANGIOGRAPHY N/A 05/16/2017   Procedure: Left Heart Cath and Coronary Angiography;  Surgeon: Jettie Booze, MD;  Location: [place] CV LAB;  Service: Cardiovascular;  Laterality: N/A;  . MOLE REMOVAL  1990s   "chest"  . NM MYOCAR PERF WALL MOTION  04/2011   bruce myoview - perfusion defect in inferior myocardium (diaphragmatic attenuation), remaining myocardium with normal perfusion, EF 67%  . PATCH ANGIOPLASTY Right 11/10/2013   Procedure: PATCH ANGIOPLASTY;  Surgeon: Conrad [place], MD;  Location: [place];  Service: Vascular;  Laterality: Right;  . PILONIDAL CYST DRAINAGE    . SHOULDER OPEN ROTATOR CUFF REPAIR Bilateral 1998 - 2001  . SKIN CANCER EXCISION  ~ 2015   "chest"  . TONSILLECTOMY       No outpatient medications have been marked as taking for the 08/21/19 encounter (Appointment) with Arnoldo Lenis, MD.     Allergies:   Lipitor [atorvastatin calcium] and Vantin [cefpodoxime]   Social History   Tobacco Use  . Smoking status: Former Smoker    Packs/day: 1.00    Years: 18.00    Pack years: 18.00    Types: Cigarettes    Quit date: 12/03/1972    Years since quitting: 46.7  . Smokeless tobacco: Never Used  Substance Use Topics  . Alcohol use: Yes    Alcohol/week: 14.0 standard drinks    Types: 7 Glasses of wine, 7 Shots of liquor per week  . Drug use: No     Family Hx: The patient's family history includes Cancer in his brother;  Hypertension in his mother; Prostate cancer in his son; Stroke in his brother and father.  ROS:   Please see the history of present illness.     All other systems reviewed and are negative.   Prior CV studies:   The following studies were reviewed today:  05/2017 cath   Mid RCA lesion, 50 %stenosed.  Ost RCA lesion, 25 %stenosed. Lesion noted in small RV marginal.  LM lesion, 25 %stenosed.  2nd Diag lesion, 40 %stenosed.  Mid LAD-1 lesion, 20 %stenosed.  Mid LAD-2 lesion, 95 %stenosed.  A STENT SYNERGY DES [id number] drug eluting stent was successfully placed, postdilated to 3.0 mm.  Post intervention, there is a 0% residual stenosis.  The left ventricular systolic function is normal.  LV end  diastolic pressure is moderately elevated.  The left ventricular ejection fraction is 55-65% by visual estimate.  There is no aortic valve stenosis.  LAD was the culprit for his unstable angina. He had symptoms during the procedure at rest. These resolved after stent placement. Watch overnight. Continue IV Angiomax for another hour.  Loaded with Plavix today. We'll start 75 mg daily tomorrow. Given aspirin today as well. Would plan for aspirin, Plavix and Eliquis for 30 days. After 30 days, would stop the aspirin and continue Plavix and Eliquis. A synergy drug-eluting stent was used since he is on long-term anticoagulation. If there is a bleeding complication, his antiplatelet therapy could be stopped sooner than usual. Ideally, he would get 6-12 months of Plavix depending on his bleeding risk profile.  Possible discharge tomorrow if he does well tonight and with cardiac rehabilitation tomorrow.   05/2017 echo Study Conclusions  - Left ventricle: The cavity size was normal. Wall thickness was increased in a pattern of mild LVH. Systolic function was normal. The estimated ejection fraction was in the range of 60% to 65%. Wall motion was normal; there were no regional wall  motion abnormalities. The study is not technically sufficient to allow evaluation of LV diastolic function. - Aortic valve: Mildly calcified annulus. Trileaflet; moderately thickened leaflets. Valve area (VTI): 2.98 cm^2. Valve area (Vmax): 2.7 cm^2. Valve area (Vmean): 2.56 cm^2. - Left atrium: The atrium was mildly dilated. - Technically adequate study.  Labs/Other Tests and Data Reviewed:    EKG:  No ECG reviewed.  Recent Labs: 08/03/2019: ALT 14; BUN 16; Creatinine, Ser 0.98; Hemoglobin 15.7; Platelets 213; Potassium 3.9; Sodium 137   Recent Lipid Panel Lab Results  Component Value Date/Time   CHOL 203 (H) 05/06/2019 08:28 AM   TRIG 67.0 05/06/2019 08:28 AM   HDL 48.40 05/06/2019 08:28 AM   CHOLHDL 4 05/06/2019 08:28 AM   LDLCALC 141 (H) 05/06/2019 08:28 AM   LDLDIRECT 149.3 01/05/2014 09:40 AM    Wt Readings from Last 3 Encounters:  08/03/19 180 lb (81.6 kg)  05/06/19 189 lb (85.7 kg)  04/22/19 190 lb (86.2 kg)     Objective:    Vital Signs:   Today's Vitals   08/21/19 1224  BP: (!) 93/54  Pulse: 64  Weight: 181 lb (82.1 kg)  Height: 6' (1.829 m)   Body mass index is 24.55 kg/m. Normal affect. Normal speech pattern and tone. Comfortable, no apparent distress. No audible signs of SOB or wheezing.   ASSESSMENT & PLAN:    1. CAD - recent ER visit with isolated chest pain episode, no recurrent symptoms. Negative workup for ACS during that visit. REmains very active, walks on treadmill regularly without symptoms - continue to monitor at this time.   2. Afib - history of PAF, multiple failed cardioversions. Flecanide stopped when CAD was diagnosed after his cath. Has not tolerated even low dose lopressor.  - HRs reasonable without therapy currently, continue to monitor.      COVID-19 Education: The signs and symptoms of COVID-19 were discussed with the patient and how to seek care for testing (follow up with PCP or arrange E-visit).  The importance  of social distancing was discussed today.  Time:   Today, I have spent 20 minutes with the patient with telehealth technology discussing the above problems.     Medication Adjustments/Labs and Tests Ordered: Current medicines are reviewed at length with the patient today.  Concerns regarding medicines are outlined above.   Tests Ordered:  No orders of the defined types were placed in this encounter.   Medication Changes: No orders of the defined types were placed in this encounter.   Follow Up:  In Person in 6 month(s)  Signed, Carlyle Dolly, MD  08/21/2019 11:38 AM    [place]

## 2019-08-27 ENCOUNTER — Ambulatory Visit: Payer: Medicare Other | Admitting: Cardiology

## 2019-09-01 ENCOUNTER — Other Ambulatory Visit: Payer: Self-pay | Admitting: Internal Medicine

## 2019-10-07 DIAGNOSIS — Z0000 Encounter for general adult medical examination without abnormal findings: Secondary | ICD-10-CM | POA: Diagnosis not present

## 2019-10-07 DIAGNOSIS — I10 Essential (primary) hypertension: Secondary | ICD-10-CM | POA: Diagnosis not present

## 2019-10-07 DIAGNOSIS — E785 Hyperlipidemia, unspecified: Secondary | ICD-10-CM | POA: Diagnosis not present

## 2019-10-07 DIAGNOSIS — I739 Peripheral vascular disease, unspecified: Secondary | ICD-10-CM | POA: Diagnosis not present

## 2019-10-09 LAB — LIPID PANEL
Cholesterol: 243
HDL Cholesterol: 44 (ref 35–70)
Homocysteine: 12.6
LDL Cholesterol: 179
Non HDL Cholesterol: 199
Total CHOL/HDL Ratio: 5.5
Triglycerides: 86 (ref 40–160)

## 2019-10-09 LAB — CBC
CRP: 4.8
HCT: 44 — AB (ref 29–41)
Hemoglobin: 14.9
MCH: 32.5
MCHC: 34.3
MCV: 94.8 (ref 76–111)
MPV: 9.5 fL (ref 7.5–11.5)
RBC: 4.59
RDW: 12.7
WBC: 4.6
platelet count: 242

## 2019-10-09 LAB — COMPREHENSIVE METABOLIC PANEL
ALBUMIN/GLOBULIN RATIO: 1.5
ALT: 12 (ref 3–30)
AST: 17
Albumin: 4.3
Alkaline Phosphatase: 61
BUN: 12 (ref 4–21)
Carbon Dioxide, Total: 28
Chloride: 103
Creat: 0.98
EGFR (African American): 80
EGFR (Non-African Amer.): 69
Globulin: 2.8
Glucose: 92
Potassium: 4.5
Sodium: 138
Total Bilirubin: 0.6
Total Protein: 7.1 (ref 6.4–8.2)

## 2019-10-13 ENCOUNTER — Other Ambulatory Visit: Payer: Self-pay

## 2019-10-13 ENCOUNTER — Emergency Department (HOSPITAL_COMMUNITY): Payer: Medicare Other

## 2019-10-13 ENCOUNTER — Emergency Department (HOSPITAL_COMMUNITY)
Admission: EM | Admit: 2019-10-13 | Discharge: 2019-10-13 | Disposition: A | Discharge status: Home / Self Care | Payer: Medicare Other | Attending: Emergency Medicine | Admitting: Emergency Medicine

## 2019-10-13 ENCOUNTER — Other Ambulatory Visit: Payer: Self-pay | Admitting: Internal Medicine

## 2019-10-13 ENCOUNTER — Encounter (HOSPITAL_COMMUNITY): Payer: Self-pay | Admitting: *Deleted

## 2019-10-13 ENCOUNTER — Telehealth: Payer: Self-pay | Admitting: *Deleted

## 2019-10-13 DIAGNOSIS — I10 Essential (primary) hypertension: Secondary | ICD-10-CM | POA: Diagnosis not present

## 2019-10-13 DIAGNOSIS — R0789 Other chest pain: Secondary | ICD-10-CM | POA: Diagnosis present

## 2019-10-13 DIAGNOSIS — Z87891 Personal history of nicotine dependence: Secondary | ICD-10-CM | POA: Insufficient documentation

## 2019-10-13 DIAGNOSIS — R072 Precordial pain: Secondary | ICD-10-CM | POA: Insufficient documentation

## 2019-10-13 DIAGNOSIS — Z79899 Other long term (current) drug therapy: Secondary | ICD-10-CM | POA: Diagnosis not present

## 2019-10-13 DIAGNOSIS — I2510 Atherosclerotic heart disease of native coronary artery without angina pectoris: Secondary | ICD-10-CM | POA: Insufficient documentation

## 2019-10-13 DIAGNOSIS — Z85828 Personal history of other malignant neoplasm of skin: Secondary | ICD-10-CM | POA: Diagnosis not present

## 2019-10-13 DIAGNOSIS — R079 Chest pain, unspecified: Secondary | ICD-10-CM | POA: Diagnosis not present

## 2019-10-13 DIAGNOSIS — Z7901 Long term (current) use of anticoagulants: Secondary | ICD-10-CM | POA: Insufficient documentation

## 2019-10-13 LAB — CBC WITH DIFFERENTIAL/PLATELET
Abs Immature Granulocytes: 0 10*3/uL (ref 0.00–0.07)
Basophils Absolute: 0 10*3/uL (ref 0.0–0.1)
Basophils Relative: 0 %
Eosinophils Absolute: 0.3 10*3/uL (ref 0.0–0.5)
Eosinophils Relative: 6 %
HCT: 41.8 % (ref 39.0–52.0)
Hemoglobin: 14.3 g/dL (ref 13.0–17.0)
Immature Granulocytes: 0 %
Lymphocytes Relative: 40 %
Lymphs Abs: 2 10*3/uL (ref 0.7–4.0)
MCH: 32.1 pg (ref 26.0–34.0)
MCHC: 34.2 g/dL (ref 30.0–36.0)
MCV: 93.7 fL (ref 80.0–100.0)
Monocytes Absolute: 0.5 10*3/uL (ref 0.1–1.0)
Monocytes Relative: 10 %
Neutro Abs: 2.1 10*3/uL (ref 1.7–7.7)
Neutrophils Relative %: 44 %
Platelets: 208 10*3/uL (ref 150–400)
RBC: 4.46 MIL/uL (ref 4.22–5.81)
RDW: 12.6 % (ref 11.5–15.5)
WBC: 4.9 10*3/uL (ref 4.0–10.5)
nRBC: 0 % (ref 0.0–0.2)

## 2019-10-13 LAB — BASIC METABOLIC PANEL
Anion gap: 13 (ref 5–15)
BUN: 17 mg/dL (ref 8–23)
CO2: 20 mmol/L — ABNORMAL LOW (ref 22–32)
Calcium: 9.2 mg/dL (ref 8.9–10.3)
Chloride: 105 mmol/L (ref 98–111)
Creatinine, Ser: 1.16 mg/dL (ref 0.61–1.24)
GFR calc Af Amer: >60 mL/min (ref >60)
GFR calc non Af Amer: 56 mL/min — ABNORMAL LOW (ref >60)
Glucose, Bld: 92 mg/dL (ref 70–99)
Potassium: 4 mmol/L (ref 3.5–5.1)
Sodium: 138 mmol/L (ref 135–145)

## 2019-10-13 LAB — TROPONIN I (HIGH SENSITIVITY): Troponin I (High Sensitivity): 6 ng/L (ref <18)

## 2019-10-13 MED ORDER — NITROGLYCERIN 0.4 MG SL SUBL
0.4000 mg | SUBLINGUAL_TABLET | SUBLINGUAL | Status: DC | PRN
  Administered 2019-10-13: 0.4 mg via SUBLINGUAL
  Filled 2019-10-13: qty 1

## 2019-10-13 MED ORDER — NITROGLYCERIN 0.4 MG SL SUBL: 0.4000 mg | SUBLINGUAL_TABLET | SUBLINGUAL | 0 refills | Status: DC | PRN

## 2019-10-13 NOTE — Telephone Encounter (Signed)
Call to check pt status after ER visit. Pt states that he is feeling fine. Pt denies chest pain and SOB at this time. Pt encouraged to call office or be seen in ER if symptoms change. Pt to be seen on Fri. At 2:20 pm.

## 2019-10-13 NOTE — ED Provider Notes (Signed)
[facility] EMERGENCY DEPARTMENT Provider Note   CSN: [id number] Arrival date & time: 10/13/19  0220     History   Chief Complaint Chief Complaint  Patient presents with  . Chest Pain    HPI AJAN CUTRER is a 85 y.o. male.  HPI: A 85 year old patient with a history of hypercholesterolemia presents for evaluation of chest pain. Initial onset of pain was approximately 1-3 hours ago. The patient's chest pain is described as heaviness/pressure/tightness and is not worse with exertion. The patient's chest pain is middle- or left-sided, is not well-localized, is not sharp and does not radiate to the arms/jaw/neck. The patient does not complain of nausea and denies diaphoresis. The patient has no history of stroke, has no history of peripheral artery disease, has not smoked in the past 90 days, denies any history of treated diabetes, has no relevant family history of coronary artery disease (first degree relative at less than age 31), is not hypertensive and does not have an elevated BMI (>=30).   HPI Patient with known history of CAD presents with chest pain.  He reports getting up to go to the restroom when he began having chest pressure.  No shortness of breath or diaphoresis.  No vomiting.  He did feel mild dizziness.  He took 2 nitroglycerin with minimal relief. He is very active and has not had any recent chest pain.  He was feeling well prior to going to bed.  No recent fevers or vomiting. Past Medical History:  Diagnosis Date  . Arthritis    neck   . Atrial fibrillation ([place])    a. new-onset in 01/2017. Started on Eliquis  . CAD (coronary artery disease), native coronary artery 05/16/2017   LAD 95%>>0 w/ 2.5 x 16 mm Synergy DES, D2 40%, RCA 50%, EF 55-65%, LVEDP mod elevated  . Calcification of coronary artery    a. mild, nonobstructive CAD by cath in 2009.  Marland Kitchen Cancer of skin of chest   . Carotid artery occlusion    a. s/p R CEA in 2014  . Colon polyps   . Hyperlipidemia    takes Pravastatin every other day  . Pneumonia ~ 01/2013    Patient Active Problem List   Diagnosis Date Noted  . History of right-sided carotid endarterectomy 04/10/2018  . Cancer of skin of chest   . Arthritis   . S/P primary angioplasty with coronary stent 06/12/2017  . Blood clotting disorder ([place]) 04/16/2017  . Atrial fibrillation ([place]) 02/21/2017  . 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 Essential hypertension 05/27/2014  . Occlusion and stenosis of carotid artery without mention of cerebral infarction 11/06/2013  . 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 Coronary atherosclerosis 11/03/2010  . Carotid artery disease ([place]) 12/26/2009  . Hyperlipidemia 08/12/2008  . COLONIC POLYPS, HX OF 06/03/2007    Past Surgical History:  Procedure Laterality Date  . CARDIAC CATHETERIZATION  06/09/1999   mild CAD (LAD, diagonal, RCA) - Dr. Loni Muse. [place]  . CARDIOVERSION N/A 03/25/2017   Procedure: CARDIOVERSION;  Surgeon: Pixie Casino, MD;  Location: [facility] ENDOSCOPY;  Service: Cardiovascular;  Laterality: N/A;  . CAROTID DOPPLER  02/2012   60-79% RICA stenosis, 123456 LICA stenosis (prior to carotid endarterectomy)  . COLONOSCOPY    . CORONARY STENT INTERVENTION N/A 05/16/2017   Procedure: Coronary Stent Intervention;  Surgeon: Jettie Booze, MD;  Location: [place] CV LAB;  Service: Cardiovascular;  Laterality: N/A;  . ENDARTERECTOMY Right 11/10/2013   Procedure: ENDARTERECTOMY CAROTID;  Surgeon: Conrad [place], MD;  Location: [place];  Service: Vascular;  Laterality: Right;  . INGUINAL HERNIA REPAIR Left   . LEFT HEART CATH AND CORONARY ANGIOGRAPHY N/A 05/16/2017   Procedure: Left Heart Cath and Coronary Angiography;  Surgeon: Jettie Booze, MD;  Location: [place] CV LAB;  Service: Cardiovascular;  Laterality: N/A;  . MOLE REMOVAL  1990s   "chest"  . NM MYOCAR PERF WALL MOTION  04/2011   bruce myoview - perfusion defect in inferior myocardium (diaphragmatic attenuation),  remaining myocardium with normal perfusion, EF 67%  . PATCH ANGIOPLASTY Right 11/10/2013   Procedure: PATCH ANGIOPLASTY;  Surgeon: Conrad Riverton, MD;  Location: [place];  Service: Vascular;  Laterality: Right;  . PILONIDAL CYST DRAINAGE    . SHOULDER OPEN ROTATOR CUFF REPAIR Bilateral 1998 - 2001  . SKIN CANCER EXCISION  ~ 2015   "chest"  . TONSILLECTOMY          Home Medications    Prior to Admission medications   Medication Sig Start Date End Date Taking? Authorizing Provider  ALPHA LIPOIC ACID PO Take 300 mg by mouth daily.    [provider]  Cholecalciferol (VITAMIN D3) 2000 UNITS capsule Take 2,000 Units by mouth daily.     [provider]  Coenzyme Q10-Levocarnitine 100-20 MG CAPS Take 1 capsule by mouth daily.     [provider]  ELIQUIS 5 MG TABS tablet TAKE (1) TABLET BY MOUTH TWICE DAILY. 09/01/19   Hilty, Nadean Corwin, MD  ezetimibe (ZETIA) 10 MG tablet Take 1 tablet (10 mg total) by mouth daily. 12/12/18 08/21/19  Arnoldo Lenis, MD  Glucosamine-Chondroitin 500-400 MG CAPS Take 1 capsule by mouth daily.     [provider]  Magnesium 500 MG TABS Take 1 tablet by mouth daily.    [provider]  Multiple Vitamin (MULTIVITAMIN) tablet Take 0.5 tablets by mouth daily.     [provider]  nitroGLYCERIN (NITROSTAT) 0.4 MG SL tablet Place 1 tablet (0.4 mg total) under the tongue every 5 (five) minutes as needed for chest pain. 08/03/19   Rolland Porter, MD  OMEGA-3 1000 MG CAPS Take 4,000 mg by mouth daily.    [provider]  Probiotic Product (PROBIOTIC DAILY PO) Take 1 capsule by mouth daily.    [provider]  sildenafil (REVATIO) 20 MG tablet TAKE 2 TO 3 TABLETS BY MOUTH AS NEEDED FOR SEXUAL ACTIVITY 02/07/18   Marletta Lor, MD    Family History Family History  Problem Relation Age of Onset  . Stroke Father   . Hypertension Mother   . Prostate cancer Son   . Stroke Brother   . Cancer Brother         spine    Social History Social History   Tobacco Use  . Smoking status: Former Smoker    Packs/day: 1.00    Years: 18.00    Pack years: 18.00    Types: Cigarettes    Quit date: 12/03/1972    Years since quitting: 46.8  . Smokeless tobacco: Never Used  Substance Use Topics  . Alcohol use: Yes    Alcohol/week: 14.0 standard drinks    Types: 7 Glasses of wine, 7 Shots of liquor per week  . Drug use: No     Allergies   Lipitor [atorvastatin calcium] and Vantin [cefpodoxime]   Review of Systems Review of Systems  Constitutional: Negative for diaphoresis and fever.  Respiratory: Negative for shortness of  breath.   Cardiovascular: Positive for chest pain.  Gastrointestinal: Negative for blood in stool, diarrhea and vomiting.  Neurological: Positive for dizziness.  All other systems reviewed and are negative.    Physical Exam Updated Vital Signs BP (!) 105/59   Pulse 60   Temp 97.6 F (36.4 C) (Oral)   Resp 17   Ht 1.829 m (6')   Wt 81.2 kg   SpO2 93%   BMI 24.28 kg/m   Physical Exam CONSTITUTIONAL: Elderly but appears younger than stated age HEAD: Normocephalic/atraumatic EYES: EOMI/PERRL ENMT: Mucous membranes moist NECK: supple no meningeal signs SPINE/BACK:entire spine nontender CV: Irregular, no loud murmurs LUNGS: Lungs are clear to auscultation bilaterally, no apparent distress ABDOMEN: soft, nontender, no rebound or guarding, bowel sounds noted throughout abdomen GU:no cva tenderness NEURO: Pt is awake/alert/appropriate, moves all extremitiesx4.  No facial droop.   EXTREMITIES: pulses normal/equal, full ROM SKIN: warm, color normal PSYCH: no abnormalities of mood noted, alert and oriented to situation   ED Treatments / Results  Labs (all labs ordered are listed, but only abnormal results are displayed) Labs Reviewed  BASIC METABOLIC PANEL - Abnormal; Notable for the following components:      Result Value   CO2 20 (*)    GFR calc non Af Amer  56 (*)    All other components within normal limits  CBC WITH DIFFERENTIAL/PLATELET  TROPONIN I (HIGH SENSITIVITY)    EKG EKG Interpretation  Date/Time:  Tuesday October 13 2019 02:43:23 EST Ventricular Rate:  96 PR Interval:    QRS Duration: 97 QT Interval:  390 QTC Calculation: 493 R Axis:   73 Text Interpretation: Atrial fibrillation Borderline prolonged QT interval Confirmed by Ripley Fraise [id number]) on 10/13/2019 2:45:31 AM   Radiology Dg Chest 2 View  Result Date: 10/13/2019 CLINICAL DATA:  Chest pain for 2 hours EXAM: CHEST - 2 VIEW COMPARISON:  08/03/2019 FINDINGS: Cardiac shadows within normal limits. The lungs are well aerated bilaterally. No focal infiltrate or sizable effusion is seen. Mild bibasilar scarring is noted. No bony abnormality is seen. IMPRESSION: No acute abnormality noted. Electronically Signed   By: Inez Catalina M.D.   On: 10/13/2019 03:12    Procedures Procedures  Medications Ordered in ED Medications  nitroGLYCERIN (NITROSTAT) SL tablet 0.4 mg (0.4 mg Sublingual Given 10/13/19 0244)     Initial Impression / Assessment and Plan / ED Course  I have reviewed the triage vital signs and the nursing notes.  Pertinent labs & imaging results that were available during my care of the patient were reviewed by me and considered in my medical decision making (see chart for details).     HEAR Score: 6  3:26 AM Patient with known history of CAD presents with chest pain He is now feeling improved but does still have pressure.  Nitroglycerin will be given.  Has a history of atrial fibrillation, he is in A. fib at this time but is rate controlled.  He takes Eliquis daily without any recent missed doses Chest pain work-up initiated. 4:52 AM Patient sleeping on reassessment.  He reports all of his pain is resolved. He prefers to be discharged home.  Fortunately his initial troponin is unremarkable.  However I did inform him that I cannot rule out ACS  with 1 troponin.  He understands this, but still elects to go home.  Wife at bedside is also in agreement He requests a new prescription for nitroglycerin.  He was instructed to call his cardiologist later today  for close follow-up.  He had a televisit about 2 months ago, but no in- person visit since May  Patient otherwise well-appearing.  No acute ST changes.  Low suspicion for PE or dissection at this time.  Patient appears very comfortable at this time  We discussed strict ER return precautions Final Clinical Impressions(s) / ED Diagnoses   Final diagnoses:  Precordial pain    ED Discharge Orders         Ordered    nitroGLYCERIN (NITROSTAT) 0.4 MG SL tablet  Every 5 min PRN     10/13/19 0433           Ripley Fraise, MD 10/13/19 [phone]

## 2019-10-13 NOTE — ED Triage Notes (Signed)
Pt c/o chest pain that started around 1am tonight after getting up to go to the bathroom; pt states the pain is to the left side of his chest and does not radiate; pt took 2 nitro with no relief

## 2019-10-14 NOTE — Telephone Encounter (Signed)
Please review for refill. Thanks!  

## 2019-10-21 ENCOUNTER — Encounter: Payer: Self-pay | Admitting: Cardiology

## 2019-10-21 ENCOUNTER — Ambulatory Visit: Payer: Medicare Other | Admitting: Cardiology

## 2019-10-21 ENCOUNTER — Other Ambulatory Visit: Payer: Self-pay

## 2019-10-21 VITALS — BP 143/61 | HR 59 | Temp 96.2°F | Ht 72.0 in | Wt 188.0 lb

## 2019-10-21 DIAGNOSIS — I2510 Atherosclerotic heart disease of native coronary artery without angina pectoris: Secondary | ICD-10-CM | POA: Diagnosis not present

## 2019-10-21 DIAGNOSIS — R079 Chest pain, unspecified: Secondary | ICD-10-CM | POA: Diagnosis not present

## 2019-10-21 NOTE — Patient Instructions (Addendum)
Medication Instructions:  Your physician recommends that you continue on your current medications as directed. Please refer to the Current Medication list given to you today.   Labwork: NONE  Testing/Procedures: Your physician has requested that you have en exercise stress myoview. For further information please visit [email or web address]. Please follow instruction sheet, as given.    Follow-Up: Your physician recommends that you schedule a follow-up appointment in: 3 MONTHS     Any Other Special Instructions Will Be Listed Below (If Applicable).     If you need a refill on your cardiac medications before your next appointment, please call your pharmacy.

## 2019-10-21 NOTE — Progress Notes (Signed)
Clinical Summary [name] is a 85 y.o.male seen today for a focused visit on recent episode of chest pain and ER visit.   1. CAD - admit 05/2017 with chest pain - 05/2017 cath as reported below, received a DES to LAD - 05/2017 echo LVEF 60-65%, no WMAs  -02/2019 ER visit with chest pain - atypical symptoms. Right sided worst with inspiration. Recent URI symptoms.  - 02/2019 CT PE negative though did show nodular densities RUL, recs to repeat CT 3 months (followed by Dr Sean Miranda)   08/03/19 ER visit with chest pain. CXR no acute process. EKG afib no ischemic changes - hstrop neg x 2 - pain had started while in bed sleeping. Pressure left sided, 8/10 in severity. No other symptoms. Not positional. Lasted about 1 hour. Self resolved - no recurrent pain - remains physically active. Gets on treadmill 3 times a week. Does a brisk walk x 0.5 miles, no exertional symptoms.    - seen in ER 10/2019 with chest pain - trop neg x 1. EKG afib without acute ischemic changes. CXR no acute process  - pain awoke from sleep. Pressure left sided. 8/10 in severity. No other associated symptoms. Not positional. Lasted 1 hour at home, then came hospital. Pain went away on its own. Constant in total 2 to 2.5 hours. - no recurrent symtoms - back to regular habits, does treadmill x 1/2 mile at brisk walk without troubles.       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 Son in law is Sean Miranda    Past Medical History:  Diagnosis Date  . Arthritis    neck   . Atrial fibrillation ([place])    a. new-onset in 01/2017. Started on Eliquis  . CAD (coronary artery disease), native coronary artery 05/16/2017   LAD 95%>>0 w/ 2.5 x 16 mm Synergy DES, D2 40%, RCA 50%, EF 55-65%, LVEDP mod elevated  . Calcification of coronary artery    a. mild, nonobstructive CAD by cath in 2009.  Marland Kitchen Cancer of skin of chest   . Carotid artery  occlusion    a. s/p R CEA in 2014  . Colon polyps   . Hyperlipidemia    takes Pravastatin every other day  . Pneumonia ~ 01/2013     Allergies  Allergen Reactions  . Lipitor [Atorvastatin Calcium] Other (See Comments)    Muscle pain  . Vantin [Cefpodoxime] Rash     Current Outpatient Medications  Medication Sig Dispense Refill  . ALPHA LIPOIC ACID PO Take 300 mg by mouth daily.    . Cholecalciferol (VITAMIN D3) 2000 UNITS capsule Take 2,000 Units by mouth daily.     . Coenzyme Q10-Levocarnitine 100-20 MG CAPS Take 1 capsule by mouth daily.     Marland Kitchen ELIQUIS 5 MG TABS tablet TAKE (1) TABLET BY MOUTH TWICE DAILY. 60 tablet 0  . ezetimibe (ZETIA) 10 MG tablet Take 1 tablet (10 mg total) by mouth daily. 90 tablet 3  . Glucosamine-Chondroitin 500-400 MG CAPS Take 1 capsule by mouth daily.     . Magnesium 500 MG TABS Take 1 tablet by mouth daily.    . Multiple Vitamin (MULTIVITAMIN) tablet Take 0.5 tablets by mouth daily.     . nitroGLYCERIN (NITROSTAT) 0.4 MG SL tablet Place 1 tablet (0.4 mg total) under the tongue every 5 (five) minutes as needed for chest pain. 30 tablet 0  .  OMEGA-3 1000 MG CAPS Take 4,000 mg by mouth daily.    . pravastatin (PRAVACHOL) 40 MG tablet TAKE 1/2 TABLET BY MOUTH EVERY MONDAY, WEDNESDAY AND FRIDAY. 6 tablet 0  . Probiotic Product (PROBIOTIC DAILY PO) Take 1 capsule by mouth daily.    . sildenafil (REVATIO) 20 MG tablet TAKE 2 TO 3 TABLETS BY MOUTH AS NEEDED FOR SEXUAL ACTIVITY 60 tablet 4   No current facility-administered medications for this visit.      Past Surgical History:  Procedure Laterality Date  . CARDIAC CATHETERIZATION  06/09/1999   mild CAD (LAD, diagonal, RCA) - Dr. Loni Miranda. [facility]  . CARDIOVERSION N/A 03/25/2017   Procedure: CARDIOVERSION;  Surgeon: Sean Casino, MD;  Location: [facility] ENDOSCOPY;  Service: Cardiovascular;  Laterality: N/A;  . CAROTID DOPPLER  02/2012   60-79% RICA stenosis, 123456 LICA stenosis (prior to carotid endarterectomy)  .  COLONOSCOPY    . CORONARY STENT INTERVENTION N/A 05/16/2017   Procedure: Coronary Stent Intervention;  Surgeon: Sean Booze, MD;  Location: [place] CV LAB;  Service: Cardiovascular;  Laterality: N/A;  . ENDARTERECTOMY Right 11/10/2013   Procedure: ENDARTERECTOMY CAROTID;  Surgeon: Sean Cheyenne, MD;  Location: [place];  Service: Vascular;  Laterality: Right;  . INGUINAL HERNIA REPAIR Left   . LEFT HEART CATH AND CORONARY ANGIOGRAPHY N/A 05/16/2017   Procedure: Left Heart Cath and Coronary Angiography;  Surgeon: Sean Booze, MD;  Location: [place] CV LAB;  Service: Cardiovascular;  Laterality: N/A;  . MOLE REMOVAL  1990s   "chest"  . NM MYOCAR PERF WALL MOTION  04/2011   bruce myoview - perfusion defect in inferior myocardium (diaphragmatic attenuation), remaining myocardium with normal perfusion, EF 67%  . PATCH ANGIOPLASTY Right 11/10/2013   Procedure: PATCH ANGIOPLASTY;  Surgeon: Sean , MD;  Location: [place];  Service: Vascular;  Laterality: Right;  . PILONIDAL CYST DRAINAGE    . SHOULDER OPEN ROTATOR CUFF REPAIR Bilateral 1998 - 2001  . SKIN CANCER EXCISION  ~ 2015   "chest"  . TONSILLECTOMY       Allergies  Allergen Reactions  . Lipitor [Atorvastatin Calcium] Other (See Comments)    Muscle pain  . Vantin [Cefpodoxime] Rash      Family History  Problem Relation Age of Onset  . Stroke Father   . Hypertension Mother   . Prostate cancer Son   . Stroke Brother   . Cancer Brother        spine     Social History [name] reports that he quit smoking about 46 years ago. His smoking use included cigarettes. He has a 18.00 pack-year smoking history. He has never used smokeless tobacco. [name] reports current alcohol use of about 14.0 standard drinks of alcohol per week.   Review of Systems CONSTITUTIONAL: No weight loss, fever, chills, weakness or fatigue.  HEENT: Eyes: No visual loss, blurred vision, double vision or yellow sclerae.No  hearing loss, sneezing, congestion, runny nose or sore throat.  SKIN: No rash or itching.  CARDIOVASCULAR: per hpi RESPIRATORY: No shortness of breath, cough or sputum.  GASTROINTESTINAL: No anorexia, nausea, vomiting or diarrhea. No abdominal pain or blood.  GENITOURINARY: No burning on urination, no polyuria NEUROLOGICAL: No headache, dizziness, syncope, paralysis, ataxia, numbness or tingling in the extremities. No change in bowel or bladder control.  MUSCULOSKELETAL: No muscle, back pain, joint pain or stiffness.  LYMPHATICS: No enlarged nodes. No history of splenectomy.  PSYCHIATRIC: No history of depression or anxiety.  ENDOCRINOLOGIC: No reports of sweating, cold or heat intolerance. No polyuria or polydipsia.  Marland Kitchen   Physical Examination Today's Vitals   10/21/19 0902  BP: (!) 143/61  Pulse: (!) 59  Temp: (!) 96.2 F (35.7 C)  SpO2: 96%  Weight: 188 lb (85.3 kg)  Height: 6' (1.829 m)   Body mass index is 25.5 kg/m.  Gen: resting comfortably, no acute distress HEENT: no scleral icterus, pupils equal round and reactive, no palptable cervical adenopathy,  CV: irreg, 2/6 systolic murmur rusb, no jvd Resp: Clear to auscultation bilaterally GI: abdomen is soft, non-tender, non-distended, normal bowel sounds, no hepatosplenomegaly MSK: extremities are warm, no edema.  Skin: warm, no rash Neuro:  no focal deficits Psych: appropriate affect   Diagnostic Studies 05/2017 cath   Mid RCA lesion, 50 %stenosed.  Ost RCA lesion, 25 %stenosed. Lesion noted in small RV marginal.  LM lesion, 25 %stenosed.  2nd Diag lesion, 40 %stenosed.  Mid LAD-1 lesion, 20 %stenosed.  Mid LAD-2 lesion, 95 %stenosed.  A STENT SYNERGY DES 2.5X16 drug eluting stent was successfully placed, postdilated to 3.0 mm.  Post intervention, there is a 0% residual stenosis.  The left ventricular systolic function is normal.  LV end diastolic pressure is moderately elevated.  The left ventricular  ejection fraction is 55-65% by visual estimate.  There is no aortic valve stenosis.  LAD was the culprit for his unstable angina. He had symptoms during the procedure at rest. These resolved after stent placement. Watch overnight. Continue IV Angiomax for another hour.  Loaded with Plavix today. We'll start 75 mg daily tomorrow. Given aspirin today as well. Would plan for aspirin, Plavix and Eliquis for 30 days. After 30 days, would stop the aspirin and continue Plavix and Eliquis. A synergy drug-eluting stent was used since he is on long-term anticoagulation. If there is a bleeding complication, his antiplatelet therapy could be stopped sooner than usual. Ideally, he would get 6-12 months of Plavix depending on his bleeding risk profile.  Possible discharge tomorrow if he does well tonight and with cardiac rehabilitation tomorrow.   05/2017 echo Study Conclusions  - Left ventricle: The cavity size was normal. Wall thickness was increased in a pattern of mild LVH. Systolic function was normal. The estimated ejection fraction was in the range of 60% to 65%. Wall motion was normal; there were no regional wall motion abnormalities. The study is not technically sufficient to allow evaluation of LV diastolic function. - Aortic valve: Mildly calcified annulus. Trileaflet; moderately thickened leaflets. Valve area (VTI): 2.98 cm^2. Valve area (Vmax): 2.7 cm^2. Valve area (Vmean): 2.56 cm^2. - Left atrium: The atrium was mildly dilated. - Technically adequate study.    Assessment and Plan  1. CAD - recent chest pain symptoms with negative ER workup - given his prior history of CAD will plan for an exercise nuclear stress test to further evaluate    F/u 3 months   Arnoldo Lenis, M.D.

## 2019-10-23 ENCOUNTER — Other Ambulatory Visit (HOSPITAL_COMMUNITY)
Admission: RE | Admit: 2019-10-23 | Discharge: 2019-10-23 | Disposition: A | Discharge status: Home / Self Care | Payer: Medicare Other | Source: Ambulatory Visit | Attending: Cardiology | Admitting: Cardiology

## 2019-10-23 ENCOUNTER — Other Ambulatory Visit: Payer: Self-pay

## 2019-10-23 DIAGNOSIS — Z01812 Encounter for preprocedural laboratory examination: Secondary | ICD-10-CM | POA: Diagnosis not present

## 2019-10-23 DIAGNOSIS — Z20828 Contact with and (suspected) exposure to other viral communicable diseases: Secondary | ICD-10-CM | POA: Diagnosis not present

## 2019-10-23 LAB — SARS CORONAVIRUS 2 (TAT 6-24 HRS): SARS Coronavirus 2: NEGATIVE

## 2019-10-26 ENCOUNTER — Encounter (HOSPITAL_COMMUNITY)
Admission: RE | Admit: 2019-10-26 | Discharge: 2019-10-26 | Disposition: A | Discharge status: Home / Self Care | Payer: Medicare Other | Source: Ambulatory Visit | Attending: Cardiology | Admitting: Cardiology

## 2019-10-26 ENCOUNTER — Telehealth: Payer: Self-pay

## 2019-10-26 ENCOUNTER — Other Ambulatory Visit: Payer: Self-pay

## 2019-10-26 ENCOUNTER — Encounter (HOSPITAL_COMMUNITY): Payer: Self-pay

## 2019-10-26 ENCOUNTER — Ambulatory Visit (HOSPITAL_COMMUNITY)
Admission: RE | Admit: 2019-10-26 | Discharge: 2019-10-26 | Disposition: A | Discharge status: Home / Self Care | Payer: Medicare Other | Source: Ambulatory Visit | Attending: Cardiology | Admitting: Cardiology

## 2019-10-26 DIAGNOSIS — R079 Chest pain, unspecified: Secondary | ICD-10-CM

## 2019-10-26 LAB — NM MYOCAR MULTI W/SPECT W/WALL MOTION / EF
Estimated workload: 10.1 METS
Exercise duration (min): 7 min
Exercise duration (sec): 1 s
LV dias vol: 96 mL (ref 62–150)
LV sys vol: 29 mL
MPHR: 133 {beats}/min
Peak HR: 96 {beats}/min
Percent HR: 72 %
RATE: 0.37
RPE: 17
Rest HR: 53 {beats}/min
SDS: 3
SRS: 3
SSS: 6
TID: 1.12

## 2019-10-26 MED ORDER — TECHNETIUM TC 99M TETROFOSMIN IV KIT
30.0000 | PACK | Freq: Once | INTRAVENOUS | Status: AC | PRN
  Administered 2019-10-26: 28 via INTRAVENOUS

## 2019-10-26 MED ORDER — TECHNETIUM TC 99M TETROFOSMIN IV KIT
10.0000 | PACK | Freq: Once | INTRAVENOUS | Status: AC | PRN
  Administered 2019-10-26: 10.4 via INTRAVENOUS

## 2019-10-26 MED ORDER — SODIUM CHLORIDE FLUSH 0.9 % IV SOLN
INTRAVENOUS | Status: AC
  Administered 2019-10-26: 10 mL via INTRAVENOUS
  Filled 2019-10-26: qty 10

## 2019-10-26 MED ORDER — REGADENOSON 0.4 MG/5ML IV SOLN
INTRAVENOUS | Status: AC
  Administered 2019-10-26: 0.4 mg via INTRAVENOUS
  Filled 2019-10-26: qty 5

## 2019-10-26 NOTE — Telephone Encounter (Signed)
-----   Message from Arnoldo Lenis, MD sent at 10/26/2019  3:59 PM EST ----- Normal stress test, no evidence of any new blockages. If still having symptoms we could try a medication called norvasc 2.5mg  daily that sometimes can help with chest pain, if symptoms have resolved just something to conitnue to monitor at this time   J BranchMD

## 2019-10-26 NOTE — Telephone Encounter (Signed)
Pt made aware, declined medication at this time. His symptoms has subsided. He will call to inform us if any new issues arise.

## 2019-10-30 ENCOUNTER — Other Ambulatory Visit: Payer: Self-pay | Admitting: Internal Medicine

## 2019-11-07 ENCOUNTER — Other Ambulatory Visit: Payer: Self-pay | Admitting: Cardiology

## 2019-12-08 ENCOUNTER — Other Ambulatory Visit: Payer: Self-pay | Admitting: Internal Medicine

## 2019-12-21 DIAGNOSIS — H52223 Regular astigmatism, bilateral: Secondary | ICD-10-CM | POA: Diagnosis not present

## 2019-12-21 DIAGNOSIS — H25813 Combined forms of age-related cataract, bilateral: Secondary | ICD-10-CM | POA: Diagnosis not present

## 2019-12-21 DIAGNOSIS — H5203 Hypermetropia, bilateral: Secondary | ICD-10-CM | POA: Diagnosis not present

## 2019-12-21 DIAGNOSIS — H40013 Open angle with borderline findings, low risk, bilateral: Secondary | ICD-10-CM | POA: Diagnosis not present

## 2019-12-29 ENCOUNTER — Ambulatory Visit: Payer: Medicare Other

## 2020-01-07 ENCOUNTER — Ambulatory Visit: Payer: Medicare Other

## 2020-01-07 DIAGNOSIS — C499 Malignant neoplasm of connective and soft tissue, unspecified: Secondary | ICD-10-CM | POA: Diagnosis not present

## 2020-01-07 DIAGNOSIS — Z08 Encounter for follow-up examination after completed treatment for malignant neoplasm: Secondary | ICD-10-CM | POA: Diagnosis not present

## 2020-01-07 DIAGNOSIS — Z85831 Personal history of malignant neoplasm of soft tissue: Secondary | ICD-10-CM | POA: Diagnosis not present

## 2020-01-11 ENCOUNTER — Other Ambulatory Visit: Payer: Self-pay | Admitting: Internal Medicine

## 2020-01-15 ENCOUNTER — Ambulatory Visit: Payer: Medicare Other

## 2020-01-26 ENCOUNTER — Encounter: Payer: Self-pay | Admitting: Cardiology

## 2020-01-26 ENCOUNTER — Ambulatory Visit (INDEPENDENT_AMBULATORY_CARE_PROVIDER_SITE_OTHER): Payer: Medicare Other | Admitting: Cardiology

## 2020-01-26 VITALS — BP 158/64 | HR 59 | Temp 96.9°F | Ht 72.0 in | Wt 189.0 lb

## 2020-01-26 DIAGNOSIS — R7309 Other abnormal glucose: Secondary | ICD-10-CM | POA: Diagnosis not present

## 2020-01-26 DIAGNOSIS — I4891 Unspecified atrial fibrillation: Secondary | ICD-10-CM | POA: Diagnosis not present

## 2020-01-26 DIAGNOSIS — I2510 Atherosclerotic heart disease of native coronary artery without angina pectoris: Secondary | ICD-10-CM

## 2020-01-26 DIAGNOSIS — E782 Mixed hyperlipidemia: Secondary | ICD-10-CM

## 2020-01-26 NOTE — Progress Notes (Signed)
Clinical Summary Mr. Leang is a 86 y.o.male seen today for follow up of the following medical problem.s   1. CAD - admit 05/2017 with chest pain - 05/2017 cath as reported below, received a DES to LAD - 05/2017 echo LVEF 60-65%, no WMAs  - seen in ER 10/2019 with chest pain - trop neg x 1. EKG afib without acute ischemic changes. CXR no acute process  10/2019 nuclear stress no ischemia - no recent chest pain. No SOB or DOE - compliant with meds   2. Afib - appears diagnosed around 01/2017 during admission. Started on eliquis and lopressor 12.5mg  bid. Due to continued dyspnea attempts were made to get him back into NSR, including multiple cardioversions and starting flecanide. Had tolerated lopressor at the time but stopped taking due to HRs in 40s or 50s, had been on 12.5mg  bid at the time.  - was on flecanide previously, discontued after diagnosed with CAD  We tried lopressor 12.5mg  bid last visit but caused fatigue and he stopped taking. Heart rates checked multiple times a day, usually 50s-60s.   - - no recent palpitations - no bleeding on eliquis.     3. Carotid stenosis - history of right CEA - 01/2018 carotid US RICA 1-39% and patent CEA, left 1-39%. Annual checks  - followed by vascular. Appt later this week  4. Hyperlipidemia - intolerant to statin, has been on low dose pravastatin.  - Dr Debara Pickett had discussed repatha, however patient declinded due to concern about too low cholesterol - 05/2018 TC 195 HDL 39 TG 73 LDL 141  - taking pravastatin 40mg  MWF - has been resistant to repatha.   10/2019 TC 243 HDL 44 LDL 179. He was off statin at that time but has since restarted   5. Elevated blood pressure - bp elevated in clinic but home bp's typically 100s/50s this AM.   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 Son in Sports coach is Levester Fresh  Had first covid vaccine at [employer].    Past Medical History:  Diagnosis Date  . Arthritis    neck   . Atrial fibrillation ([place])    a. new-onset in 01/2017. Started on Eliquis  . CAD (coronary artery disease), native coronary artery 05/16/2017   LAD 95%>>0 w/ 2.5 x 16 mm Synergy DES, D2 40%, RCA 50%, EF 55-65%, LVEDP mod elevated  . Calcification of coronary artery    a. mild, nonobstructive CAD by cath in 2009.  Marland Kitchen Cancer of skin of chest   . Carotid artery occlusion    a. s/p R CEA in 2014  . Colon polyps   . Hyperlipidemia    takes Pravastatin every other day  . Pneumonia ~ 01/2013     Allergies  Allergen Reactions  . Lipitor [Atorvastatin Calcium] Other (See Comments)    Muscle pain  . Vantin [Cefpodoxime] Rash     Current Outpatient Medications  Medication Sig Dispense Refill  . ALPHA LIPOIC ACID PO Take 300 mg by mouth daily.    . Cholecalciferol (VITAMIN D3) 2000 UNITS capsule Take 2,000 Units by mouth daily.     . Coenzyme Q10-Levocarnitine 100-20 MG CAPS Take 1 capsule by mouth daily.     Marland Kitchen ELIQUIS 5 MG TABS tablet TAKE (1) TABLET BY MOUTH TWICE DAILY. 180 tablet 1  . Glucosamine-Chondroitin 500-400 MG CAPS Take 1 capsule by mouth daily.     Marland Kitchen  Magnesium 500 MG TABS Take 1 tablet by mouth daily.    . Multiple Vitamin (MULTIVITAMIN) tablet Take 0.5 tablets by mouth daily.     . nitroGLYCERIN (NITROSTAT) 0.4 MG SL tablet Place 1 tablet (0.4 mg total) under the tongue every 5 (five) minutes as needed for chest pain. 30 tablet 0  . OMEGA-3 1000 MG CAPS Take 4,000 mg by mouth daily.    . pravastatin (PRAVACHOL) 40 MG tablet TAKE 1/2 TABLET BY MOUTH EVERY MONDAY, WEDNESDAY AND FRIDAY. 6 tablet 6  . Probiotic Product (PROBIOTIC DAILY PO) Take 1 capsule by mouth daily.    . sildenafil (REVATIO) 20 MG tablet TAKE 2 TO 3 TABLETS BY MOUTH AS NEEDED FOR SEXUAL ACTIVITY 60 tablet 4   No current facility-administered medications for this visit.     Past Surgical History:  Procedure Laterality Date  . CARDIAC  CATHETERIZATION  06/09/1999   mild CAD (LAD, diagonal, RCA) - Dr. Loni Muse. [facility]  . CARDIOVERSION N/A 03/25/2017   Procedure: CARDIOVERSION;  Surgeon: Pixie Casino, MD;  Location: [facility] ENDOSCOPY;  Service: Cardiovascular;  Laterality: N/A;  . CAROTID DOPPLER  02/2012   60-79% RICA stenosis, 123456 LICA stenosis (prior to carotid endarterectomy)  . COLONOSCOPY    . CORONARY STENT INTERVENTION N/A 05/16/2017   Procedure: Coronary Stent Intervention;  Surgeon: Jettie Booze, MD;  Location: [place] CV LAB;  Service: Cardiovascular;  Laterality: N/A;  . ENDARTERECTOMY Right 11/10/2013   Procedure: ENDARTERECTOMY CAROTID;  Surgeon: Conrad Ruso, MD;  Location: [place];  Service: Vascular;  Laterality: Right;  . INGUINAL HERNIA REPAIR Left   . LEFT HEART CATH AND CORONARY ANGIOGRAPHY N/A 05/16/2017   Procedure: Left Heart Cath and Coronary Angiography;  Surgeon: Jettie Booze, MD;  Location: [place] CV LAB;  Service: Cardiovascular;  Laterality: N/A;  . MOLE REMOVAL  1990s   "chest"  . NM MYOCAR PERF WALL MOTION  04/2011   bruce myoview - perfusion defect in inferior myocardium (diaphragmatic attenuation), remaining myocardium with normal perfusion, EF 67%  . PATCH ANGIOPLASTY Right 11/10/2013   Procedure: PATCH ANGIOPLASTY;  Surgeon: Conrad Nellie, MD;  Location: [place];  Service: Vascular;  Laterality: Right;  . PILONIDAL CYST DRAINAGE    . SHOULDER OPEN ROTATOR CUFF REPAIR Bilateral 1998 - 2001  . SKIN CANCER EXCISION  ~ 2015   "chest"  . TONSILLECTOMY       Allergies  Allergen Reactions  . Lipitor [Atorvastatin Calcium] Other (See Comments)    Muscle pain  . Vantin [Cefpodoxime] Rash      Family History  Problem Relation Age of Onset  . Stroke Father   . Hypertension Mother   . Prostate cancer Son   . Stroke Brother   . Cancer Brother        spine     Social History Mr. Gonnerman reports that he quit smoking about 47 years ago. His smoking use included cigarettes. He  has a 18.00 pack-year smoking history. He has never used smokeless tobacco. Mr. Lisiecki reports current alcohol use of about 14.0 standard drinks of alcohol per week.   Review of Systems CONSTITUTIONAL: No weight loss, fever, chills, weakness or fatigue.  HEENT: Eyes: No visual loss, blurred vision, double vision or yellow sclerae.No hearing loss, sneezing, congestion, runny nose or sore throat.  SKIN: No rash or itching.  CARDIOVASCULAR: per hpi RESPIRATORY: No shortness of breath, cough or sputum.  GASTROINTESTINAL: No anorexia, nausea, vomiting or diarrhea. No abdominal pain  or blood.  GENITOURINARY: No burning on urination, no polyuria NEUROLOGICAL: No headache, dizziness, syncope, paralysis, ataxia, numbness or tingling in the extremities. No change in bowel or bladder control.  MUSCULOSKELETAL: No muscle, back pain, joint pain or stiffness.  LYMPHATICS: No enlarged nodes. No history of splenectomy.  PSYCHIATRIC: No history of depression or anxiety.  ENDOCRINOLOGIC: No reports of sweating, cold or heat intolerance. No polyuria or polydipsia.  Marland Kitchen   Physical Examination Today's Vitals   01/26/20 0838  BP: (!) 158/64  Pulse: (!) 59  Temp: (!) 96.9 F (36.1 C)  SpO2: 98%  Weight: 189 lb (85.7 kg)  Height: 6' (1.829 m)   Body mass index is 25.63 kg/m.  Gen: resting comfortably, no acute distress HEENT: no scleral icterus, pupils equal round and reactive, no palptable cervical adenopathy,  CV: RRR, 2/6 systolic murmur rusb, no jvd Resp: Clear to auscultation bilaterally GI: abdomen is soft, non-tender, non-distended, normal bowel sounds, no hepatosplenomegaly MSK: extremities are warm, no edema.  Skin: warm, no rash Neuro:  no focal deficits Psych: appropriate affect   Diagnostic Studies 05/2017 cath   Mid RCA lesion, 50 %stenosed.  Ost RCA lesion, 25 %stenosed. Lesion noted in small RV marginal.  LM lesion, 25 %stenosed.  2nd Diag lesion, 40 %stenosed.  Mid  LAD-1 lesion, 20 %stenosed.  Mid LAD-2 lesion, 95 %stenosed.  A STENT SYNERGY DES 2.5X16 drug eluting stent was successfully placed, postdilated to 3.0 mm.  Post intervention, there is a 0% residual stenosis.  The left ventricular systolic function is normal.  LV end diastolic pressure is moderately elevated.  The left ventricular ejection fraction is 55-65% by visual estimate.  There is no aortic valve stenosis.  LAD was the culprit for his unstable angina. He had symptoms during the procedure at rest. These resolved after stent placement. Watch overnight. Continue IV Angiomax for another hour.  Loaded with Plavix today. We'll start 75 mg daily tomorrow. Given aspirin today as well. Would plan for aspirin, Plavix and Eliquis for 30 days. After 30 days, would stop the aspirin and continue Plavix and Eliquis. A synergy drug-eluting stent was used since he is on long-term anticoagulation. If there is a bleeding complication, his antiplatelet therapy could be stopped sooner than usual. Ideally, he would get 6-12 months of Plavix depending on his bleeding risk profile.  Possible discharge tomorrow if he does well tonight and with cardiac rehabilitation tomorrow.   05/2017 echo Study Conclusions  - Left ventricle: The cavity size was normal. Wall thickness was increased in a pattern of mild LVH. Systolic function was normal. The estimated ejection fraction was in the range of 60% to 65%. Wall motion was normal; there were no regional wall motion abnormalities. The study is not technically sufficient to allow evaluation of LV diastolic function. - Aortic valve: Mildly calcified annulus. Trileaflet; moderately thickened leaflets. Valve area (VTI): 2.98 cm^2. Valve area (Vmax): 2.7 cm^2. Valve area (Vmean): 2.56 cm^2. - Left atrium: The atrium was mildly dilated. - Technically adequate study.   10/2019 nuclear stress  Unable to achieve adequate heart rate response  with GXT, Lexiscan utilized. No diagnostic ST segment changes.  Small, mild intensity, inferior/inferoseptal defect extending from apex to base that is fixed (actually more prominent at rest) and consistent with soft tissue attenuation. No reversible defects to indicate ischemia.  This is a low risk study.  Nuclear stress EF: 69%.   Assessment and Plan   1. CAD -no recent symptoms. Recent stress test without ischemia -  continue current meds  2. Afib - no symptoms, continue current meds  3. Hyperlipidemia - repeat lipid panel. Pending results consider adding zetia to his statin regimen vs changing to repatha, he has been limited in statin tolerance.     F/u 6 months     Arnoldo Lenis, M.D.

## 2020-01-26 NOTE — Patient Instructions (Signed)
Medication Instructions:  Your physician recommends that you continue on your current medications as directed. Please refer to the Current Medication list given to you today.   Labwork: TSH, CBC, CMET, A1C, FASTING LIPIDS, MAGNESIUM  Testing/Procedures: NONE  Follow-Up: Your physician wants you to follow-up in: 6 MONTHS.  You will receive a reminder letter in the mail two months in advance. If you don't receive a letter, please call our office to schedule the follow-up appointment.   Any Other Special Instructions Will Be Listed Below (If Applicable).     If you need a refill on your cardiac medications before your next appointment, please call your pharmacy.

## 2020-01-28 ENCOUNTER — Telehealth (HOSPITAL_COMMUNITY): Payer: Self-pay

## 2020-01-28 ENCOUNTER — Other Ambulatory Visit: Payer: Self-pay

## 2020-01-28 ENCOUNTER — Other Ambulatory Visit (HOSPITAL_COMMUNITY)
Admission: RE | Admit: 2020-01-28 | Discharge: 2020-01-28 | Disposition: A | Discharge status: Home / Self Care | Payer: Medicare Other | Source: Ambulatory Visit | Attending: Cardiology | Admitting: Cardiology

## 2020-01-28 DIAGNOSIS — E782 Mixed hyperlipidemia: Secondary | ICD-10-CM | POA: Diagnosis not present

## 2020-01-28 DIAGNOSIS — I2510 Atherosclerotic heart disease of native coronary artery without angina pectoris: Secondary | ICD-10-CM | POA: Insufficient documentation

## 2020-01-28 DIAGNOSIS — R7309 Other abnormal glucose: Secondary | ICD-10-CM | POA: Insufficient documentation

## 2020-01-28 DIAGNOSIS — I4891 Unspecified atrial fibrillation: Secondary | ICD-10-CM | POA: Diagnosis not present

## 2020-01-28 LAB — COMPREHENSIVE METABOLIC PANEL
ALT: 16 U/L (ref 0–44)
AST: 20 U/L (ref 15–41)
Albumin: 4 g/dL (ref 3.5–5.0)
Alkaline Phosphatase: 52 U/L (ref 38–126)
Anion gap: 7 (ref 5–15)
BUN: 17 mg/dL (ref 8–23)
CO2: 26 mmol/L (ref 22–32)
Calcium: 8.9 mg/dL (ref 8.9–10.3)
Chloride: 102 mmol/L (ref 98–111)
Creatinine, Ser: 1.11 mg/dL (ref 0.61–1.24)
GFR calc Af Amer: >60 mL/min (ref >60)
GFR calc non Af Amer: 59 mL/min — ABNORMAL LOW (ref >60)
Glucose, Bld: 91 mg/dL (ref 70–99)
Potassium: 4.7 mmol/L (ref 3.5–5.1)
Sodium: 135 mmol/L (ref 135–145)
Total Bilirubin: 1 mg/dL (ref 0.3–1.2)
Total Protein: 7.4 g/dL (ref 6.5–8.1)

## 2020-01-28 LAB — CBC WITH DIFFERENTIAL/PLATELET
Abs Immature Granulocytes: 0.01 10*3/uL (ref 0.00–0.07)
Basophils Absolute: 0 10*3/uL (ref 0.0–0.1)
Basophils Relative: 0 %
Eosinophils Absolute: 0.2 10*3/uL (ref 0.0–0.5)
Eosinophils Relative: 4 %
HCT: 42.2 % (ref 39.0–52.0)
Hemoglobin: 14.5 g/dL (ref 13.0–17.0)
Immature Granulocytes: 0 %
Lymphocytes Relative: 30 %
Lymphs Abs: 1.6 10*3/uL (ref 0.7–4.0)
MCH: 32.1 pg (ref 26.0–34.0)
MCHC: 34.4 g/dL (ref 30.0–36.0)
MCV: 93.4 fL (ref 80.0–100.0)
Monocytes Absolute: 0.4 10*3/uL (ref 0.1–1.0)
Monocytes Relative: 8 %
Neutro Abs: 3 10*3/uL (ref 1.7–7.7)
Neutrophils Relative %: 58 %
Platelets: 229 10*3/uL (ref 150–400)
RBC: 4.52 MIL/uL (ref 4.22–5.81)
RDW: 12.7 % (ref 11.5–15.5)
WBC: 5.2 10*3/uL (ref 4.0–10.5)
nRBC: 0 % (ref 0.0–0.2)

## 2020-01-28 LAB — LIPID PANEL
Cholesterol: 197 mg/dL (ref 0–200)
HDL: 45 mg/dL (ref >40)
LDL Cholesterol: 138 mg/dL — ABNORMAL HIGH (ref 0–99)
Total CHOL/HDL Ratio: 4.4 RATIO
Triglycerides: 71 mg/dL (ref <150)
VLDL: 14 mg/dL (ref 0–40)

## 2020-01-28 LAB — HEMOGLOBIN A1C
Hgb A1c MFr Bld: 5.2 % (ref 4.8–5.6)
Mean Plasma Glucose: 102.54 mg/dL

## 2020-01-28 LAB — MAGNESIUM: Magnesium: 2.4 mg/dL (ref 1.7–2.4)

## 2020-01-28 LAB — TSH: TSH: 2.139 u[IU]/mL (ref 0.350–4.500)

## 2020-01-28 NOTE — Telephone Encounter (Signed)

## 2020-01-29 ENCOUNTER — Ambulatory Visit (INDEPENDENT_AMBULATORY_CARE_PROVIDER_SITE_OTHER): Payer: Medicare Other | Admitting: Physician Assistant

## 2020-01-29 ENCOUNTER — Other Ambulatory Visit: Payer: Self-pay

## 2020-01-29 ENCOUNTER — Ambulatory Visit (HOSPITAL_COMMUNITY)
Admission: RE | Admit: 2020-01-29 | Discharge: 2020-01-29 | Disposition: A | Discharge status: Home / Self Care | Payer: Medicare Other | Source: Ambulatory Visit | Attending: Surgery | Admitting: Surgery

## 2020-01-29 VITALS — BP 138/61 | HR 51 | Temp 98.2°F | Resp 20 | Ht 72.0 in | Wt 190.0 lb

## 2020-01-29 DIAGNOSIS — I6523 Occlusion and stenosis of bilateral carotid arteries: Secondary | ICD-10-CM

## 2020-01-29 NOTE — Progress Notes (Signed)
    Established Carotid Patient   History of Present Illness   LUE MAYEAUX is a 86 y.o. ([birth year]) male who presents to go over vascular studies related to carotid artery stenosis.  Surgical history significant for right carotid endarterectomy by Dr. Bridgett Larsson in 2014.  He denies any diagnosis of TIA or CVA in the last year.  He also denies any strokelike symptoms including slurring speech, changes in vision, or one-sided weakness.  He is taking a statin daily.  He is taking Eliquis for atrial fibrillation.  The patient's PMH, PSH, SH, and FamHx were reviewed and are unchanged from prior visit.  Current Outpatient Medications  Medication Sig Dispense Refill  . ALPHA LIPOIC ACID PO Take 300 mg by mouth daily.    . Cholecalciferol (VITAMIN D3) 2000 UNITS capsule Take 2,000 Units by mouth daily.     . Coenzyme Q10-Levocarnitine 100-20 MG CAPS Take 1 capsule by mouth daily.     Marland Kitchen ELIQUIS 5 MG TABS tablet TAKE (1) TABLET BY MOUTH TWICE DAILY. 180 tablet 1  . Glucosamine-Chondroitin 500-400 MG CAPS Take 1 capsule by mouth daily.     . Magnesium 500 MG TABS Take 1 tablet by mouth daily.    . Multiple Vitamin (MULTIVITAMIN) tablet Take 0.5 tablets by mouth daily.     . nitroGLYCERIN (NITROSTAT) 0.4 MG SL tablet Place 1 tablet (0.4 mg total) under the tongue every 5 (five) minutes as needed for chest pain. 30 tablet 0  . OMEGA-3 1000 MG CAPS Take 4,000 mg by mouth daily.    . pravastatin (PRAVACHOL) 40 MG tablet TAKE 1/2 TABLET BY MOUTH EVERY MONDAY, WEDNESDAY AND FRIDAY. 6 tablet 6  . Probiotic Product (PROBIOTIC DAILY PO) Take 1 capsule by mouth daily.    . sildenafil (REVATIO) 20 MG tablet TAKE 2 TO 3 TABLETS BY MOUTH AS NEEDED FOR SEXUAL ACTIVITY 60 tablet 4   No current facility-administered medications for this visit.    On ROS today: 10 system ROS negative unless otherwise noted in HPI   Physical Examination   Vitals:   01/29/20 1455 01/29/20 1458  BP: (!) 135/58 138/61  Pulse: (!)  51   Resp: 20   Temp: 98.2 F (36.8 C)   SpO2: 96%   Weight: 190 lb (86.2 kg)   Height: 6' (1.829 m)    Body mass index is 25.77 kg/m.  General Alert, O x 3, WD, NAD  Neck Supple, mid-line trachea,  neck incision healed  Pulmonary Sym exp, good B air movt,   Cardiac RRR, Nl S1, S2,   Gastro- intestinal soft, non-distended, non-tender to palpation,   Musculo- skeletal M/S 5/5 throughout  , Extremities without ischemic changes    Neurologic Cranial nerves 2-12 intact    Non-Invasive Vascular Imaging   B Carotid Duplex :   R ICA stenosis:  1-39%  R VA:  patent and antegrade  L ICA stenosis:  1-39%  L VA:  patent and antegrade   Medical Decision Making   RAYAN MONTIE is a 86 y.o. male who presents for routine carotid artery surveillance   Carotid duplex findings unchanged over the past year  Right CEA surgery site without any hemodynamically significant stenosis  Left ICA stenosis estimated to be 1 to 39%  Continue statin daily  Recheck carotid duplex in 1 year   Dagoberto Ligas PA-C [facility] Office: [phone]  Clinic MD: Donzetta Matters

## 2020-02-01 ENCOUNTER — Telehealth: Payer: Self-pay

## 2020-02-01 ENCOUNTER — Other Ambulatory Visit: Payer: Self-pay | Admitting: *Deleted

## 2020-02-01 DIAGNOSIS — I6523 Occlusion and stenosis of bilateral carotid arteries: Secondary | ICD-10-CM

## 2020-02-01 NOTE — Telephone Encounter (Signed)
Pt made aware of results. He would like a copy of labs & he will think about repatha.

## 2020-02-01 NOTE — Telephone Encounter (Signed)
-----   Message from Arnoldo Lenis, MD sent at 02/01/2020 10:43 AM EST ----- Cholesterol remains too high. Dr Debara Pickett had discussed with him repatha in the past and even looks like it was approved but not started. Is he willing to reconsider repatha, I think that would be the best option. It is an injection you do twice a week. If resistant to repatha, a less optimal option would be adding zetia which is a pill, which will decrease his cholesterol some but not nearly as much as repatha  Zandra Abts MD

## 2020-02-05 DIAGNOSIS — L821 Other seborrheic keratosis: Secondary | ICD-10-CM | POA: Diagnosis not present

## 2020-02-05 DIAGNOSIS — L300 Nummular dermatitis: Secondary | ICD-10-CM | POA: Diagnosis not present

## 2020-02-05 DIAGNOSIS — D1801 Hemangioma of skin and subcutaneous tissue: Secondary | ICD-10-CM | POA: Diagnosis not present

## 2020-02-05 DIAGNOSIS — D229 Melanocytic nevi, unspecified: Secondary | ICD-10-CM | POA: Diagnosis not present

## 2020-02-17 ENCOUNTER — Other Ambulatory Visit: Payer: Self-pay

## 2020-02-17 ENCOUNTER — Ambulatory Visit: Payer: Medicare Other

## 2020-03-04 ENCOUNTER — Other Ambulatory Visit: Payer: Medicare Other

## 2020-03-08 ENCOUNTER — Ambulatory Visit: Payer: Medicare Other | Attending: Internal Medicine

## 2020-03-08 ENCOUNTER — Other Ambulatory Visit: Payer: Self-pay

## 2020-03-08 DIAGNOSIS — Z20822 Contact with and (suspected) exposure to covid-19: Secondary | ICD-10-CM | POA: Diagnosis not present

## 2020-03-09 LAB — SARS-COV-2, NAA 2 DAY TAT

## 2020-03-09 LAB — NOVEL CORONAVIRUS, NAA: SARS-CoV-2, NAA: NOT DETECTED

## 2020-04-05 DIAGNOSIS — Z125 Encounter for screening for malignant neoplasm of prostate: Secondary | ICD-10-CM | POA: Diagnosis not present

## 2020-04-05 DIAGNOSIS — Z0001 Encounter for general adult medical examination with abnormal findings: Secondary | ICD-10-CM | POA: Diagnosis not present

## 2020-04-12 DIAGNOSIS — E785 Hyperlipidemia, unspecified: Secondary | ICD-10-CM | POA: Diagnosis not present

## 2020-04-12 DIAGNOSIS — N400 Enlarged prostate without lower urinary tract symptoms: Secondary | ICD-10-CM | POA: Diagnosis not present

## 2020-04-12 DIAGNOSIS — I482 Chronic atrial fibrillation, unspecified: Secondary | ICD-10-CM | POA: Diagnosis not present

## 2020-04-12 DIAGNOSIS — I2510 Atherosclerotic heart disease of native coronary artery without angina pectoris: Secondary | ICD-10-CM | POA: Diagnosis not present

## 2020-05-17 ENCOUNTER — Ambulatory Visit: Payer: Medicare Other | Admitting: Family Medicine

## 2020-06-15 DIAGNOSIS — R413 Other amnesia: Secondary | ICD-10-CM | POA: Diagnosis not present

## 2020-06-15 DIAGNOSIS — I2510 Atherosclerotic heart disease of native coronary artery without angina pectoris: Secondary | ICD-10-CM | POA: Diagnosis not present

## 2020-06-15 DIAGNOSIS — I4891 Unspecified atrial fibrillation: Secondary | ICD-10-CM | POA: Diagnosis not present

## 2020-06-15 DIAGNOSIS — Z79899 Other long term (current) drug therapy: Secondary | ICD-10-CM | POA: Diagnosis not present

## 2020-06-16 ENCOUNTER — Other Ambulatory Visit: Payer: Self-pay | Admitting: Internal Medicine

## 2020-06-16 ENCOUNTER — Other Ambulatory Visit: Payer: Self-pay | Admitting: Cardiology

## 2020-06-22 DIAGNOSIS — I482 Chronic atrial fibrillation, unspecified: Secondary | ICD-10-CM | POA: Diagnosis not present

## 2020-06-22 DIAGNOSIS — N401 Enlarged prostate with lower urinary tract symptoms: Secondary | ICD-10-CM | POA: Diagnosis not present

## 2020-06-27 ENCOUNTER — Other Ambulatory Visit: Payer: Self-pay | Admitting: Family Medicine

## 2020-07-18 ENCOUNTER — Emergency Department (HOSPITAL_COMMUNITY): Payer: Medicare Other

## 2020-07-18 ENCOUNTER — Telehealth: Payer: Self-pay | Admitting: Cardiology

## 2020-07-18 ENCOUNTER — Emergency Department (HOSPITAL_COMMUNITY)
Admission: EM | Admit: 2020-07-18 | Discharge: 2020-07-18 | Disposition: A | Discharge status: Home / Self Care | Payer: Medicare Other | Attending: Emergency Medicine | Admitting: Emergency Medicine

## 2020-07-18 ENCOUNTER — Encounter (HOSPITAL_COMMUNITY): Payer: Self-pay | Admitting: Emergency Medicine

## 2020-07-18 ENCOUNTER — Other Ambulatory Visit: Payer: Self-pay

## 2020-07-18 DIAGNOSIS — I2510 Atherosclerotic heart disease of native coronary artery without angina pectoris: Secondary | ICD-10-CM | POA: Insufficient documentation

## 2020-07-18 DIAGNOSIS — C44519 Basal cell carcinoma of skin of other part of trunk: Secondary | ICD-10-CM | POA: Diagnosis not present

## 2020-07-18 DIAGNOSIS — I4891 Unspecified atrial fibrillation: Secondary | ICD-10-CM | POA: Diagnosis not present

## 2020-07-18 DIAGNOSIS — I10 Essential (primary) hypertension: Secondary | ICD-10-CM | POA: Insufficient documentation

## 2020-07-18 DIAGNOSIS — Z87891 Personal history of nicotine dependence: Secondary | ICD-10-CM | POA: Insufficient documentation

## 2020-07-18 DIAGNOSIS — E785 Hyperlipidemia, unspecified: Secondary | ICD-10-CM | POA: Insufficient documentation

## 2020-07-18 DIAGNOSIS — R5383 Other fatigue: Secondary | ICD-10-CM | POA: Insufficient documentation

## 2020-07-18 DIAGNOSIS — Z7901 Long term (current) use of anticoagulants: Secondary | ICD-10-CM | POA: Insufficient documentation

## 2020-07-18 DIAGNOSIS — I4811 Longstanding persistent atrial fibrillation: Secondary | ICD-10-CM | POA: Diagnosis not present

## 2020-07-18 DIAGNOSIS — J9811 Atelectasis: Secondary | ICD-10-CM | POA: Diagnosis not present

## 2020-07-18 LAB — MAGNESIUM: Magnesium: 2.3 mg/dL (ref 1.7–2.4)

## 2020-07-18 LAB — BASIC METABOLIC PANEL
Anion gap: 5 (ref 5–15)
BUN: 20 mg/dL (ref 8–23)
CO2: 26 mmol/L (ref 22–32)
Calcium: 8.8 mg/dL — ABNORMAL LOW (ref 8.9–10.3)
Chloride: 102 mmol/L (ref 98–111)
Creatinine, Ser: 0.98 mg/dL (ref 0.61–1.24)
GFR calc Af Amer: >60 mL/min (ref >60)
GFR calc non Af Amer: >60 mL/min (ref >60)
Glucose, Bld: 92 mg/dL (ref 70–99)
Potassium: 4.9 mmol/L (ref 3.5–5.1)
Sodium: 133 mmol/L — ABNORMAL LOW (ref 135–145)

## 2020-07-18 LAB — CBC
HCT: 43.4 % (ref 39.0–52.0)
Hemoglobin: 14.5 g/dL (ref 13.0–17.0)
MCH: 31.9 pg (ref 26.0–34.0)
MCHC: 33.4 g/dL (ref 30.0–36.0)
MCV: 95.4 fL (ref 80.0–100.0)
Platelets: 201 10*3/uL (ref 150–400)
RBC: 4.55 MIL/uL (ref 4.22–5.81)
RDW: 13.1 % (ref 11.5–15.5)
WBC: 6.9 10*3/uL (ref 4.0–10.5)
nRBC: 0 % (ref 0.0–0.2)

## 2020-07-18 LAB — TROPONIN I (HIGH SENSITIVITY): Troponin I (High Sensitivity): 5 ng/L (ref <18)

## 2020-07-18 NOTE — ED Notes (Signed)
Pt resting with eyes closed at present,

## 2020-07-18 NOTE — ED Triage Notes (Signed)
Patient states he checked his blood pressure and heart rate on Saturday and heart rate was in the 90s.  Normally his heart rate is in the 50s.  Feels tired.

## 2020-07-18 NOTE — ED Provider Notes (Signed)
[facility] EMERGENCY DEPARTMENT Provider Note   CSN: [id number] Arrival date & time: 07/18/20  9798     History No chief complaint on file.   Sean Miranda is a 86 y.o. male with a history of atrial fibrillation on Eliquis, CAD, history of carotid artery occlusion with surgical intervention in 2014, hyperlipidemia presenting with tachycardia which he noted 2 days ago.  He has a blood pressure cuff which he checks his blood pressure every morning and on Saturday his rate was in the 90s, stating his normal heart rates are in the 50s and low 60s.  He endorses exercising daily after which his blood pressure can be as low as mid 90s which is normal for him, stating he denies any weakness or lightheadedness when his blood pressure is this low.  He noted feeling tired on Saturday but not since, stating he currently feels well and is without complaint.  Specifically he denies chest pain, shortness of breath, no palpitations or lightheadedness.  Has maintained a good appetite, no risk for dehydration.  Has had no recent changes in his medications.  HPI     Past Medical History:  Diagnosis Date  . Arthritis    neck   . Atrial fibrillation ([place])    a. new-onset in 01/2017. Started on Eliquis  . CAD (coronary artery disease), native coronary artery 05/16/2017   LAD 95%>>0 w/ 2.5 x 16 mm Synergy DES, D2 40%, RCA 50%, EF 55-65%, LVEDP mod elevated  . Calcification of coronary artery    a. mild, nonobstructive CAD by cath in 2009.  Marland Kitchen Cancer of skin of chest   . Carotid artery occlusion    a. s/p R CEA in 2014  . Colon polyps   . Hyperlipidemia    takes Pravastatin every other day  . Pneumonia ~ 01/2013    Patient Active Problem List   Diagnosis Date Noted  . History of right-sided carotid endarterectomy 04/10/2018  . Cancer of skin of chest   . Arthritis   . S/P primary angioplasty with coronary stent 06/12/2017  . Blood clotting disorder ([place]) 04/16/2017  . Atrial fibrillation ([place])  02/21/2017  . 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 Essential hypertension 05/27/2014  . Occlusion and stenosis of carotid artery without mention of cerebral infarction 11/06/2013  . 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 Coronary atherosclerosis 11/03/2010  . Carotid artery disease ([place]) 12/26/2009  . Hyperlipidemia 08/12/2008  . COLONIC POLYPS, HX OF 06/03/2007    Past Surgical History:  Procedure Laterality Date  . CARDIAC CATHETERIZATION  06/09/1999   mild CAD (LAD, diagonal, RCA) - Dr. Loni Muse. [place]  . CARDIOVERSION N/A 03/25/2017   Procedure: CARDIOVERSION;  Surgeon: Pixie Casino, MD;  Location: [facility] ENDOSCOPY;  Service: Cardiovascular;  Laterality: N/A;  . CAROTID DOPPLER  02/2012   60-79% RICA stenosis, 92-11% LICA stenosis (prior to carotid endarterectomy)  . COLONOSCOPY    . CORONARY STENT INTERVENTION N/A 05/16/2017   Procedure: Coronary Stent Intervention;  Surgeon: Jettie Booze, MD;  Location: [facility] CV LAB;  Service: Cardiovascular;  Laterality: N/A;  . ENDARTERECTOMY Right 11/10/2013   Procedure: ENDARTERECTOMY CAROTID;  Surgeon: Conrad Winchester, MD;  Location: [place];  Service: Vascular;  Laterality: Right;  . INGUINAL HERNIA REPAIR Left   . LEFT HEART CATH AND CORONARY ANGIOGRAPHY N/A 05/16/2017   Procedure: Left Heart Cath and Coronary Angiography;  Surgeon: Jettie Booze, MD;  Location: [place] CV LAB;  Service: Cardiovascular;  Laterality:  N/A;  . MOLE REMOVAL  1990s   "chest"  . NM MYOCAR PERF WALL MOTION  04/2011   bruce myoview - perfusion defect in inferior myocardium (diaphragmatic attenuation), remaining myocardium with normal perfusion, EF 67%  . PATCH ANGIOPLASTY Right 11/10/2013   Procedure: PATCH ANGIOPLASTY;  Surgeon: Conrad Roanoke, MD;  Location: [place];  Service: Vascular;  Laterality: Right;  . PILONIDAL CYST DRAINAGE    . SHOULDER OPEN ROTATOR CUFF REPAIR Bilateral 1998 - 2001  . SKIN CANCER EXCISION  ~ 2015   "chest"  .  TONSILLECTOMY         Family History  Problem Relation Age of Onset  . Stroke Father   . Hypertension Mother   . Prostate cancer Son   . Stroke Brother   . Cancer Brother        spine    Social History   Tobacco Use  . Smoking status: Former Smoker    Packs/day: 1.00    Years: 18.00    Pack years: 18.00    Types: Cigarettes    Quit date: 12/03/1972    Years since quitting: 47.6  . Smokeless tobacco: Never Used  Vaping Use  . Vaping Use: Never used  Substance Use Topics  . Alcohol use: Yes    Alcohol/week: 14.0 standard drinks    Types: 7 Glasses of wine, 7 Shots of liquor per week  . Drug use: No    Home Medications Prior to Admission medications   Medication Sig Start Date End Date Taking? Authorizing Provider  ALPHA LIPOIC ACID PO Take 300 mg by mouth daily.    [provider]  Cholecalciferol (VITAMIN D3) 2000 UNITS capsule Take 2,000 Units by mouth daily.     [provider]  Coenzyme Q10-Levocarnitine 100-20 MG CAPS Take 1 capsule by mouth daily.     [provider]  ELIQUIS 5 MG TABS tablet TAKE (1) TABLET BY MOUTH TWICE DAILY. 06/16/20   Hilty, Nadean Corwin, MD  Glucosamine-Chondroitin 500-400 MG CAPS Take 1 capsule by mouth daily.     [provider]  Magnesium 500 MG TABS Take 1 tablet by mouth daily.    [provider]  Multiple Vitamin (MULTIVITAMIN) tablet Take 0.5 tablets by mouth daily.     [provider]  nitroGLYCERIN (NITROSTAT) 0.4 MG SL tablet Place 1 tablet (0.4 mg total) under the tongue every 5 (five) minutes as needed for chest pain. 10/13/19   Ripley Fraise, MD  OMEGA-3 1000 MG CAPS Take 4,000 mg by mouth daily.    [provider]  pravastatin (PRAVACHOL) 40 MG tablet TAKE 1/2 TABLET BY MOUTH EVERY MONDAY, WEDNESDAY AND FRIDAY. 06/16/20   Arnoldo Lenis, MD  Probiotic Product (PROBIOTIC DAILY PO) Take 1 capsule by mouth daily.    [provider]  sildenafil (REVATIO) 20 MG  tablet TAKE 2 TO 3 TABLETS BY MOUTH AS NEEDED FOR SEXUAL ACTIVITY 02/07/18   Marletta Lor, MD    Allergies    Lipitor [atorvastatin calcium] and Vantin [cefpodoxime]  Review of Systems   Review of Systems  Constitutional: Positive for fatigue. Negative for activity change, appetite change, chills and fever.  HENT: Negative for congestion.   Eyes: Negative.   Respiratory: Negative for chest tightness and shortness of breath.   Cardiovascular: Negative for chest pain, palpitations and leg swelling.       Higher than normal heart rate, but not tachycardia.  Gastrointestinal: Negative for abdominal pain, nausea and vomiting.  Genitourinary: Negative.   Musculoskeletal: Negative for arthralgias, joint swelling and neck pain.  Skin: Negative.  Negative for rash and wound.  Neurological: Negative for dizziness, weakness, light-headedness, numbness and headaches.  Psychiatric/Behavioral: Negative.     Physical Exam Updated Vital Signs BP (!) 106/56   Pulse 86   Temp 98.1 F (36.7 C) (Oral)   Resp 16   Ht 6' (1.829 m)   Wt 83 kg   SpO2 96%   BMI 24.82 kg/m   Physical Exam Vitals and nursing note reviewed.  Constitutional:      General: He is not in acute distress.    Appearance: Normal appearance. He is well-developed.  HENT:     Head: Normocephalic and atraumatic.  Eyes:     Conjunctiva/sclera: Conjunctivae normal.  Cardiovascular:     Rate and Rhythm: Normal rate. Rhythm irregular.     Heart sounds: Normal heart sounds. No murmur heard.   Pulmonary:     Effort: Pulmonary effort is normal.     Breath sounds: Normal breath sounds. No wheezing, rhonchi or rales.  Abdominal:     General: Bowel sounds are normal.     Palpations: Abdomen is soft.     Tenderness: There is no abdominal tenderness.  Musculoskeletal:        General: Normal range of motion.     Cervical back: Normal range of motion.  Skin:    General: Skin is warm and dry.  Neurological:     General:  No focal deficit present.     Mental Status: He is alert and oriented to person, place, and time.  Psychiatric:        Mood and Affect: Mood normal.     ED Results / Procedures / Treatments   Labs (all labs ordered are listed, but only abnormal results are displayed) Labs Reviewed  BASIC METABOLIC PANEL - Abnormal; Notable for the following components:      Result Value   Sodium 133 (*)    Calcium 8.8 (*)    All other components within normal limits  CBC  MAGNESIUM  TROPONIN I (HIGH SENSITIVITY)    EKG EKG Interpretation  Date/Time:  Monday July 18 2020 10:17:23 EDT Ventricular Rate:  77 PR Interval:    QRS Duration: 90 QT Interval:  356 QTC Calculation: 402 R Axis:   78 Text Interpretation: Atrial fibrillation Abnormal ECG No significant change since 10/13/2019 Confirmed by Veryl Speak [phone]) on 07/18/2020 10:35:09 AM   Radiology DG Chest 2 View  Result Date: 07/18/2020 CLINICAL DATA:  Elevated heart rate. History of atrial fibrillation. EXAM: CHEST - 2 VIEW COMPARISON:  PA and lateral chest 10/13/2019.  CT chest 06/11/2019. FINDINGS: Minimal atelectasis is seen in the left lung base. Lungs otherwise clear. Heart size normal. Aortic atherosclerosis. No pneumothorax or pleural effusion. No acute bony abnormality. Postoperative change both shoulders noted. IMPRESSION: No acute disease. Aortic Atherosclerosis (ICD10-I70.0). Electronically Signed   By: Inge Rise M.D.   On: 07/18/2020 11:05    Procedures Procedures (including critical care time)  Medications Ordered in ED Medications - No data to display  ED Course  I have reviewed the triage vital signs and the nursing notes.  Pertinent labs & imaging results that were available during my care of the patient were reviewed by me and considered in my medical decision making (see chart for details).    MDM Rules/Calculators/A&P  Rate controlled atrial fibrillation, currently  asymptomatic. Pt is fully anti coagulated.  Discussed with Dr. Domenic Polite of cards who recommends close f/u with Dr. Harl Bowie but no changes in meds today.  He has had flecainamide in the past which was not tolerated.  Return precautions given pt in the interim, persistent rates greater than 110 or any lightheadedness, weakness, CP.  Pt seen by Dr. Stark Jock prior to dc home.  Final Clinical Impression(s) / ED Diagnoses Final diagnoses:  Longstanding persistent atrial fibrillation [facility])    Rx / DC Orders ED Discharge Orders    None       Landis Martins 07/18/20 1353    Veryl Speak, MD 07/18/20 1429

## 2020-07-18 NOTE — Discharge Instructions (Addendum)
Your lab tests, ekg and exam today are ok.  As long as your heart rate stays below 100 bpm, you do not need to be cardioverted as discussed.  We do recommend close followup with Dr. Harl Bowie as discussed - call for an office visit.  If you start developing heard rates greater than 100 feel weak or lightheaded, return here for a recheck.

## 2020-07-18 NOTE — Telephone Encounter (Signed)
Pt complaining of increased HR.   Please call [phone]   Thanks renee

## 2020-07-18 NOTE — Telephone Encounter (Signed)
[id number] 

## 2020-07-19 NOTE — Telephone Encounter (Signed)
I spoke with patient.He went to the ED yesterday to try and have a cardioversion but his HR was not fast enough they told him. He feels fine now and has f/u apt with Dr.Branch on 8/23

## 2020-07-25 ENCOUNTER — Encounter: Payer: Self-pay | Admitting: Cardiology

## 2020-07-25 ENCOUNTER — Ambulatory Visit: Payer: Medicare Other | Admitting: Cardiology

## 2020-07-25 ENCOUNTER — Telehealth: Payer: Self-pay | Admitting: Cardiology

## 2020-07-25 ENCOUNTER — Other Ambulatory Visit: Payer: Self-pay

## 2020-07-25 VITALS — BP 122/74 | HR 101 | Ht 72.0 in | Wt 192.4 lb

## 2020-07-25 DIAGNOSIS — I4891 Unspecified atrial fibrillation: Secondary | ICD-10-CM | POA: Diagnosis not present

## 2020-07-25 NOTE — Patient Instructions (Signed)
Medication Instructions:  Your physician recommends that you continue on your current medications as directed. Please refer to the Current Medication list given to you today.  *If you need a refill on your cardiac medications before your next appointment, please call your pharmacy*   Lab Work: NONE   If you have labs (blood work) drawn today and your tests are completely normal, you will receive your results only by: Marland Kitchen MyChart Message (if you have MyChart) OR . A paper copy in the mail If you have any lab test that is abnormal or we need to change your treatment, we will call you to review the results.   Testing/Procedures: Your physician has recommended that you wear an event monitor. Event monitors are medical devices that record the heart's electrical activity. Doctors most often [place] these monitors to diagnose arrhythmias. Arrhythmias are problems with the speed or rhythm of the heartbeat. The monitor is a small, portable device. You can wear one while you do your normal daily activities. This is usually used to diagnose what is causing palpitations/syncope (passing out). 7 Day Zio     Follow-Up: At [facility], you and your health needs are our priority.  As part of our continuing mission to provide you with exceptional heart care, we have created designated Provider Care Teams.  These Care Teams include your primary Cardiologist (physician) and Advanced Practice Providers (APPs -  Physician Assistants and Nurse Practitioners) who all work together to provide you with the care you need, when you need it.  We recommend signing up for the patient portal called "MyChart".  Sign up information is provided on this After Visit Summary.  MyChart is used to connect with patients for Virtual Visits (Telemedicine).  Patients are able to view lab/test results, encounter notes, upcoming appointments, etc.  Non-urgent messages can be sent to your provider as well.   To learn more about what you can  do with MyChart, go to [email or web address].    Your next appointment:   2 month(s)  The format for your next appointment:   In Person  Provider:   Carlyle Dolly, MD   Other Instructions Thank you for choosing [facility]!

## 2020-07-25 NOTE — Telephone Encounter (Signed)
I Rhythm called to notify office that the Pt is in A-fib ranging from 75-105 BPM. Avg of 89 bpm. Please advise.

## 2020-07-25 NOTE — Telephone Encounter (Signed)
Abnormal finding

## 2020-07-25 NOTE — Progress Notes (Signed)
Clinical Summary Sean Miranda is a 86 y.o.male seen today for follow up of the following medical problem. This is a focused visit on recent issues with afib   1. Afib - appears diagnosed around 01/2017 during admission. Started on eliquis and lopressor 12.5mg  bid. Due to continued dyspnea attempts were made to get him back into NSR, including multiple cardioversions and starting flecanide. Had tolerated lopressor at the time but stopped taking due to HRs in 40s or 50s, had been on 12.5mg  bid at the time.  - was on flecanide previously, discontued after diagnosed with CAD  We tried lopressor 12.5mg  bid last visit but caused fatigue and he stopped taking. Heart rates checked multiple times a day, usually 50s-60s.  - - no recent palpitations - no bleeding on eliquis.    - seen in ER with palpitations and elevated HR to 90s - EKG showed rate controlled afib  HRs at home variable, 80s to 100s. Higher than his normal of 60s at rest.  - some fluttering at times. Some fatigue.    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    Past Medical History:  Diagnosis Date  . Arthritis    neck   . Atrial fibrillation ([place])    a. new-onset in 01/2017. Started on Eliquis  . CAD (coronary artery disease), native coronary artery 05/16/2017   LAD 95%>>0 w/ 2.5 x 16 mm Synergy DES, D2 40%, RCA 50%, EF 55-65%, LVEDP mod elevated  . Calcification of coronary artery    a. mild, nonobstructive CAD by cath in 2009.  Marland Kitchen Cancer of skin of chest   . Carotid artery occlusion    a. s/p R CEA in 2014  . Colon polyps   . Hyperlipidemia    takes Pravastatin every other day  . Pneumonia ~ 01/2013     Allergies  Allergen Reactions  . Lipitor [Atorvastatin Calcium] Other (See Comments)    Muscle pain  . Vantin [Cefpodoxime] Rash     Current  Outpatient Medications  Medication Sig Dispense Refill  . ALPHA LIPOIC ACID PO Take 300 mg by mouth daily.    . Cholecalciferol (VITAMIN D3) 2000 UNITS capsule Take 2,000 Units by mouth daily.     . Coenzyme Q10-Levocarnitine 100-20 MG CAPS Take 1 capsule by mouth daily.     Marland Kitchen ELIQUIS 5 MG TABS tablet TAKE (1) TABLET BY MOUTH TWICE DAILY. 180 tablet 1  . Glucosamine-Chondroitin 500-400 MG CAPS Take 1 capsule by mouth daily.     . Magnesium 500 MG TABS Take 1 tablet by mouth daily.    . Multiple Vitamin (MULTIVITAMIN) tablet Take 0.5 tablets by mouth daily.     . nitroGLYCERIN (NITROSTAT) 0.4 MG SL tablet Place 1 tablet (0.4 mg total) under the tongue every 5 (five) minutes as needed for chest pain. 30 tablet 0  . OMEGA-3 1000 MG CAPS Take 4,000 mg by mouth daily.    . pravastatin (PRAVACHOL) 40 MG tablet TAKE 1/2 TABLET BY MOUTH EVERY MONDAY, WEDNESDAY AND FRIDAY. 36 tablet 3  . Probiotic Product (PROBIOTIC DAILY PO) Take 1 capsule by mouth daily.    . sildenafil (REVATIO) 20 MG tablet TAKE 2 TO 3 TABLETS BY MOUTH AS NEEDED FOR SEXUAL ACTIVITY 60 tablet 4   No current facility-administered medications for this visit.  Past Surgical History:  Procedure Laterality Date  . CARDIAC CATHETERIZATION  06/09/1999   mild CAD (LAD, diagonal, RCA) - Dr. Loni Muse. [place]  . CARDIOVERSION N/A 03/25/2017   Procedure: CARDIOVERSION;  Surgeon: Pixie Casino, MD;  Location[facility] ENDOSCOPY;  Service: Cardiovascular;  Laterality: N/A;  . CAROTID DOPPLER  02/2012   60-79% RICA stenosis, 33-29% LICA stenosis (prior to carotid endarterectomy)  . COLONOSCOPY    . CORONARY STENT INTERVENTION N/A 05/16/2017   Procedure: Coronary Stent Intervention;  Surgeon: Jettie Booze, MD;  Location: [place] CV LAB;  Service: Cardiovascular;  Laterality: N/A;  . ENDARTERECTOMY Right 11/10/2013   Procedure: ENDARTERECTOMY CAROTID;  Surgeon: Conrad Sherrill, MD;  Location: [place];  Service: Vascular;  Laterality: Right;  .  INGUINAL HERNIA REPAIR Left   . LEFT HEART CATH AND CORONARY ANGIOGRAPHY N/A 05/16/2017   Procedure: Left Heart Cath and Coronary Angiography;  Surgeon: Jettie Booze, MD;  Location: [place] CV LAB;  Service: Cardiovascular;  Laterality: N/A;  . MOLE REMOVAL  1990s   "chest"  . NM MYOCAR PERF WALL MOTION  04/2011   bruce myoview - perfusion defect in inferior myocardium (diaphragmatic attenuation), remaining myocardium with normal perfusion, EF 67%  . PATCH ANGIOPLASTY Right 11/10/2013   Procedure: PATCH ANGIOPLASTY;  Surgeon: Conrad Hull, MD;  Location: [place];  Service: Vascular;  Laterality: Right;  . PILONIDAL CYST DRAINAGE    . SHOULDER OPEN ROTATOR CUFF REPAIR Bilateral 1998 - 2001  . SKIN CANCER EXCISION  ~ 2015   "chest"  . TONSILLECTOMY       Allergies  Allergen Reactions  . Lipitor [Atorvastatin Calcium] Other (See Comments)    Muscle pain  . Vantin [Cefpodoxime] Rash      Family History  Problem Relation Age of Onset  . Stroke Father   . Hypertension Mother   . Prostate cancer Son   . Stroke Brother   . Cancer Brother        spine     Social History Sean Miranda reports that he quit smoking about 47 years ago. His smoking use included cigarettes. He has a 18.00 pack-year smoking history. He has never used smokeless tobacco. Sean Miranda reports current alcohol use of about 14.0 standard drinks of alcohol per week.   Review of Systems CONSTITUTIONAL: No weight loss, fever, chills, weakness or fatigue.  HEENT: Eyes: No visual loss, blurred vision, double vision or yellow sclerae.No hearing loss, sneezing, congestion, runny nose or sore throat.  SKIN: No rash or itching.  CARDIOVASCULAR: per hpi RESPIRATORY: No shortness of breath, cough or sputum.  GASTROINTESTINAL: No anorexia, nausea, vomiting or diarrhea. No abdominal pain or blood.  GENITOURINARY: No burning on urination, no polyuria NEUROLOGICAL: No headache, dizziness, syncope, paralysis, ataxia,  numbness or tingling in the extremities. No change in bowel or bladder control.  MUSCULOSKELETAL: No muscle, back pain, joint pain or stiffness.  LYMPHATICS: No enlarged nodes. No history of splenectomy.  PSYCHIATRIC: No history of depression or anxiety.  ENDOCRINOLOGIC: No reports of sweating, cold or heat intolerance. No polyuria or polydipsia.  Marland Kitchen   Physical Examination Today's Vitals   07/25/20 0834  BP: 122/74  Pulse: (!) 101  SpO2: 94%  Weight: 192 lb 6.4 oz (87.3 kg)  Height: 6' (1.829 m)   Body mass index is 26.09 kg/m.  Gen: resting comfortably, no acute distress HEENT: no scleral icterus, pupils equal round and reactive, no palptable cervical adenopathy,  CV: irreg, no m/r/g, no  jvd Resp: Clear to auscultation bilaterally GI: abdomen is soft, non-tender, non-distended, normal bowel sounds, no hepatosplenomegaly MSK: extremities are warm, no edema.  Skin: warm, no rash Neuro:  no focal deficits Psych: appropriate affect   Diagnostic Studies 05/2017 cath   Mid RCA lesion, 50 %stenosed.  Ost RCA lesion, 25 %stenosed. Lesion noted in small RV marginal.  LM lesion, 25 %stenosed.  2nd Diag lesion, 40 %stenosed.  Mid LAD-1 lesion, 20 %stenosed.  Mid LAD-2 lesion, 95 %stenosed.  A STENT SYNERGY DES 2.5X16 drug eluting stent was successfully placed, postdilated to 3.0 mm.  Post intervention, there is a 0% residual stenosis.  The left ventricular systolic function is normal.  LV end diastolic pressure is moderately elevated.  The left ventricular ejection fraction is 55-65% by visual estimate.  There is no aortic valve stenosis.  LAD was the culprit for his unstable angina. He had symptoms during the procedure at rest. These resolved after stent placement. Watch overnight. Continue IV Angiomax for another hour.  Loaded with Plavix today. We'll start 75 mg daily tomorrow. Given aspirin today as well. Would plan for aspirin, Plavix and Eliquis for 30 days.  After 30 days, would stop the aspirin and continue Plavix and Eliquis. A synergy drug-eluting stent was used since he is on long-term anticoagulation. If there is a bleeding complication, his antiplatelet therapy could be stopped sooner than usual. Ideally, he would get 6-12 months of Plavix depending on his bleeding risk profile.  Possible discharge tomorrow if he does well tonight and with cardiac rehabilitation tomorrow.   05/2017 echo Study Conclusions  - Left ventricle: The cavity size was normal. Wall thickness was increased in a pattern of mild LVH. Systolic function was normal. The estimated ejection fraction was in the range of 60% to 65%. Wall motion was normal; there were no regional wall motion abnormalities. The study is not technically sufficient to allow evaluation of LV diastolic function. - Aortic valve: Mildly calcified annulus. Trileaflet; moderately thickened leaflets. Valve area (VTI): 2.98 cm^2. Valve area (Vmax): 2.7 cm^2. Valve area (Vmean): 2.56 cm^2. - Left atrium: The atrium was mildly dilated. - Technically adequate study.   10/2019 nuclear stress  Unable to achieve adequate heart rate response with GXT, Lexiscan utilized. No diagnostic ST segment changes.  Small, mild intensity, inferior/inferoseptal defect extending from apex to base that is fixed (actually more prominent at rest) and consistent with soft tissue attenuation. No reversible defects to indicate ischemia.  This is a low risk study.  Nuclear stress EF: 69%.     Assessment and Plan  1. Afib - recent increase in heart rates by home monitoring, some reported palpitations. Some genrealized fatigue unclear if relate - will obtain 7 day event monitor to further evaluate.  - did not tolerate even low dose av nodal agents in the past due to fatigue and bradycardia - CAD limits antiarrhythmic strategies.  - if ongoing issues with afib would need to refer to  EP      Arnoldo Lenis, M.D.

## 2020-07-26 DIAGNOSIS — I482 Chronic atrial fibrillation, unspecified: Secondary | ICD-10-CM | POA: Diagnosis not present

## 2020-07-26 NOTE — Telephone Encounter (Signed)
Continue monitor, no changes   Zandra Abts MD

## 2020-08-09 ENCOUNTER — Ambulatory Visit (INDEPENDENT_AMBULATORY_CARE_PROVIDER_SITE_OTHER): Payer: Medicare Other

## 2020-08-09 ENCOUNTER — Other Ambulatory Visit: Payer: Self-pay

## 2020-08-09 DIAGNOSIS — I4891 Unspecified atrial fibrillation: Secondary | ICD-10-CM | POA: Diagnosis not present

## 2020-08-09 NOTE — Addendum Note (Signed)
Addended by: Levonne Hubert on: 08/09/2020 08:49 AM   Modules accepted: Orders

## 2020-08-16 ENCOUNTER — Telehealth: Payer: Self-pay | Admitting: Cardiology

## 2020-08-16 DIAGNOSIS — H25813 Combined forms of age-related cataract, bilateral: Secondary | ICD-10-CM | POA: Diagnosis not present

## 2020-08-16 DIAGNOSIS — H40013 Open angle with borderline findings, low risk, bilateral: Secondary | ICD-10-CM | POA: Diagnosis not present

## 2020-08-16 DIAGNOSIS — H40022 Open angle with borderline findings, high risk, left eye: Secondary | ICD-10-CM | POA: Diagnosis not present

## 2020-08-16 DIAGNOSIS — H40011 Open angle with borderline findings, low risk, right eye: Secondary | ICD-10-CM | POA: Diagnosis not present

## 2020-08-16 NOTE — Telephone Encounter (Signed)
Monitor results given to patient.Mr.Flett states other than feeling tired, he has no complaints of palpitations.

## 2020-08-16 NOTE — Telephone Encounter (Signed)
Patient called again wanting the monitor results

## 2020-08-16 NOTE — Telephone Encounter (Signed)
Pt requesting monitor results. Monitor mailed back 2 weeks ago.    Please call [phone]   Thanks renee

## 2020-08-17 NOTE — Telephone Encounter (Signed)
Its difficult to assess just generalized fatigue to his afib, particularly if his rates are doing ok. To try to get him out of afib would involve the use of more complex medications and unclear if that is warranted at this time. Does he have any specifici shortness of breath with activities? When he says fatigue is he just feeling tired and drained, or is he specifically referring to shortness of breath particularly with activities   Zandra Abts MD

## 2020-08-18 NOTE — Telephone Encounter (Signed)
I would follow up with his pcp for generalized fatigue. That is not a symptom typically attributable to afib. Afib can cause SOB particualrly with activity, would not cause just a generalized fatigue throughout the day generally. To try to get him out of afib would requrie complex treatments with some higher risks of side effects and does not appear indicated at this time to go that route  Carlyle Dolly MD

## 2020-08-18 NOTE — Telephone Encounter (Signed)
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

## 2020-08-18 NOTE — Telephone Encounter (Signed)
I spoke with Mr.Knoedler. He will follow up with Dr.Fagan.

## 2020-08-22 ENCOUNTER — Telehealth: Payer: Self-pay | Admitting: Cardiology

## 2020-08-22 DIAGNOSIS — I4891 Unspecified atrial fibrillation: Secondary | ICD-10-CM

## 2020-08-22 NOTE — Telephone Encounter (Signed)
Returned pt call. No answer. Left msg to call back.  

## 2020-08-22 NOTE — Telephone Encounter (Signed)
Pt and wife report that pt is fatigue and does not feel well. Pt states that he feels that he is back in Afib. He denies chest pain at this time. States that this has been going on since Thursday. BP 134/76 and HR 90-101. Pt scheduled to see Cecilie Kicks, NP tomorrow at 1:30 pm. Please advise.

## 2020-08-22 NOTE — Telephone Encounter (Signed)
New message   Patient believes he is in AFIB, he is very fatigued and not feeling well , patient is going for Covid test

## 2020-08-23 ENCOUNTER — Other Ambulatory Visit: Payer: Self-pay

## 2020-08-23 ENCOUNTER — Ambulatory Visit: Payer: Medicare Other | Admitting: Cardiology

## 2020-08-23 ENCOUNTER — Encounter: Payer: Self-pay | Admitting: Cardiology

## 2020-08-23 VITALS — BP 140/68 | HR 88 | Ht 72.0 in | Wt 192.8 lb

## 2020-08-23 DIAGNOSIS — E782 Mixed hyperlipidemia: Secondary | ICD-10-CM

## 2020-08-23 DIAGNOSIS — I4819 Other persistent atrial fibrillation: Secondary | ICD-10-CM

## 2020-08-23 DIAGNOSIS — Z7901 Long term (current) use of anticoagulants: Secondary | ICD-10-CM

## 2020-08-23 DIAGNOSIS — I6523 Occlusion and stenosis of bilateral carotid arteries: Secondary | ICD-10-CM | POA: Diagnosis not present

## 2020-08-23 DIAGNOSIS — I2510 Atherosclerotic heart disease of native coronary artery without angina pectoris: Secondary | ICD-10-CM | POA: Diagnosis not present

## 2020-08-23 NOTE — Patient Instructions (Signed)
Medication Instructions:  ?Your physician recommends that you continue on your current medications as directed. Please refer to the Current Medication list given to you today. ? ?*If you need a refill on your cardiac medications before your next appointment, please call your pharmacy* ? ? ?Lab Work: ?NONE  ? ?If you have labs (blood work) drawn today and your tests are completely normal, you will receive your results only by: ?MyChart Message (if you have MyChart) OR ?A paper copy in the mail ?If you have any lab test that is abnormal or we need to change your treatment, we will call you to review the results. ? ? ?Testing/Procedures: ?Your physician has requested that you have an echocardiogram. Echocardiography is a painless test that uses sound waves to create images of your heart. It provides your doctor with information about the size and shape of your heart and how well your heart?s chambers and valves are working. This procedure takes approximately one hour. There are no restrictions for this procedure. ? ? ? ?Follow-Up: ?At [facility], you and your health needs are our priority.  As part of our continuing mission to provide you with exceptional heart care, we have created designated Provider Care Teams.  These Care Teams include your primary Cardiologist (physician) and Advanced Practice Providers (APPs -  Physician Assistants and Nurse Practitioners) who all work together to provide you with the care you need, when you need it. ? ?We recommend signing up for the patient portal called "MyChart".  Sign up information is provided on this After Visit Summary.  MyChart is used to connect with patients for Virtual Visits (Telemedicine).  Patients are able to view lab/test results, encounter notes, upcoming appointments, etc.  Non-urgent messages can be sent to your provider as well.   ?To learn more about what you can do with MyChart, go to [email or web address].   ? ?Your next appointment:   ?3  month(s) ? ?The format for your next appointment:   ?In Person ? ?Provider:   ?Jonathan Branch, MD  ? ? ?Other Instructions ?Thank you for choosing [facility]! ? ? ? ?

## 2020-08-23 NOTE — Progress Notes (Signed)
Cardiology Office Note   Date:  08/23/2020   ID:  Sean Miranda, DOB [birth year], MRN [id number]  PCP:  Asencion Noble, MD  Cardiologist:  Dr. Harl Bowie     Chief Complaint  Patient presents with  . Atrial Fibrillation  . Fatigue      History of Present Illness: Sean Miranda is a 86 y.o. male who presents for feeling weak and thinks he is back in a fib.    Pt with a hx of a fib - appears diagnosed around 01/2017 during admission. Started on eliquis and lopressor 12.5mg  bid. Due to continued dyspnea attempts were made to get him back into NSR, including multiple cardioversions and starting flecanide. Had tolerated lopressor at the time but stopped taking due to HRs in 40s or 50s, had been on 12.5mg  bid at the time.  - was on flecanide previously, discontued after diagnosed with CAD We tried lopressor 12.5mg  bid last visit but caused fatigue and he stopped taking. Heart rates checked multiple times a day, usually 50s-60s.  - seen in ER 07/18/20  with palpitations and elevated HR to 90s - EKG showed rate controlled afib  HRs at home variable, 80s to 100s. Higher than his normal of 60s at rest.  - some fluttering at times. Some fatigue. Event monitor 7 day  with a fib persistent but HR was controlled.  Max HR 131, min HR 53, average 81 Now with fatigue.  Some SOB.  The most discomfort is when he goes up hill.   Per Dr. Harl Bowie -if ongoing a fib then refer to EP   CAD with with stent to LAD 05/2017 otherwise nonobstructive CAD.    Today no chest pain.  SOB as above with walking up hill.  + fatigue increased over last few days.  No known COVID exposure. No fevers.  No loss of taste or smell.  Pt is very hard of hearing and his wife is helpful - hearing aids have died.      Past Medical History:  Diagnosis Date  . Arthritis    neck   . Atrial fibrillation ([place])    a. new-onset in 01/2017. Started on Eliquis  . CAD (coronary artery disease), native coronary artery 05/16/2017    LAD 95%>>0 w/ 2.5 x 16 mm Synergy DES, D2 40%, RCA 50%, EF 55-65%, LVEDP mod elevated  . Calcification of coronary artery    a. mild, nonobstructive CAD by cath in 2009.  Marland Kitchen Cancer of skin of chest   . Carotid artery occlusion    a. s/p R CEA in 2014  . Colon polyps   . Hyperlipidemia    takes Pravastatin every other day  . Pneumonia ~ 01/2013    Past Surgical History:  Procedure Laterality Date  . CARDIAC CATHETERIZATION  06/09/1999   mild CAD (LAD, diagonal, RCA) - Dr. Loni Muse. [place]  . CARDIOVERSION N/A 03/25/2017   Procedure: CARDIOVERSION;  Surgeon: Pixie Casino, MD;  Location: [facility] ENDOSCOPY;  Service: Cardiovascular;  Laterality: N/A;  . CAROTID DOPPLER  02/2012   60-79% RICA stenosis, 59-56% LICA stenosis (prior to carotid endarterectomy)  . COLONOSCOPY    . CORONARY STENT INTERVENTION N/A 05/16/2017   Procedure: Coronary Stent Intervention;  Surgeon: Jettie Booze, MD;  Location: [place] [facility];  Service: Cardiovascular;  Laterality: N/A;  . ENDARTERECTOMY Right 11/10/2013   Procedure: ENDARTERECTOMY CAROTID;  Surgeon: Conrad Cusick, MD;  Location: [place];  Service: Vascular;  Laterality: Right;  . INGUINAL  HERNIA REPAIR Left   . LEFT HEART CATH AND CORONARY ANGIOGRAPHY N/A 05/16/2017   Procedure: Left Heart Cath and Coronary Angiography;  Surgeon: Jettie Booze, MD;  Location: [place] [facility];  Service: Cardiovascular;  Laterality: N/A;  . MOLE REMOVAL  1990s   "chest"  . NM MYOCAR PERF WALL MOTION  04/2011   bruce myoview - perfusion defect in inferior myocardium (diaphragmatic attenuation), remaining myocardium with normal perfusion, EF 67%  . PATCH ANGIOPLASTY Right 11/10/2013   Procedure: PATCH ANGIOPLASTY;  Surgeon: Conrad Bates, MD;  Location: [place];  Service: Vascular;  Laterality: Right;  . PILONIDAL CYST DRAINAGE    . SHOULDER OPEN ROTATOR CUFF REPAIR Bilateral 1998 - 2001  . SKIN CANCER EXCISION  ~ 2015   "chest"  . TONSILLECTOMY       Current  Outpatient Medications  Medication Sig Dispense Refill  . ALPHA LIPOIC ACID PO Take 300 mg by mouth daily.    . Cholecalciferol (VITAMIN D3) 2000 UNITS capsule Take 2,000 Units by mouth daily.     . Coenzyme Q10-Levocarnitine 100-20 MG CAPS Take 1 capsule by mouth daily.     Marland Kitchen ELIQUIS 5 MG TABS tablet TAKE (1) TABLET BY MOUTH TWICE DAILY. 180 tablet 1  . Glucosamine-Chondroitin 500-400 MG CAPS Take 1 capsule by mouth daily.     . Magnesium 500 MG TABS Take 1 tablet by mouth daily.    . Multiple Vitamin (MULTIVITAMIN) tablet Take 0.5 tablets by mouth daily.     . nitroGLYCERIN (NITROSTAT) 0.4 MG SL tablet Place 1 tablet (0.4 mg total) under the tongue every 5 (five) minutes as needed for chest pain. 30 tablet 0  . OMEGA-3 1000 MG CAPS Take 4,000 mg by mouth daily.    . pravastatin (PRAVACHOL) 40 MG tablet TAKE 1/2 TABLET BY MOUTH EVERY MONDAY, WEDNESDAY AND FRIDAY. 36 tablet 3  . Probiotic Product (PROBIOTIC DAILY PO) Take 1 capsule by mouth daily.    . sildenafil (REVATIO) 20 MG tablet TAKE 2 TO 3 TABLETS BY MOUTH AS NEEDED FOR SEXUAL ACTIVITY 60 tablet 4   No current facility-administered medications for this visit.    Allergies:   Lipitor [atorvastatin calcium] and Vantin [cefpodoxime]    Social History:  The patient  reports that he quit smoking about 47 years ago. His smoking use included cigarettes. He has a 18.00 pack-year smoking history. He has never used smokeless tobacco. He reports current alcohol use of about 14.0 standard drinks of alcohol per week. He reports that he does not use drugs.   Family History:  The patient's family history includes Cancer in his brother; Hypertension in his mother; Prostate cancer in his son; Stroke in his brother and father.    ROS:  General:no colds or fevers, some weight gain Skin:no rashes or ulcers HEENT:no blurred vision, no congestion CV:see HPI PUL:see HPI GI:no diarrhea constipation or melena, no indigestion GU:no hematuria, no  dysuria MS:no joint pain, no claudication Neuro:no syncope, no lightheadedness Endo:no diabetes, no thyroid disease  Wt Readings from Last 3 Encounters:  08/23/20 192 lb 12.8 oz (87.5 kg)  07/25/20 192 lb 6.4 oz (87.3 kg)  07/18/20 183 lb (83 kg)     PHYSICAL EXAM: VS:  BP 140/68   Pulse 88   Ht 6' (1.829 m)   Wt 192 lb 12.8 oz (87.5 kg)   SpO2 97%   BMI 26.15 kg/m  , BMI Body mass index is 26.15 kg/m. General:Pleasant affect, NAD Skin:Warm and  dry, brisk capillary refill HEENT:normocephalic, sclera clear, mucus membranes moist Neck:supple, no JVD, no bruits  Heart:S1S2 RRR without murmur, gallup, rub or click Lungs:clear without rales, rhonchi, or wheezes KDX:IPJA, non tender, + BS, do not palpate liver spleen or masses Ext:no lower ext edema, 2+ pedal pulses, 2+ radial pulses Neuro:alert and oriented X 3, MAE, follows commands, + facial symmetry    EKG:  EKG is ordered today. The ekg ordered today demonstrates atrial fib rate of 83 no ST changes.     Recent Labs: 01/28/2020: ALT 16; TSH 2.139 07/18/2020: BUN 20; Creatinine, Ser 0.98; Hemoglobin 14.5; Magnesium 2.3; Platelets 201; Potassium 4.9; Sodium 133    Lipid Panel    Component Value Date/Time   CHOL 197 01/28/2020 0819   CHOL 243 10/07/2019 0000   TRIG 71 01/28/2020 0819   TRIG 86 10/07/2019 0000   HDL 45 01/28/2020 0819   HDL 44 10/07/2019 0000   CHOLHDL 4.4 01/28/2020 0819   VLDL 14 01/28/2020 0819   LDLCALC 138 (H) 01/28/2020 0819   LDLDIRECT 149.3 01/05/2014 0940       Other studies Reviewed: Additional studies/ records that were reviewed today include: .  05/2017 cath   Mid RCA lesion, 50 %stenosed.  Ost RCA lesion, 25 %stenosed. Lesion noted in small RV marginal.  LM lesion, 25 %stenosed.  2nd Diag lesion, 40 %stenosed.  Mid LAD-1 lesion, 20 %stenosed.  Mid LAD-2 lesion, 95 %stenosed.  A STENT SYNERGY DES [id number] drug eluting stent was successfully placed, postdilated to 3.0  mm.  Post intervention, there is a 0% residual stenosis.  The left ventricular systolic function is normal.  LV end diastolic pressure is moderately elevated.  The left ventricular ejection fraction is 55-65% by visual estimate.  There is no aortic valve stenosis.  LAD was the culprit for his unstable angina. He had symptoms during the procedure at rest. These resolved after stent placement. Watch overnight. Continue IV Angiomax for another hour.  Loaded with Plavix today. We'll start 75 mg daily tomorrow. Given aspirin today as well. Would plan for aspirin, Plavix and Eliquis for 30 days. After 30 days, would stop the aspirin and continue Plavix and Eliquis. A synergy drug-eluting stent was used since he is on long-term anticoagulation. If there is a bleeding complication, his antiplatelet therapy could be stopped sooner than usual. Ideally, he would get 6-12 months of Plavix depending on his bleeding risk profile.  05/2017 echo Study Conclusions  - Left ventricle: The cavity size was normal. Wall thickness was increased in a pattern of mild LVH. Systolic function was normal. The estimated ejection fraction was in the range of 60% to 65%. Wall motion was normal; there were no regional wall motion abnormalities. The study is not technically sufficient to allow evaluation of LV diastolic function. - Aortic valve: Mildly calcified annulus. Trileaflet; moderately thickened leaflets. Valve area (VTI): 2.98 cm^2. Valve area (Vmax): 2.7 cm^2. Valve area (Vmean): 2.56 cm^2. - Left atrium: The atrium was mildly dilated. - Technically adequate study.   10/2019 nuclear stress  Unable to achieve adequate heart rate response with GXT, Lexiscan utilized. No diagnostic ST segment changes.  Small, mild intensity, inferior/inferoseptal defect extending from apex to base that is fixed (actually more prominent at rest) and consistent with soft tissue attenuation. No reversible  defects to indicate ischemia.  This is a low risk study.  Nuclear stress EF: 69%.   ASSESSMENT AND PLAN:  1.  Increased fatigue - not sure this is all related  to a fib due to being in a fib longer than symptoms.  No chest pain.  SOB when walking up hill.  Pt is a young 87 yr old.    2.  Atrial fib and in past brady with lopressor or increased fatigue.  I am hesitant to add cardizem now will check echo and plan for him to be seen in a fib clinic on Monday.  We discussed antiarrythmic medication or ablation.    3.  anticoagulation with CHA2DS2VASc of 3 on eliquis, has been on for some time.  instructed on importance of not missing doses if DCCV is planned  4.  CAD with hx of DES to LAD 05/2017 otherwise he had nonobstructive CAD. No angina  5  Carotid stenosis with bilateral 1-39% stenosis   6.  HLD on pravastatin  3 X per week due to hx of muscle pain on lipitor and last LDL 138  Will schedule with Dr. Harl Bowie in 3 months to allow a fib clinic to decide on a fib treatment.   Current medicines are reviewed with the patient today.  The patient Has no concerns regarding medicines.  The following changes have been made:  See above Labs/ tests ordered today include:see above  Disposition:   FU:  see above  Signed, Cecilie Kicks, NP  08/23/2020 4:49 PM    [facility] [place], [place], [place], [place] Phone: [phone]; Fax: [phone]

## 2020-08-23 NOTE — Telephone Encounter (Signed)
°  Referral placed to A-fib clinic.

## 2020-08-23 NOTE — Telephone Encounter (Signed)
Can keep that appt, can we go ahead and refer him to EP. If ok going to [facility] could do afib clinic which may have a sooner appt, if prefers [place] then Dr Dionisio David MD

## 2020-08-29 ENCOUNTER — Encounter (HOSPITAL_COMMUNITY): Payer: Self-pay | Admitting: Physician Assistant

## 2020-08-29 ENCOUNTER — Other Ambulatory Visit: Payer: Self-pay

## 2020-08-29 ENCOUNTER — Ambulatory Visit (HOSPITAL_COMMUNITY)
Admission: RE | Admit: 2020-08-29 | Discharge: 2020-08-29 | Disposition: A | Discharge status: Home / Self Care | Payer: Medicare Other | Source: Ambulatory Visit | Attending: Physician Assistant | Admitting: Physician Assistant

## 2020-08-29 VITALS — BP 134/80 | HR 93 | Ht 72.0 in | Wt 192.0 lb

## 2020-08-29 DIAGNOSIS — Z7901 Long term (current) use of anticoagulants: Secondary | ICD-10-CM | POA: Diagnosis not present

## 2020-08-29 DIAGNOSIS — Z79899 Other long term (current) drug therapy: Secondary | ICD-10-CM | POA: Insufficient documentation

## 2020-08-29 DIAGNOSIS — Z8249 Family history of ischemic heart disease and other diseases of the circulatory system: Secondary | ICD-10-CM | POA: Diagnosis not present

## 2020-08-29 DIAGNOSIS — D6869 Other thrombophilia: Secondary | ICD-10-CM | POA: Insufficient documentation

## 2020-08-29 DIAGNOSIS — E785 Hyperlipidemia, unspecified: Secondary | ICD-10-CM | POA: Diagnosis not present

## 2020-08-29 DIAGNOSIS — Z87891 Personal history of nicotine dependence: Secondary | ICD-10-CM | POA: Diagnosis not present

## 2020-08-29 DIAGNOSIS — I2510 Atherosclerotic heart disease of native coronary artery without angina pectoris: Secondary | ICD-10-CM | POA: Diagnosis not present

## 2020-08-29 DIAGNOSIS — I4811 Longstanding persistent atrial fibrillation: Secondary | ICD-10-CM | POA: Insufficient documentation

## 2020-08-29 NOTE — Progress Notes (Signed)
Primary Care Physician: Asencion Noble, MD Primary Cardiologist: Dr Harl Bowie Primary Electrophysiologist: none Referring Physician: Stephanie Coup [facility] is a 86 y.o. male with a history of CAD, carotid artery disease, HLD, and longstanding persistent atrial fibrillation who presents for consultation in the [facility]. The patient was initially diagnosed with atrial fibrillation 01/2017 after presenting with symptoms of chest perssure. Patient is on Eliquis for a CHADS2VASC score of 3. Due to continued dyspnea attempts were made to get him back into NSR, including multiple cardioversions and starting flecanide. Had tolerated lopressor at the time but stopped taking due to HRs in 40s or 50s. Flecainide was discontinued when 2/2 CAD. Last ECG with SR was 02/07/19. Patient reports that he has had symptoms of increased fatigue over the last couple months. He denies CP, SOB, dizziness, or palpitations. He denies bleeding issues on anticoagulation.   Today, he denies symptoms of palpitations, chest pain, shortness of breath, orthopnea, PND, lower extremity edema, dizziness, presyncope, syncope, snoring, daytime somnolence, bleeding, or neurologic sequela. The patient is tolerating medications without difficulties and is otherwise without complaint today.    Atrial Fibrillation Risk Factors:  he does not have symptoms or diagnosis of sleep apnea. he does not have a history of rheumatic fever.   he has a BMI of Body mass index is 26.04 kg/m.Marland Kitchen Filed Weights   08/29/20 1435  Weight: 87.1 kg    Family History  Problem Relation Age of Onset  . Stroke Father   . Hypertension Mother   . Prostate cancer Son   . Stroke Brother   . Cancer Brother        spine     Atrial Fibrillation Management history:  Previous antiarrhythmic drugs: flecainide Previous cardioversions: 2018 Previous ablations: none CHADS2VASC score: 3 Anticoagulation history:  Eliquis   Past Medical History:  Diagnosis Date  . Arthritis    neck   . Atrial fibrillation ([place])    a. new-onset in 01/2017. Started on Eliquis  . CAD (coronary artery disease), native coronary artery 05/16/2017   LAD 95%>>0 w/ 2.5 x 16 mm Synergy DES, D2 40%, RCA 50%, EF 55-65%, LVEDP mod elevated  . Calcification of coronary artery    a. mild, nonobstructive CAD by cath in 2009.  Marland Kitchen Cancer of skin of chest   . Carotid artery occlusion    a. s/p R CEA in 2014  . Colon polyps   . Hyperlipidemia    takes Pravastatin every other day  . Pneumonia ~ 01/2013   Past Surgical History:  Procedure Laterality Date  . CARDIAC CATHETERIZATION  06/09/1999   mild CAD (LAD, diagonal, RCA) - Dr. Loni Muse. Little  . CARDIOVERSION N/A 03/25/2017   Procedure: CARDIOVERSION;  Surgeon: Pixie Casino, MD;  Location: [facility] ENDOSCOPY;  Service: Cardiovascular;  Laterality: N/A;  . CAROTID DOPPLER  02/2012   60-79% RICA stenosis, 25-42% LICA stenosis (prior to carotid endarterectomy)  . COLONOSCOPY    . CORONARY STENT INTERVENTION N/A 05/16/2017   Procedure: Coronary Stent Intervention;  Surgeon: Jettie Booze, MD;  Location: [place] [facility];  Service: Cardiovascular;  Laterality: N/A;  . ENDARTERECTOMY Right 11/10/2013   Procedure: ENDARTERECTOMY CAROTID;  Surgeon: Conrad , MD;  Location: [place];  Service: Vascular;  Laterality: Right;  . INGUINAL HERNIA REPAIR Left   . LEFT HEART CATH AND CORONARY ANGIOGRAPHY N/A 05/16/2017   Procedure: Left Heart Cath and Coronary Angiography;  Surgeon: Jettie Booze,  MD;  Location: [place] CV LAB;  Service: Cardiovascular;  Laterality: N/A;  . MOLE REMOVAL  1990s   "chest"  . NM MYOCAR PERF WALL MOTION  04/2011   bruce myoview - perfusion defect in inferior myocardium (diaphragmatic attenuation), remaining myocardium with normal perfusion, EF 67%  . PATCH ANGIOPLASTY Right 11/10/2013   Procedure: PATCH ANGIOPLASTY;  Surgeon: Conrad Seligman, MD;  Location:  [place];  Service: Vascular;  Laterality: Right;  . PILONIDAL CYST DRAINAGE    . SHOULDER OPEN ROTATOR CUFF REPAIR Bilateral 1998 - 2001  . SKIN CANCER EXCISION  ~ 2015   "chest"  . TONSILLECTOMY      Current Outpatient Medications  Medication Sig Dispense Refill  . ALPHA LIPOIC ACID PO Take 300 mg by mouth daily.    . Cholecalciferol (VITAMIN D3) 2000 UNITS capsule Take 2,000 Units by mouth daily.     . Coenzyme Q10-Levocarnitine 100-20 MG CAPS Take 1 capsule by mouth daily.     Marland Kitchen ELIQUIS 5 MG TABS tablet TAKE (1) TABLET BY MOUTH TWICE DAILY. 180 tablet 1  . Glucosamine-Chondroitin 500-400 MG CAPS Take 1 capsule by mouth daily.     . Magnesium 500 MG TABS Take 1 tablet by mouth daily.    . Multiple Vitamin (MULTIVITAMIN) tablet Take 0.5 tablets by mouth daily.     . nitroGLYCERIN (NITROSTAT) 0.4 MG SL tablet Place 1 tablet (0.4 mg total) under the tongue every 5 (five) minutes as needed for chest pain. 30 tablet 0  . OMEGA-3 1000 MG CAPS Take 4,000 mg by mouth daily.    . pravastatin (PRAVACHOL) 40 MG tablet TAKE 1/2 TABLET BY MOUTH EVERY MONDAY, WEDNESDAY AND FRIDAY. 36 tablet 3  . Probiotic Product (PROBIOTIC DAILY PO) Take 1 capsule by mouth daily.    . sildenafil (REVATIO) 20 MG tablet TAKE 2 TO 3 TABLETS BY MOUTH AS NEEDED FOR SEXUAL ACTIVITY 60 tablet 4   No current facility-administered medications for this encounter.    Allergies  Allergen Reactions  . Lipitor [Atorvastatin Calcium] Other (See Comments)    Muscle pain  . Vantin [Cefpodoxime] Rash    Social History   Socioeconomic History  . Marital status: Married    Spouse name: Not on file  . Number of children: 3  . Years of education: Not on file  . Highest education level: Not on file  Occupational History    Employer: [employer]  Tobacco Use  . Smoking status: Former Smoker    Packs/day: 1.00    Years: 18.00    Pack years: 18.00    Types: Cigarettes    Quit date: 12/03/1972    Years since quitting: 47.7   . Smokeless tobacco: Never Used  Vaping Use  . Vaping Use: Never used  Substance and Sexual Activity  . Alcohol use: Yes    Alcohol/week: 14.0 standard drinks    Types: 7 Glasses of wine, 7 Shots of liquor per week  . Drug use: No  . Sexual activity: Yes  Other Topics Concern  . Not on file  Social History Narrative  . Not on file   Social Determinants of Health   Financial Resource Strain:   . Difficulty of Paying Living Expenses: Not on file  Food Insecurity:   . Worried About Charity fundraiser in the Last Year: Not on file  . Ran Out of Food in the Last Year: Not on file  Transportation Needs:   . Lack of Transportation (Medical):  Not on file  . Lack of Transportation (Non-Medical): Not on file  Physical Activity:   . Days of Exercise per Week: Not on file  . Minutes of Exercise per Session: Not on file  Stress:   . Feeling of Stress : Not on file  Social Connections:   . Frequency of Communication with Friends and Family: Not on file  . Frequency of Social Gatherings with Friends and Family: Not on file  . Attends Religious Services: Not on file  . Active Member of Clubs or Organizations: Not on file  . Attends Archivist Meetings: Not on file  . Marital Status: Not on file  Intimate Partner Violence:   . Fear of Current or Ex-Partner: Not on file  . Emotionally Abused: Not on file  . Physically Abused: Not on file  . Sexually Abused: Not on file     ROS- All systems are reviewed and negative except as per the HPI above.  Physical Exam: Vitals:   08/29/20 1435  BP: 134/80  Pulse: 93  Weight: 87.1 kg  Height: 6' (1.829 m)    GEN- The patient is well appearing elderly male, alert and oriented x 3 today.   Head- normocephalic, atraumatic Eyes-  Sclera clear, conjunctiva pink Ears- hearing intact Oropharynx- clear Neck- supple  Lungs- Clear to ausculation bilaterally, normal work of breathing Heart- irregular rate and rhythm, no murmurs,  rubs or gallops  GI- soft, NT, ND, + BS Extremities- no clubbing, cyanosis, or edema MS- no significant deformity or atrophy Skin- no rash or lesion Psych- euthymic mood, full affect Neuro- strength and sensation are intact  Wt Readings from Last 3 Encounters:  08/29/20 87.1 kg  08/23/20 87.5 kg  07/25/20 87.3 kg    EKG today demonstrates afib HR 93, QRS 94, QTc 457  Echo 05/15/17 demonstrated  - Left ventricle: The cavity size was normal. Wall thickness was  increased in a pattern of mild LVH. Systolic function was normal.  The estimated ejection fraction was in the range of 60% to 65%.  Wall motion was normal; there were no regional wall motion  abnormalities. The study is not technically sufficient to allow  evaluation of LV diastolic function.  - Aortic valve: Mildly calcified annulus. Trileaflet; moderately  thickened leaflets. Valve area (VTI): 2.98 cm^2. Valve area  (Vmax): 2.7 cm^2. Valve area (Vmean): 2.56 cm^2.  - Left atrium: The atrium was mildly dilated.  - Technically adequate study.   Epic records are reviewed at length today  CHA2DS2-VASc Score = 3  The patient's score is based upon: CHF History: 0 HTN History: 0 Age : 2 Diabetes History: 0 Stroke History: 0 Vascular Disease History: 1 Gender: 0      ASSESSMENT AND PLAN: 1. Longstanding Persistent Atrial Fibrillation (ICD10:  I48.11) The patient's CHA2DS2-VASc score is 3, indicating a 3.2% annual risk of stroke.   Patient appears to have been in afib since at least 04/2019. We discussed therapeutic options today including rhythm control with AAD vs rate control. Unclear if his fatigue symptoms are 2/2 to his afib. Patient would like trial of SR. Will add him to list for possible dofetilide admission once COVID hospitalizations decrease.  Otherwise, will continue Eliquis 5 mg BID. Will not add AV nodal agents as he is rate controlled and not tolerated rate control in the past.   2. Secondary  Hypercoagulable State (ICD10:  D68.69) The patient is at significant risk for stroke/thromboembolism based upon his CHA2DS2-VASc Score of 3.  Continue Apixaban (Eliquis).   3. CAD No anginal symptoms.   Follow up with Dr Harl Bowie as scheduled. Follow up in AF clinic once dofetilide admissions are available.    [facility] [address] [place], [place] [address] [phone] 08/29/2020 3:59 PM

## 2020-08-30 ENCOUNTER — Ambulatory Visit: Payer: Medicare Other | Admitting: Family Medicine

## 2020-09-05 ENCOUNTER — Ambulatory Visit (HOSPITAL_COMMUNITY)
Admission: RE | Admit: 2020-09-05 | Discharge: 2020-09-05 | Disposition: A | Discharge status: Home / Self Care | Payer: Medicare Other | Source: Ambulatory Visit | Attending: Internal Medicine | Admitting: Internal Medicine

## 2020-09-05 ENCOUNTER — Other Ambulatory Visit: Payer: Self-pay

## 2020-09-05 DIAGNOSIS — I4819 Other persistent atrial fibrillation: Secondary | ICD-10-CM | POA: Insufficient documentation

## 2020-09-05 LAB — ECHOCARDIOGRAM COMPLETE
Area-P 1/2: 3.66 cm2
S' Lateral: 2.54 cm

## 2020-09-05 NOTE — Progress Notes (Signed)
*  PRELIMINARY RESULTS* Echocardiogram 2D Echocardiogram has been performed.  Sean Miranda 09/05/2020, 2:41 PM

## 2020-09-08 ENCOUNTER — Telehealth: Payer: Self-pay | Admitting: Cardiology

## 2020-09-08 NOTE — Telephone Encounter (Signed)
Pt states he would like to speak with someone about his heart skipping beats   Please call [phone]   Thanks renee

## 2020-09-08 NOTE — Telephone Encounter (Signed)
Spoke with pt who states he would like an appointment with Dr. Harl Bowie regarding getting rid of his A-Fib. Pt states that he spoke with a MD at [facility] and was told there is a medicine that he had to be admitted for 2 days. States that it making him tired. Please advise.

## 2020-09-12 ENCOUNTER — Telehealth: Payer: Self-pay | Admitting: Cardiology

## 2020-09-12 NOTE — Telephone Encounter (Signed)
NEW MESSAGE    PATIENT STATES HE IS IN AFIB , HAS BEEN IN AFIB FOR A MONTH AND WOULD LIKE TO DO SOMETHING ABOUT IT

## 2020-09-12 NOTE — Telephone Encounter (Signed)
We have already sent Dr.Branch a message regarding this.We await his response.

## 2020-09-13 NOTE — Telephone Encounter (Signed)
We had referred Sean Miranda to the afib clinic, looks like they saw him 2 weeks ago and are planning for dofetilide pending covid limitations at the hospital as this medication has to be started in the hospital. Did they discuss this with him at their appt? WOuld need to address questions about timing of medication   Zandra Abts MD

## 2020-09-13 NOTE — Telephone Encounter (Signed)
I responded to Cathy's message for this    Zandra Abts MD

## 2020-09-14 NOTE — Telephone Encounter (Signed)
I spoke with wife.they will speak with Afib clinic

## 2020-09-16 ENCOUNTER — Telehealth: Payer: Self-pay | Admitting: Pharmacist

## 2020-09-16 NOTE — Telephone Encounter (Signed)
Medication list reviewed in anticipation of upcoming Tikosyn initiation. Patient is not taking any contraindicated or QTc prolonging medications.   Patient is anticoagulated on Eliquis 5mg BID on the appropriate dose. Please ensure that patient has not missed any anticoagulation doses in the 3 weeks prior to Tikosyn initiation.   Patient will need to be counseled to avoid use of Benadryl while on Tikosyn and in the 2-3 days prior to Tikosyn initiation. 

## 2020-09-23 ENCOUNTER — Other Ambulatory Visit (HOSPITAL_COMMUNITY)
Admission: RE | Admit: 2020-09-23 | Discharge: 2020-09-23 | Disposition: A | Discharge status: Home / Self Care | Payer: Medicare Other | Source: Ambulatory Visit | Attending: Physician Assistant | Admitting: Physician Assistant

## 2020-09-23 ENCOUNTER — Other Ambulatory Visit: Payer: Self-pay

## 2020-09-23 DIAGNOSIS — M47812 Spondylosis without myelopathy or radiculopathy, cervical region: Secondary | ICD-10-CM | POA: Diagnosis not present

## 2020-09-23 DIAGNOSIS — I2510 Atherosclerotic heart disease of native coronary artery without angina pectoris: Secondary | ICD-10-CM | POA: Diagnosis not present

## 2020-09-23 DIAGNOSIS — I10 Essential (primary) hypertension: Secondary | ICD-10-CM | POA: Diagnosis not present

## 2020-09-23 DIAGNOSIS — Z79899 Other long term (current) drug therapy: Secondary | ICD-10-CM | POA: Diagnosis not present

## 2020-09-23 DIAGNOSIS — I4811 Longstanding persistent atrial fibrillation: Secondary | ICD-10-CM | POA: Diagnosis not present

## 2020-09-23 DIAGNOSIS — Z86010 Personal history of colonic polyps: Secondary | ICD-10-CM | POA: Diagnosis not present

## 2020-09-23 DIAGNOSIS — Z20822 Contact with and (suspected) exposure to covid-19: Secondary | ICD-10-CM | POA: Insufficient documentation

## 2020-09-23 DIAGNOSIS — Z888 Allergy status to other drugs, medicaments and biological substances status: Secondary | ICD-10-CM | POA: Diagnosis not present

## 2020-09-23 DIAGNOSIS — Z01812 Encounter for preprocedural laboratory examination: Secondary | ICD-10-CM | POA: Insufficient documentation

## 2020-09-23 DIAGNOSIS — Z8249 Family history of ischemic heart disease and other diseases of the circulatory system: Secondary | ICD-10-CM | POA: Diagnosis not present

## 2020-09-23 DIAGNOSIS — I739 Peripheral vascular disease, unspecified: Secondary | ICD-10-CM | POA: Diagnosis not present

## 2020-09-23 DIAGNOSIS — Z881 Allergy status to other antibiotic agents status: Secondary | ICD-10-CM | POA: Diagnosis not present

## 2020-09-23 DIAGNOSIS — E785 Hyperlipidemia, unspecified: Secondary | ICD-10-CM | POA: Diagnosis not present

## 2020-09-23 DIAGNOSIS — I25110 Atherosclerotic heart disease of native coronary artery with unstable angina pectoris: Secondary | ICD-10-CM | POA: Diagnosis not present

## 2020-09-23 DIAGNOSIS — D6869 Other thrombophilia: Secondary | ICD-10-CM | POA: Diagnosis not present

## 2020-09-23 DIAGNOSIS — Z7901 Long term (current) use of anticoagulants: Secondary | ICD-10-CM | POA: Diagnosis not present

## 2020-09-23 DIAGNOSIS — Z87891 Personal history of nicotine dependence: Secondary | ICD-10-CM | POA: Diagnosis not present

## 2020-09-23 LAB — SARS CORONAVIRUS 2 (TAT 6-24 HRS): SARS Coronavirus 2: NEGATIVE

## 2020-09-26 ENCOUNTER — Encounter (HOSPITAL_COMMUNITY): Payer: Self-pay | Admitting: Internal Medicine

## 2020-09-26 ENCOUNTER — Other Ambulatory Visit: Payer: Self-pay

## 2020-09-26 ENCOUNTER — Inpatient Hospital Stay (HOSPITAL_COMMUNITY)
Admission: AD | Admit: 2020-09-26 | Discharge: 2020-09-29 | DRG: 309 | Disposition: A | Discharge status: Home / Self Care | Payer: Medicare Other | Source: Ambulatory Visit | Attending: Cardiology | Admitting: Cardiology

## 2020-09-26 ENCOUNTER — Ambulatory Visit (HOSPITAL_COMMUNITY)
Admission: RE | Admit: 2020-09-26 | Discharge: 2020-09-26 | Disposition: A | Discharge status: Home / Self Care | Payer: Medicare Other | Source: Ambulatory Visit | Attending: Physician Assistant | Admitting: Physician Assistant

## 2020-09-26 VITALS — BP 148/84 | HR 84 | Ht 72.0 in | Wt 189.2 lb

## 2020-09-26 DIAGNOSIS — I25110 Atherosclerotic heart disease of native coronary artery with unstable angina pectoris: Secondary | ICD-10-CM | POA: Diagnosis present

## 2020-09-26 DIAGNOSIS — Z20822 Contact with and (suspected) exposure to covid-19: Secondary | ICD-10-CM | POA: Diagnosis present

## 2020-09-26 DIAGNOSIS — I739 Peripheral vascular disease, unspecified: Secondary | ICD-10-CM | POA: Diagnosis present

## 2020-09-26 DIAGNOSIS — M47812 Spondylosis without myelopathy or radiculopathy, cervical region: Secondary | ICD-10-CM | POA: Diagnosis present

## 2020-09-26 DIAGNOSIS — Z87891 Personal history of nicotine dependence: Secondary | ICD-10-CM | POA: Diagnosis not present

## 2020-09-26 DIAGNOSIS — Z7901 Long term (current) use of anticoagulants: Secondary | ICD-10-CM | POA: Diagnosis not present

## 2020-09-26 DIAGNOSIS — Z79899 Other long term (current) drug therapy: Secondary | ICD-10-CM | POA: Diagnosis not present

## 2020-09-26 DIAGNOSIS — D6869 Other thrombophilia: Secondary | ICD-10-CM | POA: Diagnosis present

## 2020-09-26 DIAGNOSIS — Z881 Allergy status to other antibiotic agents status: Secondary | ICD-10-CM

## 2020-09-26 DIAGNOSIS — I4811 Longstanding persistent atrial fibrillation: Secondary | ICD-10-CM | POA: Diagnosis present

## 2020-09-26 DIAGNOSIS — E785 Hyperlipidemia, unspecified: Secondary | ICD-10-CM | POA: Diagnosis present

## 2020-09-26 DIAGNOSIS — Z8249 Family history of ischemic heart disease and other diseases of the circulatory system: Secondary | ICD-10-CM | POA: Diagnosis not present

## 2020-09-26 DIAGNOSIS — Z86010 Personal history of colonic polyps: Secondary | ICD-10-CM

## 2020-09-26 DIAGNOSIS — Z888 Allergy status to other drugs, medicaments and biological substances status: Secondary | ICD-10-CM | POA: Diagnosis not present

## 2020-09-26 LAB — BASIC METABOLIC PANEL
Anion gap: 7 (ref 5–15)
BUN: 15 mg/dL (ref 8–23)
CO2: 27 mmol/L (ref 22–32)
Calcium: 9.3 mg/dL (ref 8.9–10.3)
Chloride: 102 mmol/L (ref 98–111)
Creatinine, Ser: 1.03 mg/dL (ref 0.61–1.24)
GFR, Estimated: >60 mL/min (ref >60)
Glucose, Bld: 92 mg/dL (ref 70–99)
Potassium: 5 mmol/L (ref 3.5–5.1)
Sodium: 136 mmol/L (ref 135–145)

## 2020-09-26 LAB — MAGNESIUM: Magnesium: 2.5 mg/dL — ABNORMAL HIGH (ref 1.7–2.4)

## 2020-09-26 MED ORDER — SODIUM CHLORIDE 0.9% FLUSH: 3.0000 mL | INTRAVENOUS | Status: DC | PRN

## 2020-09-26 MED ORDER — NITROGLYCERIN 0.4 MG SL SUBL: 0.4000 mg | SUBLINGUAL_TABLET | SUBLINGUAL | Status: DC | PRN

## 2020-09-26 MED ORDER — MAGNESIUM 500 MG PO TABS: 1.0000 | ORAL_TABLET | Freq: Every day | ORAL | Status: DC

## 2020-09-26 MED ORDER — MAGNESIUM OXIDE 400 (241.3 MG) MG PO TABS
400.0000 mg | ORAL_TABLET | Freq: Every day | ORAL | Status: DC
  Administered 2020-09-27 – 2020-09-29 (×3): 400 mg via ORAL
  Filled 2020-09-26 (×3): qty 1

## 2020-09-26 MED ORDER — MAGNESIUM OXIDE 400 (241.3 MG) MG PO TABS: 400.0000 mg | ORAL_TABLET | Freq: Every day | ORAL | Status: DC

## 2020-09-26 MED ORDER — SODIUM CHLORIDE 0.9% FLUSH
3.0000 mL | Freq: Two times a day (BID) | INTRAVENOUS | Status: DC
  Administered 2020-09-26 – 2020-09-29 (×5): 3 mL via INTRAVENOUS

## 2020-09-26 MED ORDER — SODIUM CHLORIDE 0.9 % IV SOLN: 250.0000 mL | INTRAVENOUS | Status: DC | PRN

## 2020-09-26 MED ORDER — PRAVASTATIN SODIUM 10 MG PO TABS
20.0000 mg | ORAL_TABLET | ORAL | Status: DC
  Administered 2020-09-26 – 2020-09-28 (×2): 20 mg via ORAL
  Filled 2020-09-26 (×2): qty 2

## 2020-09-26 MED ORDER — APIXABAN 5 MG PO TABS
5.0000 mg | ORAL_TABLET | Freq: Two times a day (BID) | ORAL | Status: DC
  Administered 2020-09-26 – 2020-09-29 (×6): 5 mg via ORAL
  Filled 2020-09-26 (×6): qty 1

## 2020-09-26 MED ORDER — DOFETILIDE 250 MCG PO CAPS
250.0000 ug | ORAL_CAPSULE | Freq: Two times a day (BID) | ORAL | Status: DC
  Administered 2020-09-26 – 2020-09-29 (×6): 250 ug via ORAL
  Filled 2020-09-26 (×6): qty 1

## 2020-09-26 MED ORDER — COENZYME Q10-LEVOCARNITINE 100-20 MG PO CAPS: 1.0000 | ORAL_CAPSULE | Freq: Every day | ORAL | Status: DC

## 2020-09-26 NOTE — Progress Notes (Signed)
Primary Care Physician: Asencion Noble, MD Primary Cardiologist: Dr Harl Bowie Primary Electrophysiologist: Dr Lovena Le Referring Physician: Stephanie Coup Sean Miranda is a 86 y.o. male with a history of CAD, carotid artery disease, HLD, and longstanding persistent atrial fibrillation who presents for follow up in the [facility]. The patient was initially diagnosed with atrial fibrillation 01/2017 after presenting with symptoms of chest perssure. Patient is on Eliquis for a CHADS2VASC score of 3. Due to continued dyspnea attempts were made to get him back into NSR, including multiple cardioversions and starting flecanide. Had tolerated lopressor at the time but stopped taking due to HRs in 40s or 50s. Flecainide was discontinued when 2/2 CAD. Last ECG with SR was 02/07/19. Patient reports that he has had symptoms of increased fatigue over the last couple months. He denies CP, SOB, dizziness, or palpitations.   On follow up today, patient presents for dofetilide admission. He continues to have symptoms of increased fatigue despite good rate control. He denies any missed doses of anticoagulation in the last 3 weeks.   Today, he denies symptoms of palpitations, chest pain, shortness of breath, orthopnea, PND, lower extremity edema, dizziness, presyncope, syncope, snoring, daytime somnolence, bleeding, or neurologic sequela. The patient is tolerating medications without difficulties and is otherwise without complaint today.    Atrial Fibrillation Risk Factors:  he does not have symptoms or diagnosis of sleep apnea. he does not have a history of rheumatic fever.   he has a BMI of Body mass index is 25.66 kg/m.Marland Kitchen Filed Weights   09/26/20 1033  Weight: 85.8 kg    Family History  Problem Relation Age of Onset  . Stroke Father   . Hypertension Mother   . Prostate cancer Son   . Stroke Brother   . Cancer Brother        spine     Atrial Fibrillation  Management history:  Previous antiarrhythmic drugs: flecainide Previous cardioversions: 2018 Previous ablations: none CHADS2VASC score: 3 Anticoagulation history: Eliquis   Past Medical History:  Diagnosis Date  . Arthritis    neck   . Atrial fibrillation ([place])    a. new-onset in 01/2017. Started on Eliquis  . CAD (coronary artery disease), native coronary artery 05/16/2017   LAD 95%>>0 w/ 2.5 x 16 mm Synergy DES, D2 40%, RCA 50%, EF 55-65%, LVEDP mod elevated  . Calcification of coronary artery    a. mild, nonobstructive CAD by cath in 2009.  Marland Kitchen Cancer of skin of chest   . Carotid artery occlusion    a. s/p R CEA in 2014  . Colon polyps   . Hyperlipidemia    takes Pravastatin every other day  . Pneumonia ~ 01/2013   Past Surgical History:  Procedure Laterality Date  . CARDIAC CATHETERIZATION  06/09/1999   mild CAD (LAD, diagonal, RCA) - Dr. Loni Muse. [place]  . CARDIOVERSION N/A 03/25/2017   Procedure: CARDIOVERSION;  Surgeon: Pixie Casino, MD;  Location: [facility];  Service: Cardiovascular;  Laterality: N/A;  . CAROTID DOPPLER  02/2012   60-79% RICA stenosis, 42-70% LICA stenosis (prior to carotid endarterectomy)  . COLONOSCOPY    . CORONARY STENT INTERVENTION N/A 05/16/2017   Procedure: Coronary Stent Intervention;  Surgeon: Jettie Booze, MD;  Location: [facility];  Service: Cardiovascular;  Laterality: N/A;  . ENDARTERECTOMY Right 11/10/2013   Procedure: ENDARTERECTOMY CAROTID;  Surgeon: Conrad Garland, MD;  Location: [facility];  Service: Vascular;  Laterality: Right;  .  INGUINAL HERNIA REPAIR Left   . LEFT HEART CATH AND CORONARY ANGIOGRAPHY N/A 05/16/2017   Procedure: Left Heart Cath and Coronary Angiography;  Surgeon: Jettie Booze, MD;  Location: [place] [facility];  Service: Cardiovascular;  Laterality: N/A;  . MOLE REMOVAL  1990s   "chest"  . NM MYOCAR PERF WALL MOTION  04/2011   bruce myoview - perfusion defect in inferior myocardium (diaphragmatic  attenuation), remaining myocardium with normal perfusion, EF 67%  . PATCH ANGIOPLASTY Right 11/10/2013   Procedure: PATCH ANGIOPLASTY;  Surgeon: Conrad [place], MD;  Location: [place];  Service: Vascular;  Laterality: Right;  . PILONIDAL CYST DRAINAGE    . SHOULDER OPEN ROTATOR CUFF REPAIR Bilateral 1998 - 2001  . SKIN CANCER EXCISION  ~ 2015   "chest"  . TONSILLECTOMY      Current Outpatient Medications  Medication Sig Dispense Refill  . ALPHA LIPOIC ACID PO Take 300 mg by mouth daily.    . Cholecalciferol (VITAMIN D3) 2000 UNITS capsule Take 2,000 Units by mouth daily.     . Coenzyme Q10-Levocarnitine 100-20 MG CAPS Take 1 capsule by mouth daily.     Marland Kitchen ELIQUIS 5 MG TABS tablet TAKE (1) TABLET BY MOUTH TWICE DAILY. 180 tablet 1  . Glucosamine-Chondroitin 500-400 MG CAPS Take 1 capsule by mouth daily.     . Magnesium 500 MG TABS Take 1 tablet by mouth daily.    . Multiple Vitamin (MULTIVITAMIN) tablet Take 0.5 tablets by mouth daily.     . nitroGLYCERIN (NITROSTAT) 0.4 MG SL tablet Place 1 tablet (0.4 mg total) under the tongue every 5 (five) minutes as needed for chest pain. 30 tablet 0  . OMEGA-3 1000 MG CAPS Take 4,000 mg by mouth daily.    . pravastatin (PRAVACHOL) 40 MG tablet TAKE 1/2 TABLET BY MOUTH EVERY MONDAY, WEDNESDAY AND FRIDAY. 36 tablet 3  . Probiotic Product (PROBIOTIC DAILY PO) Take 1 capsule by mouth daily.    . sildenafil (REVATIO) 20 MG tablet TAKE 2 TO 3 TABLETS BY MOUTH AS NEEDED FOR SEXUAL ACTIVITY 60 tablet 4   No current facility-administered medications for this encounter.    Allergies  Allergen Reactions  . Lipitor [Atorvastatin Calcium] Other (See Comments)    Muscle pain  . Vantin [Cefpodoxime] Rash    Social History   Socioeconomic History  . Marital status: Married    Spouse name: Not on file  . Number of children: 3  . Years of education: Not on file  . Highest education level: Not on file  Occupational History    Employer: [employer]    Tobacco Use  . Smoking status: Former Smoker    Packs/day: 1.00    Years: 18.00    Pack years: 18.00    Types: Cigarettes    Quit date: 12/03/1972    Years since quitting: 47.8  . Smokeless tobacco: Never Used  Vaping Use  . Vaping Use: Never used  Substance and Sexual Activity  . Alcohol use: Yes    Alcohol/week: 14.0 standard drinks    Types: 7 Glasses of wine, 7 Shots of liquor per week  . Drug use: No  . Sexual activity: Yes  Other Topics Concern  . Not on file  Social History Narrative  . Not on file   Social Determinants of Health   Financial Resource Strain:   . Difficulty of Paying Living Expenses: Not on file  Food Insecurity:   . Worried About Charity fundraiser  in the Last Year: Not on file  . Ran Out of Food in the Last Year: Not on file  Transportation Needs:   . Lack of Transportation (Medical): Not on file  . Lack of Transportation (Non-Medical): Not on file  Physical Activity:   . Days of Exercise per Week: Not on file  . Minutes of Exercise per Session: Not on file  Stress:   . Feeling of Stress : Not on file  Social Connections:   . Frequency of Communication with Friends and Family: Not on file  . Frequency of Social Gatherings with Friends and Family: Not on file  . Attends Religious Services: Not on file  . Active Member of Clubs or Organizations: Not on file  . Attends Archivist Meetings: Not on file  . Marital Status: Not on file  Intimate Partner Violence:   . Fear of Current or Ex-Partner: Not on file  . Emotionally Abused: Not on file  . Physically Abused: Not on file  . Sexually Abused: Not on file     ROS- All systems are reviewed and negative except as per the HPI above.  Physical Exam: Vitals:   09/26/20 1033  BP: (!) 148/84  Pulse: 84  Weight: 85.8 kg  Height: 6' (1.829 m)    GEN- The patient is well appearing elderly male, alert and oriented x 3 today.   HEENT-head normocephalic, atraumatic, sclera clear,  conjunctiva pink, hearing intact, trachea midline. Lungs- Clear to ausculation bilaterally, normal work of breathing Heart- irregular rate and rhythm, no murmurs, rubs or gallops  GI- soft, NT, ND, + BS Extremities- no clubbing, cyanosis, or edema MS- no significant deformity or atrophy Skin- no rash or lesion Psych- euthymic mood, full affect Neuro- strength and sensation are intact   Wt Readings from Last 3 Encounters:  09/26/20 85.8 kg  08/29/20 87.1 kg  08/23/20 87.5 kg    EKG today demonstrates afib HR 84, QRS 96, QTc 441  Echo 05/15/17 demonstrated  - Left ventricle: The cavity size was normal. Wall thickness was  increased in a pattern of mild LVH. Systolic function was normal.  The estimated ejection fraction was in the range of 60% to 65%.  Wall motion was normal; there were no regional wall motion  abnormalities. The study is not technically sufficient to allow  evaluation of LV diastolic function.  - Aortic valve: Mildly calcified annulus. Trileaflet; moderately  thickened leaflets. Valve area (VTI): 2.98 cm^2. Valve area  (Vmax): 2.7 cm^2. Valve area (Vmean): 2.56 cm^2.  - Left atrium: The atrium was mildly dilated.  - Technically adequate study.   Epic records are reviewed at length today  CHA2DS2-VASc Score = 3  The patient's score is based upon: CHF History: 0 HTN History: 0 Diabetes History: 0 Stroke History: 0 Vascular Disease History: 1      ASSESSMENT AND PLAN: 1. Longstanding Persistent Atrial Fibrillation (ICD10:  I48.11) The patient's CHA2DS2-VASc score is 3, indicating a 3.2% annual risk of stroke.   Patient appears to have been in afib since at least 04/2019.  Patient wants to pursue dofetilide, aware of risk vrs benefit Aware of price of dofetilide. Patient will continue on Eliquis 5 mg BID, states no missed doses No benadryl use PharmD has screened drugs and no QT prolonging drugs on board QTc in SR 434 ms Labs today show  creatinine at 1.03, K+ 5.0 and mag 2.4, CrCl calculated at 60 mL/min Will need to watch CrCl closely as he is  borderline between 500 mcg and 250 mcg.  2. Secondary Hypercoagulable State (ICD10:  D68.69) The patient is at significant risk for stroke/thromboembolism based upon his CHA2DS2-VASc Score of 3.  Continue Apixaban (Eliquis).   3. CAD No anginal symptoms.   To be admitted later today once a bed becomes available.    [facility] [address] [place], [place] [address] [phone] 09/26/2020 11:47 AM

## 2020-09-26 NOTE — H&P (Signed)
Primary Care Physician: Asencion Noble, MD Primary Cardiologist: Dr Harl Bowie Primary Electrophysiologist: Dr Lovena Le Referring Physician: Stephanie Coup [place] is a 86 y.o. male with a history of CAD, carotid artery disease, HLD, and longstanding persistent atrial fibrillation who presents for follow up in the [facility]. The patient was initially diagnosed with atrial fibrillation 01/2017 after presenting with symptoms of chest perssure. Patient is on Eliquis for a CHADS2VASC score of 3. Due to continued dyspnea attempts were made to get him back into NSR, including multiple cardioversions and starting flecanide. Had tolerated lopressor at the time but stopped taking due to HRs in 40s or 50s. Flecainide was discontinued when 2/2 CAD. Last ECG with SR was 02/07/19. Patient reports that he has had symptoms of increased fatigue over the last couple months. He denies CP, SOB, dizziness, or palpitations.   On follow up today, patient presents for dofetilide admission. He continues to have symptoms of increased fatigue despite good rate control. He denies any missed doses of anticoagulation in the last 3 weeks.   Today, he denies symptoms of palpitations, chest pain, shortness of breath, orthopnea, PND, lower extremity edema, dizziness, presyncope, syncope, snoring, daytime somnolence, bleeding, or neurologic sequela. The patient is tolerating medications without difficulties and is otherwise without complaint today.    Atrial Fibrillation Risk Factors:  he does not have symptoms or diagnosis of sleep apnea. he does not have a history of rheumatic fever.   he has a BMI of Body mass index is 25.66 kg/m.Marland Kitchen    Filed Weights   09/26/20 1033  Weight: 85.8 kg         Family History  Problem Relation Age of Onset  . Stroke Father   . Hypertension Mother   . Prostate cancer Son   . Stroke Brother   . Cancer Brother        spine      Atrial Fibrillation Management history:  Previous antiarrhythmic drugs: flecainide Previous cardioversions: 2018 Previous ablations: none CHADS2VASC score: 3 Anticoagulation history: Eliquis       Past Medical History:  Diagnosis Date  . Arthritis    neck   . Atrial fibrillation ([place])    a. new-onset in 01/2017. Started on Eliquis  . CAD (coronary artery disease), native coronary artery 05/16/2017   LAD 95%>>0 w/ 2.5 x 16 mm Synergy DES, D2 40%, RCA 50%, EF 55-65%, LVEDP mod elevated  . Calcification of coronary artery    a. mild, nonobstructive CAD by cath in 2009.  Marland Kitchen Cancer of skin of chest   . Carotid artery occlusion    a. s/p R CEA in 2014  . Colon polyps   . Hyperlipidemia    takes Pravastatin every other day  . Pneumonia ~ 01/2013        Past Surgical History:  Procedure Laterality Date  . CARDIAC CATHETERIZATION  06/09/1999   mild CAD (LAD, diagonal, RCA) - Dr. Loni Muse. Little  . CARDIOVERSION N/A 03/25/2017   Procedure: CARDIOVERSION;  Surgeon: Pixie Casino, MD;  Location: [facility] ENDOSCOPY;  Service: Cardiovascular;  Laterality: N/A;  . CAROTID DOPPLER  02/2012   60-79% RICA stenosis, 19-50% LICA stenosis (prior to carotid endarterectomy)  . COLONOSCOPY    . CORONARY STENT INTERVENTION N/A 05/16/2017   Procedure: Coronary Stent Intervention;  Surgeon: Jettie Booze, MD;  Location: [place] [facility];  Service: Cardiovascular;  Laterality: N/A;  . ENDARTERECTOMY Right 11/10/2013   Procedure:  ENDARTERECTOMY CAROTID;  Surgeon: Conrad [place], MD;  Location: [place];  Service: Vascular;  Laterality: Right;  . INGUINAL HERNIA REPAIR Left   . LEFT HEART CATH AND CORONARY ANGIOGRAPHY N/A 05/16/2017   Procedure: Left Heart Cath and Coronary Angiography;  Surgeon: Jettie Booze, MD;  Location: [place] [facility];  Service: Cardiovascular;  Laterality: N/A;  . MOLE REMOVAL  1990s   "chest"  . NM MYOCAR PERF WALL MOTION  04/2011    bruce myoview - perfusion defect in inferior myocardium (diaphragmatic attenuation), remaining myocardium with normal perfusion, EF 67%  . PATCH ANGIOPLASTY Right 11/10/2013   Procedure: PATCH ANGIOPLASTY;  Surgeon: Conrad Lake Holiday, MD;  Location: [place] [facility];  Service: Vascular;  Laterality: Right;  . PILONIDAL CYST DRAINAGE    . SHOULDER OPEN ROTATOR CUFF REPAIR Bilateral 1998 - 2001  . SKIN CANCER EXCISION  ~ 2015   "chest"  . TONSILLECTOMY            Current Outpatient Medications  Medication Sig Dispense Refill  . ALPHA LIPOIC ACID PO Take 300 mg by mouth daily.    . Cholecalciferol (VITAMIN D3) 2000 UNITS capsule Take 2,000 Units by mouth daily.     . Coenzyme Q10-Levocarnitine 100-20 MG CAPS Take 1 capsule by mouth daily.     Marland Kitchen ELIQUIS 5 MG TABS tablet TAKE (1) TABLET BY MOUTH TWICE DAILY. 180 tablet 1  . Glucosamine-Chondroitin 500-400 MG CAPS Take 1 capsule by mouth daily.     . Magnesium 500 MG TABS Take 1 tablet by mouth daily.    . Multiple Vitamin (MULTIVITAMIN) tablet Take 0.5 tablets by mouth daily.     . nitroGLYCERIN (NITROSTAT) 0.4 MG SL tablet Place 1 tablet (0.4 mg total) under the tongue every 5 (five) minutes as needed for chest pain. 30 tablet 0  . OMEGA-3 1000 MG CAPS Take 4,000 mg by mouth daily.    . pravastatin (PRAVACHOL) 40 MG tablet TAKE 1/2 TABLET BY MOUTH EVERY MONDAY, WEDNESDAY AND FRIDAY. 36 tablet 3  . Probiotic Product (PROBIOTIC DAILY PO) Take 1 capsule by mouth daily.    . sildenafil (REVATIO) 20 MG tablet TAKE 2 TO 3 TABLETS BY MOUTH AS NEEDED FOR SEXUAL ACTIVITY 60 tablet 4   No current facility-administered medications for this encounter.         Allergies  Allergen Reactions  . Lipitor [Atorvastatin Calcium] Other (See Comments)    Muscle pain  . Vantin [Cefpodoxime] Rash    Social History        Socioeconomic History  . Marital status: Married    Spouse name: Not on file  . Number of children: 3  . Years  of education: Not on file  . Highest education level: Not on file  Occupational History    Employer: [employer]  Tobacco Use  . Smoking status: Former Smoker    Packs/day: 1.00    Years: 18.00    Pack years: 18.00    Types: Cigarettes    Quit date: 12/03/1972    Years since quitting: 47.8  . Smokeless tobacco: Never Used  Vaping Use  . Vaping Use: Never used  Substance and Sexual Activity  . Alcohol use: Yes    Alcohol/week: 14.0 standard drinks    Types: 7 Glasses of wine, 7 Shots of liquor per week  . Drug use: No  . Sexual activity: Yes  Other Topics Concern  . Not on file  Social History Narrative  . Not on  file   Social Determinants of Health      Financial Resource Strain:   . Difficulty of Paying Living Expenses: Not on file  Food Insecurity:   . Worried About Charity fundraiser in the Last Year: Not on file  . Ran Out of Food in the Last Year: Not on file  Transportation Needs:   . Lack of Transportation (Medical): Not on file  . Lack of Transportation (Non-Medical): Not on file  Physical Activity:   . Days of Exercise per Week: Not on file  . Minutes of Exercise per Session: Not on file  Stress:   . Feeling of Stress : Not on file  Social Connections:   . Frequency of Communication with Friends and Family: Not on file  . Frequency of Social Gatherings with Friends and Family: Not on file  . Attends Religious Services: Not on file  . Active Member of Clubs or Organizations: Not on file  . Attends Archivist Meetings: Not on file  . Marital Status: Not on file  Intimate Partner Violence:   . Fear of Current or Ex-Partner: Not on file  . Emotionally Abused: Not on file  . Physically Abused: Not on file  . Sexually Abused: Not on file     ROS- All systems are reviewed and negative except as per the HPI above.  Physical Exam:    Vitals:   09/26/20 1033  BP: (!) 148/84  Pulse: 84  Weight: 85.8 kg  Height: 6'  (1.829 m)    GEN- The patient is well appearing elderly male, alert and oriented x 3 today.   HEENT-head normocephalic, atraumatic, sclera clear, conjunctiva pink, hearing intact, trachea midline. Lungs- Clear to ausculation bilaterally, normal work of breathing Heart- irregular rate and rhythm, no murmurs, rubs or gallops  GI- soft, NT, ND, + BS Extremities- no clubbing, cyanosis, or edema MS- no significant deformity or atrophy Skin- no rash or lesion Psych- euthymic mood, full affect Neuro- strength and sensation are intact      Wt Readings from Last 3 Encounters:  09/26/20 85.8 kg  08/29/20 87.1 kg  08/23/20 87.5 kg    EKG today demonstrates afib HR 84, QRS 96, QTc 441  Echo 05/15/17 demonstrated  - Left ventricle: The cavity size was normal. Wall thickness was  increased in a pattern of mild LVH. Systolic function was normal.  The estimated ejection fraction was in the range of 60% to 65%.  Wall motion was normal; there were no regional wall motion  abnormalities. The study is not technically sufficient to allow  evaluation of LV diastolic function.  - Aortic valve: Mildly calcified annulus. Trileaflet; moderately  thickened leaflets. Valve area (VTI): 2.98 cm^2. Valve area  (Vmax): 2.7 cm^2. Valve area (Vmean): 2.56 cm^2.  - Left atrium: The atrium was mildly dilated.  - Technically adequate study.   Epic records are reviewed at length today  CHA2DS2-VASc Score = 3  The patient's score is based upon: CHF History: 0 HTN History: 0 Diabetes History: 0 Stroke History: 0 Vascular Disease History: 1      ASSESSMENT AND PLAN: 1. Longstanding Persistent Atrial Fibrillation (ICD10:  I48.11) The patient's CHA2DS2-VASc score is 3, indicating a 3.2% annual risk of stroke.   Patient appears to have been in afib since at least 04/2019.  Patient wants to pursue dofetilide, aware of risk vrs benefit Aware of price of dofetilide. Patient will  continue on Eliquis 5 mg BID, states no missed doses  No benadryl use PharmD has screened drugs and no QT prolonging drugs on board QTc in SR 434 ms Labs today show creatinine at 1.03, K+ 5.0 and mag 2.4, CrCl calculated at 60 mL/min Will need to watch CrCl closely as he is borderline between 500 mcg and 250 mcg.  2. Secondary Hypercoagulable State (ICD10:  D68.69) The patient is at significant risk for stroke/thromboembolism based upon his CHA2DS2-VASc Score of 3.  Continue Apixaban (Eliquis).   3. CAD No anginal symptoms.   To be admitted later today once a bed becomes available.    [facility] [address] [place], [place] [address] [phone] 09/26/2020 11:47 AM  EP Attending  Patient seen and examined. Agree with the findings as noted above. The patient presents for initiation of dofetilide. I have reviewed the indications/risks/benefits/goals/expectations and he wishes to proceed.  Carleene Overlie [place],MD

## 2020-09-27 LAB — BASIC METABOLIC PANEL
Anion gap: 7 (ref 5–15)
BUN: 14 mg/dL (ref 8–23)
CO2: 23 mmol/L (ref 22–32)
Calcium: 8.9 mg/dL (ref 8.9–10.3)
Chloride: 106 mmol/L (ref 98–111)
Creatinine, Ser: 1.02 mg/dL (ref 0.61–1.24)
GFR, Estimated: >60 mL/min (ref >60)
Glucose, Bld: 85 mg/dL (ref 70–99)
Potassium: 4 mmol/L (ref 3.5–5.1)
Sodium: 136 mmol/L (ref 135–145)

## 2020-09-27 LAB — MAGNESIUM: Magnesium: 2.2 mg/dL (ref 1.7–2.4)

## 2020-09-27 NOTE — Progress Notes (Signed)
Qtc 427 post 3rd dose of Tikosyn. Pt to be NPO after MN for DCCV tomorrow. Will continue to monitor. Jessie Foot, RN

## 2020-09-27 NOTE — Progress Notes (Addendum)
Progress Note  Patient Name: Sean Miranda Date of Encounter: 09/27/2020  [facility] Cardiologist: Carlyle Dolly, MD   Subjective   Up and about in the room, no complaints  Inpatient Medications    Scheduled Meds:  apixaban  5 mg Oral BID   dofetilide  250 mcg Oral BID   magnesium oxide  400 mg Oral Daily   pravastatin  20 mg Oral Once per day on Mon Wed Fri   sodium chloride flush  3 mL Intravenous Q12H   Continuous Infusions:  sodium chloride     PRN Meds: sodium chloride, nitroGLYCERIN, sodium chloride flush   Vital Signs    Vitals:   09/26/20 1958 09/26/20 2359 09/27/20 0529 09/27/20 0629  BP: 112/60 112/63 124/80 112/71  Pulse: 87 74 80 78  Resp: 16 18 18 18   Temp: 97.9 F (36.6 C) 98.4 F (36.9 C) 98.5 F (36.9 C) 97.8 F (36.6 C)  TempSrc: Oral Oral Oral Oral  SpO2: 98% 100% 97% 96%  Weight:    83.2 kg  Height:    6' (1.829 m)    Intake/Output Summary (Last 24 hours) at 09/27/2020 0752 Last data filed at 09/26/2020 1900 Gross per 24 hour  Intake 240 ml  Output --  Net 240 ml   Last 3 Weights 09/27/2020 09/26/2020 09/26/2020  Weight (lbs) 183 lb 6.4 oz 186 lb 8 oz 189 lb 3.2 oz  Weight (kg) 83.19 kg 84.596 kg 85.821 kg      Telemetry    AFib  80's - Personally Reviewed  ECG    AFib 72bpm, reviewed with Dr. Lovena Le, QTc stable - Personally Reviewed  Physical Exam   GEN: No acute distress.   Neck: No JVD Cardiac: irreg-irreg, no murmurs, rubs, or gallops.  Respiratory: CTA b/l GI: Soft, nontender, non-distended  MS: No edema; No deformity. Neuro:  Nonfocal  Psych: Normal affect   Labs    High Sensitivity Troponin:  No results for input(s): TROPONINIHS in the last 720 hours.    Chemistry Recent Labs  Lab 09/26/20 1034 09/27/20 0143  NA 136 136  K 5.0 4.0  CL 102 106  CO2 27 23  GLUCOSE 92 85  BUN 15 14  CREATININE 1.03 1.02  CALCIUM 9.3 8.9  GFRNONAA >60 >60  ANIONGAP 7 7     HematologyNo results for  input(s): WBC, RBC, HGB, HCT, MCV, MCH, MCHC, RDW, PLT in the last 168 hours.  BNPNo results for input(s): BNP, PROBNP in the last 168 hours.   DDimer No results for input(s): DDIMER in the last 168 hours.   Radiology    No results found.  Cardiac Studies    09/05/2020: TTE IMPRESSIONS  1. Left ventricular ejection fraction, by estimation, is 65 to 70%. The  left ventricle has normal function. The left ventricle has no regional  wall motion abnormalities. Left ventricular diastolic parameters are  indeterminate.   2. Right ventricular systolic function is normal. The right ventricular  size is normal.   3. Left atrial size was mild to moderately dilated.   4. The mitral valve is normal in structure. No evidence of mitral valve  regurgitation. No evidence of mitral stenosis.   5. The aortic valve was not well visualized. There is mild calcification  of the aortic valve. There is mild thickening of the aortic valve. Aortic  valve regurgitation is not visualized. No aortic stenosis is present.   6. The inferior vena cava is normal in size with  greater than 50%  respiratory variability, suggesting right atrial pressure of 3 mmHg.    10/2019 nuclear stress Unable to achieve adequate heart rate response with GXT, Lexiscan utilized. No diagnostic ST segment changes. Small, mild intensity, inferior/inferoseptal defect extending from apex to base that is fixed (actually more prominent at rest) and consistent with soft tissue attenuation. No reversible defects to indicate ischemia. This is a low risk study. Nuclear stress EF: 69%.    05/2017 cath   Mid RCA lesion, 50 %stenosed. Ost RCA lesion, 25 %stenosed. Lesion noted in small RV marginal. LM lesion, 25 %stenosed. 2nd Diag lesion, 40 %stenosed. Mid LAD-1 lesion, 20 %stenosed. Mid LAD-2 lesion, 95 %stenosed. A STENT SYNERGY DES 2.5X16 drug eluting stent was successfully placed, postdilated to 3.0 mm. Post intervention, there is a  0% residual stenosis. The left ventricular systolic function is normal. LV end diastolic pressure is moderately elevated. The left ventricular ejection fraction is 55-65% by visual estimate. There is no aortic valve stenosis.   LAD was the culprit for his unstable angina. He had symptoms during the procedure at rest. These resolved after stent placement. Watch overnight. Continue IV Angiomax for another hour.   Loaded with Plavix today. We'll start 75 mg daily tomorrow. Given aspirin today as well. Would plan for aspirin, Plavix and Eliquis for 30 days. After 30 days, would stop the aspirin and continue Plavix and Eliquis. A synergy drug-eluting stent was used since he is on long-term anticoagulation. If there is a bleeding complication, his antiplatelet therapy could be stopped sooner than usual. Ideally, he would get 6-12 months of Plavix depending on his bleeding risk profile.           Patient Profile     86 y.o. male w/PMHx of CAD (PCI 2018), PVD (R CEA 2014), HLD, and AFib admitted for Tikosyn initiation  Assessment & Plan    1. Longstanding persistent AFib     CHA2DS2Vasc is 3, on Eliqus appropriately dosed, pt reported no missed doses prior to arrival     Tikosyn load is in progress     K+ 4.0     Mag 2.2     Creat stable 1.02     QTc stable  DCCV tomorrow, pt is aware and agreeable        2. CAD 3. PVD     No symptoms, continue home regime  For questions or updates, please contact [place] Please consult [email or web address] for contact info under        Signed, Baldwin Jamaica, PA-C  09/27/2020, 7:52 AM    EP Attending  Patient seen and examined. Agree with the findings as noted above. The patient is doing well, remains in atrial fib, and his QT is acceptable. He will continue dofetilide and undergo DCCV tomorrow.   Carleene Overlie Susane Bey,MD

## 2020-09-27 NOTE — Progress Notes (Signed)
Pharmacy Review for Dofetilide (Tikosyn) Initiation  Admit Complaint: 86 y.o. male admitted 09/26/2020 with atrial fibrillation to be initiated on dofetilide.   Assessment:  Patient Exclusion Criteria: If any screening criteria checked as "Yes", then  patient  should NOT receive dofetilide until criteria item is corrected. If "Yes" please indicate correction plan.  YES  NO Patient  Exclusion Criteria Correction Plan  []  [x]  Baseline QTc interval is greater than or equal to 440 msec. IF above YES box checked dofetilide contraindicated unless patient has ICD; then may proceed if QTc 500-550 msec or with known ventricular conduction abnormalities may proceed with QTc 550-600 msec. QTc =     []  [x]  Magnesium level is less than 1.8 mEq/l : Last magnesium:  Lab Results  Component Value Date   MG 2.2 09/27/2020         []  [x]  Potassium level is less than 4 mEq/l : Last potassium:  Lab Results  Component Value Date   K 4.0 09/27/2020         []  [x]  Patient is known or suspected to have a digoxin level greater than 2 ng/ml: No results found for: DIGOXIN    []  [x]  Creatinine clearance less than 20 ml/min (calculated using Cockcroft-Gault, actual body weight and serum creatinine): Estimated Creatinine Clearance: 54.9 mL/min (by C-G formula based on SCr of 1.02 mg/dL).    []  [x]  Patient has received drugs known to prolong the QT intervals within the last 48 hours (phenothiazines, tricyclics or tetracyclic antidepressants, erythromycin, H-1 antihistamines, cisapride, fluoroquinolones, azithromycin). Drugs not listed above may have an, as yet, undetected potential to prolong the QT interval, updated information on QT prolonging agents is available at this website:QT prolonging agents   []  [x]  Patient received a dose of hydrochlorothiazide (Oretic) alone or in any combination including triamterene (Dyazide, Maxzide) in the last 48 hours.   []  [x]  Patient received a medication known to increase  dofetilide plasma concentrations prior to initial dofetilide dose:  . Trimethoprim (Primsol, Proloprim) in the last 36 hours . Verapamil (Calan, Verelan) in the last 36 hours or a sustained release dose in the last 72 hours . Megestrol (Megace) in the last 5 days  . Cimetidine (Tagamet) in the last 6 hours . Ketoconazole (Nizoral) in the last 24 hours . Itraconazole (Sporanox) in the last 48 hours  . Prochlorperazine (Compazine) in the last 36 hours    []  [x]  Patient is known to have a history of torsades de pointes; congenital or acquired long QT syndromes.   []  [x]  Patient has received a Class 1 antiarrhythmic with less than 2 half-lives since last dose. (Disopyramide, Quinidine, Procainamide, Lidocaine, Mexiletine, Flecainide, Propafenone)   []  []  Patient has received amiodarone therapy in the past 3 months or amiodarone level is greater than 0.3 ng/ml.    Patient has been appropriately anticoagulated with apixaban5mg  bid.  Ordering provider was confirmed at [email or web address] if they are not listed on the [place] Prescribers list.  Goal of Therapy: Follow renal function, electrolytes, potential drug interactions, and dose adjustment. Provide education and 1 week supply at discharge.  Plan:  [x]   Physician selected initial dose within range recommended for patients level of renal function - will monitor for response.  []   Physician selected initial dose outside of range recommended for patients level of renal function - will discuss if the dose should be altered at this time.   Select One Calculated CrCl  Dose q12h  []  > 60 ml/min 500 mcg  [  x] 40-60 ml/min 250 mcg  []  20-40 ml/min 125 mcg   2. Follow up QTc after the first 5 doses, renal function, electrolytes (K & Mg) daily x 3     days, dose adjustment, success of initiation and facilitate 1 week discharge supply as     clinically indicated.  3. Initiate Tikosyn education video (Call [phone] and ask for Tikosyn Video #  [id number]).  4. Place Enrollment Form on the chart for discharge supply of dofetilide.  [email or web address]     Bonnita Nasuti Pharm.D. CPP, BCPS Clinical Pharmacist [phone] 09/27/2020 7:17 AM

## 2020-09-28 ENCOUNTER — Inpatient Hospital Stay (HOSPITAL_COMMUNITY): Payer: Medicare Other | Admitting: Anesthesiology

## 2020-09-28 ENCOUNTER — Encounter (HOSPITAL_COMMUNITY): Payer: Self-pay | Admitting: Internal Medicine

## 2020-09-28 ENCOUNTER — Encounter (HOSPITAL_COMMUNITY)
Admission: AD | Disposition: A | Discharge status: Home / Self Care | Payer: Self-pay | Source: Ambulatory Visit | Attending: Internal Medicine

## 2020-09-28 HISTORY — PX: CARDIOVERSION: SHX1299

## 2020-09-28 LAB — BASIC METABOLIC PANEL
Anion gap: 7 (ref 5–15)
BUN: 13 mg/dL (ref 8–23)
CO2: 26 mmol/L (ref 22–32)
Calcium: 9.1 mg/dL (ref 8.9–10.3)
Chloride: 103 mmol/L (ref 98–111)
Creatinine, Ser: 0.95 mg/dL (ref 0.61–1.24)
GFR, Estimated: >60 mL/min (ref >60)
Glucose, Bld: 90 mg/dL (ref 70–99)
Potassium: 3.8 mmol/L (ref 3.5–5.1)
Sodium: 136 mmol/L (ref 135–145)

## 2020-09-28 LAB — MAGNESIUM: Magnesium: 2.1 mg/dL (ref 1.7–2.4)

## 2020-09-28 SURGERY — CARDIOVERSION
Anesthesia: General

## 2020-09-28 MED ORDER — POTASSIUM CHLORIDE CRYS ER 20 MEQ PO TBCR
40.0000 meq | EXTENDED_RELEASE_TABLET | Freq: Once | ORAL | Status: AC
  Administered 2020-09-28: 40 meq via ORAL
  Filled 2020-09-28: qty 2

## 2020-09-28 MED ORDER — LIDOCAINE 2% (20 MG/ML) 5 ML SYRINGE
INTRAMUSCULAR | Status: DC | PRN
  Administered 2020-09-28: 80 mg via INTRAVENOUS

## 2020-09-28 MED ORDER — DOFETILIDE 250 MCG PO CAPS: 250.0000 ug | ORAL_CAPSULE | Freq: Two times a day (BID) | ORAL | 6 refills | Status: DC

## 2020-09-28 MED ORDER — HYDROCORTISONE 1 % EX CREA
1.0000 "application " | TOPICAL_CREAM | Freq: Three times a day (TID) | CUTANEOUS | Status: DC | PRN
  Filled 2020-09-28: qty 28

## 2020-09-28 MED ORDER — SODIUM CHLORIDE 0.9 % IV SOLN: INTRAVENOUS | Status: DC | PRN

## 2020-09-28 MED ORDER — PROPOFOL 10 MG/ML IV BOLUS
INTRAVENOUS | Status: DC | PRN
  Administered 2020-09-28: 80 mg via INTRAVENOUS

## 2020-09-28 NOTE — Progress Notes (Signed)
d  Progress Note  Patient Name: Sean Miranda Date of Encounter: 09/28/2020  [facility] Cardiologist: Carlyle Dolly, MD   Subjective   NAEO. Still in AF.  Inpatient Medications    Scheduled Meds: . apixaban  5 mg Oral BID  . dofetilide  250 mcg Oral BID  . magnesium oxide  400 mg Oral Daily  . pravastatin  20 mg Oral Once per day on Mon Wed Fri  . sodium chloride flush  3 mL Intravenous Q12H   Continuous Infusions: . sodium chloride     PRN Meds: sodium chloride, nitroGLYCERIN, sodium chloride flush   Vital Signs    Vitals:   09/27/20 0629 09/27/20 1654 09/27/20 2033 09/28/20 0532  BP: 112/71 139/84 104/63 (!) 115/59  Pulse: 78 93 78 87  Resp: 18 16 15 16   Temp: 97.8 F (36.6 C) 97.6 F (36.4 C) 97.9 F (36.6 C) 97.9 F (36.6 C)  TempSrc: Oral Oral Oral Oral  SpO2: 96% 96% 100% 97%  Weight: 83.2 kg   82.7 kg  Height: 6' (1.829 m)       Intake/Output Summary (Last 24 hours) at 09/28/2020 0647 Last data filed at 09/27/2020 2033 Gross per 24 hour  Intake 120 ml  Output --  Net 120 ml   Last 3 Weights 09/28/2020 09/27/2020 09/26/2020  Weight (lbs) 182 lb 4.8 oz 183 lb 6.4 oz 186 lb 8 oz  Weight (kg) 82.691 kg 83.19 kg 84.596 kg      Telemetry    AFib  80's - Personally Reviewed  ECG    AFib 72bpm, reviewed with Dr. Lovena Le, QTc stable - Personally Reviewed  Physical Exam   GEN: No acute distress.   Neck: No JVD Cardiac: irreg-irreg, no murmurs, rubs, or gallops.  Respiratory: CTA b/l GI: Soft, nontender, non-distended  MS: No edema; No deformity. Neuro:  Nonfocal  Psych: Normal affect   Labs    High Sensitivity Troponin:  No results for input(s): TROPONINIHS in the last 720 hours.    Chemistry Recent Labs  Lab 09/26/20 1034 09/27/20 0143 09/28/20 0123  NA 136 136 136  K 5.0 4.0 3.8  CL 102 106 103  CO2 27 23 26   GLUCOSE 92 85 90  BUN 15 14 13   CREATININE 1.03 1.02 0.95  CALCIUM 9.3 8.9 9.1  GFRNONAA >60 >60 >60   ANIONGAP 7 7 7      HematologyNo results for input(s): WBC, RBC, HGB, HCT, MCV, MCH, MCHC, RDW, PLT in the last 168 hours.  BNPNo results for input(s): BNP, PROBNP in the last 168 hours.   DDimer No results for input(s): DDIMER in the last 168 hours.   Radiology    No results found.  Cardiac Studies    09/05/2020: TTE IMPRESSIONS  1. Left ventricular ejection fraction, by estimation, is 65 to 70%. The  left ventricle has normal function. The left ventricle has no regional  wall motion abnormalities. Left ventricular diastolic parameters are  indeterminate.   2. Right ventricular systolic function is normal. The right ventricular  size is normal.   3. Left atrial size was mild to moderately dilated.   4. The mitral valve is normal in structure. No evidence of mitral valve  regurgitation. No evidence of mitral stenosis.   5. The aortic valve was not well visualized. There is mild calcification  of the aortic valve. There is mild thickening of the aortic valve. Aortic  valve regurgitation is not visualized. No aortic stenosis is present.  6. The inferior vena cava is normal in size with greater than 50%  respiratory variability, suggesting right atrial pressure of 3 mmHg.    10/2019 nuclear stress  Unable to achieve adequate heart rate response with GXT, Lexiscan utilized. No diagnostic ST segment changes.  Small, mild intensity, inferior/inferoseptal defect extending from apex to base that is fixed (actually more prominent at rest) and consistent with soft tissue attenuation. No reversible defects to indicate ischemia.  This is a low risk study.  Nuclear stress EF: 69%.    05/2017 cath    Mid RCA lesion, 50 %stenosed.  Ost RCA lesion, 25 %stenosed. Lesion noted in small RV marginal.  LM lesion, 25 %stenosed.  2nd Diag lesion, 40 %stenosed.  Mid LAD-1 lesion, 20 %stenosed.  Mid LAD-2 lesion, 95 %stenosed.  A STENT SYNERGY DES 2.5X16 drug eluting stent was  successfully placed, postdilated to 3.0 mm.  Post intervention, there is a 0% residual stenosis.  The left ventricular systolic function is normal.  LV end diastolic pressure is moderately elevated.  The left ventricular ejection fraction is 55-65% by visual estimate.  There is no aortic valve stenosis.           Patient Profile     86 y.o. male w/PMHx of CAD (PCI 2018), PVD (R CEA 2014), HLD, and AFib admitted for Tikosyn initiation  Assessment & Plan    1. Longstanding persistent AFib     CHA2DS2Vasc is 3, on Eliqus appropriately dosed, pt reported no missed doses prior to arrival     Tikosyn load is in progress     QTc 410 after 10/26 PM dose. DCCV today.   2. CAD 3. PVD  For questions or updates, please contact [place] Please consult [email or web address] for contact info under     [employer]. Quentin Ore, MD, [facility] Cardiac Electrophysiology

## 2020-09-28 NOTE — Discharge Instructions (Signed)
Electrical Cardioversion Electrical cardioversion is the delivery of a jolt of electricity to restore a normal rhythm to the heart. A rhythm that is too fast or is not regular keeps the heart from pumping well. In this procedure, sticky patches or metal paddles are placed on the chest to deliver electricity to the heart from a device. This procedure may be done in an emergency if:  There is low or no blood pressure as a result of the heart rhythm.  Normal rhythm must be restored as fast as possible to protect the brain and heart from further damage.  It may save a life. This may also be a scheduled procedure for irregular or fast heart rhythms that are not immediately life-threatening. Follow these instructions at home:  Do not drive for 24 hours if you were given a sedative during your procedure.  Take over-the-counter and prescription medicines only as told by your health care provider.  Ask your health care provider how to check your pulse. Check it often.  Rest for 48 hours after the procedure or as told by your health care provider.  Avoid or limit your caffeine use as told by your health care provider.  Keep all follow-up visits as told by your health care provider. This is important. Contact a health care provider if:  You feel like your heart is beating too quickly or your pulse is not regular.  You have a serious muscle cramp that does not go away. Get help right away if:  You have discomfort in your chest.  You are dizzy or you feel faint.  You have trouble breathing or you are short of breath.  Your speech is slurred.  You have trouble moving an arm or leg on one side of your body.  Your fingers or toes turn cold or blue. Summary  Electrical cardioversion is the delivery of a jolt of electricity to restore a normal rhythm to the heart.  This procedure may be done right away in an emergency or may be a scheduled procedure if the condition is not an  emergency.  Generally, this is a safe procedure.  After the procedure, check your pulse often as told by your health care provider. This information is not intended to replace advice given to you by your health care provider. Make sure you discuss any questions you have with your health care provider. Document Revised: 06/22/2019 Document Reviewed: 06/22/2019 Elsevier Patient Education  2020 Elsevier Inc.  

## 2020-09-28 NOTE — H&P (View-Only) (Signed)
d  Progress Note  Patient Name: Sean Miranda Date of Encounter: 09/28/2020  [facility] Cardiologist: Carlyle Dolly, MD   Subjective   NAEO. Still in AF.  Inpatient Medications    Scheduled Meds: . apixaban  5 mg Oral BID  . dofetilide  250 mcg Oral BID  . magnesium oxide  400 mg Oral Daily  . pravastatin  20 mg Oral Once per day on Mon Wed Fri  . sodium chloride flush  3 mL Intravenous Q12H   Continuous Infusions: . sodium chloride     PRN Meds: sodium chloride, nitroGLYCERIN, sodium chloride flush   Vital Signs    Vitals:   09/27/20 0629 09/27/20 1654 09/27/20 2033 09/28/20 0532  BP: 112/71 139/84 104/63 (!) 115/59  Pulse: 78 93 78 87  Resp: 18 16 15 16   Temp: 97.8 F (36.6 C) 97.6 F (36.4 C) 97.9 F (36.6 C) 97.9 F (36.6 C)  TempSrc: Oral Oral Oral Oral  SpO2: 96% 96% 100% 97%  Weight: 83.2 kg   82.7 kg  Height: 6' (1.829 m)       Intake/Output Summary (Last 24 hours) at 09/28/2020 0647 Last data filed at 09/27/2020 2033 Gross per 24 hour  Intake 120 ml  Output --  Net 120 ml   Last 3 Weights 09/28/2020 09/27/2020 09/26/2020  Weight (lbs) 182 lb 4.8 oz 183 lb 6.4 oz 186 lb 8 oz  Weight (kg) 82.691 kg 83.19 kg 84.596 kg      Telemetry    AFib  80's - Personally Reviewed  ECG    AFib 72bpm, reviewed with Dr. Lovena Le, QTc stable - Personally Reviewed  Physical Exam   GEN: No acute distress.   Neck: No JVD Cardiac: irreg-irreg, no murmurs, rubs, or gallops.  Respiratory: CTA b/l GI: Soft, nontender, non-distended  MS: No edema; No deformity. Neuro:  Nonfocal  Psych: Normal affect   Labs    High Sensitivity Troponin:  No results for input(s): TROPONINIHS in the last 720 hours.    Chemistry Recent Labs  Lab 09/26/20 1034 09/27/20 0143 09/28/20 0123  NA 136 136 136  K 5.0 4.0 3.8  CL 102 106 103  CO2 27 23 26   GLUCOSE 92 85 90  BUN 15 14 13   CREATININE 1.03 1.02 0.95  CALCIUM 9.3 8.9 9.1  GFRNONAA >60 >60 >60   ANIONGAP 7 7 7      HematologyNo results for input(s): WBC, RBC, HGB, HCT, MCV, MCH, MCHC, RDW, PLT in the last 168 hours.  BNPNo results for input(s): BNP, PROBNP in the last 168 hours.   DDimer No results for input(s): DDIMER in the last 168 hours.   Radiology    No results found.  Cardiac Studies    09/05/2020: TTE IMPRESSIONS  1. Left ventricular ejection fraction, by estimation, is 65 to 70%. The  left ventricle has normal function. The left ventricle has no regional  wall motion abnormalities. Left ventricular diastolic parameters are  indeterminate.   2. Right ventricular systolic function is normal. The right ventricular  size is normal.   3. Left atrial size was mild to moderately dilated.   4. The mitral valve is normal in structure. No evidence of mitral valve  regurgitation. No evidence of mitral stenosis.   5. The aortic valve was not well visualized. There is mild calcification  of the aortic valve. There is mild thickening of the aortic valve. Aortic  valve regurgitation is not visualized. No aortic stenosis is present.  6. The inferior vena cava is normal in size with greater than 50%  respiratory variability, suggesting right atrial pressure of 3 mmHg.    10/2019 nuclear stress  Unable to achieve adequate heart rate response with GXT, Lexiscan utilized. No diagnostic ST segment changes.  Small, mild intensity, inferior/inferoseptal defect extending from apex to base that is fixed (actually more prominent at rest) and consistent with soft tissue attenuation. No reversible defects to indicate ischemia.  This is a low risk study.  Nuclear stress EF: 69%.    05/2017 cath    Mid RCA lesion, 50 %stenosed.  Ost RCA lesion, 25 %stenosed. Lesion noted in small RV marginal.  LM lesion, 25 %stenosed.  2nd Diag lesion, 40 %stenosed.  Mid LAD-1 lesion, 20 %stenosed.  Mid LAD-2 lesion, 95 %stenosed.  A STENT SYNERGY DES 2.5X16 drug eluting stent was  successfully placed, postdilated to 3.0 mm.  Post intervention, there is a 0% residual stenosis.  The left ventricular systolic function is normal.  LV end diastolic pressure is moderately elevated.  The left ventricular ejection fraction is 55-65% by visual estimate.  There is no aortic valve stenosis.           Patient Profile     86 y.o. male w/PMHx of CAD (PCI 2018), PVD (R CEA 2014), HLD, and AFib admitted for Tikosyn initiation  Assessment & Plan    1. Longstanding persistent AFib     CHA2DS2Vasc is 3, on Eliqus appropriately dosed, pt reported no missed doses prior to arrival     Tikosyn load is in progress     QTc 410 after 10/26 PM dose. DCCV today.   2. CAD 3. PVD  For questions or updates, please contact [place] Please consult [email or web address] for contact info under     [employer]. Quentin Ore, MD, [facility] Cardiac Electrophysiology

## 2020-09-28 NOTE — Interval H&P Note (Signed)
History and Physical Interval Note:  09/28/2020 2:28 PM  Sean Miranda  has presented today for surgery, with the diagnosis of afib.  The various methods of treatment have been discussed with the patient and family. After consideration of risks, benefits and other options for treatment, the patient has consented to  Procedure(s): CARDIOVERSION (N/A) as a surgical intervention.  The patient's history has been reviewed, patient examined, no change in status, stable for surgery.  I have reviewed the patient's chart and labs.  Questions were answered to the patient's satisfaction.     [name], MD

## 2020-09-28 NOTE — TOC Benefit Eligibility Note (Deleted)
Transition of Care [facility]) Benefit Eligibility Note    Patient Details  Name: Sean Miranda MRN: [id number] Date of Birth: 1929-12-25   Medication/Dose: Tikosyn 250.mg bid  Covered?: Yes  Tier: Other (4)  Prescription Coverage Preferred Pharmacy: [facility] with Person/Company/Phone Number:: Cleotis Nipper A. W/[employer] PH# [phone]  Co-Pay: $35.00  Prior Approval: Yes [phone])          Shelda Altes Phone Number: 09/28/2020, 2:20 PM

## 2020-09-28 NOTE — TOC Benefit Eligibility Note (Signed)
Transition of Care [facility]) Benefit Eligibility Note    Patient Details  Name: LEEON MAKAR MRN: [id number] Date of Birth: 05-Sep-1930   Medication/Dose: Defetilide 250 mg. bid  Covered?: Yes  Tier:  (4)  Prescription Coverage [facility]erred Pharm[facility]: [place]  Spoke with Person/Company/Phone Number:: Roxanne D. W/[employer]  PH# [phone]  Co-Pay: $56.65  Prior Approval: No  Deductible: Met       Shelda Altes Phone Number: 09/28/2020, 4:34 PM

## 2020-09-28 NOTE — Care Management (Signed)
Case manager submitted to [facility]  benefits check request for Tikosyn copay. TOC Team will continue to monitor.  Ricki Miller, RN BSN Case Manager [phone]

## 2020-09-28 NOTE — CV Procedure (Signed)
Electrical Cardioversion Procedure Note Sean Miranda [id number] 01-09-1930  Procedure: Electrical Cardioversion Indications:  Atrial Fibrillation  Procedure Details Consent: Risks of procedure as well as the alternatives and risks of each were explained to the (patient/caregiver).  Consent for procedure obtained. Time Out: Verified patient identification, verified procedure, site/side was marked, verified correct patient position, special equipment/implants available, medications/allergies/relevent history reviewed, required imaging and test results available.  Performed  Patient placed on cardiac monitor, pulse oximetry, supplemental oxygen as necessary.  Sedation given: propofol Pacer pads placed anterior and posterior chest.  Cardioverted 1 time(s).  Cardioverted at [place].  Evaluation Findings: Post procedure EKG shows: NSR Complications: None Patient did tolerate procedure well.   Sean Latch, MD 09/28/2020, 2:54 PM

## 2020-09-28 NOTE — Anesthesia Postprocedure Evaluation (Signed)
Anesthesia Post Note  Patient: Sean Miranda  Procedure(s) Performed: CARDIOVERSION (N/A )     Patient location during evaluation: PACU Anesthesia Type: General Level of consciousness: awake and alert, oriented and patient cooperative Pain management: pain level controlled Vital Signs Assessment: post-procedure vital signs reviewed and stable Respiratory status: spontaneous breathing, nonlabored ventilation and respiratory function stable Cardiovascular status: blood pressure returned to baseline and stable Postop Assessment: no apparent nausea or vomiting Anesthetic complications: no   No complications documented.  Last Vitals:  Vitals:   09/28/20 1500 09/28/20 1510  BP: 117/65 135/60  Pulse: 62 62  Resp: 17 14  Temp:    SpO2: 99% 100%    Last Pain:  Vitals:   09/28/20 1520  TempSrc:   PainSc: 0-No pain                 Pervis Hocking

## 2020-09-28 NOTE — Anesthesia Preprocedure Evaluation (Addendum)
Anesthesia Evaluation  Patient identified by MRN, date of birth, ID band Patient awake    Reviewed: Allergy & Precautions, NPO status , Patient's Chart, lab work & pertinent test results  History of Anesthesia Complications Negative for: history of anesthetic complications  Airway Mallampati: III  TM Distance: >3 FB Neck ROM: Full    Dental no notable dental hx. (+) Teeth Intact, Dental Advisory Given   Pulmonary former smoker,  18 pack year history, quit 1974   Pulmonary exam normal breath sounds clear to auscultation       Cardiovascular hypertension, + CAD, + Cardiac Stents and + Peripheral Vascular Disease (s/p rt CEA)  Normal cardiovascular exam+ dysrhythmias Atrial Fibrillation  Rhythm:Regular Rate:Normal   '21 TTE - EF 65 to 70%. Left atrial size was mild to moderately dilated    Neuro/Psych R CEA 2014 negative psych ROS   GI/Hepatic negative GI ROS, Neg liver ROS,   Endo/Other  negative endocrine ROS  Renal/GU negative Renal ROS  negative genitourinary   Musculoskeletal  (+) Arthritis , Osteoarthritis,    Abdominal   Peds  Hematology negative hematology ROS (+)  On eliquis    Anesthesia Other Findings Covid test negative   Reproductive/Obstetrics negative OB ROS                           Anesthesia Physical Anesthesia Plan  ASA: III  Anesthesia Plan: General   Post-op Pain Management:    Induction: Intravenous  PONV Risk Score and Plan: 2 and Treatment may vary due to age or medical condition and Propofol infusion  Airway Management Planned: Mask and Natural Airway  Additional Equipment: None  Intra-op Plan:   Post-operative Plan:   Informed Consent: I have reviewed the patients History and Physical, chart, labs and discussed the procedure including the risks, benefits and alternatives for the proposed anesthesia with the patient or authorized representative who  has indicated his/her understanding and acceptance.       Plan Discussed with: CRNA and Anesthesiologist  Anesthesia Plan Comments:        Anesthesia Quick Evaluation

## 2020-09-28 NOTE — Transfer of Care (Signed)
Immediate Anesthesia Transfer of Care Note  Patient: Sean Miranda  Procedure(s) Performed: CARDIOVERSION (N/A )  Patient Location: Endoscopy Unit  Anesthesia Type:General  Level of Consciousness: drowsy, patient cooperative and responds to stimulation  Airway & Oxygen Therapy: Patient Spontanous Breathing and Patient connected to nasal cannula oxygen  Post-op Assessment: Report given to RN and Post -op Vital signs reviewed and stable  Post vital signs: Reviewed and stable  Last Vitals:  Vitals Value Taken Time  BP    Temp    Pulse    Resp    SpO2      Last Pain:  Vitals:   09/28/20 1415  TempSrc: Oral  PainSc: 0-No pain      Patients Stated Pain Goal: 0 (05/04/55 1537)  Complications: No complications documented.

## 2020-09-29 ENCOUNTER — Other Ambulatory Visit: Payer: Self-pay

## 2020-09-29 LAB — BASIC METABOLIC PANEL
Anion gap: 7 (ref 5–15)
BUN: 17 mg/dL (ref 8–23)
CO2: 26 mmol/L (ref 22–32)
Calcium: 9 mg/dL (ref 8.9–10.3)
Chloride: 103 mmol/L (ref 98–111)
Creatinine, Ser: 1.19 mg/dL (ref 0.61–1.24)
GFR, Estimated: 59 mL/min — ABNORMAL LOW (ref >60)
Glucose, Bld: 96 mg/dL (ref 70–99)
Potassium: 4.2 mmol/L (ref 3.5–5.1)
Sodium: 136 mmol/L (ref 135–145)

## 2020-09-29 LAB — MAGNESIUM: Magnesium: 2.2 mg/dL (ref 1.7–2.4)

## 2020-09-29 MED ORDER — DOFETILIDE 250 MCG PO CAPS: 250.0000 ug | ORAL_CAPSULE | Freq: Two times a day (BID) | ORAL | 6 refills | Status: DC

## 2020-09-29 NOTE — Discharge Summary (Signed)
ELECTROPHYSIOLOGY PROCEDURE DISCHARGE SUMMARY    Patient ID: Sean Miranda,  MRN: [id number], DOB/AGE: 1930/06/13 86 y.o.  Admit date: 09/26/2020 Discharge date: 09/29/2020  Primary Care Physician: Asencion Noble, MD Primary Cardiologist: Dr. Harl Bowie Electrophysiologist: Dr. Lovena Le (new)  Primary Discharge Diagnosis:  1.  Longstanding persistent atrial fibrillation status post Tikosyn loading this admission      CHA2DS2Vasc is 3, on Eliquis, appropriately dosed  Secondary Discharge Diagnosis:  1. CAD    H/o PCI 2018 2. PVD     H/p R CEA 2014 3. HLD  Allergies  Allergen Reactions  . Lipitor [Atorvastatin Calcium] Other (See Comments)    Muscle pain  . Vantin [Cefpodoxime] Rash     Procedures This Admission:  1.  Tikosyn loading 2.  Direct current cardioversion on 09/28/2020 by Dr Oval Linsey which successfully restored SR.  There were no early apparent complications.   Brief HPI: Sean Miranda is a 86 y.o. male with a past medical history as noted above.  He was referred to the AFib clinic in the outpatient setting for treatment options of atrial fibrillation.  Risks, benefits, and alternatives to Tikosyn were reviewed with the patient who wished to proceed.    Hospital Course:  The patient was admitted and Tikosyn was initiated.  Renal function and electrolytes were followed during the hospitalization.  His does appropriate for his renal function.  The patient's QTc remained stable.  On 09/28/20 the patient underwent direct current cardioversion which restored sinus rhythm.  he was monitored until discharge on telemetry which demonstrated post DCCV he had about 5-10min of rate controlled AF that converted back to SB/SR 50's-60's w/PACs.  On the day of discharge, he feels well, was examined by Dr Quentin Ore who considered the patient stable for discharge to home.  Follow-up has been arranged with the AFib clinic in 1 week and with Dr Lovena Le in 4 weeks.   Tikosyn teaching was  completed  I have personally called his pharmacy, they have dofetilide 262mcg in stock   Physical Exam: Vitals:   09/28/20 1510 09/28/20 1705 09/28/20 2006 09/29/20 0500  BP: 135/60 111/61 122/72 (!) 114/58  Pulse: 62 (!) 59  61  Resp: 14 16  16   Temp:  98.2 F (36.8 C) 97.6 F (36.4 C) 97.6 F (36.4 C)  TempSrc:  Oral  Oral  SpO2: 100% 100%    Weight:    82.5 kg  Height:         GEN- The patient is well appearing, alert and oriented x 3 today.   HEENT: normocephalic, atraumatic; sclera clear, conjunctiva pink; hearing intact; oropharynx clear; neck supple, no JVP Lymph- no cervical lymphadenopathy Lungs- CTA b/l, normal work of breathing.  No wheezes, rales, rhonchi Heart- RRR, no murmurs, rubs or gallops, PMI not laterally displaced GI- soft, non-tender, non-distended Extremities- no clubbing, cyanosis, or edema MS- no significant deformity or atrophy Skin- warm and dry, no rash or lesion Psych- euthymic mood, full affect Neuro- strength and sensation are intact   Labs:   Lab Results  Component Value Date   WBC 6.9 07/18/2020   HGB 14.5 07/18/2020   HCT 43.4 07/18/2020   MCV 95.4 07/18/2020   PLT 201 07/18/2020    Recent Labs  Lab 09/29/20 0142  NA 136  K 4.2  CL 103  CO2 26  BUN 17  CREATININE 1.19  CALCIUM 9.0  GLUCOSE 96     Discharge Medications:  Allergies as of 09/29/2020  Reactions   Lipitor [atorvastatin Calcium] Other (See Comments)   Muscle pain   Vantin [cefpodoxime] Rash      Medication List    TAKE these medications   ALPHA LIPOIC ACID PO Take 300 mg by mouth daily.   coenzyme Q10-levOCARNitine 100-20 MG Caps Take 1 capsule by mouth daily.   COQ10 PO Take 2 capsules by mouth daily.   dofetilide 250 MCG capsule Commonly known as: TIKOSYN Take 1 capsule (250 mcg total) by mouth 2 (two) times daily.   Eliquis 5 MG Tabs tablet Generic drug: apixaban TAKE (1) TABLET BY MOUTH TWICE DAILY. What changed: See the new  instructions.   FISH OIL PO Take 2 capsules by mouth daily.   Glucosamine-Chondroitin 500-400 MG Caps Take 1 capsule by mouth daily.   Magnesium 500 MG Tabs Take 500 mg by mouth daily.   multivitamin with minerals Tabs tablet Take 1 tablet by mouth daily.   nitroGLYCERIN 0.4 MG SL tablet Commonly known as: NITROSTAT Place 1 tablet (0.4 mg total) under the tongue every 5 (five) minutes as needed for chest pain.   pravastatin 40 MG tablet Commonly known as: PRAVACHOL TAKE 1/2 TABLET BY MOUTH EVERY MONDAY, WEDNESDAY AND FRIDAY. What changed: See the new instructions.   PROBIOTIC DAILY PO Take 1 capsule by mouth daily.   sildenafil 20 MG tablet Commonly known as: REVATIO TAKE 2 TO 3 TABLETS BY MOUTH AS NEEDED FOR SEXUAL ACTIVITY What changed:   how much to take  how to take this  when to take this  reasons to take this  additional instructions   Vitamin D3 50 MCG (2000 UT) capsule Take 2,000 Units by mouth daily.       Disposition: Home Discharge Instructions    Diet - low sodium heart healthy   Complete by: As directed    Increase activity slowly   Complete by: As directed       Follow-up Information    [facility] Follow up.   Specialty: Cardiology Why: 10/05/2020 @ 1:30PM with Marcene Brawn, PA Contact information: [address] [id number] [place]       Evans Lance, MD Follow up.   Specialty: Cardiology Why: 11/04/2020 @ 11:30AM Contact information: [address] [address] [phone]               Duration of Discharge Encounter: Greater than 30 minutes including physician time.  Venetia Night, PA-C 09/29/2020 10:58 AM

## 2020-09-29 NOTE — Progress Notes (Signed)
EKG and telemetry reviewed with Dr. Quentin Ore QT, rhythm stable Anticipate discharge later today  Tommye Standard, PA-C

## 2020-09-29 NOTE — Progress Notes (Signed)
Pharmacy: Dofetilide (Tikosyn) - Follow Up Assessment and Electrolyte Replacement  Pharmacy consulted to assist in monitoring and replacing electrolytes in this 86 y.o. male admitted on 09/26/2020 undergoing dofetilide initiation. First dofetilide dose: 250 mcg q12h.  Labs:    Component Value Date/Time   K 4.2 09/29/2020 0142   K 4.5 10/07/2019 0737   MG 2.2 09/29/2020 0142     Plan: Potassium: K >/= 4: No additional supplementation needed  Magnesium: Mg > 2: No additional supplementation needed  Thank you for allowing pharmacy to participate in this patient's care   Arrie Senate, PharmD, BCPS Clinical Pharmacist Please check AMION for all [facility] numbers 09/29/2020

## 2020-10-03 ENCOUNTER — Ambulatory Visit: Payer: Medicare Other | Admitting: Physician Assistant

## 2020-10-05 ENCOUNTER — Ambulatory Visit (HOSPITAL_COMMUNITY)
Admit: 2020-10-05 | Discharge: 2020-10-05 | Disposition: A | Discharge status: Home / Self Care | Payer: Medicare Other | Source: Ambulatory Visit | Attending: Physician Assistant | Admitting: Physician Assistant

## 2020-10-05 ENCOUNTER — Encounter (HOSPITAL_COMMUNITY): Payer: Self-pay | Admitting: Physician Assistant

## 2020-10-05 ENCOUNTER — Other Ambulatory Visit: Payer: Self-pay

## 2020-10-05 VITALS — BP 140/70 | HR 58 | Ht 72.0 in | Wt 189.8 lb

## 2020-10-05 DIAGNOSIS — I2510 Atherosclerotic heart disease of native coronary artery without angina pectoris: Secondary | ICD-10-CM | POA: Diagnosis not present

## 2020-10-05 DIAGNOSIS — Z7901 Long term (current) use of anticoagulants: Secondary | ICD-10-CM | POA: Diagnosis not present

## 2020-10-05 DIAGNOSIS — Z79899 Other long term (current) drug therapy: Secondary | ICD-10-CM | POA: Diagnosis not present

## 2020-10-05 DIAGNOSIS — I4811 Longstanding persistent atrial fibrillation: Secondary | ICD-10-CM | POA: Insufficient documentation

## 2020-10-05 DIAGNOSIS — Z8249 Family history of ischemic heart disease and other diseases of the circulatory system: Secondary | ICD-10-CM | POA: Insufficient documentation

## 2020-10-05 DIAGNOSIS — E785 Hyperlipidemia, unspecified: Secondary | ICD-10-CM | POA: Diagnosis not present

## 2020-10-05 DIAGNOSIS — D6869 Other thrombophilia: Secondary | ICD-10-CM | POA: Diagnosis not present

## 2020-10-05 DIAGNOSIS — Z87891 Personal history of nicotine dependence: Secondary | ICD-10-CM | POA: Insufficient documentation

## 2020-10-05 LAB — MAGNESIUM: Magnesium: 2.4 mg/dL (ref 1.7–2.4)

## 2020-10-05 LAB — BASIC METABOLIC PANEL
Anion gap: 10 (ref 5–15)
BUN: 17 mg/dL (ref 8–23)
CO2: 24 mmol/L (ref 22–32)
Calcium: 9.2 mg/dL (ref 8.9–10.3)
Chloride: 99 mmol/L (ref 98–111)
Creatinine, Ser: 1 mg/dL (ref 0.61–1.24)
GFR, Estimated: >60 mL/min (ref >60)
Glucose, Bld: 96 mg/dL (ref 70–99)
Potassium: 3.9 mmol/L (ref 3.5–5.1)
Sodium: 133 mmol/L — ABNORMAL LOW (ref 135–145)

## 2020-10-05 NOTE — Progress Notes (Signed)
Primary Care Physician: Asencion Noble, MD Primary Cardiologist: Dr Harl Bowie Primary Electrophysiologist: Dr Lovena Le Referring Physician: Stephanie Coup [place] is a 86 y.o. male with a history of CAD, carotid artery disease, HLD, and longstanding persistent atrial fibrillation who presents for follow up in the [facility]. The patient was initially diagnosed with atrial fibrillation 01/2017 after presenting with symptoms of chest perssure. Patient is on Eliquis for a CHADS2VASC score of 3. Due to continued dyspnea attempts were made to get him back into NSR, including multiple cardioversions and starting flecanide. Had tolerated lopressor at the time but stopped taking due to HRs in 40s or 50s. Flecainide was discontinued when 2/2 CAD. Last ECG with SR was 02/07/19. Patient reports that he has had symptoms of increased fatigue over the last couple months. He denies CP, SOB, dizziness, or palpitations.   On follow up today, patient is s/p dofetilide loading 10/25-10/28/21. He underwent DCCV on 09/28/20. Patient reports that his fatigue has minimally improved with SR. He is tolerating the medication without difficulty. He denies bleeding issues on anticoagulation.   Today, he denies symptoms of palpitations, chest pain, shortness of breath, orthopnea, PND, lower extremity edema, dizziness, presyncope, syncope, snoring, daytime somnolence, bleeding, or neurologic sequela. The patient is tolerating medications without difficulties and is otherwise without complaint today.    Atrial Fibrillation Risk Factors:  he does not have symptoms or diagnosis of sleep apnea. he does not have a history of rheumatic fever.   he has a BMI of Body mass index is 25.74 kg/m.Marland Kitchen Filed Weights   10/05/20 1352  Weight: 86.1 kg    Family History  Problem Relation Age of Onset  . Stroke Father   . Hypertension Mother   . Prostate cancer Son   . Stroke Brother   . Cancer  Brother        spine     Atrial Fibrillation Management history:  Previous antiarrhythmic drugs: flecainide, dofetilide  Previous cardioversions: 2018, 09/28/20 Previous ablations: none CHADS2VASC score: 3 Anticoagulation history: Eliquis   Past Medical History:  Diagnosis Date  . Arthritis    neck   . Atrial fibrillation ([place])    a. new-onset in 01/2017. Started on Eliquis  . CAD (coronary artery disease), native coronary artery 05/16/2017   LAD 95%>>0 w/ 2.5 x 16 mm Synergy DES, D2 40%, RCA 50%, EF 55-65%, LVEDP mod elevated  . Calcification of coronary artery    a. mild, nonobstructive CAD by cath in 2009.  Marland Kitchen Cancer of skin of chest   . Carotid artery occlusion    a. s/p R CEA in 2014  . Colon polyps   . Hyperlipidemia    takes Pravastatin every other day  . Pneumonia ~ 01/2013   Past Surgical History:  Procedure Laterality Date  . CARDIAC CATHETERIZATION  06/09/1999   mild CAD (LAD, diagonal, RCA) - Dr. Loni Muse. [place]  . CARDIOVERSION N/A 03/25/2017   Procedure: CARDIOVERSION;  Surgeon: Pixie Casino, MD;  Location: [facility] ENDOSCOPY;  Service: Cardiovascular;  Laterality: N/A;  . CARDIOVERSION N/A 09/28/2020   Procedure: CARDIOVERSION;  Surgeon: [name], MD;  Location: [facility] ENDOSCOPY;  Service: Cardiovascular;  Laterality: N/A;  . CAROTID DOPPLER  02/2012   60-79% RICA stenosis, 54-62% LICA stenosis (prior to carotid endarterectomy)  . COLONOSCOPY    . CORONARY STENT INTERVENTION N/A 05/16/2017   Procedure: Coronary Stent Intervention;  Surgeon: Jettie Booze, MD;  Location: [facility] CV LAB;  Service: Cardiovascular;  Laterality: N/A;  . ENDARTERECTOMY Right 11/10/2013   Procedure: ENDARTERECTOMY CAROTID;  Surgeon: Conrad Kenesaw, MD;  Location: [facility];  Service: Vascular;  Laterality: Right;  . INGUINAL HERNIA REPAIR Left   . LEFT HEART CATH AND CORONARY ANGIOGRAPHY N/A 05/16/2017   Procedure: Left Heart Cath and Coronary Angiography;  Surgeon: Jettie Booze, MD;  Location: [facility] CV LAB;  Service: Cardiovascular;  Laterality: N/A;  . MOLE REMOVAL  1990s   "chest"  . NM MYOCAR PERF WALL MOTION  04/2011   bruce myoview - perfusion defect in inferior myocardium (diaphragmatic attenuation), remaining myocardium with normal perfusion, EF 67%  . PATCH ANGIOPLASTY Right 11/10/2013   Procedure: PATCH ANGIOPLASTY;  Surgeon: Conrad Edison, MD;  Location: [facility];  Service: Vascular;  Laterality: Right;  . PILONIDAL CYST DRAINAGE    . SHOULDER OPEN ROTATOR CUFF REPAIR Bilateral 1998 - 2001  . SKIN CANCER EXCISION  ~ 2015   "chest"  . TONSILLECTOMY      Current Outpatient Medications  Medication Sig Dispense Refill  . ALPHA LIPOIC ACID PO Take 300 mg by mouth daily.    . Cholecalciferol (VITAMIN D3) 2000 UNITS capsule Take 2,000 Units by mouth daily.     . Coenzyme Q10 (COQ10 PO) Take 2 capsules by mouth daily.    Marland Kitchen dofetilide (TIKOSYN) 250 MCG capsule Take 1 capsule (250 mcg total) by mouth 2 (two) times daily. 60 capsule 6  . ELIQUIS 5 MG TABS tablet TAKE (1) TABLET BY MOUTH TWICE DAILY. 180 tablet 1  . Glucosamine-Chondroitin 500-400 MG CAPS Take 1 capsule by mouth daily.     . Magnesium 500 MG TABS Take 500 mg by mouth daily.     . Multiple Vitamin (MULTIVITAMIN WITH MINERALS) TABS tablet Take 1 tablet by mouth daily.    . nitroGLYCERIN (NITROSTAT) 0.4 MG SL tablet Place 1 tablet (0.4 mg total) under the tongue every 5 (five) minutes as needed for chest pain. 30 tablet 0  . Omega-3 Fatty Acids (FISH OIL PO) Take 2 capsules by mouth daily.    . pravastatin (PRAVACHOL) 40 MG tablet TAKE 1/2 TABLET BY MOUTH EVERY MONDAY, WEDNESDAY AND FRIDAY. 36 tablet 3  . Probiotic Product (PROBIOTIC DAILY PO) Take 1 capsule by mouth daily.    . sildenafil (REVATIO) 20 MG tablet TAKE 2 TO 3 TABLETS BY MOUTH AS NEEDED FOR SEXUAL ACTIVITY 60 tablet 4   No current facility-administered medications for this encounter.    Allergies  Allergen Reactions  .  Lipitor [Atorvastatin Calcium] Other (See Comments)    Muscle pain  . Vantin [Cefpodoxime] Rash    Social History   Socioeconomic History  . Marital status: Married    Spouse name: Not on file  . Number of children: 3  . Years of education: Not on file  . Highest education level: Not on file  Occupational History    Employer: [employer]  Tobacco Use  . Smoking status: Former Smoker    Packs/day: 1.00    Years: 18.00    Pack years: 18.00    Types: Cigarettes    Quit date: 12/03/1972    Years since quitting: 47.8  . Smokeless tobacco: Never Used  Vaping Use  . Vaping Use: Never used  Substance and Sexual Activity  . Alcohol use: Yes    Alcohol/week: 14.0 standard drinks    Types: 7 Glasses of wine, 7 Shots of liquor per week  . Drug use: No  .  Sexual activity: Yes  Other Topics Concern  . Not on file  Social History Narrative  . Not on file   Social Determinants of Health   Financial Resource Strain:   . Difficulty of Paying Living Expenses: Not on file  Food Insecurity:   . Worried About Charity fundraiser in the Last Year: Not on file  . Ran Out of Food in the Last Year: Not on file  Transportation Needs:   . Lack of Transportation (Medical): Not on file  . Lack of Transportation (Non-Medical): Not on file  Physical Activity:   . Days of Exercise per Week: Not on file  . Minutes of Exercise per Session: Not on file  Stress:   . Feeling of Stress : Not on file  Social Connections:   . Frequency of Communication with Friends and Family: Not on file  . Frequency of Social Gatherings with Friends and Family: Not on file  . Attends Religious Services: Not on file  . Active Member of Clubs or Organizations: Not on file  . Attends Archivist Meetings: Not on file  . Marital Status: Not on file  Intimate Partner Violence:   . Fear of Current or Ex-Partner: Not on file  . Emotionally Abused: Not on file  . Physically Abused: Not on file  . Sexually  Abused: Not on file     ROS- All systems are reviewed and negative except as per the HPI above.  Physical Exam: Vitals:   10/05/20 1352  BP: 140/70  Pulse: (!) 58  Weight: 86.1 kg  Height: 6' (1.829 m)    GEN- The patient is well appearing elderly male, alert and oriented x 3 today.   HEENT-head normocephalic, atraumatic, sclera clear, conjunctiva pink, hearing intact, trachea midline. Lungs- Clear to ausculation bilaterally, normal work of breathing Heart- Regular rate and rhythm, no murmurs, rubs or gallops  GI- soft, NT, ND, + BS Extremities- no clubbing, cyanosis, or edema MS- no significant deformity or atrophy Skin- no rash or lesion Psych- euthymic mood, full affect Neuro- strength and sensation are intact   Wt Readings from Last 3 Encounters:  10/05/20 86.1 kg  09/29/20 82.5 kg  09/26/20 85.8 kg    EKG today demonstrates SB HR 58, PR 196, QRS 96, QTc 435  Echo 05/15/17 demonstrated  - Left ventricle: The cavity size was normal. Wall thickness was  increased in a pattern of mild LVH. Systolic function was normal.  The estimated ejection fraction was in the range of 60% to 65%.  Wall motion was normal; there were no regional wall motion  abnormalities. The study is not technically sufficient to allow  evaluation of LV diastolic function.  - Aortic valve: Mildly calcified annulus. Trileaflet; moderately  thickened leaflets. Valve area (VTI): 2.98 cm^2. Valve area  (Vmax): 2.7 cm^2. Valve area (Vmean): 2.56 cm^2.  - Left atrium: The atrium was mildly dilated.  - Technically adequate study.   Epic records are reviewed at length today  CHA2DS2-VASc Score = 3  The patient's score is based upon: CHF History: 0 HTN History: 0 Diabetes History: 0 Stroke History: 0 Vascular Disease History: 1      ASSESSMENT AND PLAN: 1. Longstanding Persistent Atrial Fibrillation (ICD10:  I48.11) The patient's CHA2DS2-VASc score is 3, indicating a 3.2% annual  risk of stroke.   S/p dofetilide loading 10/25-10/28/21 with DCCV on 09/28/20 Patient appears to be maintaining SR. Continue dofetilide 250 mcg BID. QT stable. Check bmet/mag today. Continue Eliquis  5 mg BID  2. Secondary Hypercoagulable State (ICD10:  D68.69) The patient is at significant risk for stroke/thromboembolism based upon his CHA2DS2-VASc Score of 3.  Continue Apixaban (Eliquis).   3. CAD No anginal symptoms.   Follow up with Dr Lovena Le and Dr Harl Bowie as scheduled.    [facility] [address] [place], [place] [address] [phone] 10/05/2020 2:24 PM

## 2020-10-18 ENCOUNTER — Telehealth: Payer: Self-pay | Admitting: Cardiology

## 2020-10-18 NOTE — Telephone Encounter (Signed)
Patient c/o Palpitations:  High priority if patient c/o lightheadedness, shortness of breath, or chest pain  1) How long have you had palpitations/irregular HR/ Afib? Are you having the symptoms now? Started today, is having now   2) Are you currently experiencing lightheadedness, SOB or CP? No   3) Do you have a history of afib (atrial fibrillation) or irregular heart rhythm? Yes   4) Have you checked your BP or HR? (document readings if available): 120's/58ish HR 98   5) Are you experiencing any other symptoms? No   Sean Miranda is calling stating he went back in to Afib today. He is wanting to know if he should continue taking dofetilide (TIKOSYN) 250 MCG capsule due to the Afib coming back, when the medication was supposed to prevent it.

## 2020-10-18 NOTE — Telephone Encounter (Signed)
I will forward to Dr.Branch for dispo 

## 2020-10-19 ENCOUNTER — Telehealth: Payer: Self-pay | Admitting: Cardiology

## 2020-10-19 ENCOUNTER — Other Ambulatory Visit: Payer: Self-pay

## 2020-10-19 ENCOUNTER — Ambulatory Visit (INDEPENDENT_AMBULATORY_CARE_PROVIDER_SITE_OTHER): Payer: Medicare Other | Admitting: *Deleted

## 2020-10-19 DIAGNOSIS — I4819 Other persistent atrial fibrillation: Secondary | ICD-10-CM

## 2020-10-19 NOTE — Telephone Encounter (Signed)
Can he come in for an ekg to confirm he is back in afib, if so then would forward on the info to afib clinic to get there recs   Zandra Abts MD

## 2020-10-19 NOTE — Progress Notes (Addendum)
Present to office for EKG per Dr. Harl Bowie request. Denies having any symptoms and says he feels fine.

## 2020-10-19 NOTE — Telephone Encounter (Signed)
New message      STAT if HR is under 50 or over 120 (normal HR is 60-100 beats per minute)  1) What is your heart rate?  His hr is 98 right now   2) Do you have a log of your heart rate readings (document readings)? No , patient said that his heart was over 100 all day yesterday and he thinks he is going back into afib again , does DR Harl Bowie want him to continue taking the medication he is on ?  3) Do you have any other symptoms? No

## 2020-10-19 NOTE — Telephone Encounter (Signed)
Spoke with pt and wife who states that pt may be in Afib. Blood pressure today is 103/64 with HR of 92. Pt denies SOB, chest pressure or pain at this time. States that he feels fine. Pt informed to continue medication until told to stop by provider. Pt will come in today for EKG. Please advise.

## 2020-10-19 NOTE — Telephone Encounter (Signed)
New message    Patient going to run errands please call his wife back and let her know if he should continue taking the medication that Dr Harl Bowie gave him for AFIB

## 2020-10-19 NOTE — Telephone Encounter (Signed)
Please send EKG when we have it, please send me a phone note when completed so I know its available   Zandra Abts MD

## 2020-10-19 NOTE — Telephone Encounter (Signed)
Returned call to pt wife. No answer. Left msg to call back.

## 2020-10-20 ENCOUNTER — Ambulatory Visit (HOSPITAL_COMMUNITY)
Admission: RE | Admit: 2020-10-20 | Discharge: 2020-10-20 | Disposition: A | Discharge status: Home / Self Care | Payer: Medicare Other | Source: Ambulatory Visit | Attending: Physician Assistant | Admitting: Physician Assistant

## 2020-10-20 ENCOUNTER — Encounter (HOSPITAL_COMMUNITY): Payer: Self-pay | Admitting: Physician Assistant

## 2020-10-20 VITALS — BP 140/84 | HR 76 | Ht 72.0 in | Wt 188.2 lb

## 2020-10-20 DIAGNOSIS — Z8249 Family history of ischemic heart disease and other diseases of the circulatory system: Secondary | ICD-10-CM | POA: Insufficient documentation

## 2020-10-20 DIAGNOSIS — Z79899 Other long term (current) drug therapy: Secondary | ICD-10-CM | POA: Diagnosis not present

## 2020-10-20 DIAGNOSIS — I4811 Longstanding persistent atrial fibrillation: Secondary | ICD-10-CM | POA: Diagnosis not present

## 2020-10-20 DIAGNOSIS — E785 Hyperlipidemia, unspecified: Secondary | ICD-10-CM | POA: Insufficient documentation

## 2020-10-20 DIAGNOSIS — Z87891 Personal history of nicotine dependence: Secondary | ICD-10-CM | POA: Diagnosis not present

## 2020-10-20 DIAGNOSIS — I2510 Atherosclerotic heart disease of native coronary artery without angina pectoris: Secondary | ICD-10-CM | POA: Diagnosis not present

## 2020-10-20 DIAGNOSIS — Z7901 Long term (current) use of anticoagulants: Secondary | ICD-10-CM | POA: Diagnosis not present

## 2020-10-20 DIAGNOSIS — D6869 Other thrombophilia: Secondary | ICD-10-CM | POA: Diagnosis not present

## 2020-10-20 LAB — CBC
HCT: 45.7 % (ref 39.0–52.0)
Hemoglobin: 15.3 g/dL (ref 13.0–17.0)
MCH: 31.8 pg (ref 26.0–34.0)
MCHC: 33.5 g/dL (ref 30.0–36.0)
MCV: 95 fL (ref 80.0–100.0)
Platelets: 229 10*3/uL (ref 150–400)
RBC: 4.81 MIL/uL (ref 4.22–5.81)
RDW: 12.6 % (ref 11.5–15.5)
WBC: 8.1 10*3/uL (ref 4.0–10.5)
nRBC: 0 % (ref 0.0–0.2)

## 2020-10-20 LAB — BASIC METABOLIC PANEL
Anion gap: 7 (ref 5–15)
BUN: 16 mg/dL (ref 8–23)
CO2: 27 mmol/L (ref 22–32)
Calcium: 9.2 mg/dL (ref 8.9–10.3)
Chloride: 101 mmol/L (ref 98–111)
Creatinine, Ser: 1.07 mg/dL (ref 0.61–1.24)
GFR, Estimated: >60 mL/min (ref >60)
Glucose, Bld: 95 mg/dL (ref 70–99)
Potassium: 4.9 mmol/L (ref 3.5–5.1)
Sodium: 135 mmol/L (ref 135–145)

## 2020-10-20 NOTE — Progress Notes (Signed)
Primary Care Physician: Asencion Noble, MD Primary Cardiologist: Dr Harl Bowie Primary Electrophysiologist: Dr Lovena Le Referring Physician: Stephanie Coup Sean Miranda is a 86 y.o. male with a history of CAD, carotid artery disease, HLD, and longstanding persistent atrial fibrillation who presents for follow up in the [facility]. The patient was initially diagnosed with atrial fibrillation 01/2017 after presenting with symptoms of chest perssure. Patient is on Eliquis for a CHADS2VASC score of 3. Due to continued dyspnea attempts were made to get him back into NSR, including multiple cardioversions and starting flecanide. Had tolerated lopressor at the time but stopped taking due to HRs in 40s or 50s. Flecainide was discontinued when 2/2 CAD. Last ECG with SR was 02/07/19. Patient reports that he has had symptoms of increased fatigue over the last couple months. He denies CP, SOB, dizziness, or palpitations. Patient is s/p dofetilide loading 10/25-10/28/21. He underwent DCCV on 09/28/20.   On follow up today, patient reports he was back in afib 10/18/20 when he noted an irregular beat on his BP machine. He did have some mild palpitations but otherwise was unaware of his arrhythmia. Patient had ECG at primary cardiologist office which showed rate controlled afib. There were no specific triggers that he could identify.   Today, he denies symptoms of chest pain, shortness of breath, orthopnea, PND, lower extremity edema, dizziness, presyncope, syncope, snoring, daytime somnolence, bleeding, or neurologic sequela. The patient is tolerating medications without difficulties and is otherwise without complaint today.    Atrial Fibrillation Risk Factors:  he does not have symptoms or diagnosis of sleep apnea. he does not have a history of rheumatic fever.   he has a BMI of Body mass index is 25.52 kg/m.Marland Kitchen Filed Weights   10/20/20 1133  Weight: 85.4 kg    Family  History  Problem Relation Age of Onset   Stroke Father    Hypertension Mother    Prostate cancer Son    Stroke Brother    Cancer Brother        spine     Atrial Fibrillation Management history:  Previous antiarrhythmic drugs: flecainide, dofetilide  Previous cardioversions: 2018, 09/28/20 Previous ablations: none CHADS2VASC score: 3 Anticoagulation history: Eliquis   Past Medical History:  Diagnosis Date   Arthritis    neck    Atrial fibrillation ([place])    a. new-onset in 01/2017. Started on Eliquis   CAD (coronary artery disease), native coronary artery 05/16/2017   LAD 95%>>0 w/ 2.5 x 16 mm Synergy DES, D2 40%, RCA 50%, EF 55-65%, LVEDP mod elevated   Calcification of coronary artery    a. mild, nonobstructive CAD by cath in 2009.   Cancer of skin of chest    Carotid artery occlusion    a. s/p R CEA in 2014   Colon polyps    Hyperlipidemia    takes Pravastatin every other day   Pneumonia ~ 01/2013   Past Surgical History:  Procedure Laterality Date   CARDIAC CATHETERIZATION  06/09/1999   mild CAD (LAD, diagonal, RCA) - Dr. Rockne Menghini   CARDIOVERSION N/A 03/25/2017   Procedure: CARDIOVERSION;  Surgeon: Pixie Casino, MD;  Location: [facility] ENDOSCOPY;  Service: Cardiovascular;  Laterality: N/A;   CARDIOVERSION N/A 09/28/2020   Procedure: CARDIOVERSION;  Surgeon: [name], MD;  Location: [facility] ENDOSCOPY;  Service: Cardiovascular;  Laterality: N/A;   CAROTID DOPPLER  02/2012   60-79% RICA stenosis, 19-41% LICA stenosis (prior to carotid endarterectomy)  COLONOSCOPY     CORONARY STENT INTERVENTION N/A 05/16/2017   Procedure: Coronary Stent Intervention;  Surgeon: Jettie Booze, MD;  Location: [place] CV LAB;  Service: Cardiovascular;  Laterality: N/A;   ENDARTERECTOMY Right 11/10/2013   Procedure: ENDARTERECTOMY CAROTID;  Surgeon: Conrad [place], MD;  Location: [place];  Service: Vascular;  Laterality: Right;   INGUINAL HERNIA REPAIR Left      LEFT HEART CATH AND CORONARY ANGIOGRAPHY N/A 05/16/2017   Procedure: Left Heart Cath and Coronary Angiography;  Surgeon: Jettie Booze, MD;  Location: [place] CV LAB;  Service: Cardiovascular;  Laterality: N/A;   MOLE REMOVAL  1990s   "chest"   NM MYOCAR PERF WALL MOTION  04/2011   bruce myoview - perfusion defect in inferior myocardium (diaphragmatic attenuation), remaining myocardium with normal perfusion, EF 67%   PATCH ANGIOPLASTY Right 11/10/2013   Procedure: PATCH ANGIOPLASTY;  Surgeon: Conrad Seaboard, MD;  Location: [place];  Service: Vascular;  Laterality: Right;   PILONIDAL CYST DRAINAGE     SHOULDER OPEN ROTATOR CUFF REPAIR Bilateral [place]  ~ 2015   "chest"   TONSILLECTOMY      Current Outpatient Medications  Medication Sig Dispense Refill   ALPHA LIPOIC ACID PO Take 300 mg by mouth daily.     Cholecalciferol (VITAMIN D3) 2000 UNITS capsule Take 2,000 Units by mouth daily.      Coenzyme Q10 (COQ10 PO) Take 2 capsules by mouth daily.     dofetilide (TIKOSYN) 250 MCG capsule Take 1 capsule (250 mcg total) by mouth 2 (two) times daily. 60 capsule 6   ELIQUIS 5 MG TABS tablet TAKE (1) TABLET BY MOUTH TWICE DAILY. 180 tablet 1   Glucosamine-Chondroitin 500-400 MG CAPS Take 1 capsule by mouth daily.      Magnesium 500 MG TABS Take 500 mg by mouth daily.      Multiple Vitamin (MULTIVITAMIN WITH MINERALS) TABS tablet Take 1 tablet by mouth daily.     nitroGLYCERIN (NITROSTAT) 0.4 MG SL tablet Place 1 tablet (0.4 mg total) under the tongue every 5 (five) minutes as needed for chest pain. 30 tablet 0   Omega-3 Fatty Acids (FISH OIL PO) Take 2 capsules by mouth daily.     pravastatin (PRAVACHOL) 40 MG tablet TAKE 1/2 TABLET BY MOUTH EVERY MONDAY, WEDNESDAY AND FRIDAY. 36 tablet 3   Probiotic Product (PROBIOTIC DAILY PO) Take 1 capsule by mouth daily.     sildenafil (REVATIO) 20 MG tablet TAKE 2 TO 3 TABLETS BY MOUTH AS NEEDED FOR  SEXUAL ACTIVITY 60 tablet 4   No current facility-administered medications for this encounter.    Allergies  Allergen Reactions   Lipitor [Atorvastatin Calcium] Other (See Comments)    Muscle pain   Vantin [Cefpodoxime] Rash    Social History   Socioeconomic History   Marital status: Married    Spouse name: Not on file   Number of children: 3   Years of education: Not on file   Highest education level: Not on file  Occupational History    Employer: [employer]  Tobacco Use   Smoking status: Former Smoker    Packs/day: 1.00    Years: 18.00    Pack years: 18.00    Types: Cigarettes    Quit date: 12/03/1972    Years since quitting: 47.9   Smokeless tobacco: Never Used  Vaping Use   Vaping Use: Never used  Substance and Sexual Activity  Alcohol use: Yes    Alcohol/week: 14.0 standard drinks    Types: 7 Glasses of wine, 7 Shots of liquor per week   Drug use: No   Sexual activity: Yes  Other Topics Concern   Not on file  Social History Narrative   Not on file   Social Determinants of Health   Financial Resource Strain:    Difficulty of Paying Living Expenses: Not on file  Food Insecurity:    Worried About [place] in the Last Year: Not on file   Ran Out of Food in the Last Year: Not on file  Transportation Needs:    Lack of Transportation (Medical): Not on file   Lack of Transportation (Non-Medical): Not on file  Physical Activity:    Days of Exercise per Week: Not on file   Minutes of Exercise per Session: Not on file  Stress:    Feeling of Stress : Not on file  Social Connections:    Frequency of Communication with Friends and Family: Not on file   Frequency of Social Gatherings with Friends and Family: Not on file   Attends Religious Services: Not on file   Active Member of Clubs or Organizations: Not on file   Attends Archivist Meetings: Not on file   Marital Status: Not on file  Intimate Partner  Violence:    Fear of Current or Ex-Partner: Not on file   Emotionally Abused: Not on file   Physically Abused: Not on file   Sexually Abused: Not on file     ROS- All systems are reviewed and negative except as per the HPI above.  Physical Exam: Vitals:   10/20/20 1133  BP: 140/84  Pulse: 76  Weight: 85.4 kg  Height: 6' (1.829 m)    GEN- The patient is well appearing elderly male, alert and oriented x 3 today.   HEENT-head normocephalic, atraumatic, sclera clear, conjunctiva pink, hearing intact, trachea midline. Lungs- Clear to ausculation bilaterally, normal work of breathing Heart- irregular rate and rhythm, no murmurs, rubs or gallops  GI- soft, NT, ND, + BS Extremities- no clubbing, cyanosis, or edema MS- no significant deformity or atrophy Skin- no rash or lesion Psych- euthymic mood, full affect Neuro- strength and sensation are intact   Wt Readings from Last 3 Encounters:  10/20/20 85.4 kg  10/05/20 86.1 kg  09/29/20 82.5 kg    EKG today demonstrates afib HR 76, QRS 92, QTc 414  Echo 05/15/17 demonstrated  - Left ventricle: The cavity size was normal. Wall thickness was  increased in a pattern of mild LVH. Systolic function was normal.  The estimated ejection fraction was in the range of 60% to 65%.  Wall motion was normal; there were no regional wall motion  abnormalities. The study is not technically sufficient to allow  evaluation of LV diastolic function.  - Aortic valve: Mildly calcified annulus. Trileaflet; moderately  thickened leaflets. Valve area (VTI): 2.98 cm^2. Valve area  (Vmax): 2.7 cm^2. Valve area (Vmean): 2.56 cm^2.  - Left atrium: The atrium was mildly dilated.  - Technically adequate study.   Epic records are reviewed at length today  CHA2DS2-VASc Score = 3  The patient's score is based upon: CHF History: 0 HTN History: 0 Diabetes History: 0 Stroke History: 0 Vascular Disease History: 1      ASSESSMENT AND  PLAN: 1. Longstanding Persistent Atrial Fibrillation (ICD10:  I48.11) The patient's CHA2DS2-VASc score is 3, indicating a 3.2% annual risk of stroke.  S/p dofetilide loading 10/25-10/28/21 with DCCV on 09/28/20 He is back in rate controlled afib. Will plan for DCCV. Check bmet/cbc today.  Continue dofetilide 250 mcg BID Continue Eliquis 5 mg BID Ultimately, rate control may be his best option given his age and paucity of symptoms.   2. Secondary Hypercoagulable State (ICD10:  D68.69) The patient is at significant risk for stroke/thromboembolism based upon his CHA2DS2-VASc Score of 3.  Continue Apixaban (Eliquis).   3. CAD No anginal symptoms.   Follow up with Dr Lovena Le and Dr Harl Bowie as scheduled.    [facility] [address] [place],  [address] [phone] 10/20/2020 11:38 AM

## 2020-10-20 NOTE — Telephone Encounter (Signed)
Patient has apt today in A-fib clinic

## 2020-10-20 NOTE — Telephone Encounter (Signed)
Patient had EKG, was in A-fib, messaged A-fib clinic to schedule an apt. Patient is aware.

## 2020-10-20 NOTE — H&P (View-Only) (Signed)
Primary Care Physician: Asencion Noble, MD Primary Cardiologist: Dr Harl Bowie Primary Electrophysiologist: Dr Lovena Le Referring Physician: Stephanie Coup Jauca is a 86 y.o. male with a history of CAD, carotid artery disease, HLD, and longstanding persistent atrial fibrillation who presents for follow up in the [facility]. The patient was initially diagnosed with atrial fibrillation 01/2017 after presenting with symptoms of chest perssure. Patient is on Eliquis for a CHADS2VASC score of 3. Due to continued dyspnea attempts were made to get him back into NSR, including multiple cardioversions and starting flecanide. Had tolerated lopressor at the time but stopped taking due to HRs in 40s or 50s. Flecainide was discontinued when 2/2 CAD. Last ECG with SR was 02/07/19. Patient reports that he has had symptoms of increased fatigue over the last couple months. He denies CP, SOB, dizziness, or palpitations. Patient is s/p dofetilide loading 10/25-10/28/21. He underwent DCCV on 09/28/20.   On follow up today, patient reports he was back in afib 10/18/20 when he noted an irregular beat on his BP machine. He did have some mild palpitations but otherwise was unaware of his arrhythmia. Patient had ECG at primary cardiologist office which showed rate controlled afib. There were no specific triggers that he could identify.   Today, he denies symptoms of chest pain, shortness of breath, orthopnea, PND, lower extremity edema, dizziness, presyncope, syncope, snoring, daytime somnolence, bleeding, or neurologic sequela. The patient is tolerating medications without difficulties and is otherwise without complaint today.    Atrial Fibrillation Risk Factors:  he does not have symptoms or diagnosis of sleep apnea. he does not have a history of rheumatic fever.   he has a BMI of Body mass index is 25.52 kg/m.Marland Kitchen Filed Weights   10/20/20 1133  Weight: 85.4 kg    Family  History  Problem Relation Age of Onset  . Stroke Father   . Hypertension Mother   . Prostate cancer Son   . Stroke Brother   . Cancer Brother        spine     Atrial Fibrillation Management history:  Previous antiarrhythmic drugs: flecainide, dofetilide  Previous cardioversions: 2018, 09/28/20 Previous ablations: none CHADS2VASC score: 3 Anticoagulation history: Eliquis   Past Medical History:  Diagnosis Date  . Arthritis    neck   . Atrial fibrillation ([place])    a. new-onset in 01/2017. Started on Eliquis  . CAD (coronary artery disease), native coronary artery 05/16/2017   LAD 95%>>0 w/ 2.5 x 16 mm Synergy DES, D2 40%, RCA 50%, EF 55-65%, LVEDP mod elevated  . Calcification of coronary artery    a. mild, nonobstructive CAD by cath in 2009.  Marland Kitchen Cancer of skin of chest   . Carotid artery occlusion    a. s/p R CEA in 2014  . Colon polyps   . Hyperlipidemia    takes Pravastatin every other day  . Pneumonia ~ 01/2013   Past Surgical History:  Procedure Laterality Date  . CARDIAC CATHETERIZATION  06/09/1999   mild CAD (LAD, diagonal, RCA) - Dr. Loni Muse. [place]  . CARDIOVERSION N/A 03/25/2017   Procedure: CARDIOVERSION;  Surgeon: Pixie Casino, MD;  Location: [facility] ENDOSCOPY;  Service: Cardiovascular;  Laterality: N/A;  . CARDIOVERSION N/A 09/28/2020   Procedure: CARDIOVERSION;  Surgeon: [name], MD;  Location: [facility] ENDOSCOPY;  Service: Cardiovascular;  Laterality: N/A;  . CAROTID DOPPLER  02/2012   60-79% RICA stenosis, 71-06% LICA stenosis (prior to carotid endarterectomy)  .  COLONOSCOPY    . CORONARY STENT INTERVENTION N/A 05/16/2017   Procedure: Coronary Stent Intervention;  Surgeon: Jettie Booze, MD;  Location: [place] CV LAB;  Service: Cardiovascular;  Laterality: N/A;  . ENDARTERECTOMY Right 11/10/2013   Procedure: ENDARTERECTOMY CAROTID;  Surgeon: Conrad [place], MD;  Location: [place];  Service: Vascular;  Laterality: Right;  . INGUINAL HERNIA REPAIR Left     . LEFT HEART CATH AND CORONARY ANGIOGRAPHY N/A 05/16/2017   Procedure: Left Heart Cath and Coronary Angiography;  Surgeon: Jettie Booze, MD;  Location: [place] CV LAB;  Service: Cardiovascular;  Laterality: N/A;  . MOLE REMOVAL  1990s   "chest"  . NM MYOCAR PERF WALL MOTION  04/2011   bruce myoview - perfusion defect in inferior myocardium (diaphragmatic attenuation), remaining myocardium with normal perfusion, EF 67%  . PATCH ANGIOPLASTY Right 11/10/2013   Procedure: PATCH ANGIOPLASTY;  Surgeon: Conrad Cannon, MD;  Location: [place];  Service: Vascular;  Laterality: Right;  . PILONIDAL CYST DRAINAGE    . SHOULDER OPEN ROTATOR CUFF REPAIR Bilateral 1998 - 2001  . SKIN CANCER EXCISION  ~ 2015   "chest"  . TONSILLECTOMY      Current Outpatient Medications  Medication Sig Dispense Refill  . ALPHA LIPOIC ACID PO Take 300 mg by mouth daily.    . Cholecalciferol (VITAMIN D3) 2000 UNITS capsule Take 2,000 Units by mouth daily.     . Coenzyme Q10 (COQ10 PO) Take 2 capsules by mouth daily.    Marland Kitchen dofetilide (TIKOSYN) 250 MCG capsule Take 1 capsule (250 mcg total) by mouth 2 (two) times daily. 60 capsule 6  . ELIQUIS 5 MG TABS tablet TAKE (1) TABLET BY MOUTH TWICE DAILY. 180 tablet 1  . Glucosamine-Chondroitin 500-400 MG CAPS Take 1 capsule by mouth daily.     . Magnesium 500 MG TABS Take 500 mg by mouth daily.     . Multiple Vitamin (MULTIVITAMIN WITH MINERALS) TABS tablet Take 1 tablet by mouth daily.    . nitroGLYCERIN (NITROSTAT) 0.4 MG SL tablet Place 1 tablet (0.4 mg total) under the tongue every 5 (five) minutes as needed for chest pain. 30 tablet 0  . Omega-3 Fatty Acids (FISH OIL PO) Take 2 capsules by mouth daily.    . pravastatin (PRAVACHOL) 40 MG tablet TAKE 1/2 TABLET BY MOUTH EVERY MONDAY, WEDNESDAY AND FRIDAY. 36 tablet 3  . Probiotic Product (PROBIOTIC DAILY PO) Take 1 capsule by mouth daily.    . sildenafil (REVATIO) 20 MG tablet TAKE 2 TO 3 TABLETS BY MOUTH AS NEEDED FOR  SEXUAL ACTIVITY 60 tablet 4   No current facility-administered medications for this encounter.    Allergies  Allergen Reactions  . Lipitor [Atorvastatin Calcium] Other (See Comments)    Muscle pain  . Vantin [Cefpodoxime] Rash    Social History   Socioeconomic History  . Marital status: Married    Spouse name: Not on file  . Number of children: 3  . Years of education: Not on file  . Highest education level: Not on file  Occupational History    Employer: [employer]  Tobacco Use  . Smoking status: Former Smoker    Packs/day: 1.00    Years: 18.00    Pack years: 18.00    Types: Cigarettes    Quit date: 12/03/1972    Years since quitting: 47.9  . Smokeless tobacco: Never Used  Vaping Use  . Vaping Use: Never used  Substance and Sexual Activity  .  Alcohol use: Yes    Alcohol/week: 14.0 standard drinks    Types: 7 Glasses of wine, 7 Shots of liquor per week  . Drug use: No  . Sexual activity: Yes  Other Topics Concern  . Not on file  Social History Narrative  . Not on file   Social Determinants of Health   Financial Resource Strain:   . Difficulty of Paying Living Expenses: Not on file  Food Insecurity:   . Worried About [place] in the Last Year: Not on file  . Ran Out of Food in the Last Year: Not on file  Transportation Needs:   . Lack of Transportation (Medical): Not on file  . Lack of Transportation (Non-Medical): Not on file  Physical Activity:   . Days of Exercise per Week: Not on file  . Minutes of Exercise per Session: Not on file  Stress:   . Feeling of Stress : Not on file  Social Connections:   . Frequency of Communication with Friends and Family: Not on file  . Frequency of Social Gatherings with Friends and Family: Not on file  . Attends Religious Services: Not on file  . Active Member of Clubs or Organizations: Not on file  . Attends Archivist Meetings: Not on file  . Marital Status: Not on file  Intimate Partner  Violence:   . Fear of Current or Ex-Partner: Not on file  . Emotionally Abused: Not on file  . Physically Abused: Not on file  . Sexually Abused: Not on file     ROS- All systems are reviewed and negative except as per the HPI above.  Physical Exam: Vitals:   10/20/20 1133  BP: 140/84  Pulse: 76  Weight: 85.4 kg  Height: 6' (1.829 m)    GEN- The patient is well appearing elderly male, alert and oriented x 3 today.   HEENT-head normocephalic, atraumatic, sclera clear, conjunctiva pink, hearing intact, trachea midline. Lungs- Clear to ausculation bilaterally, normal work of breathing Heart- irregular rate and rhythm, no murmurs, rubs or gallops  GI- soft, NT, ND, + BS Extremities- no clubbing, cyanosis, or edema MS- no significant deformity or atrophy Skin- no rash or lesion Psych- euthymic mood, full affect Neuro- strength and sensation are intact   Wt Readings from Last 3 Encounters:  10/20/20 85.4 kg  10/05/20 86.1 kg  09/29/20 82.5 kg    EKG today demonstrates afib HR 76, QRS 92, QTc 414  Echo 05/15/17 demonstrated  - Left ventricle: The cavity size was normal. Wall thickness was  increased in a pattern of mild LVH. Systolic function was normal.  The estimated ejection fraction was in the range of 60% to 65%.  Wall motion was normal; there were no regional wall motion  abnormalities. The study is not technically sufficient to allow  evaluation of LV diastolic function.  - Aortic valve: Mildly calcified annulus. Trileaflet; moderately  thickened leaflets. Valve area (VTI): 2.98 cm^2. Valve area  (Vmax): 2.7 cm^2. Valve area (Vmean): 2.56 cm^2.  - Left atrium: The atrium was mildly dilated.  - Technically adequate study.   Epic records are reviewed at length today  CHA2DS2-VASc Score = 3  The patient's score is based upon: CHF History: 0 HTN History: 0 Diabetes History: 0 Stroke History: 0 Vascular Disease History: 1      ASSESSMENT AND  PLAN: 1. Longstanding Persistent Atrial Fibrillation (ICD10:  I48.11) The patient's CHA2DS2-VASc score is 3, indicating a 3.2% annual risk of stroke.  S/p dofetilide loading 10/25-10/28/21 with DCCV on 09/28/20 He is back in rate controlled afib. Will plan for DCCV. Check bmet/cbc today.  Continue dofetilide 250 mcg BID Continue Eliquis 5 mg BID Ultimately, rate control may be his best option given his age and paucity of symptoms.   2. Secondary Hypercoagulable State (ICD10:  D68.69) The patient is at significant risk for stroke/thromboembolism based upon his CHA2DS2-VASc Score of 3.  Continue Apixaban (Eliquis).   3. CAD No anginal symptoms.   Follow up with Dr Lovena Le and Dr Harl Bowie as scheduled.    [facility] [address] [place], [place] [address] [phone] 10/20/2020 11:38 AM

## 2020-10-20 NOTE — Patient Instructions (Signed)
Cardioversion scheduled for Thursday, December 2nd  - Arrive at the [facility] and go to admitting at 830AM  - Do not eat or drink anything after midnight the night prior to your procedure.  - Take all your morning medication (except diabetic medications) with a sip of water prior to arrival.  - You will not be able to drive home after your procedure.  - Do NOT miss any doses of your blood thinner - if you should miss a dose please notify our office immediately.  - If you feel as if you go back into normal rhythm prior to scheduled cardioversion, please notify our office immediately. If your procedure is canceled in the cardioversion suite you will be charged a cancellation fee.

## 2020-11-01 ENCOUNTER — Other Ambulatory Visit: Payer: Self-pay

## 2020-11-01 ENCOUNTER — Other Ambulatory Visit (HOSPITAL_COMMUNITY)
Admission: RE | Admit: 2020-11-01 | Discharge: 2020-11-01 | Disposition: A | Discharge status: Home / Self Care | Payer: Medicare Other | Source: Ambulatory Visit | Attending: Cardiology | Admitting: Cardiology

## 2020-11-01 DIAGNOSIS — Z01812 Encounter for preprocedural laboratory examination: Secondary | ICD-10-CM | POA: Diagnosis not present

## 2020-11-01 DIAGNOSIS — Z20822 Contact with and (suspected) exposure to covid-19: Secondary | ICD-10-CM | POA: Diagnosis not present

## 2020-11-01 LAB — SARS CORONAVIRUS 2 (TAT 6-24 HRS): SARS Coronavirus 2: NEGATIVE

## 2020-11-03 ENCOUNTER — Ambulatory Visit (HOSPITAL_COMMUNITY): Payer: Medicare Other | Admitting: Certified Registered Nurse Anesthetist

## 2020-11-03 ENCOUNTER — Ambulatory Visit (HOSPITAL_COMMUNITY)
Admission: RE | Admit: 2020-11-03 | Discharge: 2020-11-03 | Disposition: A | Discharge status: Home / Self Care | Payer: Medicare Other | Attending: Cardiology | Admitting: Cardiology

## 2020-11-03 ENCOUNTER — Encounter (HOSPITAL_COMMUNITY): Payer: Self-pay | Admitting: Cardiology

## 2020-11-03 ENCOUNTER — Encounter (HOSPITAL_COMMUNITY)
Admission: RE | Disposition: A | Discharge status: Home / Self Care | Payer: Self-pay | Source: Home / Self Care | Attending: Cardiology

## 2020-11-03 DIAGNOSIS — Z538 Procedure and treatment not carried out for other reasons: Secondary | ICD-10-CM | POA: Insufficient documentation

## 2020-11-03 DIAGNOSIS — I4891 Unspecified atrial fibrillation: Secondary | ICD-10-CM | POA: Insufficient documentation

## 2020-11-03 SURGERY — CANCELLED PROCEDURE

## 2020-11-03 NOTE — Interval H&P Note (Signed)
History and Physical Interval Note:  11/03/2020 9:50 AM  Sean Miranda  has presented today for surgery, with the diagnosis of A-FIB.  The various methods of treatment have been discussed with the patient and family. After consideration of risks, benefits and other options for treatment, the patient has consented to  Procedure(s): CARDIOVERSION (N/A) as a surgical intervention.  The patient's history has been reviewed, patient examined, no change in status, stable for surgery.  I have reviewed the patient's chart and labs.  Questions were answered to the patient's satisfaction.     [name]

## 2020-11-03 NOTE — Progress Notes (Signed)
Pt presented to Endo for cardioversion in sinus rhythm.  Cardioversion canceled.  Dr. Meda Coffee spoke with patient.  Pt discharged home with familiy.  Vista Lawman, RN

## 2020-11-03 NOTE — CV Procedure (Signed)
Electrical cardioversion was cancelled, the patient was in sinus bradycardia upon presentation.   [name], MD 11/03/2020

## 2020-11-04 ENCOUNTER — Ambulatory Visit: Payer: Medicare Other | Admitting: Internal Medicine

## 2020-11-04 ENCOUNTER — Other Ambulatory Visit: Payer: Self-pay

## 2020-11-04 ENCOUNTER — Encounter: Payer: Self-pay | Admitting: Internal Medicine

## 2020-11-04 VITALS — BP 120/80 | HR 52 | Ht 72.0 in | Wt 188.6 lb

## 2020-11-04 DIAGNOSIS — I4891 Unspecified atrial fibrillation: Secondary | ICD-10-CM

## 2020-11-04 NOTE — Progress Notes (Signed)
HPI Mr. Rumble returns today for followup of his PAF. He is a pleasant 86 yo man who still works who has developed atrial fib. He has been started on dofetilide and mostly stays in NSR. He has no symptoms of atrial fib except that when he has a prolonged episode of atrial fib he gets tired. He checks his bp regularly and notes that he has HR's in the 90-100 range in atrial fib and in the 50s in NSR. He denies peripheral edema.  Allergies  Allergen Reactions  . Lipitor [Atorvastatin Calcium] Other (See Comments)    Muscle pain  . Vantin [Cefpodoxime] Rash     Current Outpatient Medications  Medication Sig Dispense Refill  . ALPHA LIPOIC ACID PO Take 250 mg by mouth daily.     Marland Kitchen b complex vitamins capsule Take 1 capsule by mouth every other day.    . Cholecalciferol (VITAMIN D3) 75 MCG (3000 UT) TABS Take 3,000 Units by mouth daily.     . Coenzyme Q10 (COQ10) 400 MG CAPS Take 800 mg by mouth in the morning and at bedtime.     Marland Kitchen DHEA 25 MG CAPS Take 25 mg by mouth daily.    Marland Kitchen dofetilide (TIKOSYN) 250 MCG capsule Take 1 capsule (250 mcg total) by mouth 2 (two) times daily. 60 capsule 6  . ELIQUIS 5 MG TABS tablet TAKE (1) TABLET BY MOUTH TWICE DAILY. (Patient taking differently: Take 5 mg by mouth 2 (two) times daily. ) 180 tablet 1  . Glucosamine-Chondroitin 500-400 MG CAPS Take 500 mg by mouth daily.     Marland Kitchen L-Arginine 1000 MG TABS Take 1,000 mg by mouth daily.    . Lecithin 1200 MG CAPS Take 1,200 mg by mouth daily. Triple    . Magnesium 500 MG TABS Take 500 mg by mouth daily.     . Multiple Vitamin (MULTIVITAMIN WITH MINERALS) TABS tablet Take 1 tablet by mouth daily.    . nitroGLYCERIN (NITROSTAT) 0.4 MG SL tablet Place 1 tablet (0.4 mg total) under the tongue every 5 (five) minutes as needed for chest pain. 30 tablet 0  . Omega 3 1200 MG CAPS Take 1,200 mg by mouth daily.    . Omega-3 Fatty Acids (FISH OIL PO) Take 2,500 capsules by mouth daily. 1250 mg plus    . pravastatin  (PRAVACHOL) 40 MG tablet TAKE 1/2 TABLET BY MOUTH EVERY MONDAY, WEDNESDAY AND FRIDAY. (Patient taking differently: Take 40 mg by mouth every Monday, Wednesday, and Friday. ) 36 tablet 3  . selenium 200 MCG TABS tablet Take 200 mcg by mouth daily. Plus Vitamin E    . sildenafil (REVATIO) 20 MG tablet TAKE 2 TO 3 TABLETS BY MOUTH AS NEEDED FOR SEXUAL ACTIVITY (Patient taking differently: Take 60-80 mg by mouth daily as needed (ed). ) 60 tablet 4  . zinc gluconate 50 MG tablet Take 50 mg by mouth daily.     No current facility-administered medications for this visit.     Past Medical History:  Diagnosis Date  . Arthritis    neck   . Atrial fibrillation ([place])    a. new-onset in 01/2017. Started on Eliquis  . CAD (coronary artery disease), native coronary artery 05/16/2017   LAD 95%>>0 w/ 2.5 x 16 mm Synergy DES, D2 40%, RCA 50%, EF 55-65%, LVEDP mod elevated  . Calcification of coronary artery    a. mild, nonobstructive CAD by cath in 2009.  Marland Kitchen Cancer of skin of chest   .  Carotid artery occlusion    a. s/p R CEA in 2014  . Colon polyps   . Hyperlipidemia    takes Pravastatin every other day  . Pneumonia ~ 01/2013    ROS:   All systems reviewed and negative except as noted in the HPI.   Past Surgical History:  Procedure Laterality Date  . CARDIAC CATHETERIZATION  06/09/1999   mild CAD (LAD, diagonal, RCA) - Dr. Loni Muse. [place]  . CARDIOVERSION N/A 03/25/2017   Procedure: CARDIOVERSION;  Surgeon: Pixie Casino, MD;  Location: [facility] ENDOSCOPY;  Service: Cardiovascular;  Laterality: N/A;  . CARDIOVERSION N/A 09/28/2020   Procedure: CARDIOVERSION;  Surgeon: [name], MD;  Location: [facility] ENDOSCOPY;  Service: Cardiovascular;  Laterality: N/A;  . CAROTID DOPPLER  02/2012   60-79% RICA stenosis, 93-79% LICA stenosis (prior to carotid endarterectomy)  . COLONOSCOPY    . CORONARY STENT INTERVENTION N/A 05/16/2017   Procedure: Coronary Stent Intervention;  Surgeon: Jettie Booze, MD;   Location: [place] CV LAB;  Service: Cardiovascular;  Laterality: N/A;  . ENDARTERECTOMY Right 11/10/2013   Procedure: ENDARTERECTOMY CAROTID;  Surgeon: Conrad Cazenovia, MD;  Location: [place];  Service: Vascular;  Laterality: Right;  . INGUINAL HERNIA REPAIR Left   . LEFT HEART CATH AND CORONARY ANGIOGRAPHY N/A 05/16/2017   Procedure: Left Heart Cath and Coronary Angiography;  Surgeon: Jettie Booze, MD;  Location: [place] CV LAB;  Service: Cardiovascular;  Laterality: N/A;  . MOLE REMOVAL  1990s   "chest"  . NM MYOCAR PERF WALL MOTION  04/2011   bruce myoview - perfusion defect in inferior myocardium (diaphragmatic attenuation), remaining myocardium with normal perfusion, EF 67%  . PATCH ANGIOPLASTY Right 11/10/2013   Procedure: PATCH ANGIOPLASTY;  Surgeon: Conrad Benedict, MD;  Location: [place];  Service: Vascular;  Laterality: Right;  . PILONIDAL CYST DRAINAGE    . SHOULDER OPEN ROTATOR CUFF REPAIR Bilateral 1998 - 2001  . SKIN CANCER EXCISION  ~ 2015   "chest"  . TONSILLECTOMY       Family History  Problem Relation Age of Onset  . Stroke Father   . Hypertension Mother   . Prostate cancer Son   . Stroke Brother   . Cancer Brother        spine     Social History   Socioeconomic History  . Marital status: Married    Spouse name: Not on file  . Number of children: 3  . Years of education: Not on file  . Highest education level: Not on file  Occupational History    Employer: [employer]  Tobacco Use  . Smoking status: Former Smoker    Packs/day: 1.00    Years: 18.00    Pack years: 18.00    Types: Cigarettes    Quit date: 12/03/1972    Years since quitting: 47.9  . Smokeless tobacco: Never Used  Vaping Use  . Vaping Use: Never used  Substance and Sexual Activity  . Alcohol use: Yes    Alcohol/week: 14.0 standard drinks    Types: 7 Glasses of wine, 7 Shots of liquor per week  . Drug use: No  . Sexual activity: Yes  Other Topics Concern  . Not on file  Social  History Narrative  . Not on file   Social Determinants of Health   Financial Resource Strain:   . Difficulty of Paying Living Expenses: Not on file  Food Insecurity:   . Worried About Charity fundraiser in  the Last Year: Not on file  . Ran Out of Food in the Last Year: Not on file  Transportation Needs:   . Lack of Transportation (Medical): Not on file  . Lack of Transportation (Non-Medical): Not on file  Physical Activity:   . Days of Exercise per Week: Not on file  . Minutes of Exercise per Session: Not on file  Stress:   . Feeling of Stress : Not on file  Social Connections:   . Frequency of Communication with Friends and Family: Not on file  . Frequency of Social Gatherings with Friends and Family: Not on file  . Attends Religious Services: Not on file  . Active Member of Clubs or Organizations: Not on file  . Attends Archivist Meetings: Not on file  . Marital Status: Not on file  Intimate Partner Violence:   . Fear of Current or Ex-Partner: Not on file  . Emotionally Abused: Not on file  . Physically Abused: Not on file  . Sexually Abused: Not on file     BP 120/80   Pulse (!) 52   Ht 6' (1.829 m)   Wt 188 lb 9.6 oz (85.5 kg)   SpO2 100%   BMI 25.58 kg/m   Physical Exam:  Well appearing NAD HEENT: Unremarkable Neck:  No JVD, no thyromegally Lymphatics:  No adenopathy Back:  No CVA tenderness Lungs:  Clear with no wheezes HEART:  Regular rate rhythm, no murmurs, no rubs, no clicks Abd:  soft, positive bowel sounds, no organomegally, no rebound, no guarding Ext:  2 plus pulses, no edema, no cyanosis, no clubbing Skin:  No rashes no nodules Neuro:  CN II through XII intact, motor grossly intact  EKG - reviewed from 11/2 and sinus brady    Assess/Plan: 1. PAF - he will continue dofetilide. I discussed that he may go in and out of rhythm and I recommend he continue the dofetilide. If his atrial fib begins to be more persistent we could consider  other treatment options, particularly rate control. 2. Coags - he will continue his Eliquis. 3. CAD - he denies anginal symptoms.  4. Dyslipidemia - he will continue pravachol.  Carleene Overlie Kirtis Challis,MD

## 2020-11-04 NOTE — Patient Instructions (Signed)
Medication Instructions:  Your physician recommends that you continue on your current medications as directed. Please refer to the Current Medication list given to you today.  *If you need a refill on your cardiac medications before your next appointment, please call your pharmacy*   Lab Work: NONE   If you have labs (blood work) drawn today and your tests are completely normal, you will receive your results only by: MyChart Message (if you have MyChart) OR A paper copy in the mail If you have any lab test that is abnormal or we need to change your treatment, we will call you to review the results.   Testing/Procedures: NONE    Follow-Up: At [facility], you and your health needs are our priority.  As part of our continuing mission to provide you with exceptional heart care, we have created designated Provider Care Teams.  These Care Teams include your primary Cardiologist (physician) and Advanced Practice Providers (APPs -  Physician Assistants and Nurse Practitioners) who all work together to provide you with the care you need, when you need it.  We recommend signing up for the patient portal called "MyChart".  Sign up information is provided on this After Visit Summary.  MyChart is used to connect with patients for Virtual Visits (Telemedicine).  Patients are able to view lab/test results, encounter notes, upcoming appointments, etc.  Non-urgent messages can be sent to your provider as well.   To learn more about what you can do with MyChart, go to [email or web address].    Your next appointment:   6 month(s)  The format for your next appointment:   In Person  Provider:   Gregg Taylor, MD   Other Instructions Thank you for choosing [facility]!    

## 2020-11-22 DIAGNOSIS — L853 Xerosis cutis: Secondary | ICD-10-CM | POA: Diagnosis not present

## 2020-12-06 DIAGNOSIS — M25512 Pain in left shoulder: Secondary | ICD-10-CM | POA: Diagnosis not present

## 2020-12-26 ENCOUNTER — Encounter: Payer: Self-pay | Admitting: Cardiology

## 2020-12-26 ENCOUNTER — Other Ambulatory Visit: Payer: Self-pay

## 2020-12-26 ENCOUNTER — Ambulatory Visit: Payer: Medicare Other | Admitting: Cardiology

## 2020-12-26 VITALS — BP 136/80 | HR 66 | Ht 72.0 in | Wt 192.0 lb

## 2020-12-26 DIAGNOSIS — I4891 Unspecified atrial fibrillation: Secondary | ICD-10-CM

## 2020-12-26 DIAGNOSIS — I2510 Atherosclerotic heart disease of native coronary artery without angina pectoris: Secondary | ICD-10-CM

## 2020-12-26 DIAGNOSIS — I480 Paroxysmal atrial fibrillation: Secondary | ICD-10-CM | POA: Diagnosis not present

## 2020-12-26 DIAGNOSIS — R413 Other amnesia: Secondary | ICD-10-CM | POA: Diagnosis not present

## 2020-12-26 DIAGNOSIS — E782 Mixed hyperlipidemia: Secondary | ICD-10-CM

## 2020-12-26 NOTE — Patient Instructions (Signed)
Medication Instructions:  Your physician recommends that you continue on your current medications as directed. Please refer to the Current Medication list given to you today.  *If you need a refill on your cardiac medications before your next appointment, please call your pharmacy*   Lab Work: None today If you have labs (blood work) drawn today and your tests are completely normal, you will receive your results only by: . MyChart Message (if you have MyChart) OR . A paper copy in the mail If you have any lab test that is abnormal or we need to change your treatment, we will call you to review the results.   Testing/Procedures: None today   Follow-Up: At [facility], you and your health needs are our priority.  As part of our continuing mission to provide you with exceptional heart care, we have created designated Provider Care Teams.  These Care Teams include your primary Cardiologist (physician) and Advanced Practice Providers (APPs -  Physician Assistants and Nurse Practitioners) who all work together to provide you with the care you need, when you need it.  We recommend signing up for the patient portal called "MyChart".  Sign up information is provided on this After Visit Summary.  MyChart is used to connect with patients for Virtual Visits (Telemedicine).  Patients are able to view lab/test results, encounter notes, upcoming appointments, etc.  Non-urgent messages can be sent to your provider as well.   To learn more about what you can do with MyChart, go to [email or web address].    Your next appointment:   6 month(s)  The format for your next appointment:   In Person  Provider:   Jonathan Branch, MD   Other Instructions None       Thank you for choosing [facility] !         

## 2020-12-26 NOTE — Progress Notes (Signed)
Clinical Summary Sean Miranda is a 87 y.o.male seen today for follow up of the following medical problems.   1. Afib - appears diagnosed around 01/2017 during admission. Started on eliquis and lopressor 12.5mg  bid. Due to continued dyspnea attempts were made to get him back into NSR, including multiple cardioversions and starting flecanide. Had tolerated lopressor at the time but stopped taking due to HRs in 40s or 50s, had been on 12.5mg  bid at the time.  - was on flecanide previously, discontued after diagnosed with CAD We tried lopressor 12.5mg  bid last visit but caused fatigue and he stopped taking. Heart rates checked multiple times a day, usually 50s-60s.  - - no recent palpitations - no bleeding on eliquis.   - seen in ER with palpitations and elevated HR to 90s - EKG showed rate controlled afib  HRs at home variable, 80s to 100s. Higher than his normal of 60s at rest.  - some fluttering at times. Some fatigue.   - followed by afib clinic/EP - started on dofetilide 09/2020 with DCCV at that time - recurrent afib noted at 10/2020 f/u. Repeat DCCV was planned 11/2020 but cancelled as pt was back in SR. - at last EP appt 12/3 plan was to continue dofetilide  - no recent afib symptoms - compliant with meds. No bleeding on eliquis.     2. CAD - admit 05/2017 with chest pain - 05/2017 cath as reported below, received a DES to LAD - 05/2017 echo LVEF 60-65%, no WMAs  - seen in ER 10/2019 with chest pain - trop neg x 1. EKG afib without acute ischemic changes. CXR no acute process  10/2019 nuclear stress no ischemia - no significant symptoms.     3. Carotid stenosis - history of right CEA - 01/2018 carotid US RICA 1-39% and patent CEA, left 1-39%. Annual checks  - followed by vascular.Has appt next month with repeat US  4. Hyperlipidemia - intolerant to statin, has been on low dose pravastatin.  - Dr Debara Pickett had discussed repatha, however patient  declinded due to concern about too low cholesterol - 05/2018 TC 195 HDL 39 TG 73 LDL 141  - taking pravastatin 40mg  MWF - has been resistant to repatha.  10/2019 TC 243 HDL 44 LDL 179. He was off statin at that time but has since restarted - most recent labs with pcp     Has had covid vaccine x3.    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 2 sons and daughter are involved in family business     Past Medical History:  Diagnosis Date  . Arthritis    neck   . Atrial fibrillation ([place])    a. new-onset in 01/2017. Started on Eliquis  . CAD (coronary artery disease), native coronary artery 05/16/2017   LAD 95%>>0 w/ 2.5 x 16 mm Synergy DES, D2 40%, RCA 50%, EF 55-65%, LVEDP mod elevated  . Calcification of coronary artery    a. mild, nonobstructive CAD by cath in 2009.  Marland Kitchen Cancer of skin of chest   . Carotid artery occlusion    a. s/p R CEA in 2014  . Colon polyps   . Hyperlipidemia    takes Pravastatin every other day  . Pneumonia ~ 01/2013     Allergies  Allergen Reactions  . Lipitor [Atorvastatin Calcium] Other (See Comments)  Muscle pain  . Vantin [Cefpodoxime] Rash     Current Outpatient Medications  Medication Sig Dispense Refill  . ALPHA LIPOIC ACID PO Take 250 mg by mouth daily.     Marland Kitchen b complex vitamins capsule Take 1 capsule by mouth every other day.    . Cholecalciferol (VITAMIN D3) 75 MCG (3000 UT) TABS Take 3,000 Units by mouth daily.     . Coenzyme Q10 (COQ10) 400 MG CAPS Take 800 mg by mouth in the morning and at bedtime.     Marland Kitchen DHEA 25 MG CAPS Take 25 mg by mouth daily.    Marland Kitchen dofetilide (TIKOSYN) 250 MCG capsule Take 1 capsule (250 mcg total) by mouth 2 (two) times daily. 60 capsule 6  . ELIQUIS 5 MG TABS tablet TAKE (1) TABLET BY MOUTH TWICE DAILY. (Patient taking differently: Take 5 mg by mouth 2 (two) times daily. ) 180  tablet 1  . Glucosamine-Chondroitin 500-400 MG CAPS Take 500 mg by mouth daily.     Marland Kitchen L-Arginine 1000 MG TABS Take 1,000 mg by mouth daily.    . Lecithin 1200 MG CAPS Take 1,200 mg by mouth daily. Triple    . Magnesium 500 MG TABS Take 500 mg by mouth daily.     . Multiple Vitamin (MULTIVITAMIN WITH MINERALS) TABS tablet Take 1 tablet by mouth daily.    . nitroGLYCERIN (NITROSTAT) 0.4 MG SL tablet Place 1 tablet (0.4 mg total) under the tongue every 5 (five) minutes as needed for chest pain. 30 tablet 0  . Omega 3 1200 MG CAPS Take 1,200 mg by mouth daily.    . Omega-3 Fatty Acids (FISH OIL PO) Take 2,500 capsules by mouth daily. 1250 mg plus    . pravastatin (PRAVACHOL) 40 MG tablet TAKE 1/2 TABLET BY MOUTH EVERY MONDAY, WEDNESDAY AND FRIDAY. (Patient taking differently: Take 40 mg by mouth every Monday, Wednesday, and Friday. ) 36 tablet 3  . selenium 200 MCG TABS tablet Take 200 mcg by mouth daily. Plus Vitamin E    . sildenafil (REVATIO) 20 MG tablet TAKE 2 TO 3 TABLETS BY MOUTH AS NEEDED FOR SEXUAL ACTIVITY (Patient taking differently: Take 60-80 mg by mouth daily as needed (ed). ) 60 tablet 4  . zinc gluconate 50 MG tablet Take 50 mg by mouth daily.     No current facility-administered medications for this visit.     Past Surgical History:  Procedure Laterality Date  . CARDIAC CATHETERIZATION  06/09/1999   mild CAD (LAD, diagonal, RCA) - Dr. Loni Muse. [place]  . CARDIOVERSION N/A 03/25/2017   Procedure: CARDIOVERSION;  Surgeon: Pixie Casino, MD;  Location: [facility] ENDOSCOPY;  Service: Cardiovascular;  Laterality: N/A;  . CARDIOVERSION N/A 09/28/2020   Procedure: CARDIOVERSION;  Surgeon: [name], MD;  Location: [facility] ENDOSCOPY;  Service: Cardiovascular;  Laterality: N/A;  . CAROTID DOPPLER  02/2012   60-79% RICA stenosis, 03-50% LICA stenosis (prior to carotid endarterectomy)  . COLONOSCOPY    . CORONARY STENT INTERVENTION N/A 05/16/2017   Procedure: Coronary Stent Intervention;  Surgeon:  Jettie Booze, MD;  Location: [place] CV LAB;  Service: Cardiovascular;  Laterality: N/A;  . ENDARTERECTOMY Right 11/10/2013   Procedure: ENDARTERECTOMY CAROTID;  Surgeon: Conrad Woodland, MD;  Location: [place];  Service: Vascular;  Laterality: Right;  . INGUINAL HERNIA REPAIR Left   . LEFT HEART CATH AND CORONARY ANGIOGRAPHY N/A 05/16/2017   Procedure: Left Heart Cath and Coronary Angiography;  Surgeon: Jettie Booze, MD;  Location:  [place] INVASIVE CV LAB;  Service: Cardiovascular;  Laterality: N/A;  . MOLE REMOVAL  1990s   "chest"  . NM MYOCAR PERF WALL MOTION  04/2011   bruce myoview - perfusion defect in inferior myocardium (diaphragmatic attenuation), remaining myocardium with normal perfusion, EF 67%  . PATCH ANGIOPLASTY Right 11/10/2013   Procedure: PATCH ANGIOPLASTY;  Surgeon: Conrad Wynot, MD;  Location: [place];  Service: Vascular;  Laterality: Right;  . PILONIDAL CYST DRAINAGE    . SHOULDER OPEN ROTATOR CUFF REPAIR Bilateral 1998 - 2001  . SKIN CANCER EXCISION  ~ 2015   "chest"  . TONSILLECTOMY       Allergies  Allergen Reactions  . Lipitor [Atorvastatin Calcium] Other (See Comments)    Muscle pain  . Vantin [Cefpodoxime] Rash      Family History  Problem Relation Age of Onset  . Stroke Father   . Hypertension Mother   . Prostate cancer Son   . Stroke Brother   . Cancer Brother        spine     Social History Mr. Perleberg reports that he quit smoking about 48 years ago. His smoking use included cigarettes. He has a 18.00 pack-year smoking history. He has never used smokeless tobacco. Mr. Skidmore reports current alcohol use of about 14.0 standard drinks of alcohol per week.   Review of Systems CONSTITUTIONAL: No weight loss, fever, chills, weakness or fatigue.  HEENT: Eyes: No visual loss, blurred vision, double vision or yellow sclerae.No hearing loss, sneezing, congestion, runny nose or sore throat.  SKIN: No rash or itching.  CARDIOVASCULAR: per  hpi RESPIRATORY: No shortness of breath, cough or sputum.  GASTROINTESTINAL: No anorexia, nausea, vomiting or diarrhea. No abdominal pain or blood.  GENITOURINARY: No burning on urination, no polyuria NEUROLOGICAL: No headache, dizziness, syncope, paralysis, ataxia, numbness or tingling in the extremities. No change in bowel or bladder control.  MUSCULOSKELETAL: No muscle, back pain, joint pain or stiffness.  LYMPHATICS: No enlarged nodes. No history of splenectomy.  PSYCHIATRIC: No history of depression or anxiety.  ENDOCRINOLOGIC: No reports of sweating, cold or heat intolerance. No polyuria or polydipsia.  Marland Kitchen   Physical Examination Today's Vitals   12/26/20 1408  BP: 136/80  Pulse: 66  SpO2: 98%  Weight: 192 lb (87.1 kg)  Height: 6' (1.829 m)   Body mass index is 26.04 kg/m.  Gen: resting comfortably, no acute distress HEENT: no scleral icterus, pupils equal round and reactive, no palptable cervical adenopathy,  CV: RRR, no m/r/g, no jvd Resp: Clear to auscultation bilaterally GI: abdomen is soft, non-tender, non-distended, normal bowel sounds, no hepatosplenomegaly MSK: extremities are warm, no edema.  Skin: warm, no rash Neuro:  no focal deficits Psych: appropriate affect   Diagnostic Studies 05/2017 cath   Mid RCA lesion, 50 %stenosed.  Ost RCA lesion, 25 %stenosed. Lesion noted in small RV marginal.  LM lesion, 25 %stenosed.  2nd Diag lesion, 40 %stenosed.  Mid LAD-1 lesion, 20 %stenosed.  Mid LAD-2 lesion, 95 %stenosed.  A STENT SYNERGY DES 2.5X16 drug eluting stent was successfully placed, postdilated to 3.0 mm.  Post intervention, there is a 0% residual stenosis.  The left ventricular systolic function is normal.  LV end diastolic pressure is moderately elevated.  The left ventricular ejection fraction is 55-65% by visual estimate.  There is no aortic valve stenosis.  LAD was the culprit for his unstable angina. He had symptoms during the  procedure at rest. These resolved after stent placement. Watch  overnight. Continue IV Angiomax for another hour.  Loaded with Plavix today. We'll start 75 mg daily tomorrow. Given aspirin today as well. Would plan for aspirin, Plavix and Eliquis for 30 days. After 30 days, would stop the aspirin and continue Plavix and Eliquis. A synergy drug-eluting stent was used since he is on long-term anticoagulation. If there is a bleeding complication, his antiplatelet therapy could be stopped sooner than usual. Ideally, he would get 6-12 months of Plavix depending on his bleeding risk profile.  Possible discharge tomorrow if he does well tonight and with cardiac rehabilitation tomorrow.   05/2017 echo Study Conclusions  - Left ventricle: The cavity size was normal. Wall thickness was increased in a pattern of mild LVH. Systolic function was normal. The estimated ejection fraction was in the range of 60% to 65%. Wall motion was normal; there were no regional wall motion abnormalities. The study is not technically sufficient to allow evaluation of LV diastolic function. - Aortic valve: Mildly calcified annulus. Trileaflet; moderately thickened leaflets. Valve area (VTI): 2.98 cm^2. Valve area (Vmax): 2.7 cm^2. Valve area (Vmean): 2.56 cm^2. - Left atrium: The atrium was mildly dilated. - Technically adequate study.   10/2019 nuclear stress  Unable to achieve adequate heart rate response with GXT, Lexiscan utilized. No diagnostic ST segment changes.  Small, mild intensity, inferior/inferoseptal defect extending from apex to base that is fixed (actually more prominent at rest) and consistent with soft tissue attenuation. No reversible defects to indicate ischemia.  This is a low risk study.  Nuclear stress EF: 69%.     Assessment and Plan  1. Afib - doing well on dofetilide, continue. - continue anticoag  2. CAD -no significant symptoms, relatively recent stress test  was benign.  - continue to monitor  3. Hyperlipidemia - limited in statin tolerance - request labs from pcp  Arnoldo Lenis, M.D.

## 2020-12-27 ENCOUNTER — Telehealth: Payer: Self-pay | Admitting: Cardiology

## 2020-12-27 NOTE — Telephone Encounter (Signed)
Error

## 2021-01-05 DIAGNOSIS — Z08 Encounter for follow-up examination after completed treatment for malignant neoplasm: Secondary | ICD-10-CM | POA: Diagnosis not present

## 2021-01-05 DIAGNOSIS — C499 Malignant neoplasm of connective and soft tissue, unspecified: Secondary | ICD-10-CM | POA: Diagnosis not present

## 2021-01-05 DIAGNOSIS — C493 Malignant neoplasm of connective and soft tissue of thorax: Secondary | ICD-10-CM | POA: Diagnosis not present

## 2021-01-05 DIAGNOSIS — Z85831 Personal history of malignant neoplasm of soft tissue: Secondary | ICD-10-CM | POA: Diagnosis not present

## 2021-01-13 ENCOUNTER — Other Ambulatory Visit: Payer: Self-pay

## 2021-01-13 DIAGNOSIS — I6523 Occlusion and stenosis of bilateral carotid arteries: Secondary | ICD-10-CM

## 2021-01-19 ENCOUNTER — Other Ambulatory Visit: Payer: Self-pay | Admitting: Internal Medicine

## 2021-01-30 ENCOUNTER — Other Ambulatory Visit: Payer: Self-pay

## 2021-01-30 ENCOUNTER — Ambulatory Visit (HOSPITAL_COMMUNITY)
Admission: RE | Admit: 2021-01-30 | Discharge: 2021-01-30 | Disposition: A | Discharge status: Home / Self Care | Payer: Medicare Other | Source: Ambulatory Visit | Attending: Surgery | Admitting: Surgery

## 2021-01-30 ENCOUNTER — Ambulatory Visit: Payer: Medicare Other | Admitting: Physician Assistant

## 2021-01-30 ENCOUNTER — Ambulatory Visit: Payer: Medicare Other

## 2021-01-30 ENCOUNTER — Encounter (HOSPITAL_COMMUNITY): Payer: Medicare Other

## 2021-01-30 VITALS — BP 136/59 | HR 57 | Temp 98.6°F | Resp 20 | Ht 72.0 in | Wt 192.7 lb

## 2021-01-30 DIAGNOSIS — I6523 Occlusion and stenosis of bilateral carotid arteries: Secondary | ICD-10-CM | POA: Diagnosis not present

## 2021-01-30 NOTE — Progress Notes (Signed)
History of Present Illness:  Patient is a 87 y.o. year old male who presents for evaluation of carotid stenosis.  right carotid endarterectomy by Dr. Bridgett Larsson in 2014.   The patient denies symptoms of TIA, amaurosis, or stroke.  He takes a Statin and he is on Eliquis for Afib.  He stays busy on a daily basis.     Past Medical History:  Diagnosis Date  . Arthritis    neck   . Atrial fibrillation ([place])    a. new-onset in 01/2017. Started on Eliquis  . CAD (coronary artery disease), native coronary artery 05/16/2017   LAD 95%>>0 w/ 2.5 x 16 mm Synergy DES, D2 40%, RCA 50%, EF 55-65%, LVEDP mod elevated  . Calcification of coronary artery    a. mild, nonobstructive CAD by cath in 2009.  Marland Kitchen Cancer of skin of chest   . Carotid artery occlusion    a. s/p R CEA in 2014  . Colon polyps   . Hyperlipidemia    takes Pravastatin every other day  . Pneumonia ~ 01/2013    Past Surgical History:  Procedure Laterality Date  . CARDIAC CATHETERIZATION  06/09/1999   mild CAD (LAD, diagonal, RCA) - Dr. Loni Muse. [place]  . CARDIOVERSION N/A 03/25/2017   Procedure: CARDIOVERSION;  Surgeon: Pixie Casino, MD;  Location: [facility] ENDOSCOPY;  Service: Cardiovascular;  Laterality: N/A;  . CARDIOVERSION N/A 09/28/2020   Procedure: CARDIOVERSION;  Surgeon: [name], MD;  Location: [facility] ENDOSCOPY;  Service: Cardiovascular;  Laterality: N/A;  . CAROTID DOPPLER  02/2012   60-79% RICA stenosis, 10-93% LICA stenosis (prior to carotid endarterectomy)  . COLONOSCOPY    . CORONARY STENT INTERVENTION N/A 05/16/2017   Procedure: Coronary Stent Intervention;  Surgeon: Jettie Booze, MD;  Location: [place] CV LAB;  Service: Cardiovascular;  Laterality: N/A;  . ENDARTERECTOMY Right 11/10/2013   Procedure: ENDARTERECTOMY CAROTID;  Surgeon: Conrad Venango, MD;  Location: [place];  Service: Vascular;  Laterality: Right;  . INGUINAL HERNIA REPAIR Left   . LEFT HEART CATH AND CORONARY ANGIOGRAPHY N/A 05/16/2017    Procedure: Left Heart Cath and Coronary Angiography;  Surgeon: Jettie Booze, MD;  Location: [place] CV LAB;  Service: Cardiovascular;  Laterality: N/A;  . MOLE REMOVAL  1990s   "chest"  . NM MYOCAR PERF WALL MOTION  04/2011   bruce myoview - perfusion defect in inferior myocardium (diaphragmatic attenuation), remaining myocardium with normal perfusion, EF 67%  . PATCH ANGIOPLASTY Right 11/10/2013   Procedure: PATCH ANGIOPLASTY;  Surgeon: Conrad Emmett, MD;  Location: [place];  Service: Vascular;  Laterality: Right;  . PILONIDAL CYST DRAINAGE    . SHOULDER OPEN ROTATOR CUFF REPAIR Bilateral 1998 - 2001  . SKIN CANCER EXCISION  ~ 2015   "chest"  . TONSILLECTOMY       Social History Social History   Tobacco Use  . Smoking status: Former Smoker    Packs/day: 1.00    Years: 18.00    Pack years: 18.00    Types: Cigarettes    Quit date: 12/03/1972    Years since quitting: 48.1  . Smokeless tobacco: Never Used  Vaping Use  . Vaping Use: Never used  Substance Use Topics  . Alcohol use: Yes    Alcohol/week: 14.0 standard drinks    Types: 7 Glasses of wine, 7 Shots of liquor per week  . Drug use: No    Family History Family History  Problem Relation Age of  Onset  . Stroke Father   . Hypertension Mother   . Prostate cancer Son   . Stroke Brother   . Cancer Brother        spine    Allergies  Allergies  Allergen Reactions  . Lipitor [Atorvastatin Calcium] Other (See Comments)    Muscle pain  . Vantin [Cefpodoxime] Rash     Current Outpatient Medications  Medication Sig Dispense Refill  . ALPHA LIPOIC ACID PO Take 250 mg by mouth daily.     Marland Kitchen b complex vitamins capsule Take 1 capsule by mouth every other day.    . Cholecalciferol (VITAMIN D3) 75 MCG (3000 UT) TABS Take 3,000 Units by mouth daily.     . Coenzyme Q10 (COQ10) 400 MG CAPS Take 800 mg by mouth in the morning and at bedtime.     Marland Kitchen DHEA 25 MG CAPS Take 25 mg by mouth daily.    Marland Kitchen dofetilide (TIKOSYN) 250  MCG capsule Take 1 capsule (250 mcg total) by mouth 2 (two) times daily. 60 capsule 6  . ELIQUIS 5 MG TABS tablet TAKE (1) TABLET BY MOUTH TWICE DAILY. 180 tablet 1  . Glucosamine-Chondroitin 500-400 MG CAPS Take 500 mg by mouth daily.     Marland Kitchen L-Arginine 1000 MG TABS Take 1,000 mg by mouth daily.    . Lecithin 1200 MG CAPS Take 1,200 mg by mouth daily. Triple    . Magnesium 500 MG TABS Take 500 mg by mouth daily.     . Multiple Vitamin (MULTIVITAMIN WITH MINERALS) TABS tablet Take 1 tablet by mouth daily.    . nitroGLYCERIN (NITROSTAT) 0.4 MG SL tablet Place 1 tablet (0.4 mg total) under the tongue every 5 (five) minutes as needed for chest pain. 30 tablet 0  . Omega 3 1200 MG CAPS Take 1,200 mg by mouth daily.    . Omega-3 Fatty Acids (FISH OIL PO) Take 2,500 capsules by mouth daily. 1250 mg plus    . pravastatin (PRAVACHOL) 40 MG tablet TAKE 1/2 TABLET BY MOUTH EVERY MONDAY, WEDNESDAY AND FRIDAY. (Patient taking differently: Take 40 mg by mouth every Monday, Wednesday, and Friday.) 36 tablet 3  . selenium 200 MCG TABS tablet Take 200 mcg by mouth daily. Plus Vitamin E    . sildenafil (REVATIO) 20 MG tablet TAKE 2 TO 3 TABLETS BY MOUTH AS NEEDED FOR SEXUAL ACTIVITY (Patient taking differently: Take 60-80 mg by mouth daily as needed (ed).) 60 tablet 4  . triamcinolone (KENALOG) 0.1 % Apply topically.    . zinc gluconate 50 MG tablet Take 50 mg by mouth daily.     No current facility-administered medications for this visit.    ROS:   General:  No weight loss, Fever, chills  HEENT: No recent headaches, no nasal bleeding, no visual changes, no sore throat  Neurologic: No dizziness, blackouts, seizures. No recent symptoms of stroke or mini- stroke. No recent episodes of slurred speech, or temporary blindness.  Cardiac: No recent episodes of chest pain/pressure, no shortness of breath at rest.  No shortness of breath with exertion.  Denies history of atrial fibrillation or irregular  heartbeat  Vascular: No history of rest pain in feet.  No history of claudication.  No history of non-healing ulcer, No history of DVT   Pulmonary: No home oxygen, no productive cough, no hemoptysis,  No asthma or wheezing  Musculoskeletal:  [ ]  Arthritis, [ ]  Low back pain,  [x ] Joint pain  Hematologic:No history of hypercoagulable state.  No history of easy bleeding.  No history of anemia  Gastrointestinal: No hematochezia or melena,  No gastroesophageal reflux, no trouble swallowing  Urinary: [ ]  chronic Kidney disease, [ ]  on HD - [ ]  MWF or [ ]  TTHS, [ ]  Burning with urination, [ ]  Frequent urination, [ ]  Difficulty urinating;   Skin: No rashes  Psychological: No history of anxiety,  No history of depression   Physical Examination  Vitals:   01/30/21 1140 01/30/21 1142  BP: 140/62 (!) 136/59  Pulse: (!) 57   Resp: 20   Temp: 98.6 F (37 C)   TempSrc: Temporal   SpO2: 100%   Weight: 192 lb 11.2 oz (87.4 kg)   Height: 6' (1.829 m)     Body mass index is 26.13 kg/m.  General:  Alert and oriented, no acute distress HEENT: Normal Neck: No bruit or JVD Pulmonary: Clear to auscultation bilaterally Cardiac: Regular Rate and Rhythm without murmur Gastrointestinal: Soft, non-tender, non-distended, no mass, no scars Skin: No rash Musculoskeletal: No deformity or edema  Neurologic: Upper and lower extremity motor 5/5 and symmetric  DATA:    Right Carotid Findings:  +----------+--------+--------+--------+------------------+--------+       PSV cm/sEDV cm/sStenosisPlaque DescriptionComments  +----------+--------+--------+--------+------------------+--------+  CCA Prox 102   18                      +----------+--------+--------+--------+------------------+--------+  CCA Mid  93   17   <50%  homogeneous          +----------+--------+--------+--------+------------------+--------+  CCA Distal96   22    <50%  homogeneous          +----------+--------+--------+--------+------------------+--------+  ICA Prox 96   23   1-39%  homogeneous          +----------+--------+--------+--------+------------------+--------+  ICA Mid  92   18                      +----------+--------+--------+--------+------------------+--------+  ICA Distal99   29                      +----------+--------+--------+--------+------------------+--------+  ECA    189   18                      +----------+--------+--------+--------+------------------+--------+   +----------+--------+-------+----------------+-------------------+       PSV cm/sEDV cmsDescribe    Arm Pressure (mmHG)  +----------+--------+-------+----------------+-------------------+  [id number]   0   Multiphasic, WNL            +----------+--------+-------+----------------+-------------------+   +---------+--------+--+--------+-+---------+  VertebralPSV cm/s48EDV cm/s0Antegrade  +---------+--------+--+--------+-+---------+      Left Carotid Findings:  +----------+--------+--------+--------+------------------+--------+       PSV cm/sEDV cm/sStenosisPlaque DescriptionComments  +----------+--------+--------+--------+------------------+--------+  CCA Prox 96   17                      +----------+--------+--------+--------+------------------+--------+  CCA Mid  108   22   <50%  homogeneous          +----------+--------+--------+--------+------------------+--------+  CCA Distal99   19   <50%  heterogenous         +----------+--------+--------+--------+------------------+--------+  ICA Prox 107   23   1-39%  smooth             +----------+--------+--------+--------+------------------+--------+  ICA Mid  100   28                      +----------+--------+--------+--------+------------------+--------+  ICA Distal99  16                      +----------+--------+--------+--------+------------------+--------+  ECA    209   26                      +----------+--------+--------+--------+------------------+--------+   +----------+--------+--------+----------------+-------------------+       PSV cm/sEDV cm/sDescribe    Arm Pressure (mmHG)  +----------+--------+--------+----------------+-------------------+  [id number]   0    Multiphasic, WNL            +----------+--------+--------+----------------+-------------------+   +---------+--------+--+--------+--+---------+  VertebralPSV cm/s64EDV cm/s13Antegrade  +---------+--------+--+--------+--+---------+        Summary:  Right Carotid: Velocities in the right ICA are consistent with a 1-39%  stenosis.         Non-hemodynamically significant plaque <50% noted in the  CCA.   Left Carotid: Velocities in the left ICA are consistent with a 1-39%  stenosis.        Non-hemodynamically significant plaque <50% noted in the  CCA.   Vertebrals: Bilateral vertebral arteries demonstrate antegrade flow.  Subclavians: Normal flow hemodynamics were seen in bilateral subclavian        arteries.   ASSESSMENT:  S/P right carotid endarterectomy by Dr. Bridgett Larsson in 2014. Duplex shows no change in ICA stenosis B, and he is asymptomatic for stroke/TIA symptoms.    PLAN: Cont. Statin daily.  Activity as tolerates.  If he develops symptoms of stroke /TIA he will call 911.  F/U for repeat carotid duplex.     Roxy Horseman PA-C [facility] Office: [phone]  MD in clinic [place]

## 2021-02-09 ENCOUNTER — Telehealth: Payer: Self-pay | Admitting: Cardiology

## 2021-02-09 NOTE — Telephone Encounter (Signed)
Spoke to patient. He understood that his spouse would have to go through her PCP to get a specialty referral, even more so because she is not a patient of Dr. Harl Bowie.

## 2021-02-09 NOTE — Telephone Encounter (Signed)
Patient would like to speak to Dr Harl Bowie about a referral for his wife (not a patient ) to a urologist at [facility].

## 2021-02-16 DIAGNOSIS — D485 Neoplasm of uncertain behavior of skin: Secondary | ICD-10-CM | POA: Diagnosis not present

## 2021-02-16 DIAGNOSIS — Z85828 Personal history of other malignant neoplasm of skin: Secondary | ICD-10-CM | POA: Diagnosis not present

## 2021-02-16 DIAGNOSIS — L2084 Intrinsic (allergic) eczema: Secondary | ICD-10-CM | POA: Diagnosis not present

## 2021-02-16 DIAGNOSIS — L905 Scar conditions and fibrosis of skin: Secondary | ICD-10-CM | POA: Diagnosis not present

## 2021-02-16 DIAGNOSIS — D235 Other benign neoplasm of skin of trunk: Secondary | ICD-10-CM | POA: Diagnosis not present

## 2021-04-13 ENCOUNTER — Other Ambulatory Visit: Payer: Self-pay | Admitting: Physician Assistant

## 2021-04-13 NOTE — Telephone Encounter (Signed)
This is a [place] pt.  °

## 2021-04-19 ENCOUNTER — Other Ambulatory Visit: Payer: Self-pay

## 2021-04-19 ENCOUNTER — Encounter: Payer: Self-pay | Admitting: Internal Medicine

## 2021-04-19 ENCOUNTER — Ambulatory Visit: Payer: Medicare Other | Admitting: Internal Medicine

## 2021-04-19 VITALS — BP 148/72 | HR 59 | Ht 72.0 in | Wt 192.0 lb

## 2021-04-19 DIAGNOSIS — R413 Other amnesia: Secondary | ICD-10-CM | POA: Diagnosis not present

## 2021-04-19 DIAGNOSIS — I4891 Unspecified atrial fibrillation: Secondary | ICD-10-CM

## 2021-04-19 DIAGNOSIS — Z79899 Other long term (current) drug therapy: Secondary | ICD-10-CM | POA: Diagnosis not present

## 2021-04-19 DIAGNOSIS — I480 Paroxysmal atrial fibrillation: Secondary | ICD-10-CM | POA: Diagnosis not present

## 2021-04-19 NOTE — Progress Notes (Signed)
HPI [name] returns today for followup of his PAF. He is a pleasant 87 yo man who still works who has developed atrial fib. He has been started on dofetilide and mostly stays in NSR. He has no symptoms of atrial fib except that when he has a prolonged episode of atrial fib he gets tired. He checks his bp regularly and notes that he has HR's in the 90-100 range in atrial fib and in the 50s in NSR. He denies peripheral edema.  Allergies  Allergen Reactions  . Lipitor [Atorvastatin Calcium] Other (See Comments)    Muscle pain  . Vantin [Cefpodoxime] Rash     Current Outpatient Medications  Medication Sig Dispense Refill  . ALPHA LIPOIC ACID PO Take 250 mg by mouth daily.     Marland Kitchen b complex vitamins capsule Take 1 capsule by mouth every other day.    . Cholecalciferol (VITAMIN D3) 75 MCG (3000 UT) TABS Take 3,000 Units by mouth daily.     . Coenzyme Q10 (COQ10) 400 MG CAPS Take 800 mg by mouth in the morning and at bedtime.     Marland Kitchen DHEA 25 MG CAPS Take 25 mg by mouth daily.    Marland Kitchen dofetilide (TIKOSYN) 250 MCG capsule TAKE (1) CAPSULE BY MOUTH TWICE DAILY. 60 capsule 0  . ELIQUIS 5 MG TABS tablet TAKE (1) TABLET BY MOUTH TWICE DAILY. 180 tablet 1  . Glucosamine-Chondroitin 500-400 MG CAPS Take 500 mg by mouth daily.     Marland Kitchen L-Arginine 1000 MG TABS Take 1,000 mg by mouth daily.    . Lecithin 1200 MG CAPS Take 1,200 mg by mouth daily. Triple    . Magnesium 500 MG TABS Take 500 mg by mouth daily.     . Multiple Vitamin (MULTIVITAMIN WITH MINERALS) TABS tablet Take 1 tablet by mouth daily.    . nitroGLYCERIN (NITROSTAT) 0.4 MG SL tablet Place 1 tablet (0.4 mg total) under the tongue every 5 (five) minutes as needed for chest pain. 30 tablet 0  . Omega 3 1200 MG CAPS Take 1,200 mg by mouth daily.    . pravastatin (PRAVACHOL) 40 MG tablet TAKE 1/2 TABLET BY MOUTH EVERY MONDAY, WEDNESDAY AND FRIDAY. 36 tablet 3  . selenium 200 MCG TABS tablet Take 200 mcg by mouth daily. Plus Vitamin E    .  sildenafil (REVATIO) 20 MG tablet TAKE 2 TO 3 TABLETS BY MOUTH AS NEEDED FOR SEXUAL ACTIVITY 60 tablet 4  . zinc gluconate 50 MG tablet Take 50 mg by mouth daily.     No current facility-administered medications for this visit.     Past Medical History:  Diagnosis Date  . Arthritis    neck   . Atrial fibrillation ([place])    a. new-onset in 01/2017. Started on Eliquis  . CAD (coronary artery disease), native coronary artery 05/16/2017   LAD 95%>>0 w/ 2.5 x 16 mm Synergy DES, D2 40%, RCA 50%, EF 55-65%, LVEDP mod elevated  . Calcification of coronary artery    a. mild, nonobstructive CAD by cath in 2009.  Marland Kitchen Cancer of skin of chest   . Carotid artery occlusion    a. s/p R CEA in 2014  . Colon polyps   . Hyperlipidemia    takes Pravastatin every other day  . Pneumonia ~ 01/2013    ROS:   All systems reviewed and negative except as noted in the HPI.   Past Surgical History:  Procedure Laterality Date  .  CARDIAC CATHETERIZATION  06/09/1999   mild CAD (LAD, diagonal, RCA) - Dr. Loni Muse. [place]  . CARDIOVERSION N/A 03/25/2017   Procedure: CARDIOVERSION;  Surgeon: Pixie Casino, MD;  Location: [facility] ENDOSCOPY;  Service: Cardiovascular;  Laterality: N/A;  . CARDIOVERSION N/A 09/28/2020   Procedure: CARDIOVERSION;  Surgeon: [name], MD;  Location: [facility] ENDOSCOPY;  Service: Cardiovascular;  Laterality: N/A;  . CAROTID DOPPLER  02/2012   60-79% RICA stenosis, 56-43% LICA stenosis (prior to carotid endarterectomy)  . COLONOSCOPY    . CORONARY STENT INTERVENTION N/A 05/16/2017   Procedure: Coronary Stent Intervention;  Surgeon: Jettie Booze, MD;  Location: [place] CV LAB;  Service: Cardiovascular;  Laterality: N/A;  . ENDARTERECTOMY Right 11/10/2013   Procedure: ENDARTERECTOMY CAROTID;  Surgeon: Conrad DeLisle, MD;  Location: [place];  Service: Vascular;  Laterality: Right;  . INGUINAL HERNIA REPAIR Left   . LEFT HEART CATH AND CORONARY ANGIOGRAPHY N/A 05/16/2017   Procedure: Left  Heart Cath and Coronary Angiography;  Surgeon: Jettie Booze, MD;  Location: [place] [facility];  Service: Cardiovascular;  Laterality: N/A;  . MOLE REMOVAL  1990s   "chest"  . NM MYOCAR PERF WALL MOTION  04/2011   bruce myoview - perfusion defect in inferior myocardium (diaphragmatic attenuation), remaining myocardium with normal perfusion, EF 67%  . PATCH ANGIOPLASTY Right 11/10/2013   Procedure: PATCH ANGIOPLASTY;  Surgeon: Conrad Dallastown, MD;  Location: [place];  Service: Vascular;  Laterality: Right;  . PILONIDAL CYST DRAINAGE    . SHOULDER OPEN ROTATOR CUFF REPAIR Bilateral 1998 - 2001  . SKIN CANCER EXCISION  ~ 2015   "chest"  . TONSILLECTOMY       Family History  Problem Relation Age of Onset  . Stroke Father   . Hypertension Mother   . Prostate cancer Son   . Stroke Brother   . Cancer Brother        spine     Social History   Socioeconomic History  . Marital status: Married    Spouse name: Not on file  . Number of children: 3  . Years of education: Not on file  . Highest education level: Not on file  Occupational History    Employer: [employer]  Tobacco Use  . Smoking status: Former Smoker    Packs/day: 1.00    Years: 18.00    Pack years: 18.00    Types: Cigarettes    Quit date: 12/03/1972    Years since quitting: 48.4  . Smokeless tobacco: Never Used  Vaping Use  . Vaping Use: Never used  Substance and Sexual Activity  . Alcohol use: Yes    Alcohol/week: 14.0 standard drinks    Types: 7 Glasses of wine, 7 Shots of liquor per week  . Drug use: No  . Sexual activity: Yes  Other Topics Concern  . Not on file  Social History Narrative  . Not on file   Social Determinants of Health   Financial Resource Strain: Not on file  Food Insecurity: Not on file  Transportation Needs: Not on file  Physical Activity: Not on file  Stress: Not on file  Social Connections: Not on file  Intimate Partner Violence: Not on file     BP (!) 148/72   Pulse (!)  59   Ht 6' (1.829 m)   Wt 192 lb (87.1 kg)   SpO2 96%   BMI 26.04 kg/m   Physical Exam:  Well appearing NAD HEENT: Unremarkable Neck:  No JVD, no thyromegally Lymphatics:  No adenopathy Back:  No CVA tenderness Lungs:  Clear HEART:  Regular rate rhythm, no murmurs, no rubs, no clicks Abd:  soft, positive bowel sounds, no organomegally, no rebound, no guarding Ext:  2 plus pulses, no edema, no cyanosis, no clubbing Skin:  No rashes no nodules Neuro:  CN II through XII intact, motor grossly intact  EKG - nsr with rbbb   Assess/Plan: 1. PAF - he will continue dofetilide. His atrial fib has been quiet.. If his atrial fib begins to be more persistent we could consider other treatment options, particularly rate control. 2. Coags - he will continue his Eliquis. 3. CAD - he denies anginal symptoms.  4. Dyslipidemia - he will continue pravachol  Carleene Overlie Brynley Cuddeback,MD

## 2021-04-19 NOTE — Patient Instructions (Signed)
Medication Instructions:  Your physician recommends that you continue on your current medications as directed. Please refer to the Current Medication list given to you today.  *If you need a refill on your cardiac medications before your next appointment, please call your pharmacy*   Lab Work: None ordered   Testing/Procedures: None ordered   Follow-Up: At [facility], you and your health needs are our priority.  As part of our continuing mission to provide you with exceptional heart care, we have created designated Provider Care Teams.  These Care Teams include your primary Cardiologist (physician) and Advanced Practice Providers (APPs -  Physician Assistants and Nurse Practitioners) who all work together to provide you with the care you need, when you need it.  Your next appointment:   1 year(s)  The format for your next appointment:   In Person  Provider:   Gregg Taylor, MD    Thank you for choosing [facility]!!   

## 2021-04-20 NOTE — Addendum Note (Signed)
Addended by: Berlinda Last on: 04/20/2021 08:24 AM   Modules accepted: Orders

## 2021-05-02 DIAGNOSIS — I480 Paroxysmal atrial fibrillation: Secondary | ICD-10-CM | POA: Diagnosis not present

## 2021-05-02 DIAGNOSIS — I700 Atherosclerosis of aorta: Secondary | ICD-10-CM | POA: Diagnosis not present

## 2021-05-02 DIAGNOSIS — E782 Mixed hyperlipidemia: Secondary | ICD-10-CM | POA: Diagnosis not present

## 2021-06-06 DIAGNOSIS — H40011 Open angle with borderline findings, low risk, right eye: Secondary | ICD-10-CM | POA: Diagnosis not present

## 2021-06-06 DIAGNOSIS — H40022 Open angle with borderline findings, high risk, left eye: Secondary | ICD-10-CM | POA: Diagnosis not present

## 2021-06-06 DIAGNOSIS — H25813 Combined forms of age-related cataract, bilateral: Secondary | ICD-10-CM | POA: Diagnosis not present

## 2021-06-08 ENCOUNTER — Telehealth: Payer: Self-pay

## 2021-06-08 NOTE — Telephone Encounter (Signed)
Assess/Plan: 1. PAF - he will continue dofetilide. His atrial fib has been quiet.. If his atrial fib begins to be more persistent we could consider other treatment options, particularly rate control. 2. Coags - he will continue his Eliquis. 3. CAD - he denies anginal symptoms. 4. Dyslipidemia - he will continue pravachol   Sean Miranda   The above note was from 04/2021 visit with Dr.Taylor.     I spoke with patient and told him I would message Dr.Taylor to discuss his increased heart rate and will call him back with medication adjustments as directed.    Patient agreeable with dispo.

## 2021-06-08 NOTE — Telephone Encounter (Signed)
Lets try 25 mg daily of toprol xl. GT

## 2021-06-08 NOTE — Telephone Encounter (Signed)
New message    STAT if HR is under 50 or over 120 (normal HR is 60-100 beats per minute)  What is your heart rate? 97 sitting still  hr is usually in the 50's  Do you have a log of your heart rate readings (document readings)? no  Do you have any other symptoms? Feels like his heart is racing , wants to come in ASAP

## 2021-06-09 MED ORDER — METOPROLOL SUCCINATE ER 25 MG PO TB24: 25.0000 mg | ORAL_TABLET | Freq: Every day | ORAL | 3 refills | Status: DC

## 2021-06-09 NOTE — Telephone Encounter (Signed)
I spoke with patient and he agrees to start Toprol Xl 25 mg daily. I have e-scribed to [place]. He will let us know at a later date how he is doing.

## 2021-06-13 ENCOUNTER — Telehealth: Payer: Self-pay | Admitting: Cardiology

## 2021-06-13 NOTE — Telephone Encounter (Signed)
A STENT SYNERGY DES 2.5X16 drug eluting stent was successfully placed, postdilated to 3.0 mm.    Heart cath 05/2017 by Dr.Varanasi   I will forward to Dr.Branch

## 2021-06-13 NOTE — Telephone Encounter (Signed)
Pt is filling out forms and he's needing to know if his Stent is metal.

## 2021-06-14 NOTE — Telephone Encounter (Signed)
The SYNERGY Stent is made of a Platinum Chromium Alloy (PtCr), which consists of platinum, chromium, iron, nickel, and molybdenum. If the forms have to do with an MRI the stent does not limit that   Zandra Abts MD

## 2021-06-21 ENCOUNTER — Other Ambulatory Visit: Payer: Self-pay | Admitting: Internal Medicine

## 2021-06-21 NOTE — Telephone Encounter (Signed)
Prescription refill request for Eliquis received. Indication:afib Last office visit:taylor 04/19/21 Scr:1.04 04/19/21 Age: 51m Weight:87.1kg

## 2021-06-26 DIAGNOSIS — H25811 Combined forms of age-related cataract, right eye: Secondary | ICD-10-CM | POA: Diagnosis not present

## 2021-06-26 DIAGNOSIS — H25812 Combined forms of age-related cataract, left eye: Secondary | ICD-10-CM | POA: Diagnosis not present

## 2021-06-30 DIAGNOSIS — H2511 Age-related nuclear cataract, right eye: Secondary | ICD-10-CM | POA: Diagnosis not present

## 2021-06-30 DIAGNOSIS — H25811 Combined forms of age-related cataract, right eye: Secondary | ICD-10-CM | POA: Diagnosis not present

## 2021-07-13 DIAGNOSIS — H25812 Combined forms of age-related cataract, left eye: Secondary | ICD-10-CM | POA: Diagnosis not present

## 2021-07-13 DIAGNOSIS — H25811 Combined forms of age-related cataract, right eye: Secondary | ICD-10-CM | POA: Diagnosis not present

## 2021-07-13 DIAGNOSIS — H2512 Age-related nuclear cataract, left eye: Secondary | ICD-10-CM | POA: Diagnosis not present

## 2021-07-15 ENCOUNTER — Other Ambulatory Visit: Payer: Self-pay | Admitting: Cardiology

## 2021-07-20 DIAGNOSIS — H524 Presbyopia: Secondary | ICD-10-CM | POA: Diagnosis not present

## 2021-09-25 DIAGNOSIS — R413 Other amnesia: Secondary | ICD-10-CM | POA: Diagnosis not present

## 2021-09-26 ENCOUNTER — Other Ambulatory Visit (HOSPITAL_COMMUNITY): Payer: Self-pay | Admitting: Internal Medicine

## 2021-09-26 DIAGNOSIS — R413 Other amnesia: Secondary | ICD-10-CM

## 2021-10-13 ENCOUNTER — Ambulatory Visit (HOSPITAL_COMMUNITY)
Admission: RE | Admit: 2021-10-13 | Discharge: 2021-10-13 | Disposition: A | Discharge status: Home / Self Care | Payer: Medicare Other | Source: Ambulatory Visit | Attending: Internal Medicine | Admitting: Internal Medicine

## 2021-10-13 ENCOUNTER — Other Ambulatory Visit: Payer: Self-pay

## 2021-10-13 DIAGNOSIS — R413 Other amnesia: Secondary | ICD-10-CM | POA: Insufficient documentation

## 2021-10-13 MED ORDER — GADOBUTROL 1 MMOL/ML IV SOLN
9.0000 mL | Freq: Once | INTRAVENOUS | Status: AC | PRN
  Administered 2021-10-13: 9 mL via INTRAVENOUS

## 2021-11-01 DIAGNOSIS — R413 Other amnesia: Secondary | ICD-10-CM | POA: Diagnosis not present

## 2021-12-06 ENCOUNTER — Telehealth: Payer: Self-pay | Admitting: Cardiology

## 2021-12-06 NOTE — Telephone Encounter (Signed)
PCP will not see patient (covid was over a month ago) daughter is concerned he is weak and wants his heart and lungs checked.next available was May.    I will message Dr.Branch for input.

## 2021-12-06 NOTE — Telephone Encounter (Signed)
Patient's daughter is extremely concerned. She states last month the patient had COVID. He is no longer positive, but she states he is declining and becoming weaker by the day. She would like a call back to further discuss and to determine whether patient needs to be worked in on schedule.

## 2021-12-07 NOTE — Telephone Encounter (Signed)
Apt opened for 12/11/21 with M.Lenze, PA-C, daughter will bring patient

## 2021-12-08 NOTE — Telephone Encounter (Signed)
Will see how jan 9 appt goes  Zandra Abts MD

## 2021-12-11 ENCOUNTER — Other Ambulatory Visit (HOSPITAL_COMMUNITY)
Admission: RE | Admit: 2021-12-11 | Discharge: 2021-12-11 | Disposition: A | Discharge status: Home / Self Care | Payer: Medicare Other | Source: Ambulatory Visit | Attending: Physician Assistant | Admitting: Physician Assistant

## 2021-12-11 ENCOUNTER — Other Ambulatory Visit: Payer: Self-pay

## 2021-12-11 ENCOUNTER — Ambulatory Visit: Payer: Medicare Other | Admitting: Physician Assistant

## 2021-12-11 ENCOUNTER — Encounter: Payer: Self-pay | Admitting: Physician Assistant

## 2021-12-11 VITALS — BP 126/60 | HR 58 | Ht 72.0 in | Wt 192.0 lb

## 2021-12-11 DIAGNOSIS — R531 Weakness: Secondary | ICD-10-CM

## 2021-12-11 DIAGNOSIS — I4819 Other persistent atrial fibrillation: Secondary | ICD-10-CM

## 2021-12-11 DIAGNOSIS — I6523 Occlusion and stenosis of bilateral carotid arteries: Secondary | ICD-10-CM

## 2021-12-11 DIAGNOSIS — I2510 Atherosclerotic heart disease of native coronary artery without angina pectoris: Secondary | ICD-10-CM | POA: Insufficient documentation

## 2021-12-11 DIAGNOSIS — E782 Mixed hyperlipidemia: Secondary | ICD-10-CM | POA: Diagnosis not present

## 2021-12-11 DIAGNOSIS — I4510 Unspecified right bundle-branch block: Secondary | ICD-10-CM

## 2021-12-11 LAB — COMPREHENSIVE METABOLIC PANEL
ALT: 13 U/L (ref 0–44)
AST: 17 U/L (ref 15–41)
Albumin: 3.7 g/dL (ref 3.5–5.0)
Alkaline Phosphatase: 57 U/L (ref 38–126)
Anion gap: 6 (ref 5–15)
BUN: 14 mg/dL (ref 8–23)
CO2: 26 mmol/L (ref 22–32)
Calcium: 8.9 mg/dL (ref 8.9–10.3)
Chloride: 98 mmol/L (ref 98–111)
Creatinine, Ser: 0.87 mg/dL (ref 0.61–1.24)
GFR, Estimated: >60 mL/min (ref >60)
Glucose, Bld: 94 mg/dL (ref 70–99)
Potassium: 4.5 mmol/L (ref 3.5–5.1)
Sodium: 130 mmol/L — ABNORMAL LOW (ref 135–145)
Total Bilirubin: 0.8 mg/dL (ref 0.3–1.2)
Total Protein: 7 g/dL (ref 6.5–8.1)

## 2021-12-11 LAB — CBC
HCT: 37.8 % — ABNORMAL LOW (ref 39.0–52.0)
Hemoglobin: 13.2 g/dL (ref 13.0–17.0)
MCH: 32.8 pg (ref 26.0–34.0)
MCHC: 34.9 g/dL (ref 30.0–36.0)
MCV: 94 fL (ref 80.0–100.0)
Platelets: 235 10*3/uL (ref 150–400)
RBC: 4.02 MIL/uL — ABNORMAL LOW (ref 4.22–5.81)
RDW: 12.4 % (ref 11.5–15.5)
WBC: 7.6 10*3/uL (ref 4.0–10.5)
nRBC: 0 % (ref 0.0–0.2)

## 2021-12-11 LAB — TSH: TSH: 1.591 u[IU]/mL (ref 0.350–4.500)

## 2021-12-11 NOTE — Patient Instructions (Signed)
°  Medication Instructions:   Your physician recommends that you continue on your current medications as directed. Please refer to the Current Medication list given to you today.   Labwork:  CBC,CMET,TSH  Testing/Procedures: None   Follow-Up: Keep 04/11/22 apt with Dr.Branch  Any Other Special Instructions Will Be Listed Below (If Applicable).  If you need a refill on your cardiac medications before your next appointment, please call your pharmacy.

## 2021-12-11 NOTE — Progress Notes (Signed)
Cardiology Office Note    Date:  12/11/2021   ID:  [name], DOB 09/17/1930, MRN [id number]   PCP:  Billie Ruddy, MD   [place]  Cardiologist:  Carlyle Dolly, MD   Advanced Practice Provider:  No care team member to display Electrophysiologist:  None   [phone]   Chief Complaint  Patient presents with   Follow-up    History of Present Illness:  Sean Miranda is a 88 y.o. male  w/ hx persistent afib, on flecainide and Eliquis, R-CEA, HLD, OA, RBBB & LAFB, non-obs dz by remote cath, DES mLAD 2018, normal NST 2020.  Last saw Dr. Harl Bowie 12/26/20 and Dr. Lovena Le 04/2021 doing well.   Patient added onto my schedule because of worsening weakness since covid19 one month ago. Patient says everything is much better this week. Denies chest pain, dyspnea, palpitations, dizziness or edema. No bleeding problems.   Past Medical History:  Diagnosis Date   Arthritis    neck    Atrial fibrillation ([place])    a. new-onset in 01/2017. Started on Eliquis   CAD (coronary artery disease), native coronary artery 05/16/2017   LAD 95%>>0 w/ 2.5 x 16 mm Synergy DES, D2 40%, RCA 50%, EF 55-65%, LVEDP mod elevated   Calcification of coronary artery    a. mild, nonobstructive CAD by cath in 2009.   Cancer of skin of chest    Carotid artery occlusion    a. s/p R CEA in 2014   Colon polyps    Hyperlipidemia    takes Pravastatin every other day   Pneumonia ~ 01/2013    Past Surgical History:  Procedure Laterality Date   CARDIAC CATHETERIZATION  06/09/1999   mild CAD (LAD, diagonal, RCA) - Dr. Rockne Menghini   CARDIOVERSION N/A 03/25/2017   Procedure: CARDIOVERSION;  Surgeon: Pixie Casino, MD;  Location: [facility] ENDOSCOPY;  Service: Cardiovascular;  Laterality: N/A;   CARDIOVERSION N/A 09/28/2020   Procedure: CARDIOVERSION;  Surgeon: [name], MD;  Location[facility] ENDOSCOPY;  Service: Cardiovascular;  Laterality: N/A;   CAROTID DOPPLER  02/2012   60-79% RICA  stenosis, 44-31% LICA stenosis (prior to carotid endarterectomy)   COLONOSCOPY     CORONARY STENT INTERVENTION N/A 05/16/2017   Procedure: Coronary Stent Intervention;  Surgeon: Jettie Booze, MD;  Location: [place] CV LAB;  Service: Cardiovascular;  Laterality: N/A;   ENDARTERECTOMY Right 11/10/2013   Procedure: ENDARTERECTOMY CAROTID;  Surgeon: Conrad [place], MD;  Location: [place];  Service: Vascular;  Laterality: Right;   INGUINAL HERNIA REPAIR Left    LEFT HEART CATH AND CORONARY ANGIOGRAPHY N/A 05/16/2017   Procedure: Left Heart Cath and Coronary Angiography;  Surgeon: Jettie Booze, MD;  Location: [place] CV LAB;  Service: Cardiovascular;  Laterality: N/A;   MOLE REMOVAL  1990s   "chest"   NM MYOCAR PERF WALL MOTION  04/2011   bruce myoview - perfusion defect in inferior myocardium (diaphragmatic attenuation), remaining myocardium with normal perfusion, EF 67%   PATCH ANGIOPLASTY Right 11/10/2013   Procedure: PATCH ANGIOPLASTY;  Surgeon: Conrad [place], MD;  Location: [place];  Service: Vascular;  Laterality: Right;   PILONIDAL CYST DRAINAGE     SHOULDER OPEN ROTATOR CUFF REPAIR Bilateral [place]  ~ 2015   "chest"   TONSILLECTOMY      Current Medications: Current Meds  Medication Sig   ALPHA LIPOIC ACID PO Take 250 mg by  mouth daily.    apixaban (ELIQUIS) 5 MG TABS tablet TAKE (1) TABLET BY MOUTH TWICE DAILY.   b complex vitamins capsule Take 1 capsule by mouth every other day.   Cholecalciferol (VITAMIN D3) 75 MCG (3000 UT) TABS Take 3,000 Units by mouth daily.    Coenzyme Q10 (COQ10) 400 MG CAPS Take 800 mg by mouth in the morning and at bedtime.    DHEA 25 MG CAPS Take 25 mg by mouth daily.   dofetilide (TIKOSYN) 250 MCG capsule TAKE (1) CAPSULE BY MOUTH TWICE DAILY.   Glucosamine-Chondroitin 500-400 MG CAPS Take 500 mg by mouth daily.    L-Arginine 1000 MG TABS Take 1,000 mg by mouth daily.   Lecithin 1200 MG CAPS Take 1,200 mg by  mouth daily. Triple   Magnesium 500 MG TABS Take 500 mg by mouth daily.    metoprolol succinate (TOPROL XL) 25 MG 24 hr tablet Take 1 tablet (25 mg total) by mouth daily.   Multiple Vitamin (MULTIVITAMIN WITH MINERALS) TABS tablet Take 1 tablet by mouth daily.   nitroGLYCERIN (NITROSTAT) 0.4 MG SL tablet Place 1 tablet (0.4 mg total) under the tongue every 5 (five) minutes as needed for chest pain.   Omega 3 1200 MG CAPS Take 1,200 mg by mouth daily.   pravastatin (PRAVACHOL) 40 MG tablet TAKE 1/2 TABLET BY MOUTH EVERY MONDAY, WEDNESDAY AND FRIDAY.   selenium 200 MCG TABS tablet Take 200 mcg by mouth daily. Plus Vitamin E   sildenafil (REVATIO) 20 MG tablet TAKE 2 TO 3 TABLETS BY MOUTH AS NEEDED FOR SEXUAL ACTIVITY   zinc gluconate 50 MG tablet Take 50 mg by mouth daily.     Allergies:   Lipitor [atorvastatin calcium] and Vantin [cefpodoxime]   Social History   Socioeconomic History   Marital status: Married    Spouse name: Not on file   Number of children: 3   Years of education: Not on file   Highest education level: Not on file  Occupational History    Employer: [employer]  Tobacco Use   Smoking status: Former    Packs/day: 1.00    Years: 18.00    Pack years: 18.00    Types: Cigarettes    Quit date: 12/03/1972    Years since quitting: 49.0   Smokeless tobacco: Never  Vaping Use   Vaping Use: Never used  Substance and Sexual Activity   Alcohol use: Yes    Alcohol/week: 14.0 standard drinks    Types: 7 Glasses of wine, 7 Shots of liquor per week   Drug use: No   Sexual activity: Yes  Other Topics Concern   Not on file  Social History Narrative   Not on file   Social Determinants of Health   Financial Resource Strain: Not on file  Food Insecurity: Not on file  Transportation Needs: Not on file  Physical Activity: Not on file  Stress: Not on file  Social Connections: Not on file     Family History:  The patient's  family history includes Cancer in his brother;  Hypertension in his mother; Prostate cancer in his son; Stroke in his brother and father.   ROS:   Please see the history of present illness.    ROS All other systems reviewed and are negative.   PHYSICAL EXAM:   VS:  BP 126/60    Pulse (!) 58    Ht 6' (1.829 m)    Wt 192 lb (87.1 kg)    SpO2  98%    BMI 26.04 kg/m   Physical Exam  GEN: Well nourished, well developed, in no acute distress  Neck: no JVD, carotid bruits, or masses Cardiac:RRR; no murmurs, rubs, or gallops  Respiratory:  clear to auscultation bilaterally, normal work of breathing GI: soft, nontender, nondistended, + BS Ext: without cyanosis, clubbing, or edema, Good distal pulses bilaterally Neuro:  Alert and Oriented x 3 Psych: euthymic mood, full affect  Wt Readings from Last 3 Encounters:  12/11/21 192 lb (87.1 kg)  04/19/21 192 lb (87.1 kg)  01/30/21 192 lb 11.2 oz (87.4 kg)      Studies/Labs Reviewed:   EKG:  EKG is  ordered today.  The ekg ordered today demonstrates Sinus bradycardia 56/m RBBB  Recent Labs: No results found for requested labs within last 8760 hours.   Lipid Panel    Component Value Date/Time   CHOL 197 01/28/2020 0819   CHOL 243 10/07/2019 0000   TRIG 71 01/28/2020 0819   TRIG 86 10/07/2019 0000   HDL 45 01/28/2020 0819   HDL 44 10/07/2019 0000   CHOLHDL 4.4 01/28/2020 0819   VLDL 14 01/28/2020 0819   LDLCALC 138 (H) 01/28/2020 0819   LDLDIRECT 149.3 01/05/2014 0940    Additional studies/ records that were reviewed today include:  Carotid dopplers 01/2021   Summary:  Right Carotid: Velocities in the right ICA are consistent with a 1-39%  stenosis.                Non-hemodynamically significant plaque <50% noted in the  CCA.   Left Carotid: Velocities in the left ICA are consistent with a 1-39%  stenosis.               Non-hemodynamically significant plaque <50% noted in the  CCA.   Vertebrals: Bilateral vertebral arteries demonstrate antegrade flow.  Subclavians:  Normal flow hemodynamics were seen in bilateral subclavian               arteries.   *See table(s) above for measurements and observations.      Electronically signed by Harold Barban MD on 01/30/2021 at 11:55:12 AM.        Final    Echo 09/2020 IMPRESSIONS     1. Left ventricular ejection fraction, by estimation, is 65 to 70%. The  left ventricle has normal function. The left ventricle has no regional  wall motion abnormalities. Left ventricular diastolic parameters are  indeterminate.   2. Right ventricular systolic function is normal. The right ventricular  size is normal.   3. Left atrial size was mild to moderately dilated.   4. The mitral valve is normal in structure. No evidence of mitral valve  regurgitation. No evidence of mitral stenosis.   5. The aortic valve was not well visualized. There is mild calcification  of the aortic valve. There is mild thickening of the aortic valve. Aortic  valve regurgitation is not visualized. No aortic stenosis is present.   6. The inferior vena cava is normal in size with greater than 50%  respiratory variability, suggesting right atrial pressure of 3 mmHg.   FINDINGS   Left Ventricle: Left ventricular ejection fraction, by estimation, is 65  to 70%. The left ventricle has normal function. The left ventricle has no  regional wall motion abnormalities. The left ventricular internal cavity  size was normal in size. There is   no left ventricular hypertrophy. Left ventricular diastolic parameters  are indeterminate.   Right Ventricle: The right ventricular  size is normal. Right vetricular  wall thickness was not assessed. Right ventricular systolic function is  normal.   Left Atrium: Left atrial size was mild to moderately dilated.   Right Atrium: Right atrial size was not well visualized.   Pericardium: There is no evidence of pericardial effusion.   Mitral Valve: The mitral valve is normal in structure. No evidence of   mitral valve regurgitation. No evidence of mitral valve stenosis.   Tricuspid Valve: The tricuspid valve is normal in structure. Tricuspid  valve regurgitation is not demonstrated. No evidence of tricuspid  stenosis.   Aortic Valve: The aortic valve was not well visualized. There is mild  calcification of the aortic valve. There is mild thickening of the aortic  valve. There is mild aortic valve annular calcification. Aortic valve  regurgitation is not visualized. No  aortic stenosis is present.   Pulmonic Valve: The pulmonic valve was not well visualized. Pulmonic valve  regurgitation is not visualized. No evidence of pulmonic stenosis.   Aorta: The aortic root is normal in size and structure.   Pulmonary Artery: Indeterminant PASP, inadequate TR jet.   Venous: The inferior vena cava is normal in size with greater than 50%  respiratory variability, suggesting right atrial pressure of 3 mmHg.   IAS/Shunts: No atrial level shunt detected by color flow Doppler.     LEFT VENTRICLE   ECHO: 05/15/2017 - Left ventricle: The cavity size was normal. Wall thickness was   increased in a pattern of mild LVH. Systolic function was normal.   The estimated ejection fraction was in the range of 60% to 65%.   Wall motion was normal; there were no regional wall motion   abnormalities. The study is not technically sufficient to allow   evaluation of LV diastolic function. - Aortic valve: Mildly calcified annulus. Trileaflet; moderately   thickened leaflets. Valve area (VTI): 2.98 cm^2. Valve area   (Vmax): 2.7 cm^2. Valve area (Vmean): 2.56 cm^2. - Left atrium: The atrium was mildly dilated. - Technically adequate study.   CATH: 05/16/2017 Mid RCA lesion, 50 %stenosed. Ost RCA lesion, 25 %stenosed. Lesion noted in small RV marginal. LM lesion, 25 %stenosed. 2nd Diag lesion, 40 %stenosed. Mid LAD-1 lesion, 20 %stenosed. Mid LAD-2 lesion, 95 %stenosed. A STENT SYNERGY DES 2.5X16 drug  eluting stent was successfully placed, postdilated to 3.0 mm. Post intervention, there is a 0% residual stenosis. The left ventricular systolic function is normal. LV end diastolic pressure is moderately elevated, 25 The left ventricular ejection fraction is 55-65% by visual estimate. There is no aortic valve stenosis.  LAD was the culprit for his unstable angina. He had symptoms during the procedure at rest. These resolved after stent placement. Watch overnight. Continue IV Angiomax for another hour.      Loaded with Plavix today. We'll start 75 mg daily tomorrow. Given aspirin today as well. Would plan for aspirin, Plavix and Eliquis for 30 days. After 30 days, would stop the aspirin and continue Plavix and Eliquis. A synergy drug-eluting stent was used since he is on long-term anticoagulation. If there is a bleeding complication, his antiplatelet therapy could be stopped sooner than usual. Ideally, he would get 6-12 months of Plavix depending on his bleeding risk profile.    Possible discharge tomorrow if he does well tonight and with cardiac rehabilitation tomorrow.   Risk Assessment/Calculations:    CHA2DS2-VASc Score = 4   This indicates a 4.8% annual risk of stroke. The patient's score is based upon: CHF  History: 0 HTN History: 1 Diabetes History: 0 Stroke History: 0 Vascular Disease History: 1 Age Score: 2 Gender Score: 0        ASSESSMENT:    1. Weakness   2. Persistent atrial fibrillation ([place])   3. Coronary artery disease involving native coronary artery of native heart without angina pectoris   4. Bilateral carotid artery stenosis   5. Mixed hyperlipidemia   6. RBBB      PLAN:  In order of problems listed above:  Weakness since Covid 19 infection last month. He actually is feeling much better today and getting strength back. No cardiac complaints, palpitations, or dizziness. EKG unchanged. Will order labs.  PAF on Eliquis and dofetilide followed by Dr. Lovena Le  sinus brady today, no bleeding on eliquis.  CAD nonobstructive on remote cath, normal NST 2018-no chest pain  RCEA  HLD-not fasting today, continue pravachol  RBBB & LAFB   Shared Decision Making/Informed Consent        Medication Adjustments/Labs and Tests Ordered: Current medicines are reviewed at length with the patient today.  Concerns regarding medicines are outlined above.  Medication changes, Labs and Tests ordered today are listed in the Patient Instructions below. Patient Instructions   Medication Instructions:   Your physician recommends that you continue on your current medications as directed. Please refer to the Current Medication list given to you today.   Labwork:  CBC,CMET,TSH  Testing/Procedures: None   Follow-Up: Keep 04/11/22 apt with Dr.Branch  Any Other Special Instructions Will Be Listed Below (If Applicable).  If you need a refill on your cardiac medications before your next appointment, please call your pharmacy.   Sumner Boast, PA-C  12/11/2021 2:38 PM    [facility] [address], [place], [place]  [address] Phone: [phone]; Fax: [phone]

## 2021-12-30 ENCOUNTER — Other Ambulatory Visit: Payer: Self-pay | Admitting: Internal Medicine

## 2022-01-01 NOTE — Telephone Encounter (Signed)
Eliquis 5 mg refill request received. Patient is 88 years old, weight- 87.1 kg, Crea- 0.87 on 1/9/2, Diagnosis- afib, and last seen by Ermalinda Barrios, PA on 12/11/20. Dose is appropriate based on dosing criteria. Will send in refill to requested pharmacy.

## 2022-01-11 DIAGNOSIS — C493 Malignant neoplasm of connective and soft tissue of thorax: Secondary | ICD-10-CM | POA: Diagnosis not present

## 2022-01-31 ENCOUNTER — Other Ambulatory Visit: Payer: Self-pay

## 2022-01-31 DIAGNOSIS — I6523 Occlusion and stenosis of bilateral carotid arteries: Secondary | ICD-10-CM

## 2022-02-07 NOTE — Progress Notes (Unsigned)
HISTORY AND PHYSICAL     CC:  follow up. Requesting Provider:  Billie Ruddy, MD  HPI: This is a 88 y.o. male here for follow up for carotid artery stenosis.  Pt is s/p right CEA for asymptomatic carotid artery stenosis on 11/10/2013 by Dr. Bridgett Larsson.    Pt was last seen 2/28/202 and at that time he was doing well without stroke sx.   Pt returns today for follow up.    Pt *** any amaurosis fugax, speech difficulties, weakness, numbness, paralysis or clumsiness or facial droop.    ***  The pt *** on a statin for cholesterol management.  The pt *** on a daily aspirin.   Other AC:  *** The pt *** on *** for hypertension.   The pt *** diabetic.   Tobacco hx:  ***  Pt does *** have family hx of AAA.  Past Medical History:  Diagnosis Date   Arthritis    neck    Atrial fibrillation ([place])    a. new-onset in 01/2017. Started on Eliquis   CAD (coronary artery disease), native coronary artery 05/16/2017   LAD 95%>>0 w/ 2.5 x 16 mm Synergy DES, D2 40%, RCA 50%, EF 55-65%, LVEDP mod elevated   Calcification of coronary artery    a. mild, nonobstructive CAD by cath in 2009.   Cancer of skin of chest    Carotid artery occlusion    a. s/p R CEA in 2014   Colon polyps    Hyperlipidemia    takes Pravastatin every other day   Pneumonia ~ 01/2013    Past Surgical History:  Procedure Laterality Date   CARDIAC CATHETERIZATION  06/09/1999   mild CAD (LAD, diagonal, RCA) - Dr. Rockne Menghini   CARDIOVERSION N/A 03/25/2017   Procedure: CARDIOVERSION;  Surgeon: Pixie Casino, MD;  Location: [facility] ENDOSCOPY;  Service: Cardiovascular;  Laterality: N/A;   CARDIOVERSION N/A 09/28/2020   Procedure: CARDIOVERSION;  Surgeon: [name], MD;  Location: [facility] ENDOSCOPY;  Service: Cardiovascular;  Laterality: N/A;   CAROTID DOPPLER  02/2012   60-79% RICA stenosis, 37-10% LICA stenosis (prior to carotid endarterectomy)   COLONOSCOPY     CORONARY STENT INTERVENTION N/A 05/16/2017   Procedure: Coronary  Stent Intervention;  Surgeon: Jettie Booze, MD;  Location: [place] CV LAB;  Service: Cardiovascular;  Laterality: N/A;   ENDARTERECTOMY Right 11/10/2013   Procedure: ENDARTERECTOMY CAROTID;  Surgeon: Conrad [place], MD;  Location: [place];  Service: Vascular;  Laterality: Right;   INGUINAL HERNIA REPAIR Left    LEFT HEART CATH AND CORONARY ANGIOGRAPHY N/A 05/16/2017   Procedure: Left Heart Cath and Coronary Angiography;  Surgeon: Jettie Booze, MD;  Location: [place] CV LAB;  Service: Cardiovascular;  Laterality: N/A;   MOLE REMOVAL  1990s   "chest"   NM MYOCAR PERF WALL MOTION  04/2011   bruce myoview - perfusion defect in inferior myocardium (diaphragmatic attenuation), remaining myocardium with normal perfusion, EF 67%   PATCH ANGIOPLASTY Right 11/10/2013   Procedure: PATCH ANGIOPLASTY;  Surgeon: Conrad [place], MD;  Location: [place];  Service: Vascular;  Laterality: Right;   PILONIDAL CYST DRAINAGE     SHOULDER OPEN ROTATOR CUFF REPAIR Bilateral [place]  ~ 2015   "chest"   TONSILLECTOMY      Allergies  Allergen Reactions   Lipitor [Atorvastatin Calcium] Other (See Comments)    Muscle pain   Vantin [Cefpodoxime] Rash    Current Outpatient  Medications  Medication Sig Dispense Refill   ALPHA LIPOIC ACID PO Take 250 mg by mouth daily.      apixaban (ELIQUIS) 5 MG TABS tablet TAKE (1) TABLET BY MOUTH TWICE DAILY. 60 tablet 6   b complex vitamins capsule Take 1 capsule by mouth every other day.     Cholecalciferol (VITAMIN D3) 75 MCG (3000 UT) TABS Take 3,000 Units by mouth daily.      Coenzyme Q10 (COQ10) 400 MG CAPS Take 800 mg by mouth in the morning and at bedtime.      DHEA 25 MG CAPS Take 25 mg by mouth daily.     dofetilide (TIKOSYN) 250 MCG capsule TAKE (1) CAPSULE BY MOUTH TWICE DAILY. 180 capsule 3   donepezil (ARICEPT) 5 MG tablet Take 5 mg by mouth daily.     Glucosamine-Chondroitin 500-400 MG CAPS Take 500 mg by mouth daily.       L-Arginine 1000 MG TABS Take 1,000 mg by mouth daily.     Lecithin 1200 MG CAPS Take 1,200 mg by mouth daily. Triple     Magnesium 500 MG TABS Take 500 mg by mouth daily.      metoprolol succinate (TOPROL XL) 25 MG 24 hr tablet Take 1 tablet (25 mg total) by mouth daily. 90 tablet 3   Multiple Vitamin (MULTIVITAMIN WITH MINERALS) TABS tablet Take 1 tablet by mouth daily.     nitroGLYCERIN (NITROSTAT) 0.4 MG SL tablet Place 1 tablet (0.4 mg total) under the tongue every 5 (five) minutes as needed for chest pain. 30 tablet 0   Omega 3 1200 MG CAPS Take 1,200 mg by mouth daily.     pravastatin (PRAVACHOL) 40 MG tablet TAKE 1/2 TABLET BY MOUTH EVERY MONDAY, WEDNESDAY AND FRIDAY. 36 tablet 6   selenium 200 MCG TABS tablet Take 200 mcg by mouth daily. Plus Vitamin E     sildenafil (REVATIO) 20 MG tablet TAKE 2 TO 3 TABLETS BY MOUTH AS NEEDED FOR SEXUAL ACTIVITY 60 tablet 4   zinc gluconate 50 MG tablet Take 50 mg by mouth daily.     No current facility-administered medications for this visit.    Family History  Problem Relation Age of Onset   Stroke Father    Hypertension Mother    Prostate cancer Son    Stroke Brother    Cancer Brother        spine    Social History   Socioeconomic History   Marital status: Married    Spouse name: Not on file   Number of children: 3   Years of education: Not on file   Highest education level: Not on file  Occupational History    Employer: [employer]  Tobacco Use   Smoking status: Former    Packs/day: 1.00    Years: 18.00    Pack years: 18.00    Types: Cigarettes    Quit date: 12/03/1972    Years since quitting: 49.2   Smokeless tobacco: Never  Vaping Use   Vaping Use: Never used  Substance and Sexual Activity   Alcohol use: Yes    Alcohol/week: 14.0 standard drinks    Types: 7 Glasses of wine, 7 Shots of liquor per week   Drug use: No   Sexual activity: Yes  Other Topics Concern   Not on file  Social History Narrative   Not on file    Social Determinants of Health   Financial Resource Strain: Not on file  Food Insecurity:  Not on file  Transportation Needs: Not on file  Physical Activity: Not on file  Stress: Not on file  Social Connections: Not on file  Intimate Partner Violence: Not on file     REVIEW OF SYSTEMS:  *** '[X]'$  denotes positive finding, '[ ]'$  denotes negative finding Cardiac  Comments:  Chest pain or chest pressure:    Shortness of breath upon exertion:    Short of breath when lying flat:    Irregular heart rhythm:        Vascular    Pain in calf, thigh, or hip brought on by ambulation:    Pain in feet at night that wakes you up from your sleep:     Blood clot in your veins:    Leg swelling:         Pulmonary    Oxygen at home:    Productive cough:     Wheezing:         Neurologic    Sudden weakness in arms or legs:     Sudden numbness in arms or legs:     Sudden onset of difficulty speaking or slurred speech:    Temporary loss of vision in one eye:     Problems with dizziness:         Gastrointestinal    Blood in stool:     Vomited blood:         Genitourinary    Burning when urinating:     Blood in urine:        Psychiatric    Major depression:         Hematologic    Bleeding problems:    Problems with blood clotting too easily:        Skin    Rashes or ulcers:        Constitutional    Fever or chills:      PHYSICAL EXAMINATION:  ***  General:  WDWN in NAD; vital signs documented above Gait: Not observed HENT: WNL, normocephalic Pulmonary: normal non-labored breathing Cardiac: {Desc; regular/irreg:14544} HR, {With/Without:20273} carotid bruit*** Abdomen: soft, NT; aortic pulse is *** palpable Skin: {With/Without:20273} rashes Vascular Exam/Pulses:  Right Left  Radial {Exam; arterial pulse strength 0-4:30167} {Exam; arterial pulse strength 0-4:30167}  Popliteal {Exam; arterial pulse strength 0-4:30167} {Exam; arterial pulse strength 0-4:30167}  DP {Exam;  arterial pulse strength 0-4:30167} {Exam; arterial pulse strength 0-4:30167}  PT {Exam; arterial pulse strength 0-4:30167} {Exam; arterial pulse strength 0-4:30167}   Extremities: {With/Without:20273} ischemic changes, {With/Without:20273} Gangrene , {With/Without:20273} cellulitis; {With/Without:20273} open wounds Musculoskeletal: no muscle wasting or atrophy  Neurologic: A&O X 3; moving all extremities equally; speech is fluent/normal Psychiatric:  The pt has {Desc; normal/abnormal:11317::"Normal"} affect.   Non-Invasive Vascular Imaging:   Carotid Duplex on 02/13/2022: Right:  ***% ICA stenosis Left:  ***% ICA stenosis ***  Previous Carotid duplex on 01/30/2021: Right: 1-39% ICA stenosis Left:   1-39% ICA stenosis    ASSESSMENT/PLAN:: 88 y.o. male here for follow up carotid artery stenosis and is s/p right CEA for asymptomatic carotid artery stenosis on 11/10/2013 by Dr. Bridgett Larsson.    -duplex today reveals *** -discussed s/s of stroke with pt and ***he understands should ***he develop any of these sx, ***he will go to the nearest ER or call 911. -pt will f/u in *** with carotid duplex -pt will call sooner should they have any issues. -continue statin/asa ***  Leontine Locket, [facility] [phone]  Clinic MD:  Carlis Abbott

## 2022-02-09 DIAGNOSIS — I4821 Permanent atrial fibrillation: Secondary | ICD-10-CM | POA: Diagnosis not present

## 2022-02-09 DIAGNOSIS — R413 Other amnesia: Secondary | ICD-10-CM | POA: Diagnosis not present

## 2022-02-13 ENCOUNTER — Encounter: Payer: Self-pay | Admitting: Physician Assistant

## 2022-02-13 ENCOUNTER — Ambulatory Visit (HOSPITAL_COMMUNITY)
Admission: RE | Admit: 2022-02-13 | Discharge: 2022-02-13 | Disposition: A | Discharge status: Home / Self Care | Payer: Medicare HMO | Source: Ambulatory Visit | Attending: Vascular Surgery | Admitting: Vascular Surgery

## 2022-02-13 ENCOUNTER — Other Ambulatory Visit: Payer: Self-pay

## 2022-02-13 ENCOUNTER — Ambulatory Visit: Payer: Medicare HMO | Admitting: Physician Assistant

## 2022-02-13 VITALS — BP 168/66 | HR 58 | Temp 97.9°F | Resp 18 | Ht 72.0 in | Wt 181.0 lb

## 2022-02-13 DIAGNOSIS — I6523 Occlusion and stenosis of bilateral carotid arteries: Secondary | ICD-10-CM | POA: Diagnosis not present

## 2022-02-15 ENCOUNTER — Telehealth: Payer: Self-pay | Admitting: Cardiology

## 2022-02-15 NOTE — Telephone Encounter (Signed)
Pt stated that he has never started the Metoprolol, he found it this morning. Pt stated that he hasn't had an issue with his hr being high and this is the first time it has happened, along with bp. Pt denies any cp, nausea, pressure. Pt stated that he will continue to check hr and bp daily and will start metoprolol. Will fwd to provider for recommendations/review.  ?

## 2022-02-15 NOTE — Telephone Encounter (Signed)
Sean Miranda walked in and stated his "heart rate is out of control" he checked his blood pressure and, heart rate before his morning shower. Blood pressure reading was 94/53. And heart rate was 95 bpm and it normally is in the 50 range. He is concerned and would like a call.. he has metoprolol and, he hasn't taken it had forgot he had it, and said his heart rate has been fine till this morning. TIA  ?

## 2022-02-18 NOTE — Telephone Encounter (Signed)
Can he update [place] on his symptoms since starting the toprol ? ? ?Zandra Abts MD ?

## 2022-02-20 NOTE — Telephone Encounter (Signed)
Pt stated that since starting Toprol he has been feeling great. Pt stated he has had no side effects of the medication and bp is in a normal range. Pt advised to call office regarding questions or concerns with medication. Pt voiced understanding.  ?

## 2022-02-28 DIAGNOSIS — L821 Other seborrheic keratosis: Secondary | ICD-10-CM | POA: Diagnosis not present

## 2022-02-28 DIAGNOSIS — C499 Malignant neoplasm of connective and soft tissue, unspecified: Secondary | ICD-10-CM | POA: Diagnosis not present

## 2022-02-28 DIAGNOSIS — C44722 Squamous cell carcinoma of skin of right lower limb, including hip: Secondary | ICD-10-CM | POA: Diagnosis not present

## 2022-02-28 DIAGNOSIS — D229 Melanocytic nevi, unspecified: Secondary | ICD-10-CM | POA: Diagnosis not present

## 2022-02-28 DIAGNOSIS — R202 Paresthesia of skin: Secondary | ICD-10-CM | POA: Diagnosis not present

## 2022-02-28 DIAGNOSIS — D485 Neoplasm of uncertain behavior of skin: Secondary | ICD-10-CM | POA: Diagnosis not present

## 2022-03-13 ENCOUNTER — Telehealth: Payer: Self-pay | Admitting: Cardiology

## 2022-03-13 DIAGNOSIS — R001 Bradycardia, unspecified: Secondary | ICD-10-CM | POA: Diagnosis not present

## 2022-03-13 DIAGNOSIS — I4510 Unspecified right bundle-branch block: Secondary | ICD-10-CM | POA: Diagnosis not present

## 2022-03-13 DIAGNOSIS — I480 Paroxysmal atrial fibrillation: Secondary | ICD-10-CM | POA: Diagnosis not present

## 2022-03-13 NOTE — Telephone Encounter (Signed)
Attempted to call pt. No answer no voicemail.  

## 2022-03-13 NOTE — Telephone Encounter (Signed)
Lower toprol to 12.'5mg'$  daily and update [place] on symptoms and HRs  on Thursday. If severe symptosm needs ER evaluation ? ? ?Zandra Abts MD ?

## 2022-03-13 NOTE — Telephone Encounter (Signed)
STAT if HR is under 50 or over 120 ?(normal HR is 60-100 beats per minute) ? ?What is your heart rate? 44 right now ? ?Do you have a log of your heart rate readings (document readings)? 34 this morning ? ?Do you have any other symptoms? Weak ? ?Patient states his HR has been very low ? ? ?

## 2022-03-13 NOTE — Telephone Encounter (Signed)
Pt stated that his hr is between 34-44 bpm. Pt stated that he has some SOB and weakness. Pt stated he was compliant with medications. Pt has appt with Vella Raring, PA-C at [facility] on 4/17 @ 2:20pm.  ? ?Please advise  ?

## 2022-03-14 NOTE — Telephone Encounter (Signed)
Attempted to call pt. No answer, no voicemail.  

## 2022-03-19 ENCOUNTER — Ambulatory Visit (HOSPITAL_BASED_OUTPATIENT_CLINIC_OR_DEPARTMENT_OTHER): Payer: Medicare HMO | Admitting: Family

## 2022-03-20 MED ORDER — METOPROLOL SUCCINATE ER 25 MG PO TB24: 12.5000 mg | ORAL_TABLET | Freq: Every day | ORAL | 3 refills | Status: DC

## 2022-03-20 NOTE — Telephone Encounter (Signed)
Left a message on spouse's phone (ok per DPR) to have pt call our office in regards to medication changes- Pt's phone no longer active.  ?

## 2022-03-21 NOTE — Telephone Encounter (Signed)
Patient returned call

## 2022-03-21 NOTE — Telephone Encounter (Signed)
Attempted to call pt. No answer/ No v.mail.  ?

## 2022-03-23 NOTE — Telephone Encounter (Signed)
Attempted to contact pt with no answer.  ?

## 2022-03-26 NOTE — Telephone Encounter (Signed)
Left a message for pt to call office back regarding medication /lab work  ?

## 2022-03-27 DIAGNOSIS — I4510 Unspecified right bundle-branch block: Secondary | ICD-10-CM | POA: Diagnosis not present

## 2022-03-27 DIAGNOSIS — R001 Bradycardia, unspecified: Secondary | ICD-10-CM | POA: Diagnosis not present

## 2022-04-11 ENCOUNTER — Ambulatory Visit: Payer: Medicare HMO | Admitting: Cardiology

## 2022-04-11 DIAGNOSIS — M6281 Muscle weakness (generalized): Secondary | ICD-10-CM | POA: Diagnosis not present

## 2022-04-11 DIAGNOSIS — R001 Bradycardia, unspecified: Secondary | ICD-10-CM | POA: Diagnosis not present

## 2022-04-11 NOTE — Progress Notes (Deleted)
Clinical Summary Mr. Doubek is a 88 y.o.male  seen today for follow up of the following medical problems.    1. Afib - appears diagnosed around 01/2017 during admission. Started on eliquis and lopressor 12.'5mg'$  bid. Due to continued dyspnea attempts were made to get him back into NSR, including multiple cardioversions and starting flecanide. Had tolerated lopressor at the time but stopped taking due to HRs in 40s or 50s, had been on 12.'5mg'$  bid at the time.    - was on flecanide previously, discontued after diagnosed with CAD  We tried lopressor 12.'5mg'$  bid last visit but caused fatigue and he stopped taking. Heart rates checked multiple times a day, usually 50s-60s.    - - no recent palpitations - no bleeding on eliquis.      - seen in ER with palpitations and elevated HR to 90s - EKG showed rate controlled afib   HRs at home variable, 80s to 100s. Higher than his normal of 60s at rest.  - some fluttering at times. Some fatigue.     - followed by afib clinic/EP - started on dofetilide 09/2020 with DCCV at that time - recurrent afib noted at 10/2020 f/u. Repeat DCCV was planned 11/2020 but cancelled as pt was back in SR. - at last EP appt 12/3 plan was to continue dofetilide   - no recent afib symptoms - compliant with meds. No bleeding on eliquis.     03/13/22 phone about HRs 30s-40s, we lowered toprol to 12.'5mg'$  daily.      2. CAD - admit 05/2017 with chest pain - 05/2017 cath as reported below, received a DES to LAD - 05/2017 echo LVEF 60-65%, no WMAs   - seen in ER 10/2019 with chest pain - trop neg x 1. EKG afib without acute ischemic changes. CXR no acute process    10/2019 nuclear stress no ischemia - no significant symptoms.        3. Carotid stenosis - history of right CEA - 01/2018 carotid US RICA 1-39% and patent CEA, left 1-39%. Annual checks   - followed by vascular. Has appt next month with repeat US   4. Hyperlipidemia - intolerant to statin, has  been on low dose pravastatin.  - Dr Debara Pickett had discussed repatha, however patient declinded due to concern about too low cholesterol - 05/2018 TC 195 HDL 39 TG 73 LDL 141   - taking pravastatin '40mg'$  MWF - has been resistant to repatha.     10/2019 TC 243 HDL 44 LDL 179. He was off statin at that time but has since restarted - most recent labs with pcp        5. 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 Son in Sports coach is [employer]. 2 sons and daughter are involved in family business   Past Medical History:  Diagnosis Date   Arthritis    neck    Atrial fibrillation ([place])    a. new-onset in 01/2017. Started on Eliquis   CAD (coronary artery disease), native coronary artery 05/16/2017   LAD 95%>>0 w/ 2.5 x 16 mm Synergy DES, D2 40%, RCA 50%, EF 55-65%, LVEDP mod elevated   Calcification of coronary artery    a. mild, nonobstructive CAD by cath in 2009.   Cancer of skin of chest    Carotid artery occlusion  a. s/p R CEA in 2014   Colon polyps    Hyperlipidemia    takes Pravastatin every other day   Pneumonia ~ 01/2013     Allergies  Allergen Reactions   Lipitor [Atorvastatin Calcium] Other (See Comments)    Muscle pain   Vantin [Cefpodoxime] Rash     Current Outpatient Medications  Medication Sig Dispense Refill   ALPHA LIPOIC ACID PO Take 250 mg by mouth daily.      apixaban (ELIQUIS) 5 MG TABS tablet TAKE (1) TABLET BY MOUTH TWICE DAILY. 60 tablet 6   b complex vitamins capsule Take 1 capsule by mouth every other day.     Cholecalciferol (VITAMIN D3) 75 MCG (3000 UT) TABS Take 3,000 Units by mouth daily.      Coenzyme Q10 (COQ10) 400 MG CAPS Take 800 mg by mouth in the morning and at bedtime.      DHEA 25 MG CAPS Take 25 mg by mouth daily.     dofetilide (TIKOSYN) 250 MCG capsule TAKE (1) CAPSULE BY MOUTH TWICE DAILY. 180 capsule 3    donepezil (ARICEPT) 5 MG tablet Take 5 mg by mouth daily.     Glucosamine-Chondroitin 500-400 MG CAPS Take 500 mg by mouth daily.      L-Arginine 1000 MG TABS Take 1,000 mg by mouth daily.     Lecithin 1200 MG CAPS Take 1,200 mg by mouth daily. Triple     Magnesium 500 MG TABS Take 500 mg by mouth daily.      metoprolol succinate (TOPROL XL) 25 MG 24 hr tablet Take 0.5 tablets (12.5 mg total) by mouth daily. 45 tablet 3   Multiple Vitamin (MULTIVITAMIN WITH MINERALS) TABS tablet Take 1 tablet by mouth daily.     nitroGLYCERIN (NITROSTAT) 0.4 MG SL tablet Place 1 tablet (0.4 mg total) under the tongue every 5 (five) minutes as needed for chest pain. 30 tablet 0   Omega 3 1200 MG CAPS Take 1,200 mg by mouth daily.     pravastatin (PRAVACHOL) 40 MG tablet TAKE 1/2 TABLET BY MOUTH EVERY MONDAY, WEDNESDAY AND FRIDAY. 36 tablet 6   selenium 200 MCG TABS tablet Take 200 mcg by mouth daily. Plus Vitamin E     sildenafil (REVATIO) 20 MG tablet TAKE 2 TO 3 TABLETS BY MOUTH AS NEEDED FOR SEXUAL ACTIVITY 60 tablet 4   zinc gluconate 50 MG tablet Take 50 mg by mouth daily.     No current facility-administered medications for this visit.     Past Surgical History:  Procedure Laterality Date   CARDIAC CATHETERIZATION  06/09/1999   mild CAD (LAD, diagonal, RCA) - Dr. Rockne Menghini   CARDIOVERSION N/A 03/25/2017   Procedure: CARDIOVERSION;  Surgeon: Pixie Casino, MD;  Location: [facility];  Service: Cardiovascular;  Laterality: N/A;   CARDIOVERSION N/A 09/28/2020   Procedure: CARDIOVERSION;  Surgeon: [name], MD;  Location: [facility];  Service: Cardiovascular;  Laterality: N/A;   CAROTID DOPPLER  02/2012   60-79% RICA stenosis, 46-27% LICA stenosis (prior to carotid endarterectomy)   COLONOSCOPY     CORONARY STENT INTERVENTION N/A 05/16/2017   Procedure: Coronary Stent Intervention;  Surgeon: Jettie Booze, MD;  Location: [facility];  Service: Cardiovascular;  Laterality: N/A;    ENDARTERECTOMY Right 11/10/2013   Procedure: ENDARTERECTOMY CAROTID;  Surgeon: Conrad Wadley, MD;  Location: [place] [facility];  Service: Vascular;  Laterality: Right;   INGUINAL HERNIA REPAIR Left    LEFT HEART CATH AND  CORONARY ANGIOGRAPHY N/A 05/16/2017   Procedure: Left Heart Cath and Coronary Angiography;  Surgeon: Jettie Booze, MD;  Location: [facility];  Service: Cardiovascular;  Laterality: N/A;   MOLE REMOVAL  1990s   "chest"   NM MYOCAR PERF WALL MOTION  04/2011   bruce myoview - perfusion defect in inferior myocardium (diaphragmatic attenuation), remaining myocardium with normal perfusion, EF 67%   PATCH ANGIOPLASTY Right 11/10/2013   Procedure: PATCH ANGIOPLASTY;  Surgeon: Conrad P[place] del Rio, MD;  Location: [facility];  Service: Vascular;  Laterality: Right;   PILONIDAL CYST DRAINAGE     SHOULDER OPEN ROTATOR CUFF REPAIR Bilateral 1998 - 2001   [place]  ~ 2015   "chest"   TONSILLECTOMY       Allergies  Allergen Reactions   Lipitor [Atorvastatin Calcium] Other (See Comments)    Muscle pain   Vantin [Cefpodoxime] Rash      Family History  Problem Relation Age of Onset   Stroke Father    Hypertension Mother    Prostate cancer Son    Stroke Brother    Cancer Brother        spine     Social History Mr. Sigg reports that he quit smoking about 49 years ago. His smoking use included cigarettes. He has a 18.00 pack-year smoking history. He has never used smokeless tobacco. Mr. Blackshire reports current alcohol use of about 14.0 standard drinks per week.   Review of Systems CONSTITUTIONAL: No weight loss, fever, chills, weakness or fatigue.  HEENT: Eyes: No visual loss, blurred vision, double vision or yellow sclerae.No hearing loss, sneezing, congestion, runny nose or sore throat.  SKIN: No rash or itching.  CARDIOVASCULAR:  RESPIRATORY: No shortness of breath, cough or sputum.  GASTROINTESTINAL: No anorexia, nausea, vomiting or diarrhea. No abdominal pain or  blood.  GENITOURINARY: No burning on urination, no polyuria NEUROLOGICAL: No headache, dizziness, syncope, paralysis, ataxia, numbness or tingling in the extremities. No change in bowel or bladder control.  MUSCULOSKELETAL: No muscle, back pain, joint pain or stiffness.  LYMPHATICS: No enlarged nodes. No history of splenectomy.  PSYCHIATRIC: No history of depression or anxiety.  ENDOCRINOLOGIC: No reports of sweating, cold or heat intolerance. No polyuria or polydipsia.  Marland Kitchen   Physical Examination There were no vitals filed for this visit. There were no vitals filed for this visit.  Gen: resting comfortably, no acute distress HEENT: no scleral icterus, pupils equal round and reactive, no palptable cervical adenopathy,  CV Resp: Clear to auscultation bilaterally GI: abdomen is soft, non-tender, non-distended, normal bowel sounds, no hepatosplenomegaly MSK: extremities are warm, no edema.  Skin: warm, no rash Neuro:  no focal deficits Psych: appropriate affect   Diagnostic Studies  05/2017 cath   Mid RCA lesion, 50 %stenosed. Ost RCA lesion, 25 %stenosed. Lesion noted in small RV marginal. LM lesion, 25 %stenosed. 2nd Diag lesion, 40 %stenosed. Mid LAD-1 lesion, 20 %stenosed. Mid LAD-2 lesion, 95 %stenosed. A STENT SYNERGY DES 2.5X16 drug eluting stent was successfully placed, postdilated to 3.0 mm. Post intervention, there is a 0% residual stenosis. The left ventricular systolic function is normal. LV end diastolic pressure is moderately elevated. The left ventricular ejection fraction is 55-65% by visual estimate. There is no aortic valve stenosis.   LAD was the culprit for his unstable angina. He had symptoms during the procedure at rest. These resolved after stent placement. Watch overnight. Continue IV Angiomax for another hour.   Loaded with Plavix today. We'll start  75 mg daily tomorrow. Given aspirin today as well. Would plan for aspirin, Plavix and Eliquis for 30 days.  After 30 days, would stop the aspirin and continue Plavix and Eliquis. A synergy drug-eluting stent was used since he is on long-term anticoagulation. If there is a bleeding complication, his antiplatelet therapy could be stopped sooner than usual. Ideally, he would get 6-12 months of Plavix depending on his bleeding risk profile.   Possible discharge tomorrow if he does well tonight and with cardiac rehabilitation tomorrow.     05/2017 echo Study Conclusions   - Left ventricle: The cavity size was normal. Wall thickness was   increased in a pattern of mild LVH. Systolic function was normal.   The estimated ejection fraction was in the range of 60% to 65%.   Wall motion was normal; there were no regional wall motion   abnormalities. The study is not technically sufficient to allow   evaluation of LV diastolic function. - Aortic valve: Mildly calcified annulus. Trileaflet; moderately   thickened leaflets. Valve area (VTI): 2.98 cm^2. Valve area   (Vmax): 2.7 cm^2. Valve area (Vmean): 2.56 cm^2. - Left atrium: The atrium was mildly dilated. - Technically adequate study.     10/2019 nuclear stress Unable to achieve adequate heart rate response with GXT, Lexiscan utilized. No diagnostic ST segment changes. Small, mild intensity, inferior/inferoseptal defect extending from apex to base that is fixed (actually more prominent at rest) and consistent with soft tissue attenuation. No reversible defects to indicate ischemia. This is a low risk study. Nuclear stress EF: 69%.           Assessment and Plan  1. Afib - doing well on dofetilide, continue. - continue anticoag   2. CAD - no significant symptoms, relatively recent stress test was benign.  - continue to monitor   3. Hyperlipidemia - limited in statin tolerance - request labs from pcp      Arnoldo Lenis, M.D., F.A.C.C.

## 2022-04-13 DIAGNOSIS — D0471 Carcinoma in situ of skin of right lower limb, including hip: Secondary | ICD-10-CM | POA: Diagnosis not present

## 2022-04-13 DIAGNOSIS — I872 Venous insufficiency (chronic) (peripheral): Secondary | ICD-10-CM | POA: Diagnosis not present

## 2022-05-10 DIAGNOSIS — M6281 Muscle weakness (generalized): Secondary | ICD-10-CM | POA: Diagnosis not present

## 2022-05-10 DIAGNOSIS — R413 Other amnesia: Secondary | ICD-10-CM | POA: Diagnosis not present

## 2022-05-10 DIAGNOSIS — E785 Hyperlipidemia, unspecified: Secondary | ICD-10-CM | POA: Diagnosis not present

## 2022-05-10 DIAGNOSIS — I700 Atherosclerosis of aorta: Secondary | ICD-10-CM | POA: Diagnosis not present

## 2022-05-10 DIAGNOSIS — Z79899 Other long term (current) drug therapy: Secondary | ICD-10-CM | POA: Diagnosis not present

## 2022-05-10 DIAGNOSIS — R001 Bradycardia, unspecified: Secondary | ICD-10-CM | POA: Diagnosis not present

## 2022-05-21 DIAGNOSIS — I480 Paroxysmal atrial fibrillation: Secondary | ICD-10-CM | POA: Diagnosis not present

## 2022-05-21 DIAGNOSIS — E785 Hyperlipidemia, unspecified: Secondary | ICD-10-CM | POA: Diagnosis not present

## 2022-05-21 DIAGNOSIS — R413 Other amnesia: Secondary | ICD-10-CM | POA: Diagnosis not present

## 2022-05-21 DIAGNOSIS — E871 Hypo-osmolality and hyponatremia: Secondary | ICD-10-CM | POA: Diagnosis not present

## 2022-06-07 DIAGNOSIS — H5200 Hypermetropia, unspecified eye: Secondary | ICD-10-CM | POA: Diagnosis not present

## 2022-07-08 ENCOUNTER — Encounter (HOSPITAL_COMMUNITY): Payer: Self-pay

## 2022-07-08 ENCOUNTER — Emergency Department (HOSPITAL_COMMUNITY): Payer: Medicare HMO

## 2022-07-08 ENCOUNTER — Inpatient Hospital Stay (HOSPITAL_COMMUNITY)
Admission: EM | Admit: 2022-07-08 | Discharge: 2022-07-10 | DRG: 247 | Disposition: A | Discharge status: Home / Self Care | Payer: Medicare HMO | Attending: Cardiovascular Disease | Admitting: Cardiovascular Disease

## 2022-07-08 DIAGNOSIS — R001 Bradycardia, unspecified: Secondary | ICD-10-CM | POA: Diagnosis not present

## 2022-07-08 DIAGNOSIS — Z79899 Other long term (current) drug therapy: Secondary | ICD-10-CM | POA: Diagnosis not present

## 2022-07-08 DIAGNOSIS — I4819 Other persistent atrial fibrillation: Secondary | ICD-10-CM | POA: Diagnosis present

## 2022-07-08 DIAGNOSIS — Z8679 Personal history of other diseases of the circulatory system: Secondary | ICD-10-CM | POA: Diagnosis not present

## 2022-07-08 DIAGNOSIS — I10 Essential (primary) hypertension: Secondary | ICD-10-CM | POA: Diagnosis present

## 2022-07-08 DIAGNOSIS — I25110 Atherosclerotic heart disease of native coronary artery with unstable angina pectoris: Secondary | ICD-10-CM | POA: Diagnosis not present

## 2022-07-08 DIAGNOSIS — I200 Unstable angina: Secondary | ICD-10-CM | POA: Diagnosis not present

## 2022-07-08 DIAGNOSIS — Z8249 Family history of ischemic heart disease and other diseases of the circulatory system: Secondary | ICD-10-CM

## 2022-07-08 DIAGNOSIS — R079 Chest pain, unspecified: Principal | ICD-10-CM

## 2022-07-08 DIAGNOSIS — I248 Other forms of acute ischemic heart disease: Secondary | ICD-10-CM | POA: Diagnosis not present

## 2022-07-08 DIAGNOSIS — Z7901 Long term (current) use of anticoagulants: Secondary | ICD-10-CM | POA: Diagnosis not present

## 2022-07-08 DIAGNOSIS — I4811 Longstanding persistent atrial fibrillation: Secondary | ICD-10-CM

## 2022-07-08 DIAGNOSIS — M25562 Pain in left knee: Secondary | ICD-10-CM | POA: Diagnosis not present

## 2022-07-08 DIAGNOSIS — Z87891 Personal history of nicotine dependence: Secondary | ICD-10-CM | POA: Diagnosis not present

## 2022-07-08 DIAGNOSIS — Z8673 Personal history of transient ischemic attack (TIA), and cerebral infarction without residual deficits: Secondary | ICD-10-CM

## 2022-07-08 DIAGNOSIS — Z8042 Family history of malignant neoplasm of prostate: Secondary | ICD-10-CM

## 2022-07-08 DIAGNOSIS — I2510 Atherosclerotic heart disease of native coronary artery without angina pectoris: Secondary | ICD-10-CM

## 2022-07-08 DIAGNOSIS — I2584 Coronary atherosclerosis due to calcified coronary lesion: Secondary | ICD-10-CM | POA: Diagnosis not present

## 2022-07-08 DIAGNOSIS — E785 Hyperlipidemia, unspecified: Secondary | ICD-10-CM | POA: Diagnosis present

## 2022-07-08 DIAGNOSIS — Z9889 Other specified postprocedural states: Secondary | ICD-10-CM

## 2022-07-08 DIAGNOSIS — Z888 Allergy status to other drugs, medicaments and biological substances status: Secondary | ICD-10-CM | POA: Diagnosis not present

## 2022-07-08 DIAGNOSIS — I4510 Unspecified right bundle-branch block: Secondary | ICD-10-CM | POA: Diagnosis present

## 2022-07-08 DIAGNOSIS — Z8719 Personal history of other diseases of the digestive system: Secondary | ICD-10-CM | POA: Diagnosis not present

## 2022-07-08 DIAGNOSIS — Z955 Presence of coronary angioplasty implant and graft: Secondary | ICD-10-CM

## 2022-07-08 DIAGNOSIS — I779 Disorder of arteries and arterioles, unspecified: Secondary | ICD-10-CM | POA: Diagnosis present

## 2022-07-08 DIAGNOSIS — I4891 Unspecified atrial fibrillation: Secondary | ICD-10-CM | POA: Diagnosis present

## 2022-07-08 LAB — CBC WITH DIFFERENTIAL/PLATELET
Abs Immature Granulocytes: 0.01 10*3/uL (ref 0.00–0.07)
Basophils Absolute: 0 10*3/uL (ref 0.0–0.1)
Basophils Relative: 0 %
Eosinophils Absolute: 0.4 10*3/uL (ref 0.0–0.5)
Eosinophils Relative: 6 %
HCT: 36.9 % — ABNORMAL LOW (ref 39.0–52.0)
Hemoglobin: 13 g/dL (ref 13.0–17.0)
Immature Granulocytes: 0 %
Lymphocytes Relative: 33 %
Lymphs Abs: 2.1 10*3/uL (ref 0.7–4.0)
MCH: 32.5 pg (ref 26.0–34.0)
MCHC: 35.2 g/dL (ref 30.0–36.0)
MCV: 92.3 fL (ref 80.0–100.0)
Monocytes Absolute: 0.6 10*3/uL (ref 0.1–1.0)
Monocytes Relative: 10 %
Neutro Abs: 3.2 10*3/uL (ref 1.7–7.7)
Neutrophils Relative %: 51 %
Platelets: 187 10*3/uL (ref 150–400)
RBC: 4 MIL/uL — ABNORMAL LOW (ref 4.22–5.81)
RDW: 13.3 % (ref 11.5–15.5)
WBC: 6.3 10*3/uL (ref 4.0–10.5)
nRBC: 0 % (ref 0.0–0.2)

## 2022-07-08 LAB — COMPREHENSIVE METABOLIC PANEL
ALT: 16 U/L (ref 0–44)
AST: 20 U/L (ref 15–41)
Albumin: 3.7 g/dL (ref 3.5–5.0)
Alkaline Phosphatase: 64 U/L (ref 38–126)
Anion gap: 3 — ABNORMAL LOW (ref 5–15)
BUN: 20 mg/dL (ref 8–23)
CO2: 25 mmol/L (ref 22–32)
Calcium: 8.7 mg/dL — ABNORMAL LOW (ref 8.9–10.3)
Chloride: 102 mmol/L (ref 98–111)
Creatinine, Ser: 1.22 mg/dL (ref 0.61–1.24)
GFR, Estimated: 56 mL/min — ABNORMAL LOW (ref >60)
Glucose, Bld: 120 mg/dL — ABNORMAL HIGH (ref 70–99)
Potassium: 3.9 mmol/L (ref 3.5–5.1)
Sodium: 130 mmol/L — ABNORMAL LOW (ref 135–145)
Total Bilirubin: 0.5 mg/dL (ref 0.3–1.2)
Total Protein: 6.9 g/dL (ref 6.5–8.1)

## 2022-07-08 LAB — TROPONIN I (HIGH SENSITIVITY)
Troponin I (High Sensitivity): 5 ng/L (ref <18)
Troponin I (High Sensitivity): 16 ng/L (ref <18)

## 2022-07-08 LAB — HEPARIN LEVEL (UNFRACTIONATED): Heparin Unfractionated: 0.53 IU/mL (ref 0.30–0.70)

## 2022-07-08 LAB — APTT: aPTT: 29 seconds (ref 24–36)

## 2022-07-08 MED ORDER — NITROGLYCERIN 0.4 MG SL SUBL
0.4000 mg | SUBLINGUAL_TABLET | Freq: Once | SUBLINGUAL | Status: AC
Start: 2022-07-08 — End: 2022-07-08
  Administered 2022-07-08: 0.4 mg via SUBLINGUAL

## 2022-07-08 MED ORDER — NITROGLYCERIN 0.4 MG SL SUBL: 0.4000 mg | SUBLINGUAL_TABLET | SUBLINGUAL | Status: DC | PRN

## 2022-07-08 MED ORDER — NITROGLYCERIN 0.4 MG SL SUBL
0.4000 mg | SUBLINGUAL_TABLET | Freq: Once | SUBLINGUAL | Status: AC
  Administered 2022-07-08: 0.4 mg via SUBLINGUAL
  Filled 2022-07-08: qty 1

## 2022-07-08 MED ORDER — ASPIRIN 81 MG PO CHEW
324.0000 mg | CHEWABLE_TABLET | Freq: Once | ORAL | Status: AC
  Administered 2022-07-08: 324 mg via ORAL
  Filled 2022-07-08: qty 4

## 2022-07-08 MED ORDER — HEPARIN (PORCINE) 25000 UT/250ML-% IV SOLN
1150.0000 [IU]/h | INTRAVENOUS | Status: DC
  Administered 2022-07-08: 1000 [IU]/h via INTRAVENOUS
  Filled 2022-07-08: qty 250

## 2022-07-08 NOTE — ED Triage Notes (Signed)
Left sided chest pain x 15 minutes, started at 1800. Denies SHOB, diaphoresis, nausea, vomiting. Pt took an additional metoprolol at 1805.

## 2022-07-08 NOTE — Progress Notes (Signed)
ANTICOAGULATION CONSULT NOTE - Initial Consult  Pharmacy Consult for heparin Indication: chest pain/ACS  Allergies  Allergen Reactions   Lipitor [Atorvastatin Calcium] Other (See Comments)    Muscle pain   Vantin [Cefpodoxime] Rash    Patient Measurements: Height: 6' (182.9 cm) Weight: 83 kg (183 lb) IBW/kg (Calculated) : 77.6 Heparin Dosing Weight: 83 kg  Vital Signs: Temp: 98.2 F (36.8 C) (08/06 1901) Temp Source: Oral (08/06 1901) BP: 121/60 (08/06 1940) Pulse Rate: 61 (08/06 1940)  Labs: Recent Labs    07/08/22 1906  HGB 13.0  HCT 36.9*  PLT 187  CREATININE 1.22  TROPONINIHS 5    Estimated Creatinine Clearance: 44.2 mL/min (by C-G formula based on SCr of 1.22 mg/dL).   Medical History: Past Medical History:  Diagnosis Date   Arthritis    neck    Atrial fibrillation ([place])    a. new-onset in 01/2017. Started on Eliquis   CAD (coronary artery disease), native coronary artery 05/16/2017   LAD 95%>>0 w/ 2.5 x 16 mm Synergy DES, D2 40%, RCA 50%, EF 55-65%, LVEDP mod elevated   Calcification of coronary artery    a. mild, nonobstructive CAD by cath in 2009.   Cancer of skin of chest    Carotid artery occlusion    a. s/p R CEA in 2014   Colon polyps    Hyperlipidemia    takes Pravastatin every other day   Pneumonia ~ 01/2013    Medications:  (Not in a hospital admission)   Assessment: Pharmacy consulted to dose heparin in patient with chest pain/ACS.  Patient is on apixaban prior to admission with last dose 8/6 @ 0830.  Will need to dose based on aPTT until correlation with heparin levels.  CBC WNL  Goal of Therapy:  Heparin level 0.3-0.7 units/ml aPTT 66-102 seconds Monitor platelets by anticoagulation protocol: Yes   Plan:  Start heparin infusion at 1000 units/hr. Heparin level in 8 hours and daily. Monitor H&H and platelets.   Margot Ables, PharmD Clinical Pharmacist 07/08/2022 8:06 PM

## 2022-07-08 NOTE — ED Provider Notes (Addendum)
[facility] EMERGENCY DEPARTMENT Provider Note   CSN: [id number] Arrival date & time: 07/08/22  1810     History  Chief Complaint  Patient presents with   Chest Pain    Sean Miranda is a 88 y.o. male.  Patient with history of hyperlipidemia, CAD, afib on Elliquis, hypertension presents today with complaints of chest pain. States that around 6 pm this evening he was gathering the trash bins together in his house in preparation to take out the garbage.  States that he had sudden onset development of crushing chest pain that was left-sided in nature and did not radiate.  He denies any associated shortness of breath, diaphoresis, nausea, or vomiting.  States that the pain was very severe for about 15 minutes and he immediately came to the hospital for evaluation of same. Currently he states that his pain is improved but still present. It is not pleuritic in nature or reproducible to palpation.  States that he takes his Eliquis every day with no missed doses for his atrial fibrillation.  States that he is overall very active and works out every day and has a notably low heart rate that is normal for him.  Denies any history of heart problems other than his atrial fibrillation.  States that he does not smoke, did smoke 1 pack a day for some time but stopped in 1974.  Additionally, of note, does state that he fell out of bed many days ago and is having lingering left knee pain.  He has been able to walk since without difficulty.  The history is provided by the patient. No language interpreter was used.  Chest Pain      Home Medications Prior to Admission medications   Medication Sig Start Date End Date Taking? Authorizing Provider  ALPHA LIPOIC ACID PO Take 250 mg by mouth daily.     [provider]  apixaban (ELIQUIS) 5 MG TABS tablet TAKE (1) TABLET BY MOUTH TWICE DAILY. 01/01/22   Evans Lance, MD  b complex vitamins capsule Take 1 capsule by mouth every other day.    [provider]  Cholecalciferol (VITAMIN D3) 75 MCG (3000 UT) TABS Take 3,000 Units by mouth daily.     [provider]  Coenzyme Q10 (COQ10) 400 MG CAPS Take 800 mg by mouth in the morning and at bedtime.     [provider]  DHEA 25 MG CAPS Take 25 mg by mouth daily.    [provider]  dofetilide (TIKOSYN) 250 MCG capsule TAKE (1) CAPSULE BY MOUTH TWICE DAILY. 06/21/21   Evans Lance, MD  donepezil (ARICEPT) 5 MG tablet Take 5 mg by mouth daily. 11/01/21   [provider]  Glucosamine-Chondroitin 500-400 MG CAPS Take 500 mg by mouth daily.     [provider]  L-Arginine 1000 MG TABS Take 1,000 mg by mouth daily.    [provider]  Lecithin 1200 MG CAPS Take 1,200 mg by mouth daily. Triple    [provider]  Magnesium 500 MG TABS Take 500 mg by mouth daily.     [provider]  metoprolol succinate (TOPROL XL) 25 MG 24 hr tablet Take 0.5 tablets (12.5 mg total) by mouth daily. 03/20/22   Arnoldo Lenis, MD  Multiple Vitamin (MULTIVITAMIN WITH MINERALS) TABS tablet Take 1 tablet by mouth daily.    [provider]  nitroGLYCERIN (NITROSTAT) 0.4 MG SL tablet Place 1 tablet (0.4 mg total) under the  tongue every 5 (five) minutes as needed for chest pain. 10/13/19   Ripley Fraise, MD  Omega 3 1200 MG CAPS Take 1,200 mg by mouth daily.    [provider]  pravastatin (PRAVACHOL) 40 MG tablet TAKE 1/2 TABLET BY MOUTH EVERY MONDAY, WEDNESDAY AND FRIDAY. 07/17/21   Arnoldo Lenis, MD  selenium 200 MCG TABS tablet Take 200 mcg by mouth daily. Plus Vitamin E    [provider]  sildenafil (REVATIO) 20 MG tablet TAKE 2 TO 3 TABLETS BY MOUTH AS NEEDED FOR SEXUAL ACTIVITY 02/07/18   Marletta Lor, MD  zinc gluconate 50 MG tablet Take 50 mg by mouth daily.    [provider]      Allergies    Lipitor [atorvastatin calcium] and Vantin [cefpodoxime]    Review of Systems   Review of  Systems  Cardiovascular:  Positive for chest pain.  All other systems reviewed and are negative.   Physical Exam Updated Vital Signs BP 121/60   Pulse 61   Temp 98.2 F (36.8 C) (Oral)   Resp 17   Ht 6' (1.829 m)   Wt 83 kg   SpO2 93%   BMI 24.82 kg/m  Physical Exam Vitals and nursing note reviewed.  Constitutional:      General: He is not in acute distress.    Appearance: Normal appearance. He is normal weight. He is not ill-appearing, toxic-appearing or diaphoretic.  HENT:     Head: Normocephalic and atraumatic.  Neck:     Vascular: No JVD.  Cardiovascular:     Rate and Rhythm: Regular rhythm. Bradycardia present.     Pulses:          Radial pulses are 2+ on the right side and 2+ on the left side.       Dorsalis pedis pulses are 2+ on the right side and 2+ on the left side.       Posterior tibial pulses are 2+ on the right side and 2+ on the left side.     Heart sounds: Normal heart sounds.  Pulmonary:     Effort: Pulmonary effort is normal. No respiratory distress.  Chest:     Chest wall: No tenderness.  Abdominal:     Palpations: Abdomen is soft.  Musculoskeletal:        General: Normal range of motion.     Cervical back: Normal range of motion.     Right lower leg: No tenderness. No edema.     Left lower leg: No tenderness. No edema.     Comments: Healing superficial abrasion noted over the patella of the left knee with some associated tenderness to palpation.  No obvious deformity.  Skin:    General: Skin is warm and dry.  Neurological:     General: No focal deficit present.     Mental Status: He is alert.  Psychiatric:        Mood and Affect: Mood normal.        Behavior: Behavior normal.     ED Results / Procedures / Treatments   Labs (all labs ordered are listed, but only abnormal results are displayed) Labs Reviewed  COMPREHENSIVE METABOLIC PANEL - Abnormal; Notable for the following components:      Result Value   Sodium 130 (*)    Glucose,  Bld 120 (*)    Calcium 8.7 (*)    GFR, Estimated 56 (*)    Anion gap 3 (*)    All  other components within normal limits  CBC WITH DIFFERENTIAL/PLATELET - Abnormal; Notable for the following components:   RBC 4.00 (*)    HCT 36.9 (*)    All other components within normal limits  HEPARIN LEVEL (UNFRACTIONATED)  APTT  HEPARIN LEVEL (UNFRACTIONATED)  APTT  CBC  TROPONIN I (HIGH SENSITIVITY)  TROPONIN I (HIGH SENSITIVITY)    EKG EKG Interpretation  Date/Time:  Sunday July 08 2022 19:25:28 EDT Ventricular Rate:  68 PR Interval:  225 QRS Duration: 160 QT Interval:  476 QTC Calculation: 507 R Axis:   92 Text Interpretation: Sinus rhythm Prolonged PR interval Consider left atrial enlargement RBBB and LPFB Repol abnrm suggests ischemia, diffuse leads worsening ischemic changes as compared to prior EKG Confirmed by Malvin Johns [phone]) on 07/08/2022 7:30:17 PM  Radiology DG Knee Complete 4 Views Left  Result Date: 07/08/2022 CLINICAL DATA:  Fall, left knee pain EXAM: LEFT KNEE - COMPLETE 4+ VIEW COMPARISON:  None Available. FINDINGS: No definite evidence of fracture, dislocation, or joint effusion. Corticated appearing probable enthesopathic change or heterotopic ossification of the posterior aspect of the left fibular neck. Minimal tricompartmental arthrosis. Soft tissues are unremarkable. IMPRESSION: 1. No acute fracture or dislocation of the left knee. Minimal arthrosis. 2. Corticated appearing probable enthesopathic change or heterotopic ossification of the posterior left aspect of the fibular neck. Consider additional if there is acute point tenderness or other concern for fibular neck fracture. Electronically Signed   By: Delanna Ahmadi M.D.   On: 07/08/2022 19:03   DG Chest 2 View  Result Date: 07/08/2022 CLINICAL DATA:  Left-sided chest pain EXAM: CHEST - 2 VIEW COMPARISON:  07/18/2020 FINDINGS: The heart size and mediastinal contours are within normal limits. Both lungs are clear.  The visualized skeletal structures are unremarkable. IMPRESSION: No acute abnormality of the lungs. Electronically Signed   By: Delanna Ahmadi M.D.   On: 07/08/2022 19:01    Procedures .Critical Care  Performed by: Bud Face, PA-C Authorized by: Bud Face, PA-C   Critical care provider statement:    Critical care time (minutes):  35   Critical care start time:  07/08/2022 8:00 PM   Critical care end time:  07/08/2022 8:35 PM   Critical care was necessary to treat or prevent imminent or life-threatening deterioration of the following conditions:  Cardiac failure   Critical care was time spent personally by me on the following activities:  Development of treatment plan with patient or surrogate, discussions with consultants, discussions with primary provider, evaluation of patient's response to treatment, examination of patient, obtaining history from patient or surrogate, ordering and review of laboratory studies, ordering and review of radiographic studies, pulse oximetry, re-evaluation of patient's condition and review of old charts   Care discussed with: admitting provider       Medications Ordered in ED Medications  nitroGLYCERIN (NITROSTAT) SL tablet 0.4 mg (has no administration in time range)  nitroGLYCERIN (NITROSTAT) SL tablet 0.4 mg (0.4 mg Sublingual Given 07/08/22 1929)  aspirin chewable tablet 324 mg (324 mg Oral Given 07/08/22 1929)  nitroGLYCERIN (NITROSTAT) SL tablet 0.4 mg (0.4 mg Sublingual Given 07/08/22 1940)    ED Course/ Medical Decision Making/ A&P                           Medical Decision Making Amount and/or Complexity of Data Reviewed Labs: ordered. Radiology: ordered.  Risk OTC drugs. Prescription drug management. Decision regarding hospitalization.   This patient  presents to the ED for concern of chest pain, this involves an extensive number of treatment options, and is a complaint that carries with it a high risk of complications and morbidity.  The  differential diagnosis includes ACS   Co morbidities that complicate the patient evaluation  Hx afib on Elliquis   Additional history obtained:  Additional history obtained from epic chart review   Lab Tests:  I Ordered, and personally interpreted labs.  The pertinent results include:  first troponin 5, Na 130 unchanged from previous. No other acute laboratory findings   Imaging Studies ordered:  I ordered imaging studies including CXR, DG left knee I independently visualized and interpreted imaging which showed  Chest: No acute findings Left knee:  1. No acute fracture or dislocation of the left knee. Minimal arthrosis.  2. Corticated appearing probable enthesopathic change or heterotopic ossification of the posterior left aspect of the fibular neck. Consider additional if there is acute point tenderness or other concern for fibular neck fracture. I agree with the radiologist interpretation   Cardiac Monitoring: / EKG:  The patient was maintained on a cardiac monitor.  I personally viewed and interpreted the cardiac monitored which showed an underlying rhythm of: ST depression, concern for ischemic changes. No STEMI   Consultations Obtained:  I requested consultation with the STEMI on call,  and discussed lab and imaging findings as well as pertinent plan - they recommend: Dr. Gwenlyn Found reviewed EKG and deemed it did not meet STEMI criteria   Problem List / ED Course / Critical interventions / Medication management  I ordered medication including ASA, nitro, and heparin  for chest pain, ACS  Reevaluation of the patient after these medicines showed that the patient resolved I have reviewed the patients home medicines and have made adjustments as needed  Test / Admission - Considered:  Patient presents today with complaints of chest pain since 6 PM today.  Pain initially severe in nature and lasted for 15 minutes before spontaneously improving.  Initial evaluation, patient  was not diaphoretic or nauseous and stated that his pain was minimal.  However, after about 30 minutes of the patient being here I was informed by nursing staff that his pain was severe again.  I then ordered aspirin and nitroglycerin and after 2 rounds of nitro his pain completely resolved.  At that time a subsequent EKG was captured that showed worsening ST depression and ischemic changes, therefore STEMI on-call was consulted and they advised no STEMI.  First troponin obtained which was negative, however given that only about 30 minutes have passed since initiation of his pain, I would not expect troponin to be elevated yet.  Given these worsening ischemic changes, patient will need to be admitted to cardiology and transferred to [facility] for management. Discussed this with the patient who is understanding and in agreement.  Discussed patient with cardiology who accepts patient for admission to [facility].    This is a shared visit with supervising physician Dr. Tamera Punt who has independently evaluated patient & provided guidance in evaluation/management/disposition, in agreement with care    Final Clinical Impression(s) / ED Diagnoses Final diagnoses:  Chest pain, unspecified type  Unstable angina [facility])    Rx / DC Orders ED Discharge Orders     None         Nestor Lewandowsky 07/08/22 2102    Malvin Johns, MD 07/08/22 2316    Karan Ramnauth, Leary Roca, PA-C 09/20/22 2308    Malvin Johns,  MD 09/21/22 [id number]

## 2022-07-09 ENCOUNTER — Encounter (HOSPITAL_COMMUNITY)
Admission: EM | Disposition: A | Discharge status: Home / Self Care | Payer: Self-pay | Source: Home / Self Care | Attending: Cardiovascular Disease

## 2022-07-09 ENCOUNTER — Other Ambulatory Visit (HOSPITAL_COMMUNITY): Payer: Medicare HMO

## 2022-07-09 ENCOUNTER — Other Ambulatory Visit: Payer: Self-pay

## 2022-07-09 DIAGNOSIS — I25110 Atherosclerotic heart disease of native coronary artery with unstable angina pectoris: Secondary | ICD-10-CM | POA: Diagnosis not present

## 2022-07-09 DIAGNOSIS — I200 Unstable angina: Secondary | ICD-10-CM | POA: Diagnosis not present

## 2022-07-09 HISTORY — PX: LEFT HEART CATH AND CORONARY ANGIOGRAPHY: CATH118249

## 2022-07-09 HISTORY — PX: CORONARY ATHERECTOMY: CATH118238

## 2022-07-09 HISTORY — PX: INTRAVASCULAR ULTRASOUND/IVUS: CATH118244

## 2022-07-09 HISTORY — PX: INTRAVASCULAR PRESSURE WIRE/FFR STUDY: CATH118243

## 2022-07-09 HISTORY — PX: CORONARY STENT INTERVENTION: CATH118234

## 2022-07-09 HISTORY — PX: TEMPORARY PACEMAKER: CATH118268

## 2022-07-09 LAB — BASIC METABOLIC PANEL
Anion gap: 5 (ref 5–15)
BUN: 15 mg/dL (ref 8–23)
CO2: 26 mmol/L (ref 22–32)
Calcium: 8.8 mg/dL — ABNORMAL LOW (ref 8.9–10.3)
Chloride: 104 mmol/L (ref 98–111)
Creatinine, Ser: 1.08 mg/dL (ref 0.61–1.24)
GFR, Estimated: >60 mL/min (ref >60)
Glucose, Bld: 95 mg/dL (ref 70–99)
Potassium: 4.2 mmol/L (ref 3.5–5.1)
Sodium: 135 mmol/L (ref 135–145)

## 2022-07-09 LAB — CBC
HCT: 36.4 % — ABNORMAL LOW (ref 39.0–52.0)
Hemoglobin: 12.7 g/dL — ABNORMAL LOW (ref 13.0–17.0)
MCH: 31.9 pg (ref 26.0–34.0)
MCHC: 34.9 g/dL (ref 30.0–36.0)
MCV: 91.5 fL (ref 80.0–100.0)
Platelets: 192 10*3/uL (ref 150–400)
RBC: 3.98 MIL/uL — ABNORMAL LOW (ref 4.22–5.81)
RDW: 13.2 % (ref 11.5–15.5)
WBC: 6.1 10*3/uL (ref 4.0–10.5)
nRBC: 0 % (ref 0.0–0.2)

## 2022-07-09 LAB — HEPATIC FUNCTION PANEL
ALT: 15 U/L (ref 0–44)
AST: 18 U/L (ref 15–41)
Albumin: 3.3 g/dL — ABNORMAL LOW (ref 3.5–5.0)
Alkaline Phosphatase: 51 U/L (ref 38–126)
Bilirubin, Direct: 0.2 mg/dL (ref 0.0–0.2)
Indirect Bilirubin: 0.8 mg/dL (ref 0.3–0.9)
Total Bilirubin: 1 mg/dL (ref 0.3–1.2)
Total Protein: 6.3 g/dL — ABNORMAL LOW (ref 6.5–8.1)

## 2022-07-09 LAB — APTT: aPTT: 55 seconds — ABNORMAL HIGH (ref 24–36)

## 2022-07-09 LAB — HEMOGLOBIN A1C
Hgb A1c MFr Bld: 5.1 % (ref 4.8–5.6)
Mean Plasma Glucose: 99.67 mg/dL

## 2022-07-09 LAB — HEPARIN LEVEL (UNFRACTIONATED): Heparin Unfractionated: 0.41 IU/mL (ref 0.30–0.70)

## 2022-07-09 LAB — PROTIME-INR
INR: 1.1 (ref 0.8–1.2)
Prothrombin Time: 14.2 seconds (ref 11.4–15.2)

## 2022-07-09 LAB — MAGNESIUM: Magnesium: 2.2 mg/dL (ref 1.7–2.4)

## 2022-07-09 LAB — LIPID PANEL
Cholesterol: 201 mg/dL — ABNORMAL HIGH (ref 0–200)
HDL: 38 mg/dL — ABNORMAL LOW (ref >40)
LDL Cholesterol: 151 mg/dL — ABNORMAL HIGH (ref 0–99)
Total CHOL/HDL Ratio: 5.3 RATIO
Triglycerides: 59 mg/dL (ref <150)
VLDL: 12 mg/dL (ref 0–40)

## 2022-07-09 LAB — POCT ACTIVATED CLOTTING TIME
Activated Clotting Time: 257 seconds
Activated Clotting Time: 293 seconds
Activated Clotting Time: 311 seconds

## 2022-07-09 LAB — BRAIN NATRIURETIC PEPTIDE: B Natriuretic Peptide: 354.9 pg/mL — ABNORMAL HIGH (ref 0.0–100.0)

## 2022-07-09 SURGERY — LEFT HEART CATH AND CORONARY ANGIOGRAPHY
Anesthesia: LOCAL

## 2022-07-09 MED ORDER — NITROGLYCERIN 1 MG/10 ML FOR IR/CATH LAB
INTRA_ARTERIAL | Status: DC | PRN
  Administered 2022-07-09: 200 ug via INTRACORONARY

## 2022-07-09 MED ORDER — CLOPIDOGREL BISULFATE 75 MG PO TABS
75.0000 mg | ORAL_TABLET | Freq: Every day | ORAL | Status: DC
  Administered 2022-07-10: 75 mg via ORAL
  Filled 2022-07-09: qty 1

## 2022-07-09 MED ORDER — HEPARIN SODIUM (PORCINE) 1000 UNIT/ML IJ SOLN
INTRAMUSCULAR | Status: DC | PRN
  Administered 2022-07-09 (×2): 4000 [IU] via INTRAVENOUS
  Administered 2022-07-09: 5000 [IU] via INTRAVENOUS

## 2022-07-09 MED ORDER — SODIUM CHLORIDE 0.9 % WEIGHT BASED INFUSION
3.0000 mL/kg/h | INTRAVENOUS | Status: DC
  Administered 2022-07-09: 3 mL/kg/h via INTRAVENOUS

## 2022-07-09 MED ORDER — MIDAZOLAM HCL 2 MG/2ML IJ SOLN
INTRAMUSCULAR | Status: AC
  Filled 2022-07-09: qty 2

## 2022-07-09 MED ORDER — FENTANYL CITRATE (PF) 100 MCG/2ML IJ SOLN
INTRAMUSCULAR | Status: AC
  Filled 2022-07-09: qty 2

## 2022-07-09 MED ORDER — VERAPAMIL HCL 2.5 MG/ML IV SOLN
INTRAVENOUS | Status: AC
  Filled 2022-07-09: qty 2

## 2022-07-09 MED ORDER — VERAPAMIL HCL 2.5 MG/ML IV SOLN
INTRAVENOUS | Status: DC | PRN
  Administered 2022-07-09: 10 mL via INTRA_ARTERIAL

## 2022-07-09 MED ORDER — DOFETILIDE 250 MCG PO CAPS
250.0000 ug | ORAL_CAPSULE | Freq: Two times a day (BID) | ORAL | Status: DC
  Administered 2022-07-09 – 2022-07-10 (×3): 250 ug via ORAL
  Filled 2022-07-09 (×3): qty 1

## 2022-07-09 MED ORDER — SODIUM CHLORIDE 0.9 % WEIGHT BASED INFUSION
1.0000 mL/kg/h | INTRAVENOUS | Status: AC
  Administered 2022-07-09: 1 mL/kg/h via INTRAVENOUS

## 2022-07-09 MED ORDER — ASPIRIN 81 MG PO CHEW
81.0000 mg | CHEWABLE_TABLET | ORAL | Status: AC
  Administered 2022-07-09: 81 mg via ORAL
  Filled 2022-07-09: qty 1

## 2022-07-09 MED ORDER — SODIUM CHLORIDE 0.9% FLUSH: 3.0000 mL | INTRAVENOUS | Status: DC | PRN

## 2022-07-09 MED ORDER — NITROGLYCERIN 1 MG/10 ML FOR IR/CATH LAB
INTRA_ARTERIAL | Status: AC
  Filled 2022-07-09: qty 10

## 2022-07-09 MED ORDER — ONDANSETRON HCL 4 MG/2ML IJ SOLN: 4.0000 mg | Freq: Four times a day (QID) | INTRAMUSCULAR | Status: DC | PRN

## 2022-07-09 MED ORDER — PRAVASTATIN SODIUM 10 MG PO TABS
20.0000 mg | ORAL_TABLET | ORAL | Status: DC
  Administered 2022-07-09: 20 mg via ORAL
  Filled 2022-07-09: qty 2

## 2022-07-09 MED ORDER — HEPARIN (PORCINE) IN NACL 1000-0.9 UT/500ML-% IV SOLN
INTRAVENOUS | Status: DC | PRN
  Administered 2022-07-09 (×2): 500 mL

## 2022-07-09 MED ORDER — HEPARIN SODIUM (PORCINE) 1000 UNIT/ML IJ SOLN
INTRAMUSCULAR | Status: AC
Start: 2022-07-09 — End: ?
  Filled 2022-07-09: qty 10

## 2022-07-09 MED ORDER — SODIUM CHLORIDE 0.9% FLUSH
3.0000 mL | Freq: Two times a day (BID) | INTRAVENOUS | Status: DC
  Administered 2022-07-10: 3 mL via INTRAVENOUS

## 2022-07-09 MED ORDER — LIDOCAINE HCL (PF) 1 % IJ SOLN
INTRAMUSCULAR | Status: AC
Start: 2022-07-09 — End: ?
  Filled 2022-07-09: qty 30

## 2022-07-09 MED ORDER — APIXABAN 5 MG PO TABS
5.0000 mg | ORAL_TABLET | Freq: Two times a day (BID) | ORAL | Status: DC
  Administered 2022-07-10: 5 mg via ORAL
  Filled 2022-07-09: qty 1

## 2022-07-09 MED ORDER — ACETAMINOPHEN 325 MG PO TABS: 650.0000 mg | ORAL_TABLET | ORAL | Status: DC | PRN

## 2022-07-09 MED ORDER — DONEPEZIL HCL 5 MG PO TABS: 5.0000 mg | ORAL_TABLET | Freq: Every day | ORAL | Status: DC

## 2022-07-09 MED ORDER — SODIUM CHLORIDE 0.9 % IV SOLN: 250.0000 mL | INTRAVENOUS | Status: DC | PRN

## 2022-07-09 MED ORDER — CLOPIDOGREL BISULFATE 300 MG PO TABS
ORAL_TABLET | ORAL | Status: AC
  Filled 2022-07-09: qty 2

## 2022-07-09 MED ORDER — ASPIRIN 81 MG PO TBEC
81.0000 mg | DELAYED_RELEASE_TABLET | Freq: Every day | ORAL | Status: DC
  Administered 2022-07-10: 81 mg via ORAL
  Filled 2022-07-09: qty 1

## 2022-07-09 MED ORDER — LABETALOL HCL 5 MG/ML IV SOLN: 10.0000 mg | INTRAVENOUS | Status: AC | PRN

## 2022-07-09 MED ORDER — PANTOPRAZOLE SODIUM 40 MG PO TBEC
40.0000 mg | DELAYED_RELEASE_TABLET | Freq: Every day | ORAL | Status: DC
  Administered 2022-07-09 – 2022-07-10 (×2): 40 mg via ORAL
  Filled 2022-07-09 (×2): qty 1

## 2022-07-09 MED ORDER — LIDOCAINE HCL (PF) 1 % IJ SOLN
INTRAMUSCULAR | Status: DC | PRN
  Administered 2022-07-09: 2 mL

## 2022-07-09 MED ORDER — SODIUM CHLORIDE 0.9 % WEIGHT BASED INFUSION
1.0000 mL/kg/h | INTRAVENOUS | Status: DC
  Administered 2022-07-09: 1 mL/kg/h via INTRAVENOUS

## 2022-07-09 MED ORDER — FENTANYL CITRATE (PF) 100 MCG/2ML IJ SOLN
INTRAMUSCULAR | Status: DC | PRN
  Administered 2022-07-09: 50 ug via INTRAVENOUS
  Administered 2022-07-09 (×2): 25 ug via INTRAVENOUS

## 2022-07-09 MED ORDER — MIDAZOLAM HCL 2 MG/2ML IJ SOLN
INTRAMUSCULAR | Status: DC | PRN
  Administered 2022-07-09 (×2): 1 mg via INTRAVENOUS
  Administered 2022-07-09: 2 mg via INTRAVENOUS

## 2022-07-09 MED ORDER — METOPROLOL SUCCINATE ER 25 MG PO TB24: 12.5000 mg | ORAL_TABLET | Freq: Every day | ORAL | Status: DC

## 2022-07-09 MED ORDER — CLOPIDOGREL BISULFATE 300 MG PO TABS
ORAL_TABLET | ORAL | Status: DC | PRN
  Administered 2022-07-09: 600 mg via ORAL

## 2022-07-09 MED ORDER — HYDRALAZINE HCL 20 MG/ML IJ SOLN: 10.0000 mg | INTRAMUSCULAR | Status: AC | PRN

## 2022-07-09 MED ORDER — HEPARIN (PORCINE) IN NACL 1000-0.9 UT/500ML-% IV SOLN
INTRAVENOUS | Status: AC
  Filled 2022-07-09: qty 1000

## 2022-07-09 MED ORDER — IOHEXOL 350 MG/ML SOLN
INTRAVENOUS | Status: DC | PRN
  Administered 2022-07-09: 120 mL

## 2022-07-09 MED ORDER — HEPARIN SODIUM (PORCINE) 1000 UNIT/ML IJ SOLN
INTRAMUSCULAR | Status: AC
  Filled 2022-07-09: qty 10

## 2022-07-09 MED ORDER — SODIUM CHLORIDE 0.9% FLUSH: 3.0000 mL | Freq: Two times a day (BID) | INTRAVENOUS | Status: DC

## 2022-07-09 SURGICAL SUPPLY — 31 items
BALL SAPPHIRE NC24 3.5X15 (BALLOONS) ×2
BALL SAPPHIRE NC24 4.5X10 (BALLOONS) ×2
BALLOON SAPPHIRE NC24 3.5X15 (BALLOONS) IMPLANT
BALLOON SAPPHIRE NC24 4.5X10 (BALLOONS) IMPLANT
CABLE ADAPT PACING TEMP 12FT (ADAPTER) ×1 IMPLANT
CATH 5FR JL3.5 JR4 ANG PIG MP (CATHETERS) ×1 IMPLANT
CATH LAUNCHER 6FR EBU3.5 (CATHETERS) ×1 IMPLANT
CATH OPTICROSS HD (CATHETERS) ×1 IMPLANT
CATH S G BIP PACING (CATHETERS) ×1 IMPLANT
CATH VISTA GUIDE 6FR JR4 (CATHETERS) ×3 IMPLANT
CLOSURE PERCLOSE PROSTYLE (VASCULAR PRODUCTS) ×1 IMPLANT
CROWN DIAMONDBACK CLASSIC 1.25 (BURR) ×1 IMPLANT
DEVICE RAD COMP TR BAND LRG (VASCULAR PRODUCTS) ×1 IMPLANT
GLIDESHEATH SLEND SS 6F .021 (SHEATH) ×1 IMPLANT
GUIDEWIRE INQWIRE 1.5J.035X260 (WIRE) IMPLANT
GUIDEWIRE PRESSURE X 175 (WIRE) ×1 IMPLANT
INQWIRE 1.5J .035X260CM (WIRE) ×2
KIT ENCORE 26 ADVANTAGE (KITS) ×1 IMPLANT
KIT HEART LEFT (KITS) ×3 IMPLANT
KIT MICROPUNCTURE NIT STIFF (SHEATH) ×1 IMPLANT
PACK CARDIAC CATHETERIZATION (CUSTOM PROCEDURE TRAY) ×3 IMPLANT
PINNACLE LONG 6F 25CM (SHEATH) ×2
SHEATH INTRO PINNACLE 6F 25CM (SHEATH) IMPLANT
SHEATH PROBE COVER 6X72 (BAG) ×1 IMPLANT
SLED PULL BACK IVUS (MISCELLANEOUS) ×1 IMPLANT
STENT SYNERGY XD 4.0X16 (Permanent Stent) IMPLANT
SYNERGY XD 4.0X16 (Permanent Stent) ×2 IMPLANT
TRANSDUCER W/STOPCOCK (MISCELLANEOUS) ×3 IMPLANT
TUBING CIL FLEX 10 FLL-RA (TUBING) ×3 IMPLANT
WIRE COUGAR XT STRL 190CM (WIRE) ×1 IMPLANT
WIRE VIPERWIRE COR FLEX .012 (WIRE) ×1 IMPLANT

## 2022-07-09 NOTE — Progress Notes (Signed)
Mobility Specialist - Progress Note   07/09/22 1210  Mobility  Activity Ambulated with assistance in hallway  Level of Assistance Contact guard assist, steadying assist  Assistive Device Front wheel walker  Distance Ambulated (ft) 550 ft  Activity Response Tolerated well  $Mobility charge 1 Mobility   Pt was received in bed and agreeable to mobility. No c/o pain throughout ambulation. Pt was left in bed with all needs met.   Larey Seat

## 2022-07-09 NOTE — Progress Notes (Addendum)
Sean Miranda for heparin Indication: chest pain/ACS  Allergies  Allergen Reactions   Lipitor [Atorvastatin Calcium] Other (See Comments)    Muscle pain   Vantin [Cefpodoxime] Rash    Patient Measurements: Height: 6' (182.9 cm) Weight: 87 kg (191 lb 12.8 oz) IBW/kg (Calculated) : 77.6 kg Heparin Dosing Weight: 83 kg  Vital Signs: Temp: 97.7 F (36.5 C) (08/07 0807) Temp Source: Oral (08/07 0807) BP: 146/56 (08/07 0807) Pulse Rate: 43 (08/07 0807)  Labs: Recent Labs    07/08/22 1906 07/08/22 1908 07/08/22 2103 07/09/22 0607  HGB 13.0  --   --  12.7*  HCT 36.9*  --   --  36.4*  PLT 187  --   --  192  APTT  --  29  --  55*  LABPROT  --   --   --  14.2  INR  --   --   --  1.1  HEPARINUNFRC  --  0.53  --  0.41  CREATININE 1.22  --   --  1.08  TROPONINIHS 5  --  16  --      Estimated Creatinine Clearance: 49.9 mL/min (by C-G formula based on SCr of 1.08 mg/dL).   Medical History: Past Medical History:  Diagnosis Date   Arthritis    neck    Atrial fibrillation ([place])    a. new-onset in 01/2017. Started on Eliquis   CAD (coronary artery disease), native coronary artery 05/16/2017   LAD 95%>>0 w/ 2.5 x 16 mm Synergy DES, D2 40%, RCA 50%, EF 55-65%, LVEDP mod elevated   Calcification of coronary artery    a. mild, nonobstructive CAD by cath in 2009.   Cancer of skin of chest    Carotid artery occlusion    a. s/p R CEA in 2014   Colon polyps    Hyperlipidemia    takes Pravastatin every other day   Pneumonia ~ 01/2013    Medications:  Medications Prior to Admission  Medication Sig Dispense Refill Last Dose   apixaban (ELIQUIS) 5 MG TABS tablet TAKE (1) TABLET BY MOUTH TWICE DAILY. 60 tablet 6 07/08/2022 at [place] ACID PO Take 250 mg by mouth daily.       b complex vitamins capsule Take 1 capsule by mouth every other day.      cetirizine (ZYRTEC) 10 MG tablet Take 10 mg by mouth daily.      Cholecalciferol  (VITAMIN D3) 75 MCG (3000 UT) TABS Take 3,000 Units by mouth daily.       Coenzyme Q10 (COQ10) 400 MG CAPS Take 800 mg by mouth in the morning and at bedtime.       DHEA 25 MG CAPS Take 25 mg by mouth daily.      dofetilide (TIKOSYN) 250 MCG capsule TAKE (1) CAPSULE BY MOUTH TWICE DAILY. 180 capsule 3    donepezil (ARICEPT) 5 MG tablet Take 5 mg by mouth daily.      Glucosamine-Chondroitin 500-400 MG CAPS Take 500 mg by mouth daily.       L-Arginine 1000 MG TABS Take 1,000 mg by mouth daily.      Lecithin 1200 MG CAPS Take 1,200 mg by mouth daily. Triple      Magnesium 500 MG TABS Take 500 mg by mouth daily.       memantine (NAMENDA) 5 MG tablet Take 5 mg by mouth daily.      metoprolol succinate (TOPROL XL) 25  MG 24 hr tablet Take 0.5 tablets (12.5 mg total) by mouth daily. 45 tablet 3    Multiple Vitamin (MULTIVITAMIN WITH MINERALS) TABS tablet Take 1 tablet by mouth daily.      nitroGLYCERIN (NITROSTAT) 0.4 MG SL tablet Place 1 tablet (0.4 mg total) under the tongue every 5 (five) minutes as needed for chest pain. 30 tablet 0    Omega 3 1200 MG CAPS Take 1,200 mg by mouth daily.      pravastatin (PRAVACHOL) 40 MG tablet TAKE 1/2 TABLET BY MOUTH EVERY MONDAY, WEDNESDAY AND FRIDAY. 36 tablet 6    selenium 200 MCG TABS tablet Take 200 mcg by mouth daily. Plus Vitamin E      sildenafil (REVATIO) 20 MG tablet TAKE 2 TO 3 TABLETS BY MOUTH AS NEEDED FOR SEXUAL ACTIVITY 60 tablet 4    zinc gluconate 50 MG tablet Take 50 mg by mouth daily.       Assessment: Pharmacy consulted to dose heparin in patient with chest pain/ACS.  Patient is on apixaban prior to admission with last dose 8/6 @ 0830.  Will need to dose based on aPTT until correlation with heparin levels. Heparin level today is 0.41 and aPTT subtherapeutic at 55, on 1000 units/hr. Hgb stable 12.7, plt 192. no line issues or signs/symptoms of bleeding per RN.Plan for cath today.   CBC WNL  Goal of Therapy:  Heparin level 0.3-0.7  units/ml aPTT 66-102 seconds Monitor platelets by anticoagulation protocol: Yes   Plan:  Increase heparin infusion to 1150 units/hr. Heparin level in 8 hours and daily. Monitor H&H and platelets.   Jeneen Rinks, Pharm.D PGY1 Pharmacy Resident 07/09/2022 10:19 AM

## 2022-07-09 NOTE — H&P (View-Only) (Signed)
Progress Note  Patient Name: Sean Miranda Date of Encounter: 07/09/2022  [facility] Cardiologist: Carlyle Dolly, MD   Subjective   No further chest pain since nitro x 2. Unable to rest last night. Sinus bradycardia in the 30s while awake, seems to be asymptomatic. Thursday night, fell out of bed while dreaming and hurt his left knee.   Inpatient Medications    Scheduled Meds:  [START ON 07/10/2022] aspirin EC  81 mg Oral Daily   dofetilide  250 mcg Oral BID   donepezil  5 mg Oral Daily   metoprolol succinate  12.5 mg Oral Daily   pravastatin  20 mg Oral Q M,W,F   Continuous Infusions:  heparin 1,000 Units/hr (07/08/22 2027)   PRN Meds: acetaminophen, nitroGLYCERIN, ondansetron (ZOFRAN) IV   Vital Signs    Vitals:   07/08/22 2301 07/08/22 2330 07/09/22 0030 07/09/22 0431  BP: (!) 179/63 92/81 (!) 170/57 (!) 151/54  Pulse: (!) 52 (!) 46  (!) 41  Resp: '16 16 18 18  '$ Temp: 97.6 F (36.4 C) 97.6 F (36.4 C) 97.8 F (36.6 C) 97.9 F (36.6 C)  TempSrc: Oral Oral Oral Oral  SpO2: 97% 95% 98% 99%  Weight:   87 kg   Height:   6' (1.829 m)     Intake/Output Summary (Last 24 hours) at 07/09/2022 0738 Last data filed at 07/09/2022 0600 Gross per 24 hour  Intake 75.43 ml  Output 725 ml  Net -649.57 ml      07/09/2022   12:30 AM 07/08/2022    6:18 PM 02/13/2022   10:50 AM  Last 3 Weights  Weight (lbs) 191 lb 12.8 oz 183 lb 181 lb  Weight (kg) 87 kg 83.008 kg 82.101 kg      Telemetry    Sinus bradycardia 30-50s - Personally Reviewed  ECG    No new tracings - Personally Reviewed  Physical Exam   GEN: No acute distress.   Neck: No JVD Cardiac: regular rhythm, bradycardic rate Respiratory: Clear to auscultation bilaterally. GI: Soft, nontender, non-distended  MS: No edema; No deformity. Neuro:  Nonfocal  Psych: Normal affect   Labs    High Sensitivity Troponin:   Recent Labs  Lab 07/08/22 1906 07/08/22 2103  TROPONINIHS 5 16     Chemistry Recent  Labs  Lab 07/08/22 1906  NA 130*  K 3.9  CL 102  CO2 25  GLUCOSE 120*  BUN 20  CREATININE 1.22  CALCIUM 8.7*  PROT 6.9  ALBUMIN 3.7  AST 20  ALT 16  ALKPHOS 64  BILITOT 0.5  GFRNONAA 56*  ANIONGAP 3*    Lipids No results for input(s): "CHOL", "TRIG", "HDL", "LABVLDL", "LDLCALC", "CHOLHDL" in the last 168 hours.  Hematology Recent Labs  Lab 07/08/22 1906  WBC 6.3  RBC 4.00*  HGB 13.0  HCT 36.9*  MCV 92.3  MCH 32.5  MCHC 35.2  RDW 13.3  PLT 187   Thyroid No results for input(s): "TSH", "FREET4" in the last 168 hours.  BNPNo results for input(s): "BNP", "PROBNP" in the last 168 hours.  DDimer No results for input(s): "DDIMER" in the last 168 hours.   Radiology    DG Knee Complete 4 Views Left  Result Date: 07/08/2022 CLINICAL DATA:  Fall, left knee pain EXAM: LEFT KNEE - COMPLETE 4+ VIEW COMPARISON:  None Available. FINDINGS: No definite evidence of fracture, dislocation, or joint effusion. Corticated appearing probable enthesopathic change or heterotopic ossification of the posterior aspect of the  left fibular neck. Minimal tricompartmental arthrosis. Soft tissues are unremarkable. IMPRESSION: 1. No acute fracture or dislocation of the left knee. Minimal arthrosis. 2. Corticated appearing probable enthesopathic change or heterotopic ossification of the posterior left aspect of the fibular neck. Consider additional if there is acute point tenderness or other concern for fibular neck fracture. Electronically Signed   By: Delanna Ahmadi M.D.   On: 07/08/2022 19:03   DG Chest 2 View  Result Date: 07/08/2022 CLINICAL DATA:  Left-sided chest pain EXAM: CHEST - 2 VIEW COMPARISON:  07/18/2020 FINDINGS: The heart size and mediastinal contours are within normal limits. Both lungs are clear. The visualized skeletal structures are unremarkable. IMPRESSION: No acute abnormality of the lungs. Electronically Signed   By: Delanna Ahmadi M.D.   On: 07/08/2022 19:01    Cardiac Studies    Echo 09/05/20:  1. Left ventricular ejection fraction, by estimation, is 65 to 70%. The  left ventricle has normal function. The left ventricle has no regional  wall motion abnormalities. Left ventricular diastolic parameters are  indeterminate.   2. Right ventricular systolic function is normal. The right ventricular  size is normal.   3. Left atrial size was mild to moderately dilated.   4. The mitral valve is normal in structure. No evidence of mitral valve  regurgitation. No evidence of mitral stenosis.   5. The aortic valve was not well visualized. There is mild calcification  of the aortic valve. There is mild thickening of the aortic valve. Aortic  valve regurgitation is not visualized. No aortic stenosis is present.   6. The inferior vena cava is normal in size with greater than 50%  respiratory variability, suggesting right atrial pressure of 3 mmHg.    Left heart cath  2018 Mid RCA lesion, 50 %stenosed. Ost RCA lesion, 25 %stenosed. Lesion noted in small RV marginal. LM lesion, 25 %stenosed. 2nd Diag lesion, 40 %stenosed. Mid LAD-1 lesion, 20 %stenosed. Mid LAD-2 lesion, 95 %stenosed. A STENT SYNERGY DES 2.5X16 drug eluting stent was successfully placed, postdilated to 3.0 mm. Post intervention, there is a 0% residual stenosis. The left ventricular systolic function is normal. LV end diastolic pressure is moderately elevated. The left ventricular ejection fraction is 55-65% by visual estimate. There is no aortic valve stenosis.   LAD was the culprit for his unstable angina. He had symptoms during the procedure at rest. These resolved after stent placement. Watch overnight. Continue IV Angiomax for another hour.   Loaded with Plavix today. We'll start 75 mg daily tomorrow. Given aspirin today as well. Would plan for aspirin, Plavix and Eliquis for 30 days. After 30 days, would stop the aspirin and continue Plavix and Eliquis. A synergy drug-eluting stent was used since he  is on long-term anticoagulation. If there is a bleeding complication, his antiplatelet therapy could be stopped sooner than usual. Ideally, he would get 6-12 months of Plavix depending on his bleeding risk profile.   Possible discharge tomorrow if he does well tonight and with cardiac rehabilitation tomorrow.   Patient Profile     88 y.o. male persistent afib, on flecainide and Eliquis, R-CEA, HLD, OA, RBBB & LAFB, CAD wtih DES to mLAD 2018, normal NST 2020.  Assessment & Plan    Chest pain - EKG changes with chest pain - HST 5 --> 16 - received nitro x 2 and 324 mg ASA+ - continue Toprol, pravastatin - pt is comfortable today, no further chest pain - he does not remember if this CP  was similar to his prior PCI - given EKG changes, plan for definitive angiography this afternoon   CAD Left heat cath 2018 with 95% stenosis in the mid LAD treated with 2.5 x 16 mm DES, residual nonobstructive disease in the RCA - continue ASA   Persistent atrial fibrillation - continue tikosyn, torpol - missed one dose of tikosyn   Chronic anticoagulation Last dose of eliquis was yesterday morning Continue heparin gtt for Afib and ACS   Hypertension - continue Toprol - pt hypertensive this morning, consider starting amlodipine if BP isn't improved after toprol this morning   Hyperlipidemia - will collect updated lipid profile - continue pravastatin three times weekly   The patient understands that risks included but are not limited to stroke (1 in 1000), death (1 in 1000), kidney failure [usually temporary] (1 in 500), bleeding (1 in 200), allergic reaction [possibly serious] (1 in 200).      For questions or updates, please contact [place] Please consult [email or web address] for contact info under        Signed, Ledora Bottcher, PA  07/09/2022, 7:38 AM     Agree with note by Fabian Sharp PA-C  History of LAD intervention by Dr. Irish Lack in 2018.  Other problems as outlined.   History of PAF on oral anticoagulation currently in sinus rhythm.  His last Eliquis was yesterday morning.  He is on IV heparin.  He has had no recurrent chest pain.  His exam is benign.  Plan coronary angiography today.   Lorretta Harp, M.D., [place], [facility], [place] [address] [place], [place]  [address]  [phone] 07/09/2022 9:21 AM

## 2022-07-09 NOTE — Progress Notes (Addendum)
Progress Note  Patient Name: Sean Miranda Date of Encounter: 07/09/2022  [facility] Cardiologist: Carlyle Dolly, MD   Subjective   No further chest pain since nitro x 2. Unable to rest last night. Sinus bradycardia in the 30s while awake, seems to be asymptomatic. Thursday night, fell out of bed while dreaming and hurt his left knee.   Inpatient Medications    Scheduled Meds:  [START ON 07/10/2022] aspirin EC  81 mg Oral Daily   dofetilide  250 mcg Oral BID   donepezil  5 mg Oral Daily   metoprolol succinate  12.5 mg Oral Daily   pravastatin  20 mg Oral Q M,W,F   Continuous Infusions:  heparin 1,000 Units/hr (07/08/22 2027)   PRN Meds: acetaminophen, nitroGLYCERIN, ondansetron (ZOFRAN) IV   Vital Signs    Vitals:   07/08/22 2301 07/08/22 2330 07/09/22 0030 07/09/22 0431  BP: (!) 179/63 92/81 (!) 170/57 (!) 151/54  Pulse: (!) 52 (!) 46  (!) 41  Resp: '16 16 18 18  '$ Temp: 97.6 F (36.4 C) 97.6 F (36.4 C) 97.8 F (36.6 C) 97.9 F (36.6 C)  TempSrc: Oral Oral Oral Oral  SpO2: 97% 95% 98% 99%  Weight:   87 kg   Height:   6' (1.829 m)     Intake/Output Summary (Last 24 hours) at 07/09/2022 0738 Last data filed at 07/09/2022 0600 Gross per 24 hour  Intake 75.43 ml  Output 725 ml  Net -649.57 ml      07/09/2022   12:30 AM 07/08/2022    6:18 PM 02/13/2022   10:50 AM  Last 3 Weights  Weight (lbs) 191 lb 12.8 oz 183 lb 181 lb  Weight (kg) 87 kg 83.008 kg 82.101 kg      Telemetry    Sinus bradycardia 30-50s - Personally Reviewed  ECG    No new tracings - Personally Reviewed  Physical Exam   GEN: No acute distress.   Neck: No JVD Cardiac: regular rhythm, bradycardic rate Respiratory: Clear to auscultation bilaterally. GI: Soft, nontender, non-distended  MS: No edema; No deformity. Neuro:  Nonfocal  Psych: Normal affect   Labs    High Sensitivity Troponin:   Recent Labs  Lab 07/08/22 1906 07/08/22 2103  TROPONINIHS 5 16     Chemistry Recent  Labs  Lab 07/08/22 1906  NA 130*  K 3.9  CL 102  CO2 25  GLUCOSE 120*  BUN 20  CREATININE 1.22  CALCIUM 8.7*  PROT 6.9  ALBUMIN 3.7  AST 20  ALT 16  ALKPHOS 64  BILITOT 0.5  GFRNONAA 56*  ANIONGAP 3*    Lipids No results for input(s): "CHOL", "TRIG", "HDL", "LABVLDL", "LDLCALC", "CHOLHDL" in the last 168 hours.  Hematology Recent Labs  Lab 07/08/22 1906  WBC 6.3  RBC 4.00*  HGB 13.0  HCT 36.9*  MCV 92.3  MCH 32.5  MCHC 35.2  RDW 13.3  PLT 187   Thyroid No results for input(s): "TSH", "FREET4" in the last 168 hours.  BNPNo results for input(s): "BNP", "PROBNP" in the last 168 hours.  DDimer No results for input(s): "DDIMER" in the last 168 hours.   Radiology    DG Knee Complete 4 Views Left  Result Date: 07/08/2022 CLINICAL DATA:  Fall, left knee pain EXAM: LEFT KNEE - COMPLETE 4+ VIEW COMPARISON:  None Available. FINDINGS: No definite evidence of fracture, dislocation, or joint effusion. Corticated appearing probable enthesopathic change or heterotopic ossification of the posterior aspect of the  left fibular neck. Minimal tricompartmental arthrosis. Soft tissues are unremarkable. IMPRESSION: 1. No acute fracture or dislocation of the left knee. Minimal arthrosis. 2. Corticated appearing probable enthesopathic change or heterotopic ossification of the posterior left aspect of the fibular neck. Consider additional if there is acute point tenderness or other concern for fibular neck fracture. Electronically Signed   By: Delanna Ahmadi M.D.   On: 07/08/2022 19:03   DG Chest 2 View  Result Date: 07/08/2022 CLINICAL DATA:  Left-sided chest pain EXAM: CHEST - 2 VIEW COMPARISON:  07/18/2020 FINDINGS: The heart size and mediastinal contours are within normal limits. Both lungs are clear. The visualized skeletal structures are unremarkable. IMPRESSION: No acute abnormality of the lungs. Electronically Signed   By: Delanna Ahmadi M.D.   On: 07/08/2022 19:01    Cardiac Studies    Echo 09/05/20:  1. Left ventricular ejection fraction, by estimation, is 65 to 70%. The  left ventricle has normal function. The left ventricle has no regional  wall motion abnormalities. Left ventricular diastolic parameters are  indeterminate.   2. Right ventricular systolic function is normal. The right ventricular  size is normal.   3. Left atrial size was mild to moderately dilated.   4. The mitral valve is normal in structure. No evidence of mitral valve  regurgitation. No evidence of mitral stenosis.   5. The aortic valve was not well visualized. There is mild calcification  of the aortic valve. There is mild thickening of the aortic valve. Aortic  valve regurgitation is not visualized. No aortic stenosis is present.   6. The inferior vena cava is normal in size with greater than 50%  respiratory variability, suggesting right atrial pressure of 3 mmHg.    Left heart cath  2018 Mid RCA lesion, 50 %stenosed. Ost RCA lesion, 25 %stenosed. Lesion noted in small RV marginal. LM lesion, 25 %stenosed. 2nd Diag lesion, 40 %stenosed. Mid LAD-1 lesion, 20 %stenosed. Mid LAD-2 lesion, 95 %stenosed. A STENT SYNERGY DES 2.5X16 drug eluting stent was successfully placed, postdilated to 3.0 mm. Post intervention, there is a 0% residual stenosis. The left ventricular systolic function is normal. LV end diastolic pressure is moderately elevated. The left ventricular ejection fraction is 55-65% by visual estimate. There is no aortic valve stenosis.   LAD was the culprit for his unstable angina. He had symptoms during the procedure at rest. These resolved after stent placement. Watch overnight. Continue IV Angiomax for another hour.   Loaded with Plavix today. We'll start 75 mg daily tomorrow. Given aspirin today as well. Would plan for aspirin, Plavix and Eliquis for 30 days. After 30 days, would stop the aspirin and continue Plavix and Eliquis. A synergy drug-eluting stent was used since he  is on long-term anticoagulation. If there is a bleeding complication, his antiplatelet therapy could be stopped sooner than usual. Ideally, he would get 6-12 months of Plavix depending on his bleeding risk profile.   Possible discharge tomorrow if he does well tonight and with cardiac rehabilitation tomorrow.   Patient Profile     88 y.o. male persistent afib, on flecainide and Eliquis, R-CEA, HLD, OA, RBBB & LAFB, CAD wtih DES to mLAD 2018, normal NST 2020.  Assessment & Plan    Chest pain - EKG changes with chest pain - HST 5 --> 16 - received nitro x 2 and 324 mg ASA+ - continue Toprol, pravastatin - pt is comfortable today, no further chest pain - he does not remember if this CP  was similar to his prior PCI - given EKG changes, plan for definitive angiography this afternoon   CAD Left heat cath 2018 with 95% stenosis in the mid LAD treated with 2.5 x 16 mm DES, residual nonobstructive disease in the RCA - continue ASA   Persistent atrial fibrillation - continue tikosyn, torpol - missed one dose of tikosyn   Chronic anticoagulation Last dose of eliquis was yesterday morning Continue heparin gtt for Afib and ACS   Hypertension - continue Toprol - pt hypertensive this morning, consider starting amlodipine if BP isn't improved after toprol this morning   Hyperlipidemia - will collect updated lipid profile - continue pravastatin three times weekly   The patient understands that risks included but are not limited to stroke (1 in 1000), death (1 in 1000), kidney failure [usually temporary] (1 in 500), bleeding (1 in 200), allergic reaction [possibly serious] (1 in 200).      For questions or updates, please contact [place] Please consult [email or web address] for contact info under        Signed, Ledora Bottcher, PA  07/09/2022, 7:38 AM     Agree with note by Fabian Sharp PA-C  History of LAD intervention by Dr. Irish Lack in 2018.  Other problems as outlined.   History of PAF on oral anticoagulation currently in sinus rhythm.  His last Eliquis was yesterday morning.  He is on IV heparin.  He has had no recurrent chest pain.  His exam is benign.  Plan coronary angiography today.   Lorretta Harp, M.D., [place], [facility], [place] [address] [place], [place]  [address]  [phone] 07/09/2022 9:21 AM

## 2022-07-09 NOTE — H&P (Signed)
Cardiology Admission History and Physical:   Patient ID: Sean Miranda MRN: [id number]; DOB: [birth year]   Admission date: 07/08/2022  PCP:  Billie Ruddy, MD   [facility] Providers Cardiologist:  Carlyle Dolly, MD        Chief Complaint:  chest pain  Patient Profile:   Sean Miranda is a 88 y.o. male with CAD, HLD, A-fib on apixaban, HTN who is being seen 07/09/2022 for the evaluation of chest pain.  History of Present Illness:   Sean Miranda is a 88 year old gentleman with HTN, HLD, CAD, A-fib on apixaban, who presents with chest pain that started around 6 PM yesterday evening when he was outside gathering trash to take to his trash bin.  He had sudden onset chest pain that was crushing and left-sided in nature without any associated shortness of breath, diaphoresis, nausea, or vomiting.  States that the pain was persistent and lasted for about 10 to 15 minutes and he presented immediately to the emergency department afterwards.  Initial EKG at the outside emergency department with only some subtle ST and T wave changes and initial troponin was 5.  However the patient had recurrent chest pain in the emergency department and repeat ECG with very notable changes including elevation in aVR, and global ST depression.  Repeat troponin was 16.  Notably, he is very active overall and works out every other day.  Former smoker quit in 1974.  He has known coronary disease with prior PCI in 2018 and a subsequent normal stress test in 2020.  In 2021 with normal function.  On arrival to [facility], he is stable and without chest pain.  He is on heparin for anticoagulation.  He has no acute complaints at this time.   Past Medical History:  Diagnosis Date   Arthritis    neck    Atrial fibrillation ([place])    a. new-onset in 01/2017. Started on Eliquis   CAD (coronary artery disease), native coronary artery 05/16/2017   LAD 95%>>0 w/ 2.5 x 16 mm Synergy DES, D2 40%, RCA 50%, EF 55-65%,  LVEDP mod elevated   Calcification of coronary artery    a. mild, nonobstructive CAD by cath in 2009.   Cancer of skin of chest    Carotid artery occlusion    a. s/p R CEA in 2014   Colon polyps    Hyperlipidemia    takes Pravastatin every other day   Pneumonia ~ 01/2013    Past Surgical History:  Procedure Laterality Date   CARDIAC CATHETERIZATION  06/09/1999   mild CAD (LAD, diagonal, RCA) - Dr. Rockne Menghini   CARDIOVERSION N/A 03/25/2017   Procedure: CARDIOVERSION;  Surgeon: Pixie Casino, MD;  Location: [facility] ENDOSCOPY;  Service: Cardiovascular;  Laterality: N/A;   CARDIOVERSION N/A 09/28/2020   Procedure: CARDIOVERSION;  Surgeon: [name], MD;  Location: [facility] ENDOSCOPY;  Service: Cardiovascular;  Laterality: N/A;   CAROTID DOPPLER  02/2012   60-79% RICA stenosis, 33-00% LICA stenosis (prior to carotid endarterectomy)   COLONOSCOPY     CORONARY STENT INTERVENTION N/A 05/16/2017   Procedure: Coronary Stent Intervention;  Surgeon: Jettie Booze, MD;  Location: [place] CV LAB;  Service: Cardiovascular;  Laterality: N/A;   ENDARTERECTOMY Right 11/10/2013   Procedure: ENDARTERECTOMY CAROTID;  Surgeon: Conrad Yankee Hill, MD;  Location: [place];  Service: Vascular;  Laterality: Right;   INGUINAL HERNIA REPAIR Left    LEFT HEART CATH AND CORONARY ANGIOGRAPHY N/A 05/16/2017   Procedure: Left Heart  Cath and Coronary Angiography;  Surgeon: Jettie Booze, MD;  Location: [place] CV LAB;  Service: Cardiovascular;  Laterality: N/A;   MOLE REMOVAL  1990s   "chest"   NM MYOCAR PERF WALL MOTION  04/2011   bruce myoview - perfusion defect in inferior myocardium (diaphragmatic attenuation), remaining myocardium with normal perfusion, EF 67%   PATCH ANGIOPLASTY Right 11/10/2013   Procedure: PATCH ANGIOPLASTY;  Surgeon: Conrad Lowrys, MD;  Location: [place];  Service: Vascular;  Laterality: Right;   PILONIDAL CYST DRAINAGE     SHOULDER OPEN ROTATOR CUFF REPAIR Bilateral [place]  ~ 2015   "chest"   TONSILLECTOMY       Medications Prior to Admission: Prior to Admission medications   Medication Sig Start Date End Date Taking? Authorizing Provider  ALPHA LIPOIC ACID PO Take 250 mg by mouth daily.     [provider]  apixaban (ELIQUIS) 5 MG TABS tablet TAKE (1) TABLET BY MOUTH TWICE DAILY. 01/01/22   Evans Lance, MD  b complex vitamins capsule Take 1 capsule by mouth every other day.    [provider]  Cholecalciferol (VITAMIN D3) 75 MCG (3000 UT) TABS Take 3,000 Units by mouth daily.     [provider]  Coenzyme Q10 (COQ10) 400 MG CAPS Take 800 mg by mouth in the morning and at bedtime.     [provider]  DHEA 25 MG CAPS Take 25 mg by mouth daily.    [provider]  dofetilide (TIKOSYN) 250 MCG capsule TAKE (1) CAPSULE BY MOUTH TWICE DAILY. 06/21/21   Evans Lance, MD  donepezil (ARICEPT) 5 MG tablet Take 5 mg by mouth daily. 11/01/21   [provider]  Glucosamine-Chondroitin 500-400 MG CAPS Take 500 mg by mouth daily.     [provider]  L-Arginine 1000 MG TABS Take 1,000 mg by mouth daily.    [provider]  Lecithin 1200 MG CAPS Take 1,200 mg by mouth daily. Triple    [provider]  Magnesium 500 MG TABS Take 500 mg by mouth daily.     [provider]  metoprolol succinate (TOPROL XL) 25 MG 24 hr tablet Take 0.5 tablets (12.5 mg total) by mouth daily. 03/20/22   Arnoldo Lenis, MD  Multiple Vitamin (MULTIVITAMIN WITH MINERALS) TABS tablet Take 1 tablet by mouth daily.    [provider]  nitroGLYCERIN (NITROSTAT) 0.4 MG SL tablet Place 1 tablet (0.4 mg total) under the tongue every 5 (five) minutes as needed for chest pain. 10/13/19   Ripley Fraise, MD  Omega 3 1200 MG CAPS Take 1,200 mg by mouth daily.    [provider]  pravastatin (PRAVACHOL) 40 MG tablet TAKE 1/2 TABLET BY MOUTH EVERY MONDAY, WEDNESDAY AND FRIDAY.  07/17/21   Arnoldo Lenis, MD  selenium 200 MCG TABS tablet Take 200 mcg by mouth daily. Plus Vitamin E    [provider]  sildenafil (REVATIO) 20 MG tablet TAKE 2 TO 3 TABLETS BY MOUTH AS NEEDED FOR SEXUAL ACTIVITY 02/07/18   Marletta Lor, MD  zinc gluconate 50 MG tablet Take 50 mg by mouth daily.    [provider]     Allergies:    Allergies  Allergen Reactions   Lipitor [Atorvastatin Calcium] Other (See Comments)    Muscle pain   Vantin [Cefpodoxime] Rash    Social History:   Social History   Socioeconomic  History   Marital status: Married    Spouse name: Not on file   Number of children: 3   Years of education: Not on file   Highest education level: Not on file  Occupational History    Employer: [employer]  Tobacco Use   Smoking status: Former    Packs/day: 1.00    Years: 18.00    Total pack years: 18.00    Types: Cigarettes    Quit date: 12/03/1972    Years since quitting: 49.6   Smokeless tobacco: Never  Vaping Use   Vaping Use: Never used  Substance and Sexual Activity   Alcohol use: Yes    Alcohol/week: 14.0 standard drinks of alcohol    Types: 7 Glasses of wine, 7 Shots of liquor per week   Drug use: No   Sexual activity: Yes  Other Topics Concern   Not on file  Social History Narrative   Not on file   Social Determinants of Health   Financial Resource Strain: Not on file  Food Insecurity: Not on file  Transportation Needs: Not on file  Physical Activity: Not on file  Stress: Not on file  Social Connections: Not on file  Intimate Partner Violence: Not on file    Family History:   The patient's family history includes Cancer in his brother; Hypertension in his mother; Prostate cancer in his son; Stroke in his brother and father.    ROS:  Please see the history of present illness.  All other ROS reviewed and negative.     Physical Exam/Data:   Vitals:   07/08/22 2130 07/08/22 2301 07/08/22 2330 07/09/22 0030  BP:  (!) 155/47 (!) 179/63 92/81 (!) 170/57  Pulse: (!) 45 (!) 52 (!) 46   Resp: '17 16 16 18  '$ Temp:  97.6 F (36.4 C) 97.6 F (36.4 C) 97.8 F (36.6 C)  TempSrc:  Oral Oral Oral  SpO2: 97% 97% 95% 98%  Weight:    87 kg  Height:    6' (1.829 m)    Intake/Output Summary (Last 24 hours) at 07/09/2022 0427 Last data filed at 07/09/2022 0400 Gross per 24 hour  Intake 75.43 ml  Output --  Net 75.43 ml      07/09/2022   12:30 AM 07/08/2022    6:18 PM 02/13/2022   10:50 AM  Last 3 Weights  Weight (lbs) 191 lb 12.8 oz 183 lb 181 lb  Weight (kg) 87 kg 83.008 kg 82.101 kg     Body mass index is 26.01 kg/m.  General:  Well nourished, well developed, in no acute distress HEENT: normal Neck: no JVD Vascular: No carotid bruits; Distal pulses 2+ bilaterally   Cardiac:  normal S1, S2; RRR; no murmur  Lungs:  clear to auscultation bilaterally, no wheezing, rhonchi or rales  Abd: soft, nontender, no hepatomegaly  Ext: no edema Musculoskeletal:  No deformities, BUE and BLE strength normal and equal Skin: warm and dry  Neuro:  CNs 2-12 intact, no focal abnormalities noted Psych:  Normal affect    EKG:  The ECG that was done  was personally reviewed and demonstrates elevation in aVR and global ST depression  Relevant CV Studies: No recent studies, most recent echo in 2021 that was normal  Laboratory Data:  High Sensitivity Troponin:   Recent Labs  Lab 07/08/22 1906 07/08/22 2103  TROPONINIHS 5 16      Chemistry Recent Labs  Lab 07/08/22 1906  NA 130*  K 3.9  CL  102  CO2 25  GLUCOSE 120*  BUN 20  CREATININE 1.22  CALCIUM 8.7*  GFRNONAA 56*  ANIONGAP 3*    Recent Labs  Lab 07/08/22 1906  PROT 6.9  ALBUMIN 3.7  AST 20  ALT 16  ALKPHOS 64  BILITOT 0.5   Lipids No results for input(s): "CHOL", "TRIG", "HDL", "LABVLDL", "LDLCALC", "CHOLHDL" in the last 168 hours. Hematology Recent Labs  Lab 07/08/22 1906  WBC 6.3  RBC 4.00*  HGB 13.0  HCT 36.9*  MCV 92.3  MCH 32.5   MCHC 35.2  RDW 13.3  PLT 187   Thyroid No results for input(s): "TSH", "FREET4" in the last 168 hours. BNPNo results for input(s): "BNP", "PROBNP" in the last 168 hours.  DDimer No results for input(s): "DDIMER" in the last 168 hours.   Radiology/Studies:  DG Knee Complete 4 Views Left  Result Date: 07/08/2022 CLINICAL DATA:  Fall, left knee pain EXAM: LEFT KNEE - COMPLETE 4+ VIEW COMPARISON:  None Available. FINDINGS: No definite evidence of fracture, dislocation, or joint effusion. Corticated appearing probable enthesopathic change or heterotopic ossification of the posterior aspect of the left fibular neck. Minimal tricompartmental arthrosis. Soft tissues are unremarkable. IMPRESSION: 1. No acute fracture or dislocation of the left knee. Minimal arthrosis. 2. Corticated appearing probable enthesopathic change or heterotopic ossification of the posterior left aspect of the fibular neck. Consider additional if there is acute point tenderness or other concern for fibular neck fracture. Electronically Signed   By: Delanna Ahmadi M.D.   On: 07/08/2022 19:03   DG Chest 2 View  Result Date: 07/08/2022 CLINICAL DATA:  Left-sided chest pain EXAM: CHEST - 2 VIEW COMPARISON:  07/18/2020 FINDINGS: The heart size and mediastinal contours are within normal limits. Both lungs are clear. The visualized skeletal structures are unremarkable. IMPRESSION: No acute abnormality of the lungs. Electronically Signed   By: Delanna Ahmadi M.D.   On: 07/08/2022 19:01     Assessment and Plan:   # Unstable angina Presenting with typical cardiac chest pain and very concerning ECG changes when he was having active chest pain with marked global ST depression and some elevation in aVR.  Did not meet STEMI criteria however concerning enough to transfer for evaluation, I think that he should undergo a left heart catheterization for evaluation.  Agree with continuing heparin for anticoagulation. He is high risk (see below) - npo  after midnight for lhc  - echo ordered for today - heparin for anticoagulation - continue 81 mg daily  -Continue pravastatin MWF 20 mg; he has a statin intolerance;  -Continue home metoprolol - will check CBC, CMP, INR, hemoglobin A1c - referral for cardiac rehab - admit for cardiac tele; strict I&Os; daily weights  - sublingual NTG PRN for pain   # HTN -Metoprolol as above -Can consider ACE I after catheterization  #HLD Statin intolerance noted as above will continue home statin for now -Continue pravastatin  #Paroxysmal Afib Remains in sinus rhythm on dofetilide.  Stable.  -Continue dofetilide -Continue metoprolol -Hold apixaban as needed for cath; heparin as above    Risk Assessment/Risk Scores:    TIMI Risk Score for Unstable Angina or Non-ST Elevation MI:   The patient's TIMI risk score is 6, which indicates a 41% risk of all cause mortality, new or recurrent myocardial infarction or need for urgent revascularization in the next 14 days.    CHA2DS2-VASc Score = 4   This indicates a 4.8% annual risk of stroke. The patient's score  is based upon: CHF History: 0 HTN History: 1 Diabetes History: 0 Stroke History: 0 Vascular Disease History: 1 Age Score: 2 Gender Score: 0    Severity of Illness: The appropriate patient status for this patient is INPATIENT. Inpatient status is judged to be reasonable and necessary in order to provide the required intensity of service to ensure the patient's safety. The patient's presenting symptoms, physical exam findings, and initial radiographic and laboratory data in the context of their chronic comorbidities is felt to place them at high risk for further clinical deterioration. Furthermore, it is not anticipated that the patient will be medically stable for discharge from the hospital within 2 midnights of admission.   * I certify that at the point of admission it is my clinical judgment that the patient will require inpatient  hospital care spanning beyond 2 midnights from the point of admission due to high intensity of service, high risk for further deterioration and high frequency of surveillance required.*   For questions or updates, please contact [place] Please consult [email or web address] for contact info under     Signed, Doyne Keel, MD  07/09/2022 4:27 AM

## 2022-07-09 NOTE — Interval H&P Note (Signed)
History and Physical Interval Note:  07/09/2022 1:08 PM  Sean Miranda  has presented today for surgery, with the diagnosis of unstable angina.  The various methods of treatment have been discussed with the patient and family. After consideration of risks, benefits and other options for treatment, the patient has consented to  Procedure(s): LEFT HEART CATH AND CORONARY ANGIOGRAPHY (N/A) as a surgical intervention.  The patient's history has been reviewed, patient examined, no change in status, stable for surgery.  I have reviewed the patient's chart and labs.  Questions were answered to the patient's satisfaction.     Sherren Mocha

## 2022-07-10 ENCOUNTER — Inpatient Hospital Stay (HOSPITAL_COMMUNITY): Payer: Medicare HMO

## 2022-07-10 ENCOUNTER — Inpatient Hospital Stay (HOSPITAL_BASED_OUTPATIENT_CLINIC_OR_DEPARTMENT_OTHER)
Admit: 2022-07-10 | Discharge: 2022-07-10 | Disposition: A | Discharge status: Home / Self Care | Payer: Medicare HMO | Attending: Physician Assistant | Admitting: Physician Assistant

## 2022-07-10 ENCOUNTER — Encounter (HOSPITAL_COMMUNITY): Payer: Self-pay | Admitting: Cardiovascular Disease

## 2022-07-10 ENCOUNTER — Ambulatory Visit: Payer: Medicare HMO | Admitting: Cardiology

## 2022-07-10 ENCOUNTER — Other Ambulatory Visit (HOSPITAL_COMMUNITY): Payer: Self-pay

## 2022-07-10 ENCOUNTER — Telehealth (HOSPITAL_COMMUNITY): Payer: Self-pay | Admitting: Pharmacy Technician

## 2022-07-10 DIAGNOSIS — I200 Unstable angina: Secondary | ICD-10-CM | POA: Diagnosis not present

## 2022-07-10 DIAGNOSIS — I4811 Longstanding persistent atrial fibrillation: Secondary | ICD-10-CM

## 2022-07-10 DIAGNOSIS — I248 Other forms of acute ischemic heart disease: Secondary | ICD-10-CM

## 2022-07-10 LAB — ECHOCARDIOGRAM COMPLETE
AR max vel: 2.62 cm2
AV Peak grad: 9.3 mmHg
Ao pk vel: 1.53 m/s
Area-P 1/2: 3.75 cm2
Height: 72 in
S' Lateral: 3.8 cm
Weight: 2955.2 oz

## 2022-07-10 LAB — CBC
HCT: 35.8 % — ABNORMAL LOW (ref 39.0–52.0)
Hemoglobin: 12.4 g/dL — ABNORMAL LOW (ref 13.0–17.0)
MCH: 31.8 pg (ref 26.0–34.0)
MCHC: 34.6 g/dL (ref 30.0–36.0)
MCV: 91.8 fL (ref 80.0–100.0)
Platelets: 176 10*3/uL (ref 150–400)
RBC: 3.9 MIL/uL — ABNORMAL LOW (ref 4.22–5.81)
RDW: 13.4 % (ref 11.5–15.5)
WBC: 6.4 10*3/uL (ref 4.0–10.5)
nRBC: 0 % (ref 0.0–0.2)

## 2022-07-10 LAB — BASIC METABOLIC PANEL
Anion gap: 6 (ref 5–15)
BUN: 11 mg/dL (ref 8–23)
CO2: 22 mmol/L (ref 22–32)
Calcium: 8.6 mg/dL — ABNORMAL LOW (ref 8.9–10.3)
Chloride: 107 mmol/L (ref 98–111)
Creatinine, Ser: 1.01 mg/dL (ref 0.61–1.24)
GFR, Estimated: >60 mL/min (ref >60)
Glucose, Bld: 87 mg/dL (ref 70–99)
Potassium: 3.9 mmol/L (ref 3.5–5.1)
Sodium: 135 mmol/L (ref 135–145)

## 2022-07-10 LAB — MAGNESIUM: Magnesium: 2.1 mg/dL (ref 1.7–2.4)

## 2022-07-10 LAB — LIPOPROTEIN A (LPA): Lipoprotein (a): 16.4 nmol/L (ref <75.0)

## 2022-07-10 MED ORDER — AMLODIPINE BESYLATE 2.5 MG PO TABS
2.5000 mg | ORAL_TABLET | Freq: Every day | ORAL | 11 refills | Status: DC
  Filled 2022-07-10: qty 30, 30d supply, fill #0

## 2022-07-10 MED ORDER — CLOPIDOGREL BISULFATE 75 MG PO TABS
75.0000 mg | ORAL_TABLET | Freq: Every day | ORAL | 11 refills | Status: DC
  Filled 2022-07-10: qty 30, 30d supply, fill #0

## 2022-07-10 MED ORDER — AMLODIPINE BESYLATE 2.5 MG PO TABS
2.5000 mg | ORAL_TABLET | Freq: Every day | ORAL | Status: DC
  Administered 2022-07-10: 2.5 mg via ORAL
  Filled 2022-07-10: qty 1

## 2022-07-10 MED ORDER — ASPIRIN 81 MG PO TBEC
81.0000 mg | DELAYED_RELEASE_TABLET | Freq: Every day | ORAL | 0 refills | Status: DC
  Filled 2022-07-10: qty 30, 30d supply, fill #0

## 2022-07-10 MED ORDER — POTASSIUM CHLORIDE CRYS ER 20 MEQ PO TBCR
40.0000 meq | EXTENDED_RELEASE_TABLET | Freq: Once | ORAL | Status: AC
Start: 2022-07-10 — End: 2022-07-10
  Administered 2022-07-10: 40 meq via ORAL
  Filled 2022-07-10: qty 2

## 2022-07-10 MED ORDER — SILDENAFIL CITRATE 20 MG PO TABS: 40.0000 mg | ORAL_TABLET | ORAL | Status: AC | PRN

## 2022-07-10 MED ORDER — PANTOPRAZOLE SODIUM 40 MG PO TBEC
40.0000 mg | DELAYED_RELEASE_TABLET | Freq: Every day | ORAL | 11 refills | Status: DC
  Filled 2022-07-10: qty 30, 30d supply, fill #0

## 2022-07-10 MED ORDER — ASPIRIN 81 MG PO TBEC
81.0000 mg | DELAYED_RELEASE_TABLET | Freq: Every day | ORAL | 0 refills | Status: AC
Start: 2022-07-11 — End: 2022-08-10
  Filled 2022-07-10: qty 30, 30d supply, fill #0

## 2022-07-10 NOTE — TOC Benefit Eligibility Note (Signed)
Patient Teacher, English as a foreign language completed.    The patient is currently admitted and upon discharge could be taking Eliquis 5 mg.  The current 30 day co-pay is $47.00.   The patient is insured through [place], [place] Patient Advocate Specialist [place] Patient Advocate Team Direct Number: [phone]  Fax: [phone]

## 2022-07-10 NOTE — Progress Notes (Signed)
Progress Note  Patient Name: Sean Miranda Date of Encounter: 07/10/2022  [facility] Cardiologist: Carlyle Dolly, MD   Subjective   Postop day #1 cardiac catheterization performed by Dr. Burt Knack.  He had DFR of the left main and IVUS guided PCI and drug-eluting stenting along with orbital atherectomy of the ostial RCA.  He has had no chest pain.  Inpatient Medications    Scheduled Meds:  apixaban  5 mg Oral BID   aspirin EC  81 mg Oral Daily   clopidogrel  75 mg Oral Q breakfast   dofetilide  250 mcg Oral BID   pantoprazole  40 mg Oral Daily   pravastatin  20 mg Oral Q M,W,F   sodium chloride flush  3 mL Intravenous Q12H   Continuous Infusions:  sodium chloride     PRN Meds: sodium chloride, acetaminophen, nitroGLYCERIN, ondansetron (ZOFRAN) IV, sodium chloride flush   Vital Signs    Vitals:   07/09/22 2025 07/09/22 2339 07/10/22 0306 07/10/22 0743  BP: (!) 168/49 (!) 154/53 (!) 152/42 (!) 148/51  Pulse: (!) 44 (!) 49 (!) 49 (!) 50  Resp:    19  Temp: 98.5 F (36.9 C) (!) 97.5 F (36.4 C) 97.7 F (36.5 C) (!) 97.3 F (36.3 C)  TempSrc: Oral Oral Oral Oral  SpO2: 97% 98% 97% 98%  Weight:   83.8 kg   Height:        Intake/Output Summary (Last 24 hours) at 07/10/2022 0931 Last data filed at 07/10/2022 0501 Gross per 24 hour  Intake 2074.02 ml  Output --  Net 2074.02 ml      07/10/2022    3:06 AM 07/09/2022   12:30 AM 07/08/2022    6:18 PM  Last 3 Weights  Weight (lbs) 184 lb 11.2 oz 191 lb 12.8 oz 183 lb  Weight (kg) 83.779 kg 87 kg 83.008 kg      Telemetry    Sinus bradycardia 30-50s - Personally Reviewed  ECG    Sinus bradycardia 51 with right bundle branch block and sinus arrhythmia- Personally Reviewed  Physical Exam   GEN: No acute distress.   Neck: No JVD Cardiac: regular rhythm, bradycardic rate Respiratory: Clear to auscultation bilaterally. GI: Soft, nontender, non-distended  MS: No edema; No deformity. Neuro:  Nonfocal  Psych:  Normal affect   Labs    High Sensitivity Troponin:   Recent Labs  Lab 07/08/22 1906 07/08/22 2103  TROPONINIHS 5 16     Chemistry Recent Labs  Lab 07/08/22 1906 07/09/22 0607 07/10/22 0344  NA 130* 135 135  K 3.9 4.2 3.9  CL 102 104 107  CO2 '25 26 22  '$ GLUCOSE 120* 95 87  BUN '20 15 11  '$ CREATININE 1.22 1.08 1.01  CALCIUM 8.7* 8.8* 8.6*  MG  --  2.2 2.1  PROT 6.9 6.3*  --   ALBUMIN 3.7 3.3*  --   AST 20 18  --   ALT 16 15  --   ALKPHOS 64 51  --   BILITOT 0.5 1.0  --   GFRNONAA 56* >60 >60  ANIONGAP 3* 5 6    Lipids  Recent Labs  Lab 07/09/22 0607  CHOL 201*  TRIG 59  HDL 38*  LDLCALC 151*  CHOLHDL 5.3    Hematology Recent Labs  Lab 07/08/22 1906 07/09/22 0607 07/10/22 0344  WBC 6.3 6.1 6.4  RBC 4.00* 3.98* 3.90*  HGB 13.0 12.7* 12.4*  HCT 36.9* 36.4* 35.8*  MCV 92.3 91.5  91.8  MCH 32.5 31.9 31.8  MCHC 35.2 34.9 34.6  RDW 13.3 13.2 13.4  PLT 187 192 176   Thyroid No results for input(s): "TSH", "FREET4" in the last 168 hours.  BNP Recent Labs  Lab 07/09/22 0607  BNP 354.9*    DDimer No results for input(s): "DDIMER" in the last 168 hours.   Radiology    CARDIAC CATHETERIZATION  Result Date: 07/09/2022   LM lesion is 25% stenosed.   Mid LAD-2 lesion is 20% stenosed.   Mid RCA lesion is 50% stenosed.   2nd Diag lesion is 40% stenosed.   Acute Mrg lesion is 75% stenosed.   Dist LM to Ost LAD lesion is 70% stenosed.   Prox LAD lesion is 50% stenosed.   Ost RCA lesion is 90% stenosed.   A drug-eluting stent was successfully placed using a SYNERGY XD 4.0X16.   Post intervention, there is a 0% residual stenosis. 1.  Moderately severe distal left main stenosis with borderline RFR evaluation 2.  Moderate diffuse proximal LAD stenosis with abnormal RFR in the distal vessel and borderline RFR in the mid vessel (mid vessel RFR 0.89 accounting for both the left main and proximal LAD stenosis) 3.  Patent left circumflex with mild diffuse nonobstructive stenosis  4.  Severe calcific ostial RCA stenosis treated successfully with orbital atherectomy and IVUS guided stenting using a 4.0 x 16 mm Synergy DES Recommendations: Aspirin 81 mg daily x 30 days, clopidogrel 75 mg daily times minimum of 6 months, resume apixaban tomorrow morning.  Would not continue triple therapy more than 30 days.  Would treat prophylactically with PPI inhibition while on triple therapy (order for Protonix written).   DG Knee Complete 4 Views Left  Result Date: 07/08/2022 CLINICAL DATA:  Fall, left knee pain EXAM: LEFT KNEE - COMPLETE 4+ VIEW COMPARISON:  None Available. FINDINGS: No definite evidence of fracture, dislocation, or joint effusion. Corticated appearing probable enthesopathic change or heterotopic ossification of the posterior aspect of the left fibular neck. Minimal tricompartmental arthrosis. Soft tissues are unremarkable. IMPRESSION: 1. No acute fracture or dislocation of the left knee. Minimal arthrosis. 2. Corticated appearing probable enthesopathic change or heterotopic ossification of the posterior left aspect of the fibular neck. Consider additional if there is acute point tenderness or other concern for fibular neck fracture. Electronically Signed   By: Delanna Ahmadi M.D.   On: 07/08/2022 19:03   DG Chest 2 View  Result Date: 07/08/2022 CLINICAL DATA:  Left-sided chest pain EXAM: CHEST - 2 VIEW COMPARISON:  07/18/2020 FINDINGS: The heart size and mediastinal contours are within normal limits. Both lungs are clear. The visualized skeletal structures are unremarkable. IMPRESSION: No acute abnormality of the lungs. Electronically Signed   By: Delanna Ahmadi M.D.   On: 07/08/2022 19:01    Cardiac Studies   Echo 09/05/20:  1. Left ventricular ejection fraction, by estimation, is 65 to 70%. The  left ventricle has normal function. The left ventricle has no regional  wall motion abnormalities. Left ventricular diastolic parameters are  indeterminate.   2. Right ventricular  systolic function is normal. The right ventricular  size is normal.   3. Left atrial size was mild to moderately dilated.   4. The mitral valve is normal in structure. No evidence of mitral valve  regurgitation. No evidence of mitral stenosis.   5. The aortic valve was not well visualized. There is mild calcification  of the aortic valve. There is mild thickening of the aortic  valve. Aortic  valve regurgitation is not visualized. No aortic stenosis is present.   6. The inferior vena cava is normal in size with greater than 50%  respiratory variability, suggesting right atrial pressure of 3 mmHg.    Left heart cath  2018 Mid RCA lesion, 50 %stenosed. Ost RCA lesion, 25 %stenosed. Lesion noted in small RV marginal. LM lesion, 25 %stenosed. 2nd Diag lesion, 40 %stenosed. Mid LAD-1 lesion, 20 %stenosed. Mid LAD-2 lesion, 95 %stenosed. A STENT SYNERGY DES 2.5X16 drug eluting stent was successfully placed, postdilated to 3.0 mm. Post intervention, there is a 0% residual stenosis. The left ventricular systolic function is normal. LV end diastolic pressure is moderately elevated. The left ventricular ejection fraction is 55-65% by visual estimate. There is no aortic valve stenosis.   LAD was the culprit for his unstable angina. He had symptoms during the procedure at rest. These resolved after stent placement. Watch overnight. Continue IV Angiomax for another hour.   Loaded with Plavix today. We'll start 75 mg daily tomorrow. Given aspirin today as well. Would plan for aspirin, Plavix and Eliquis for 30 days. After 30 days, would stop the aspirin and continue Plavix and Eliquis. A synergy drug-eluting stent was used since he is on long-term anticoagulation. If there is a bleeding complication, his antiplatelet therapy could be stopped sooner than usual. Ideally, he would get 6-12 months of Plavix depending on his bleeding risk profile.   Possible discharge tomorrow if he does well tonight and  with cardiac rehabilitation tomorrow.  Cardiac catheterization, PCI drug-eluting stent (07/09/2022)  Conclusion      LM lesion is 25% stenosed.   Mid LAD-2 lesion is 20% stenosed.   Mid RCA lesion is 50% stenosed.   2nd Diag lesion is 40% stenosed.   Acute Mrg lesion is 75% stenosed.   Dist LM to Ost LAD lesion is 70% stenosed.   Prox LAD lesion is 50% stenosed.   Ost RCA lesion is 90% stenosed.   A drug-eluting stent was successfully placed using a SYNERGY XD 4.0X16.   Post intervention, there is a 0% residual stenosis.   1.  Moderately severe distal left main stenosis with borderline RFR evaluation 2.  Moderate diffuse proximal LAD stenosis with abnormal RFR in the distal vessel and borderline RFR in the mid vessel (mid vessel RFR 0.89 accounting for both the left main and proximal LAD stenosis) 3.  Patent left circumflex with mild diffuse nonobstructive stenosis 4.  Severe calcific ostial RCA stenosis treated successfully with orbital atherectomy and IVUS guided stenting using a 4.0 x 16 mm Synergy DES   Recommendations: Aspirin 81 mg daily x 30 days, clopidogrel 75 mg daily times minimum of 6 months, resume apixaban tomorrow morning.  Would not continue triple therapy more than 30 days.  Would treat prophylactically with PPI inhibition while on triple therapy (order for Protonix written).   Coronary Diagrams  Diagnostic Dominance: Right  Intervention   Patient Profile     88 y.o. male persistent afib, on flecainide and Eliquis, R-CEA, HLD, OA, RBBB & LAFB, CAD wtih DES to mLAD 2018, normal NST 2020.  Assessment & Plan    1: CAD-status post PCI and drug-eluting stenting of the LAD in 2018.  Patient presented with chest pain.  Troponins were negative.  Cardiac catheterization revealed a 80% distal left main, patent LAD stent.  DFR of left main was borderline.  Patient had high-grade calcified ostial dominant RCA stenosis.  He underwent orbital atherectomy, IVUS guided PCI  drug-eluting stenting.  The stent was postdilated to 4.5 mm with an excellent result.  His right radial puncture site is stable as is his right common femoral vein puncture site.  He is on DAPT.  He has had no further chest pain.  He will need to be on triple therapy for 30 days after which aspirin can be discontinued.  2: Paroxysmal A-fib-on Tikosyn and Eliquis.  He is on metoprolol as well.  He has sinus rhythm with a slow ventricular response despite being off his beta-blocker.  Will continue to hold his metoprolol, restart Eliquis.  I am going to get a 2-week Zio patch to further evaluate his heart rate.  3: Essential hypertension-blood pressure 148/51.  Will hold metoprolol because of heart rate and start low-dose amlodipine  4: Hyperlipidemia-on low-dose pravastatin  Patient stable for discharge.  A 2D echo was ordered.  Will wait for it to be resulted.  Will arrange TOC 7 follow-up with Dr. Carlyle Dolly as an outpatient.   Lorretta Harp, M.D., [place], [facility], [place] [address] [place], [place]  [address]  [phone] 07/10/2022 9:38 AM

## 2022-07-10 NOTE — Progress Notes (Deleted)
Clinical Summary [name] is a 88 y.o.male  seen today for follow up of the following medical problems.    1.Chest pain - admit      1. Afib - appears diagnosed around 01/2017 during admission. Started on eliquis and lopressor 12.'5mg'$  bid. Due to continued dyspnea attempts were made to get him back into NSR, including multiple cardioversions and starting flecanide. Had tolerated lopressor at the time but stopped taking due to HRs in 40s or 50s, had been on 12.'5mg'$  bid at the time.    - was on flecanide previously, discontued after diagnosed with CAD  We tried lopressor 12.'5mg'$  bid last visit but caused fatigue and he stopped taking. Heart rates checked multiple times a day, usually 50s-60s.    - - no recent palpitations - no bleeding on eliquis.      - seen in ER with palpitations and elevated HR to 90s - EKG showed rate controlled afib   HRs at home variable, 80s to 100s. Higher than his normal of 60s at rest.  - some fluttering at times. Some fatigue.     - followed by afib clinic/EP - started on dofetilide 09/2020 with DCCV at that time - recurrent afib noted at 10/2020 f/u. Repeat DCCV was planned 11/2020 but cancelled as pt was back in SR. - at last EP appt 12/3 plan was to continue dofetilide   - no recent afib symptoms - compliant with meds. No bleeding on eliquis.      - low HRs, have lowered his beta blocker over time.    2. CAD - admit 05/2017 with chest pain - 05/2017 cath as reported below, received a DES to LAD - 05/2017 echo LVEF 60-65%, no WMAs   - seen in ER 10/2019 with chest pain - trop neg x 1. EKG afib without acute ischemic changes. CXR no acute process    10/2019 nuclear stress no ischemia - no significant symptoms.        3. Carotid stenosis - history of right CEA - 01/2018 carotid US RICA 1-39% and patent CEA, left 1-39%. Annual checks   - followed by vascular. Has appt next month with repeat US   4. Hyperlipidemia - intolerant to  statin, has been on low dose pravastatin.  - Dr Debara Pickett had discussed repatha, however patient declinded due to concern about too low cholesterol - 05/2018 TC 195 HDL 39 TG 73 LDL 141   - taking pravastatin '40mg'$  MWF - has been resistant to repatha.     10/2019 TC 243 HDL 44 LDL 179. He was off statin at that time but has since restarted - most recent labs with pcp         Has had covid vaccine x3.      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 Son in Sports coach is [employer]. 2 sons and daughter are involved in family business       Past Medical History:  Diagnosis Date   Arthritis    neck    Atrial fibrillation ([place])    a. new-onset in 01/2017. Started on Eliquis   CAD (coronary artery disease), native coronary artery 05/16/2017   LAD 95%>>0 w/ 2.5 x 16 mm Synergy DES, D2 40%, RCA 50%, EF 55-65%, LVEDP mod elevated   Calcification of coronary artery    a. mild, nonobstructive CAD by cath in 2009.  Cancer of skin of chest    Carotid artery occlusion    a. s/p R CEA in 2014   Colon polyps    Hyperlipidemia    takes Pravastatin every other day   Pneumonia ~ 01/2013     Allergies  Allergen Reactions   Lipitor [Atorvastatin Calcium] Other (See Comments)    Muscle pain   Vantin [Cefpodoxime] Rash     No current facility-administered medications for this visit.   No current outpatient medications on file.   Facility-Administered Medications Ordered in Other Visits  Medication Dose Route Frequency Provider Last Rate Last Admin   0.9 %  sodium chloride infusion  250 mL Intravenous PRN Sherren Mocha, MD       acetaminophen (TYLENOL) tablet 650 mg  650 mg Oral Q4H PRN Sherren Mocha, MD       apixaban [facility]) tablet 5 mg  5 mg Oral BID Sherren Mocha, MD       aspirin EC tablet 81 mg  81 mg Oral Daily Sherren Mocha, MD       clopidogrel (PLAVIX) tablet 75 mg  75 mg Oral Q  breakfast Sherren Mocha, MD       dofetilide [facility]) capsule 250 mcg  250 mcg Oral BID Sherren Mocha, MD   250 mcg at 07/09/22 2028   nitroGLYCERIN (NITROSTAT) SL tablet 0.4 mg  0.4 mg Sublingual Q5 min PRN Sherren Mocha, MD       ondansetron [facility]) injection 4 mg  4 mg Intravenous Q6H PRN Sherren Mocha, MD       pantoprazole (PROTONIX) EC tablet 40 mg  40 mg Oral Daily Sherren Mocha, MD   40 mg at 07/09/22 1833   potassium chloride SA (KLOR-CON M) CR tablet 40 mEq  40 mEq Oral Once Duke, Angela Nicole, PA       pravastatin (PRAVACHOL) tablet 20 mg  20 mg Oral Q M,W,F Sherren Mocha, MD   20 mg at 07/09/22 1038   sodium chloride flush (NS) 0.9 % injection 3 mL  3 mL Intravenous Q12H Sherren Mocha, MD       sodium chloride flush (NS) 0.9 % injection 3 mL  3 mL Intravenous PRN Sherren Mocha, MD         Past Surgical History:  Procedure Laterality Date   CARDIAC CATHETERIZATION  06/09/1999   mild CAD (LAD, diagonal, RCA) - Dr. Rockne Menghini   CARDIOVERSION N/A 03/25/2017   Procedure: CARDIOVERSION;  Surgeon: Pixie Casino, MD;  Location: [place];  Service: Cardiovascular;  Laterality: N/A;   CARDIOVERSION N/A 09/28/2020   Procedure: CARDIOVERSION;  Surgeon: [name], MD;  Location: [place];  Service: Cardiovascular;  Laterality: N/A;   CAROTID DOPPLER  02/2012   60-79% RICA stenosis, 61-95% LICA stenosis (prior to carotid endarterectomy)   COLONOSCOPY     CORONARY ATHERECTOMY N/A 07/09/2022   Procedure: CORONARY ATHERECTOMY;  Surgeon: Sherren Mocha, MD;  Location: [place] [facility];  Service: Cardiovascular;  Laterality: N/A;   CORONARY STENT INTERVENTION N/A 05/16/2017   Procedure: Coronary Stent Intervention;  Surgeon: Jettie Booze, MD;  Location: [place] [facility];  Service: Cardiovascular;  Laterality: N/A;   CORONARY STENT INTERVENTION N/A 07/09/2022   Procedure: CORONARY STENT INTERVENTION;  Surgeon: Sherren Mocha, MD;  Location: [place] [facility];  Service: Cardiovascular;  Laterality: N/A;   ENDARTERECTOMY Right 11/10/2013   Procedure: ENDARTERECTOMY CAROTID;  Surgeon: Conrad Allen, MD;  Location: [place];  Service: Vascular;  Laterality: Right;  INGUINAL HERNIA REPAIR Left    INTRAVASCULAR PRESSURE WIRE/FFR STUDY N/A 07/09/2022   Procedure: INTRAVASCULAR PRESSURE WIRE/FFR STUDY;  Surgeon: Sherren Mocha, MD;  Location: [place] [facility];  Service: Cardiovascular;  Laterality: N/A;   INTRAVASCULAR ULTRASOUND/IVUS N/A 07/09/2022   Procedure: Intravascular Ultrasound/IVUS;  Surgeon: Sherren Mocha, MD;  Location: [facility] [facility];  Service: Cardiovascular;  Laterality: N/A;   LEFT HEART CATH AND CORONARY ANGIOGRAPHY N/A 05/16/2017   Procedure: Left Heart Cath and Coronary Angiography;  Surgeon: Jettie Booze, MD;  Location: [place] [facility];  Service: Cardiovascular;  Laterality: N/A;   LEFT HEART CATH AND CORONARY ANGIOGRAPHY N/A 07/09/2022   Procedure: LEFT HEART CATH AND CORONARY ANGIOGRAPHY;  Surgeon: Sherren Mocha, MD;  Location: [place] [facility];  Service: Cardiovascular;  Laterality: N/A;   MOLE REMOVAL  1990s   "chest"   NM MYOCAR PERF WALL MOTION  04/2011   bruce myoview - perfusion defect in inferior myocardium (diaphragmatic attenuation), remaining myocardium with normal perfusion, EF 67%   PATCH ANGIOPLASTY Right 11/10/2013   Procedure: PATCH ANGIOPLASTY;  Surgeon: Conrad , MD;  Location: [place];  Service: Vascular;  Laterality: Right;   PILONIDAL CYST DRAINAGE     SHOULDER OPEN ROTATOR CUFF REPAIR Bilateral [place]  ~ 2015   "chest"   TEMPORARY PACEMAKER N/A 07/09/2022   Procedure: TEMPORARY PACEMAKER;  Surgeon: Sherren Mocha, MD;  Location: [place] [facility];  Service: Cardiovascular;  Laterality: N/A;   TONSILLECTOMY       Allergies  Allergen Reactions   Lipitor [Atorvastatin Calcium] Other (See Comments)    Muscle pain   Vantin [Cefpodoxime] Rash      Family  History  Problem Relation Age of Onset   Stroke Father    Hypertension Mother    Prostate cancer Son    Stroke Brother    Cancer Brother        spine     Social History Mr. [place] reports that he quit smoking about 49 years ago. His smoking use included cigarettes. He has a 18.00 pack-year smoking history. He has never used smokeless tobacco. Mr. Krantz reports current alcohol use of about 14.0 standard drinks of alcohol per week.   Review of Systems CONSTITUTIONAL: No weight loss, fever, chills, weakness or fatigue.  HEENT: Eyes: No visual loss, blurred vision, double vision or yellow sclerae.No hearing loss, sneezing, congestion, runny nose or sore throat.  SKIN: No rash or itching.  CARDIOVASCULAR:  RESPIRATORY: No shortness of breath, cough or sputum.  GASTROINTESTINAL: No anorexia, nausea, vomiting or diarrhea. No abdominal pain or blood.  GENITOURINARY: No burning on urination, no polyuria NEUROLOGICAL: No headache, dizziness, syncope, paralysis, ataxia, numbness or tingling in the extremities. No change in bowel or bladder control.  MUSCULOSKELETAL: No muscle, back pain, joint pain or stiffness.  LYMPHATICS: No enlarged nodes. No history of splenectomy.  PSYCHIATRIC: No history of depression or anxiety.  ENDOCRINOLOGIC: No reports of sweating, cold or heat intolerance. No polyuria or polydipsia.  Marland Kitchen   Physical Examination There were no vitals filed for this visit. There were no vitals filed for this visit.  Gen: resting comfortably, no acute distress HEENT: no scleral icterus, pupils equal round and reactive, no palptable cervical adenopathy,  CV Resp: Clear to auscultation bilaterally GI: abdomen is soft, non-tender, non-distended, normal bowel sounds, no hepatosplenomegaly MSK: extremities are warm, no edema.  Skin: warm, no rash Neuro:  no focal deficits Psych: appropriate  affect   Diagnostic Studies  05/2017 cath   Mid RCA lesion, 50 %stenosed. Ost RCA  lesion, 25 %stenosed. Lesion noted in small RV marginal. LM lesion, 25 %stenosed. 2nd Diag lesion, 40 %stenosed. Mid LAD-1 lesion, 20 %stenosed. Mid LAD-2 lesion, 95 %stenosed. A STENT SYNERGY DES 2.5X16 drug eluting stent was successfully placed, postdilated to 3.0 mm. Post intervention, there is a 0% residual stenosis. The left ventricular systolic function is normal. LV end diastolic pressure is moderately elevated. The left ventricular ejection fraction is 55-65% by visual estimate. There is no aortic valve stenosis.   LAD was the culprit for his unstable angina. He had symptoms during the procedure at rest. These resolved after stent placement. Watch overnight. Continue IV Angiomax for another hour.   Loaded with Plavix today. We'll start 75 mg daily tomorrow. Given aspirin today as well. Would plan for aspirin, Plavix and Eliquis for 30 days. After 30 days, would stop the aspirin and continue Plavix and Eliquis. A synergy drug-eluting stent was used since he is on long-term anticoagulation. If there is a bleeding complication, his antiplatelet therapy could be stopped sooner than usual. Ideally, he would get 6-12 months of Plavix depending on his bleeding risk profile.   Possible discharge tomorrow if he does well tonight and with cardiac rehabilitation tomorrow.     05/2017 echo Study Conclusions   - Left ventricle: The cavity size was normal. Wall thickness was   increased in a pattern of mild LVH. Systolic function was normal.   The estimated ejection fraction was in the range of 60% to 65%.   Wall motion was normal; there were no regional wall motion   abnormalities. The study is not technically sufficient to allow   evaluation of LV diastolic function. - Aortic valve: Mildly calcified annulus. Trileaflet; moderately   thickened leaflets. Valve area (VTI): 2.98 cm^2. Valve area   (Vmax): 2.7 cm^2. Valve area (Vmean): 2.56 cm^2. - Left atrium: The atrium was mildly dilated. -  Technically adequate study.     10/2019 nuclear stress Unable to achieve adequate heart rate response with GXT, Lexiscan utilized. No diagnostic ST segment changes. Small, mild intensity, inferior/inferoseptal defect extending from apex to base that is fixed (actually more prominent at rest) and consistent with soft tissue attenuation. No reversible defects to indicate ischemia. This is a low risk study. Nuclear stress EF: 69%.     Assessment and Plan   1. Afib - doing well on dofetilide, continue. - continue anticoag   2. CAD - no significant symptoms, relatively recent stress test was benign.  - continue to monitor   3. Hyperlipidemia - limited in statin tolerance - request labs from pcp     Arnoldo Lenis, M.D., F.A.C.C.

## 2022-07-10 NOTE — Telephone Encounter (Signed)
Pharmacy Patient Advocate Encounter  Insurance verification completed.    The patient is insured through [employer] Medicare Part D   The patient is currently admitted and ran test claims for the following: Eliquis .  Copays and coinsurance results were relayed to Inpatient clinical team.  

## 2022-07-10 NOTE — Progress Notes (Signed)
ZIO AT applied at hospital Dr. Zandra Abts to read.

## 2022-07-10 NOTE — Progress Notes (Signed)
CARDIAC REHAB PHASE I   PRE:  Rate/Rhythm: 60/ afib  BP:  Supine: 162/48   SaO2: 100  MODE:  Ambulation: 470 ft   POST:  Rate/Rhythem: 54/ afib  BP:  Sitting: 149/67   SaO2: 100  Pt tolerated exercise well with no s/s. Pt educated on ex guidelines, site care, heart healthy diet, CRP2, ASA, and plavix. All questions from family and pt answered. CRP2 referral to AP.  [place], MS 07/10/2022 10:22 AM

## 2022-07-10 NOTE — Discharge Summary (Addendum)
Discharge Summary    Patient ID: KALEB SEK MRN: [id number]; DOB: 1930/02/12  Admit date: 07/08/2022 Discharge date: 07/10/2022  PCP:  Billie Ruddy, MD   [facility] Providers Cardiologist:  Carlyle Dolly, MD   Discharge Diagnoses    Principal Problem:   Unstable angina [facility]) Active Problems:   Hyperlipidemia   Carotid artery disease [facility])   Essential hypertension   Atrial fibrillation ([place])   S/P primary angioplasty with coronary stent   History of right-sided carotid endarterectomy   Diagnostic Studies/Procedures    Left heart cath 07/09/22:   LM lesion is 25% stenosed.   Mid LAD-2 lesion is 20% stenosed.   Mid RCA lesion is 50% stenosed.   2nd Diag lesion is 40% stenosed.   Acute Mrg lesion is 75% stenosed.   Dist LM to Ost LAD lesion is 70% stenosed.   Prox LAD lesion is 50% stenosed.   Ost RCA lesion is 90% stenosed.   A drug-eluting stent was successfully placed using a SYNERGY XD 4.0X16.   Post intervention, there is a 0% residual stenosis.   1.  Moderately severe distal left main stenosis with borderline RFR evaluation 2.  Moderate diffuse proximal LAD stenosis with abnormal RFR in the distal vessel and borderline RFR in the mid vessel (mid vessel RFR 0.89 accounting for both the left main and proximal LAD stenosis) 3.  Patent left circumflex with mild diffuse nonobstructive stenosis 4.  Severe calcific ostial RCA stenosis treated successfully with orbital atherectomy and IVUS guided stenting using a 4.0 x 16 mm Synergy DES   Recommendations: Aspirin 81 mg daily x 30 days, clopidogrel 75 mg daily times minimum of 6 months, resume apixaban tomorrow morning.  Would not continue triple therapy more than 30 days.  Would treat prophylactically with PPI inhibition while on triple therapy (order for Protonix written).       Diagnostic Dominance: Right  Intervention      _____________   Echo 07/10/22:  1. Left ventricular ejection fraction, by  estimation, is 55 to 60%. The  left ventricle has normal function. The left ventricle has no regional  wall motion abnormalities. Left ventricular diastolic parameters are  consistent with Grade II diastolic  dysfunction (pseudonormalization). Elevated left atrial pressure.   2. Right ventricular systolic function is normal. The right ventricular  size is normal.   3. Left atrial size was mildly dilated.   4. The mitral valve is normal in structure. No evidence of mitral valve  regurgitation.   5. The aortic valve was not well visualized. There is mild calcification  of the aortic valve. Aortic valve regurgitation is not visualized. No  aortic stenosis is present.   6. The inferior vena cava is dilated in size with <50% respiratory  variability, suggesting right atrial pressure of 15 mmHg.    History of Present Illness     MANSON LUCKADOO is a 88 y.o. male with HTN, HLD, CAD, A-fib on apixaban and tikosyn, who presents with chest pain that started around 6 PM yesterday evening when he was outside gathering trash to take to his trash bin.  He had sudden onset chest pain that was crushing and left-sided in nature without any associated shortness of breath, diaphoresis, nausea, or vomiting.  States that the pain was persistent and lasted for about 10 to 15 minutes and he presented immediately to the emergency department afterwards.  Initial EKG at the outside emergency department with only some subtle ST and T wave changes  and initial troponin was 5.  However the patient had recurrent chest pain in the emergency department and repeat ECG with very notable changes including elevation in aVR, and global ST depression.  Repeat troponin was 16.   Notably, he is very active overall and works out every other day.  Former smoker quit in 1974.  He has known coronary disease with prior PCI in 2018 and a subsequent normal stress test in 2020.  In 2021 with normal function.   On arrival to [facility], he is  stable and without chest pain.  He is on heparin for anticoagulation.  He has no acute complaints at this time.  Hospital Course     Consultants: none  Unstable angina NSTEMI CAD s/p DES-LAD (2018) He underwent left heart catheterization which revealed 80% distal left main diseas with patent LAD stent. DFR of left main was borderline. He had high grade calcified ostial RCA stenosis. Orbital atherectomy, IVUS guided PCI with DES to proximal RCA. Left main will be treated medically.   Triple therapy: ASA x 30 days Plavix x at least 6 months Resume eliquis this morning Protonix while on triple therapy.   Paroxysmal atrial fibrillation Sinus bradycardia Chronic anticoagulation Maintained on tikosyn, metoprolol, and eliquis On arrival, sinus bradycardia in the 30s. BB held. Today, sinus bradycardia in the 40-50s. Will place a 2 week zio monitor.    Hypertension Holding BB Start 2.5 mg amlodipine   Hyperlipidemia with LDL goal < 70, may consider less than 55 given left main disease On low dose pravastatin 07/09/2022: Cholesterol 201; HDL 38; LDL Cholesterol 151; Triglycerides 59; VLDL 12 Will refer to lipid clinic   Echocardiogram with normal LVEF, grade 2 DD, no RWMA.   Pt seen and examined by Dr. Gwenlyn Found and deemed stable for discharge. Follow up has been arranged.    Did the patient have an acute coronary syndrome (MI, NSTEMI, STEMI, etc) this admission?:  No                               Did the patient have a percutaneous coronary intervention (stent / angioplasty)?:  Yes.     Cath/PCI Registry Performance & Quality Measures: Aspirin prescribed? - Yes ADP Receptor Inhibitor (Plavix/Clopidogrel, Brilinta/Ticagrelor or Effient/Prasugrel) prescribed (includes medically managed patients)? - Yes High Intensity Statin (Lipitor 40-'80mg'$  or Crestor 20-'40mg'$ ) prescribed? - No - statin intolerant, referral to lipid clinic For EF <40%, was ACEI/ARB prescribed? - Not Applicable (EF >/=  32%) For EF <40%, Aldosterone Antagonist (Spironolactone or Eplerenone) prescribed? - Not Applicable (EF >/= 20%) Cardiac Rehab Phase II ordered? - Yes       The patient will be scheduled for a TOC follow up appointment in 7-14 days.  A message has been sent to the [facility] and Scheduling Pool at the office where the patient should be seen for follow up.  _____________  Discharge Vitals Blood pressure (!) 139/49, pulse (!) 52, temperature 97.7 F (36.5 C), temperature source Oral, resp. rate 17, height 6' (1.829 m), weight 83.8 kg, SpO2 97 %.  Filed Weights   07/08/22 1818 07/09/22 0030 07/10/22 0306  Weight: 83 kg 87 kg 83.8 kg    Labs & Radiologic Studies    CBC Recent Labs    07/08/22 1906 07/09/22 0607 07/10/22 0344  WBC 6.3 6.1 6.4  NEUTROABS 3.2  --   --   HGB 13.0 12.7* 12.4*  HCT 36.9* 36.4* 35.8*  MCV 92.3 91.5 91.8  PLT 187 192 169   Basic Metabolic Panel Recent Labs    07/09/22 0607 07/10/22 0344  NA 135 135  K 4.2 3.9  CL 104 107  CO2 26 22  GLUCOSE 95 87  BUN 15 11  CREATININE 1.08 1.01  CALCIUM 8.8* 8.6*  MG 2.2 2.1   Liver Function Tests Recent Labs    07/08/22 1906 07/09/22 0607  AST 20 18  ALT 16 15  ALKPHOS 64 51  BILITOT 0.5 1.0  PROT 6.9 6.3*  ALBUMIN 3.7 3.3*   No results for input(s): "LIPASE", "AMYLASE" in the last 72 hours. High Sensitivity Troponin:   Recent Labs  Lab 07/08/22 1906 07/08/22 2103  TROPONINIHS 5 16    BNP Invalid input(s): "POCBNP" D-Dimer No results for input(s): "DDIMER" in the last 72 hours. Hemoglobin A1C Recent Labs    07/09/22 0607  HGBA1C 5.1   Fasting Lipid Panel Recent Labs    07/09/22 0607  CHOL 201*  HDL 38*  LDLCALC 151*  TRIG 59  CHOLHDL 5.3   Thyroid Function Tests No results for input(s): "TSH", "T4TOTAL", "T3FREE", "THYROIDAB" in the last 72 hours.  Invalid input(s): "FREET3" _____________  ECHOCARDIOGRAM COMPLETE  Result Date: 07/10/2022    ECHOCARDIOGRAM REPORT    Patient Name:   CLOUD GRAHAM Violette Date of Exam: 07/10/2022 Medical Rec #:  [id number]       Height:       72.0 in Accession #:    [id number]      Weight:       184.7 lb Date of Birth:  [birth year]       BSA:          2.060 m Patient Age:    88 years        BP:           149/67 mmHg Patient Gender: M               HR:           57 bpm. Exam Location:  Inpatient Procedure: 2D Echo, Cardiac Doppler and Color Doppler Indications:    Acute ischemic heart disease  History:        Patient has prior history of Echocardiogram examinations, most                 recent 09/05/2020. CAD; Risk Factors:Dyslipidemia.  Sonographer:    Jefferey Pica Referring Phys: [place]  1. Left ventricular ejection fraction, by estimation, is 55 to 60%. The left ventricle has normal function. The left ventricle has no regional wall motion abnormalities. Left ventricular diastolic parameters are consistent with Grade II diastolic dysfunction (pseudonormalization). Elevated left atrial pressure.  2. Right ventricular systolic function is normal. The right ventricular size is normal.  3. Left atrial size was mildly dilated.  4. The mitral valve is normal in structure. No evidence of mitral valve regurgitation.  5. The aortic valve was not well visualized. There is mild calcification of the aortic valve. Aortic valve regurgitation is not visualized. No aortic stenosis is present.  6. The inferior vena cava is dilated in size with <50% respiratory variability, suggesting right atrial pressure of 15 mmHg. Comparison(s): No significant change from prior study. Prior images reviewed side by side. FINDINGS  Left Ventricle: Left ventricular ejection fraction, by estimation, is 55 to 60%. The left ventricle has normal function. The left ventricle has no regional wall motion abnormalities. The left ventricular internal cavity  size was normal in size. There is  no left ventricular hypertrophy. Left ventricular diastolic parameters are  consistent with Grade II diastolic dysfunction (pseudonormalization). Elevated left atrial pressure. Right Ventricle: The right ventricular size is normal. No increase in right ventricular wall thickness. Right ventricular systolic function is normal. Left Atrium: Left atrial size was mildly dilated. Right Atrium: Right atrial size was normal in size. Pericardium: There is no evidence of pericardial effusion. Mitral Valve: The mitral valve is normal in structure. No evidence of mitral valve regurgitation. Tricuspid Valve: The tricuspid valve is normal in structure. Tricuspid valve regurgitation is not demonstrated. Aortic Valve: The aortic valve was not well visualized. There is mild calcification of the aortic valve. Aortic valve regurgitation is not visualized. No aortic stenosis is present. Aortic valve peak gradient measures 9.3 mmHg. Pulmonic Valve: The pulmonic valve was grossly normal. Pulmonic valve regurgitation is not visualized. Aorta: The aortic root and ascending aorta are structurally normal, with no evidence of dilitation. Venous: The inferior vena cava is dilated in size with less than 50% respiratory variability, suggesting right atrial pressure of 15 mmHg. IAS/Shunts: No atrial level shunt detected by color flow Doppler.  LEFT VENTRICLE PLAX 2D LVIDd:         5.40 cm   Diastology LVIDs:         3.80 cm   LV e' medial:    4.80 cm/s LV PW:         1.10 cm   LV E/e' medial:  20.9 LV IVS:        1.10 cm   LV e' lateral:   7.20 cm/s LVOT diam:     2.00 cm   LV E/e' lateral: 13.9 LV SV:         95 LV SV Index:   46 LVOT Area:     3.14 cm  IVC IVC diam: 2.80 cm LEFT ATRIUM             Index        RIGHT ATRIUM           Index LA diam:        3.80 cm 1.84 cm/m   RA Area:     17.00 cm LA Vol (A2C):   46.9 ml 22.77 ml/m  RA Volume:   46.70 ml  22.67 ml/m LA Vol (A4C):   55.5 ml 26.95 ml/m LA Biplane Vol: 53.6 ml 26.02 ml/m  AORTIC VALVE                 PULMONIC VALVE AV Area (Vmax): 2.62 cm     PV  Vmax:       0.81 m/s AV Vmax:        152.50 cm/s  PV Peak grad:  2.6 mmHg AV Peak Grad:   9.3 mmHg LVOT Vmax:      127.00 cm/s LVOT Vmean:     70.400 cm/s LVOT VTI:       0.301 m  AORTA Ao Root diam: 3.50 cm Ao Asc diam:  3.70 cm MITRAL VALVE MV Area (PHT): 3.75 cm     SHUNTS MV Decel Time: 202 msec     Systemic VTI:  0.30 m MV E velocity: 100.40 cm/s  Systemic Diam: 2.00 cm MV A velocity: 75.50 cm/s MV E/A ratio:  1.33 Mihai Croitoru MD Electronically signed by Sanda Klein MD Signature Date/Time: 07/10/2022/12:53:31 PM    Final    CARDIAC CATHETERIZATION  Result Date: 07/09/2022   LM lesion  is 25% stenosed.   Mid LAD-2 lesion is 20% stenosed.   Mid RCA lesion is 50% stenosed.   2nd Diag lesion is 40% stenosed.   Acute Mrg lesion is 75% stenosed.   Dist LM to Ost LAD lesion is 70% stenosed.   Prox LAD lesion is 50% stenosed.   Ost RCA lesion is 90% stenosed.   A drug-eluting stent was successfully placed using a SYNERGY XD 4.0X16.   Post intervention, there is a 0% residual stenosis. 1.  Moderately severe distal left main stenosis with borderline RFR evaluation 2.  Moderate diffuse proximal LAD stenosis with abnormal RFR in the distal vessel and borderline RFR in the mid vessel (mid vessel RFR 0.89 accounting for both the left main and proximal LAD stenosis) 3.  Patent left circumflex with mild diffuse nonobstructive stenosis 4.  Severe calcific ostial RCA stenosis treated successfully with orbital atherectomy and IVUS guided stenting using a 4.0 x 16 mm Synergy DES Recommendations: Aspirin 81 mg daily x 30 days, clopidogrel 75 mg daily times minimum of 6 months, resume apixaban tomorrow morning.  Would not continue triple therapy more than 30 days.  Would treat prophylactically with PPI inhibition while on triple therapy (order for Protonix written).   DG Knee Complete 4 Views Left  Result Date: 07/08/2022 CLINICAL DATA:  Fall, left knee pain EXAM: LEFT KNEE - COMPLETE 4+ VIEW COMPARISON:  None Available.  FINDINGS: No definite evidence of fracture, dislocation, or joint effusion. Corticated appearing probable enthesopathic change or heterotopic ossification of the posterior aspect of the left fibular neck. Minimal tricompartmental arthrosis. Soft tissues are unremarkable. IMPRESSION: 1. No acute fracture or dislocation of the left knee. Minimal arthrosis. 2. Corticated appearing probable enthesopathic change or heterotopic ossification of the posterior left aspect of the fibular neck. Consider additional if there is acute point tenderness or other concern for fibular neck fracture. Electronically Signed   By: Delanna Ahmadi M.D.   On: 07/08/2022 19:03   DG Chest 2 View  Result Date: 07/08/2022 CLINICAL DATA:  Left-sided chest pain EXAM: CHEST - 2 VIEW COMPARISON:  07/18/2020 FINDINGS: The heart size and mediastinal contours are within normal limits. Both lungs are clear. The visualized skeletal structures are unremarkable. IMPRESSION: No acute abnormality of the lungs. Electronically Signed   By: Delanna Ahmadi M.D.   On: 07/08/2022 19:01    Disposition   Pt is being discharged home today in good condition.  Follow-up Plans & Appointments     Follow-up Information     Elgie Collard, PA-C Follow up on 07/16/2022.   Specialty: Cardiothoracic Surgery Why: 2:20 pm for hospital follow up Contact information: [address] [place] [address] [phone]                Discharge Instructions     AMB Referral to Advanced Lipid Disorders Clinic   Complete by: As directed    Internal Lipid Clinic Referral Scheduling  Internal lipid clinic referrals are providers within [facility], who wish to refer established patients for routine management (help in starting PCSK9 inhibitor therapy) or advanced therapies.  Internal MD referral criteria:              1. All patients with LDL>190 mg/dL  2. All patients with Triglycerides >500 mg/dL  3. Patients with suspected or confirmed  heterozygous familial hyperlipidemia (HeFH) or homozygous familial hyperlipidemia (HoFH)  4. Patients with family history of suspicious for genetic dyslipidemia desiring genetic testing  5. Patients  refractory to standard guideline based therapy  6. Patients with statin intolerance (failed 2 statins, one of which must be a high potency statin)  7. Patients who the provider desires to be seen by MD   Internal PharmD referral criteria:   1. Follow-up patients for medication management  2. Follow-up for compliance monitoring  3. Patients for drug education  4. Patients with statin intolerance  5. PCSK9 inhibitor education and prior authorization approvals  6. Patients with triglycerides <500 mg/dL  External Lipid Clinic Referral  External lipid clinic referrals are for providers outside of [facility], considered new clinic patients - automatically routed to MD schedule   Amb Referral to Cardiac Rehabilitation   Complete by: As directed    Diagnosis: Coronary Stents   After initial evaluation and assessments completed: Virtual Based Care may be provided alone or in conjunction with Phase 2 Cardiac Rehab based on patient barriers.: Yes       Discharge Medications   Allergies as of 07/10/2022       Reactions   Lipitor [atorvastatin Calcium] Other (See Comments)   Muscle pain   Vantin [cefpodoxime] Rash        Medication List     STOP taking these medications    donepezil 5 MG tablet Commonly known as: ARICEPT   metoprolol succinate 25 MG 24 hr tablet Commonly known as: Toprol XL       TAKE these medications    ALPHA LIPOIC ACID PO Take 250 mg by mouth daily.   amLODipine 2.5 MG tablet Commonly known as: NORVASC Take 1 tablet (2.5 mg total) by mouth daily.   aspirin EC 81 MG tablet Take 1 tablet (81 mg total) by mouth daily. Swallow whole. Start taking on: July 11, 2022   b complex vitamins capsule Take 1 capsule by mouth every other day.   cetirizine  10 MG tablet Commonly known as: ZYRTEC Take 10 mg by mouth daily.   clopidogrel 75 MG tablet Commonly known as: PLAVIX Take 1 tablet (75 mg total) by mouth daily with breakfast. Start taking on: July 11, 2022   CoQ10 400 MG Caps Take 800 mg by mouth in the morning and at bedtime.   DHEA 25 MG Caps Take 25 mg by mouth daily.   dofetilide 250 MCG capsule Commonly known as: TIKOSYN TAKE (1) CAPSULE BY MOUTH TWICE DAILY. What changed: See the new instructions.   Eliquis 5 MG Tabs tablet Generic drug: apixaban TAKE (1) TABLET BY MOUTH TWICE DAILY. What changed: See the new instructions.   Glucosamine-Chondroitin 500-400 MG Caps Take 500 mg by mouth daily.   L-Arginine 1000 MG Tabs Take 1,000 mg by mouth daily.   Lecithin 1200 MG Caps Take 1,200 mg by mouth daily. Triple   Magnesium 500 MG Tabs Take 500 mg by mouth daily.   memantine 5 MG tablet Commonly known as: NAMENDA Take 5 mg by mouth daily.   multivitamin with minerals Tabs tablet Take 1 tablet by mouth daily.   nitroGLYCERIN 0.4 MG SL tablet Commonly known as: NITROSTAT Place 1 tablet (0.4 mg total) under the tongue every 5 (five) minutes as needed for chest pain.   Omega 3 1200 MG Caps Take 1,200 mg by mouth daily.   pantoprazole 40 MG tablet Commonly known as: PROTONIX Take 1 tablet (40 mg total) by mouth daily. Start taking on: July 11, 2022   pravastatin 40 MG tablet Commonly known as: PRAVACHOL TAKE 1/2 TABLET BY MOUTH EVERY MONDAY, WEDNESDAY AND  FRIDAY. What changed: See the new instructions.   selenium 200 MCG Tabs tablet Take 200 mcg by mouth daily. Plus Vitamin E   sildenafil 20 MG tablet Commonly known as: REVATIO Take 2-3 tablets (40-60 mg total) by mouth as needed (sexual activity). Do not take in combination with nitro SL. What changed:  how much to take how to take this when to take this reasons to take this additional instructions   Vitamin D3 75 MCG (3000 UT) Tabs Take  3,000 Units by mouth daily.   zinc gluconate 50 MG tablet Take 50 mg by mouth daily.           Outstanding Labs/Studies   Lipid clinic referral  Zio patch  Dr. Harl Bowie follow up in [place]  Duration of Discharge Encounter   Greater than 30 minutes including physician time.  Signed, Tami Lin Duke, PA 07/10/2022, 1:24 PM  Agree with note by Fabian Sharp PA-C  Mr. Tukes is a 88 year old Caucasian male who presented with unstable angina.  He has a history of PCI drug-eluting stenting of his mid LAD by Dr. Irish Lack in 2018.  He underwent diagnostic coronary angiography Dr. Dr. Burt Knack yesterday revealing a an 80% distal left main with moderate proximal LAD disease and a patent mid LAD stent.  He also had high-grade calcified ostial dominant RCA stenosis.  He had a FloWire/DFR of the left main which was borderline and therefore not addressed.  The ostial RCA underwent orbital atherectomy, PCI and drug-eluting stenting using IVUS guidance.  He was stable this morning on exam and deemed appropriate for discharge.  He will need DAPT uninterrupted for at least 12 months.  Will have him see a APP back in 7 to 10 days and will make an appointment for him to follow-up with Dr. Carlyle Dolly, his primary cardiologist in 4 to 6 weeks.   Lorretta Harp, M.D., [place], [facility], [place] [address] [place], [place]  [address]  [phone] 07/10/2022 1:44 PM

## 2022-07-11 ENCOUNTER — Telehealth: Payer: Self-pay | Admitting: Cardiology

## 2022-07-11 MED ORDER — NITROGLYCERIN 0.4 MG SL SUBL: 0.4000 mg | SUBLINGUAL_TABLET | SUBLINGUAL | 1 refills | Status: AC | PRN

## 2022-07-11 NOTE — Telephone Encounter (Signed)
*  STAT* If patient is at the pharmacy, call can be transferred to refill team.   1. Which medications need to be refilled? (please list name of each medication and dose if known) nitroGLYCERIN (NITROSTAT) 0.4 MG SL tablet  2. Which pharmacy/location (including street and city if local pharmacy) is medication to be sent to? [place]  3. Do they need a 30 day or 90 day supply? [place]

## 2022-07-11 NOTE — Telephone Encounter (Signed)
Completed.

## 2022-07-15 NOTE — Progress Notes (Signed)
Office Visit    Patient Name: Sean Miranda Date of Encounter: 07/16/2022  PCP:  Asencion Noble, [place]  Cardiologist:  Carlyle Dolly, MD  Advanced Practice Provider:  No care team member to display Electrophysiologist:  None   HPI    Sean Miranda is a 88 y.o. male with past medical history significant for persistent atrial fibrillation (on flecainide and Eliquis), R-CAD, HLD, OA, RBBB and LAFB, nonobstructive coronary disease by remote cardiac cath (DES mid LAD 2018, normal NST 2020) presents today for follow-up visit.  Patient was last seen by Ermalinda Barrios, PA 12/2021.  At that time he was having worsening weakness since COVID-19 a month prior.  Things were going better that particular week.  No issues with chest pain, dyspnea, palpitations, dizziness, lower extremity edema, or bleeding.  He was admitted 07/08/2022 with chest pain.  It started 6 PM the night before when he was outside gathering trash to take to his trash bin.  He had sudden onset of chest pain that was crushing and left-sided in nature without any associated shortness of breath, diaphoresis, nausea, or vomiting.  Pain was persistent and lasted for about 10 to 15 minutes and he presented immediately to the emergency department afterwards.  Initial EKG with only subtle ST and T wave changes and initial troponin was 5.  However, the patient had recurrent chest pain in the emergency department and repeat EKG was very notable for elevation in aVR, and global ST depression.  Repeat troponin was 16.  He underwent cardiac catheterization and ultimately IVUS guided DES to his ostial RCA. On plavix and ASA for at least 6 months.   Today, he feels pretty good.  He has been struggling with low energy for the last several months.  He states that he had a little bit of pain the first night he came home but otherwise has been pain-free.  He has no new shortness of breath.  He tells me that he has not  been taking his pravastatin because it causes his legs to ache.  His last LDL was 150.  We discussed starting on Zetia.  Otherwise, we discussed slowly increasing his exercise level and are going to provide a referral to [facility] cardiac rehab.  His right radial site is healing well without any hematoma.  He will plan to follow-up with Dr. Harl Bowie in a couple of months to check on his progress.  Reports no shortness of breath nor dyspnea on exertion. Reports no chest pain, pressure, or tightness. No edema, orthopnea, PND. Reports no palpitations.   Past Medical History    Past Medical History:  Diagnosis Date   Arthritis    neck    Atrial fibrillation ([place])    a. new-onset in 01/2017. Started on Eliquis   CAD (coronary artery disease), native coronary artery 05/16/2017   LAD 95%>>0 w/ 2.5 x 16 mm Synergy DES, D2 40%, RCA 50%, EF 55-65%, LVEDP mod elevated   Calcification of coronary artery    a. mild, nonobstructive CAD by cath in 2009.   Cancer of skin of chest    Carotid artery occlusion    a. s/p R CEA in 2014   Colon polyps    Hyperlipidemia    takes Pravastatin every other day   Pneumonia ~ 01/2013   Past Surgical History:  Procedure Laterality Date   CARDIAC CATHETERIZATION  06/09/1999   mild CAD (LAD, diagonal, RCA) - Dr. Rockne Menghini  CARDIOVERSION N/A 03/25/2017   Procedure: CARDIOVERSION;  Surgeon: Pixie Casino, MD;  Location: [facility] ENDOSCOPY;  Service: Cardiovascular;  Laterality: N/A;   CARDIOVERSION N/A 09/28/2020   Procedure: CARDIOVERSION;  Surgeon: [name], MD;  Location: [place];  Service: Cardiovascular;  Laterality: N/A;   CAROTID DOPPLER  02/2012   60-79% RICA stenosis, 32-35% LICA stenosis (prior to carotid endarterectomy)   COLONOSCOPY     CORONARY ATHERECTOMY N/A 07/09/2022   Procedure: CORONARY ATHERECTOMY;  Surgeon: Sherren Mocha, MD;  Location: [place] CV LAB;  Service: Cardiovascular;  Laterality: N/A;   CORONARY STENT INTERVENTION N/A  05/16/2017   Procedure: Coronary Stent Intervention;  Surgeon: Jettie Booze, MD;  Location: [place] CV LAB;  Service: Cardiovascular;  Laterality: N/A;   CORONARY STENT INTERVENTION N/A 07/09/2022   Procedure: CORONARY STENT INTERVENTION;  Surgeon: Sherren Mocha, MD;  Location: [place] CV LAB;  Service: Cardiovascular;  Laterality: N/A;   ENDARTERECTOMY Right 11/10/2013   Procedure: ENDARTERECTOMY CAROTID;  Surgeon: Conrad Thorp, MD;  Location: [place];  Service: Vascular;  Laterality: Right;   INGUINAL HERNIA REPAIR Left    INTRAVASCULAR PRESSURE WIRE/FFR STUDY N/A 07/09/2022   Procedure: INTRAVASCULAR PRESSURE WIRE/FFR STUDY;  Surgeon: Sherren Mocha, MD;  Location: [place] CV LAB;  Service: Cardiovascular;  Laterality: N/A;   INTRAVASCULAR ULTRASOUND/IVUS N/A 07/09/2022   Procedure: Intravascular Ultrasound/IVUS;  Surgeon: Sherren Mocha, MD;  Location: [place] CV LAB;  Service: Cardiovascular;  Laterality: N/A;   LEFT HEART CATH AND CORONARY ANGIOGRAPHY N/A 05/16/2017   Procedure: Left Heart Cath and Coronary Angiography;  Surgeon: Jettie Booze, MD;  Location: [place] CV LAB;  Service: Cardiovascular;  Laterality: N/A;   LEFT HEART CATH AND CORONARY ANGIOGRAPHY N/A 07/09/2022   Procedure: LEFT HEART CATH AND CORONARY ANGIOGRAPHY;  Surgeon: Sherren Mocha, MD;  Location: [place] [facility] LAB;  Service: Cardiovascular;  Laterality: N/A;   MOLE REMOVAL  1990s   "chest"   NM MYOCAR PERF WALL MOTION  04/2011   bruce myoview - perfusion defect in inferior myocardium (diaphragmatic attenuation), remaining myocardium with normal perfusion, EF 67%   PATCH ANGIOPLASTY Right 11/10/2013   Procedure: PATCH ANGIOPLASTY;  Surgeon: Conrad Wadsworth, MD;  Location: [place];  Service: Vascular;  Laterality: Right;   PILONIDAL CYST DRAINAGE     SHOULDER OPEN ROTATOR CUFF REPAIR Bilateral [place]  ~ 2015   "chest"   TEMPORARY PACEMAKER N/A 07/09/2022   Procedure:  TEMPORARY PACEMAKER;  Surgeon: Sherren Mocha, MD;  Location: [place] CV LAB;  Service: Cardiovascular;  Laterality: N/A;   TONSILLECTOMY      Allergies  Allergies  Allergen Reactions   Lipitor [Atorvastatin Calcium] Other (See Comments)    Muscle pain   Vantin [Cefpodoxime] Rash    EKGs/Labs/Other Studies Reviewed:   The following studies were reviewed today: Carotid dopplers 01/2021   Summary:  Right Carotid: Velocities in the right ICA are consistent with a 1-39%  stenosis.                Non-hemodynamically significant plaque <50% noted in the  CCA.   Left Carotid: Velocities in the left ICA are consistent with a 1-39%  stenosis.               Non-hemodynamically significant plaque <50% noted in the  CCA.   Vertebrals: Bilateral vertebral arteries demonstrate antegrade flow.  Subclavians: Normal flow hemodynamics were seen in bilateral subclavian  arteries.   *See table(s) above for measurements and observations.      Electronically signed by Harold Barban MD on 01/30/2021 at 11:55:12 AM.      Echo 09/2020 IMPRESSIONS     1. Left ventricular ejection fraction, by estimation, is 65 to 70%. The  left ventricle has normal function. The left ventricle has no regional  wall motion abnormalities. Left ventricular diastolic parameters are  indeterminate.   2. Right ventricular systolic function is normal. The right ventricular  size is normal.   3. Left atrial size was mild to moderately dilated.   4. The mitral valve is normal in structure. No evidence of mitral valve  regurgitation. No evidence of mitral stenosis.   5. The aortic valve was not well visualized. There is mild calcification  of the aortic valve. There is mild thickening of the aortic valve. Aortic  valve regurgitation is not visualized. No aortic stenosis is present.   6. The inferior vena cava is normal in size with greater than 50%  respiratory variability, suggesting right  atrial pressure of 3 mmHg.   FINDINGS   Left Ventricle: Left ventricular ejection fraction, by estimation, is 65  to 70%. The left ventricle has normal function. The left ventricle has no  regional wall motion abnormalities. The left ventricular internal cavity  size was normal in size. There is   no left ventricular hypertrophy. Left ventricular diastolic parameters  are indeterminate.   Right Ventricle: The right ventricular size is normal. Right vetricular  wall thickness was not assessed. Right ventricular systolic function is  normal.   Left Atrium: Left atrial size was mild to moderately dilated.   Right Atrium: Right atrial size was not well visualized.   Pericardium: There is no evidence of pericardial effusion.   Mitral Valve: The mitral valve is normal in structure. No evidence of  mitral valve regurgitation. No evidence of mitral valve stenosis.   Tricuspid Valve: The tricuspid valve is normal in structure. Tricuspid  valve regurgitation is not demonstrated. No evidence of tricuspid  stenosis.   Aortic Valve: The aortic valve was not well visualized. There is mild  calcification of the aortic valve. There is mild thickening of the aortic  valve. There is mild aortic valve annular calcification. Aortic valve  regurgitation is not visualized. No  aortic stenosis is present.   Pulmonic Valve: The pulmonic valve was not well visualized. Pulmonic valve  regurgitation is not visualized. No evidence of pulmonic stenosis.   Aorta: The aortic root is normal in size and structure.   Pulmonary Artery: Indeterminant PASP, inadequate TR jet.   Venous: The inferior vena cava is normal in size with greater than 50%  respiratory variability, suggesting right atrial pressure of 3 mmHg.   IAS/Shunts: No atrial level shunt detected by color flow Doppler.     LEFT VENTRICLE   ECHO: 05/15/2017 - Left ventricle: The cavity size was normal. Wall thickness was   increased in a  pattern of mild LVH. Systolic function was normal.   The estimated ejection fraction was in the range of 60% to 65%.   Wall motion was normal; there were no regional wall motion   abnormalities. The study is not technically sufficient to allow   evaluation of LV diastolic function. - Aortic valve: Mildly calcified annulus. Trileaflet; moderately   thickened leaflets. Valve area (VTI): 2.98 cm^2. Valve area   (Vmax): 2.7 cm^2. Valve area (Vmean): 2.56 cm^2. - Left atrium: The atrium was mildly dilated. -  Technically adequate study.   CATH: 05/16/2017 Mid RCA lesion, 50 %stenosed. Ost RCA lesion, 25 %stenosed. Lesion noted in small RV marginal. LM lesion, 25 %stenosed. 2nd Diag lesion, 40 %stenosed. Mid LAD-1 lesion, 20 %stenosed. Mid LAD-2 lesion, 95 %stenosed. A STENT SYNERGY DES 2.5X16 drug eluting stent was successfully placed, postdilated to 3.0 mm. Post intervention, there is a 0% residual stenosis. The left ventricular systolic function is normal. LV end diastolic pressure is moderately elevated, 25 The left ventricular ejection fraction is 55-65% by visual estimate. There is no aortic valve stenosis.  LAD was the culprit for his unstable angina. He had symptoms during the procedure at rest. These resolved after stent placement. Watch overnight. Continue IV Angiomax for another hour.      Loaded with Plavix today. We'll start 75 mg daily tomorrow. Given aspirin today as well. Would plan for aspirin, Plavix and Eliquis for 30 days. After 30 days, would stop the aspirin and continue Plavix and Eliquis. A synergy drug-eluting stent was used since he is on long-term anticoagulation. If there is a bleeding complication, his antiplatelet therapy could be stopped sooner than usual. Ideally, he would get 6-12 months of Plavix depending on his bleeding risk profile.    Possible discharge tomorrow if he does well tonight and with cardiac rehabilitation tomorrow.  EKG:  EKG is not ordered  today  Recent Labs: 12/11/2021: TSH 1.591 07/09/2022: ALT 15; B Natriuretic Peptide 354.9 07/10/2022: BUN 11; Creatinine, Ser 1.01; Hemoglobin 12.4; Magnesium 2.1; Platelets 176; Potassium 3.9; Sodium 135  Recent Lipid Panel    Component Value Date/Time   CHOL 201 (H) 07/09/2022 0607   CHOL 243 10/07/2019 0000   TRIG 59 07/09/2022 0607   TRIG 86 10/07/2019 0000   HDL 38 (L) 07/09/2022 0607   HDL 44 10/07/2019 0000   CHOLHDL 5.3 07/09/2022 0607   VLDL 12 07/09/2022 0607   LDLCALC 151 (H) 07/09/2022 0607   LDLDIRECT 149.3 01/05/2014 0940   Home Medications   Current Meds  Medication Sig   ALPHA LIPOIC ACID PO Take 250 mg by mouth daily.    amLODipine (NORVASC) 2.5 MG tablet Take 1 tablet (2.5 mg total) by mouth daily.   apixaban (ELIQUIS) 5 MG TABS tablet TAKE (1) TABLET BY MOUTH TWICE DAILY.   aspirin EC 81 MG tablet Take 1 tablet (81 mg total) by mouth daily. Swallow whole.   b complex vitamins capsule Take 1 capsule by mouth every other day.   cetirizine (ZYRTEC) 10 MG tablet Take 10 mg by mouth daily.   Cholecalciferol (VITAMIN D3) 75 MCG (3000 UT) TABS Take 3,000 Units by mouth daily.    clopidogrel (PLAVIX) 75 MG tablet Take 1 tablet (75 mg total) by mouth daily with breakfast.   Coenzyme Q10 (COQ10) 400 MG CAPS Take 800 mg by mouth in the morning and at bedtime.    DHEA 25 MG CAPS Take 25 mg by mouth daily.   dofetilide (TIKOSYN) 250 MCG capsule TAKE (1) CAPSULE BY MOUTH TWICE DAILY.   ezetimibe (ZETIA) 10 MG tablet Take 1 tablet (10 mg total) by mouth daily.   Glucosamine-Chondroitin 500-400 MG CAPS Take 500 mg by mouth daily.    L-Arginine 1000 MG TABS Take 1,000 mg by mouth daily.   Lecithin 1200 MG CAPS Take 1,200 mg by mouth daily. Triple   Magnesium 500 MG TABS Take 500 mg by mouth daily.    memantine (NAMENDA) 5 MG tablet Take 5 mg by mouth daily.   Multiple  Vitamin (MULTIVITAMIN WITH MINERALS) TABS tablet Take 1 tablet by mouth daily.   nitroGLYCERIN (NITROSTAT) 0.4  MG SL tablet Place 1 tablet (0.4 mg total) under the tongue every 5 (five) minutes as needed for chest pain.   Omega 3 1200 MG CAPS Take 1,200 mg by mouth daily.   pantoprazole (PROTONIX) 40 MG tablet Take 1 tablet (40 mg total) by mouth daily.   selenium 200 MCG TABS tablet Take 200 mcg by mouth daily. Plus Vitamin E   sildenafil (REVATIO) 20 MG tablet Take 2-3 tablets (40-60 mg total) by mouth as needed (sexual activity). Do not take in combination with nitro SL.   zinc gluconate 50 MG tablet Take 50 mg by mouth daily.     Review of Systems      All other systems reviewed and are otherwise negative except as noted above.  Physical Exam    VS:  BP 124/60   Pulse (!) 52   Ht 6' (1.829 m)   Wt 184 lb (83.5 kg)   SpO2 98%   BMI 24.95 kg/m  , BMI Body mass index is 24.95 kg/m.  Wt Readings from Last 3 Encounters:  07/16/22 184 lb (83.5 kg)  07/10/22 184 lb 11.2 oz (83.8 kg)  02/13/22 181 lb (82.1 kg)     GEN: Well nourished, well developed, in no acute distress. HEENT: normal. Neck: Supple, no JVD, carotid bruits, or masses. Cardiac: RRR, no murmurs, rubs, or gallops. No clubbing, cyanosis, edema.  Radials/PT 2+ and equal bilaterally.  Respiratory:  Respirations regular and unlabored, clear to auscultation bilaterally. GI: Soft, nontender, nondistended. MS: No deformity or atrophy. Skin: Warm and dry, no rash. Neuro:  Strength and sensation are intact. Psych: Normal affect.  Assessment & Plan    Unstable angina/NSTEMI/CAD -DES to LAD back in 2018 -DES to proximal RCA 07/09/22 -triple therapy with Eliquis/ASA/Plavix -ASA X 30 days, Plavix x at least 6 months -right radial site without hematoma   Persistent Afib -continue Eliquis  -no bleeding issues with triple therapy -He did have a recent fall and hit his left knee and there is some residual edema in the right ankle, he does not have frequent falls (xray unremarkable, may require MRI) -Zio monitor in place today. He  will wear for another week and then send in  Bilateral carotid artery stenosis -march 2024, [facility]  Mixed Hyperlipidemia -intolerant to statins and he is not taking his pravastatin  -wants to try zetia, follow-up lipid panel -no injectables   RBBB (chronic) -stable         Cardiac Rehabilitation Eligibility Assessment  The patient is ready to start cardiac rehabilitation from a cardiac standpoint.   Disposition: Follow up 3 months with Carlyle Dolly, MD or APP.  Signed, Elgie Collard, PA-C 07/16/2022, 3:12 PM [facility]

## 2022-07-16 ENCOUNTER — Encounter: Payer: Self-pay | Admitting: Physician Assistant

## 2022-07-16 ENCOUNTER — Ambulatory Visit: Payer: Medicare HMO | Admitting: Physician Assistant

## 2022-07-16 VITALS — BP 124/60 | HR 52 | Ht 72.0 in | Wt 184.0 lb

## 2022-07-16 DIAGNOSIS — I6523 Occlusion and stenosis of bilateral carotid arteries: Secondary | ICD-10-CM | POA: Diagnosis not present

## 2022-07-16 DIAGNOSIS — I2510 Atherosclerotic heart disease of native coronary artery without angina pectoris: Secondary | ICD-10-CM | POA: Diagnosis not present

## 2022-07-16 DIAGNOSIS — E782 Mixed hyperlipidemia: Secondary | ICD-10-CM

## 2022-07-16 DIAGNOSIS — I4811 Longstanding persistent atrial fibrillation: Secondary | ICD-10-CM

## 2022-07-16 DIAGNOSIS — I4510 Unspecified right bundle-branch block: Secondary | ICD-10-CM | POA: Diagnosis not present

## 2022-07-16 MED ORDER — EZETIMIBE 10 MG PO TABS: 10.0000 mg | ORAL_TABLET | Freq: Every day | ORAL | 3 refills | Status: AC

## 2022-07-16 NOTE — Patient Instructions (Signed)
Medication Instructions:  1.Start zetia 10 mg daily 2.Stop aspirin 81 mg in 3 weeks *If you need a refill on your cardiac medications before your next appointment, please call your pharmacy*   Lab Work: Fasting lipid panel in 8 weeks If you have labs (blood work) drawn today and your tests are completely normal, you will receive your results only by: [place] (if you have MyChart) OR A paper copy in the mail If you have any lab test that is abnormal or we need to change your treatment, we will call you to review the results.  Follow-Up: At [facility], you and your health needs are our priority.  As part of our continuing mission to provide you with exceptional heart care, we have created designated Provider Care Teams.  These Care Teams include your primary Cardiologist (physician) and Advanced Practice Providers (APPs -  Physician Assistants and Nurse Practitioners) who all work together to provide you with the care you need, when you need it.  Your next appointment:   3 month(s)  The format for your next appointment:   In Person  Provider:   Carlyle Dolly, MD{  Important Information About Sugar

## 2022-07-17 ENCOUNTER — Other Ambulatory Visit: Payer: Self-pay

## 2022-07-17 DIAGNOSIS — E782 Mixed hyperlipidemia: Secondary | ICD-10-CM

## 2022-07-18 DIAGNOSIS — I4811 Longstanding persistent atrial fibrillation: Secondary | ICD-10-CM | POA: Diagnosis not present

## 2022-07-20 DIAGNOSIS — M25572 Pain in left ankle and joints of left foot: Secondary | ICD-10-CM | POA: Diagnosis not present

## 2022-07-20 DIAGNOSIS — M25512 Pain in left shoulder: Secondary | ICD-10-CM | POA: Diagnosis not present

## 2022-07-24 ENCOUNTER — Other Ambulatory Visit: Payer: Self-pay | Admitting: Internal Medicine

## 2022-07-27 ENCOUNTER — Encounter (HOSPITAL_COMMUNITY): Payer: Self-pay

## 2022-07-27 ENCOUNTER — Encounter (HOSPITAL_COMMUNITY)
Admission: RE | Admit: 2022-07-27 | Discharge: 2022-07-27 | Disposition: A | Discharge status: Home / Self Care | Payer: Medicare HMO | Source: Ambulatory Visit | Attending: Cardiology | Admitting: Cardiology

## 2022-07-27 VITALS — BP 114/78 | HR 55 | Ht 72.0 in | Wt 189.6 lb

## 2022-07-27 DIAGNOSIS — Z955 Presence of coronary angioplasty implant and graft: Secondary | ICD-10-CM | POA: Insufficient documentation

## 2022-07-27 DIAGNOSIS — I214 Non-ST elevation (NSTEMI) myocardial infarction: Secondary | ICD-10-CM | POA: Insufficient documentation

## 2022-07-27 NOTE — Progress Notes (Signed)
Cardiac Individual Treatment Plan  Patient Details  Name: Sean Miranda MRN: [id number] Date of Birth: 1930/05/11 Referring Provider:   Flowsheet Row CARDIAC REHAB PHASE II ORIENTATION from 07/27/2022 in [facility]  Referring Provider Dr. Alvester Chou       Initial Encounter Date:  Flowsheet Row CARDIAC REHAB PHASE II ORIENTATION from 07/27/2022 in [facility]  Date 07/27/22       Visit Diagnosis: S/P primary angioplasty with coronary stent  NSTEMI (non-ST elevated myocardial infarction) ([place])  Patient's Home Medications on Admission:  Current Outpatient Medications:    apixaban (ELIQUIS) 5 MG TABS tablet, TAKE (1) TABLET BY MOUTH TWICE DAILY., Disp: 60 tablet, Rfl: 6   aspirin EC 81 MG tablet, Take 1 tablet (81 mg total) by mouth daily. Swallow whole., Disp: 30 tablet, Rfl: 0   clopidogrel (PLAVIX) 75 MG tablet, Take 1 tablet (75 mg total) by mouth daily with breakfast., Disp: 30 tablet, Rfl: 11   Coenzyme Q10 (COQ10) 400 MG CAPS, Take 800 mg by mouth in the morning and at bedtime. , Disp: , Rfl:    Magnesium 500 MG TABS, Take 500 mg by mouth daily. , Disp: , Rfl:    Multiple Vitamin (MULTIVITAMIN WITH MINERALS) TABS tablet, Take 1 tablet by mouth daily., Disp: , Rfl:    nitroGLYCERIN (NITROSTAT) 0.4 MG SL tablet, Place 1 tablet (0.4 mg total) under the tongue every 5 (five) minutes as needed for chest pain., Disp: 25 tablet, Rfl: 1   zinc gluconate 50 MG tablet, Take 50 mg by mouth daily., Disp: , Rfl:    ALPHA LIPOIC ACID PO, Take 250 mg by mouth daily.  (Patient not taking: Reported on 07/27/2022), Disp: , Rfl:    amLODipine (NORVASC) 2.5 MG tablet, Take 1 tablet (2.5 mg total) by mouth daily. (Patient not taking: Reported on 07/27/2022), Disp: 30 tablet, Rfl: 11   dofetilide (TIKOSYN) 250 MCG capsule, TAKE (1) CAPSULE BY MOUTH TWICE DAILY. (Patient not taking: Reported on 07/27/2022), Disp: 30 capsule, Rfl: 0   ezetimibe (ZETIA) 10 MG  tablet, Take 1 tablet (10 mg total) by mouth daily. (Patient not taking: Reported on 07/27/2022), Disp: 90 tablet, Rfl: 3   pantoprazole (PROTONIX) 40 MG tablet, Take 1 tablet (40 mg total) by mouth daily. (Patient not taking: Reported on 07/27/2022), Disp: 30 tablet, Rfl: 11   sildenafil (REVATIO) 20 MG tablet, Take 2-3 tablets (40-60 mg total) by mouth as needed (sexual activity). Do not take in combination with nitro SL. (Patient not taking: Reported on 07/27/2022), Disp: , Rfl:   Past Medical History: Past Medical History:  Diagnosis Date   Arthritis    neck    Atrial fibrillation ([place])    a. new-onset in 01/2017. Started on Eliquis   CAD (coronary artery disease), native coronary artery 05/16/2017   LAD 95%>>0 w/ 2.5 x 16 mm Synergy DES, D2 40%, RCA 50%, EF 55-65%, LVEDP mod elevated   Calcification of coronary artery    a. mild, nonobstructive CAD by cath in 2009.   Cancer of skin of chest    Carotid artery occlusion    a. s/p R CEA in 2014   Colon polyps    Hyperlipidemia    takes Pravastatin every other day   Pneumonia ~ 01/2013    Tobacco Use: Social History   Tobacco Use  Smoking Status Former   Packs/day: 1.00   Years: 18.00   Total pack years: 18.00   Types: Cigarettes   Quit  date: 12/03/1972   Years since quitting: 49.6  Smokeless Tobacco Never    Labs: Review Flowsheet  More data exists      Latest Ref Rng & Units 05/12/2018 05/06/2019 10/07/2019 01/28/2020 07/09/2022  Labs for ITP Cardiac and Pulmonary Rehab  Cholestrol 0 - 200 mg/dL 195  203  243     197  201   LDL (calc) 0 - 99 mg/dL 141  141  179     138  151   HDL-C >40 mg/dL 39.60  48.40  44     45  38   Trlycerides <150 mg/dL 73.0  67.0  86     71  59   Hemoglobin A1c 4.8 - 5.6 % 5.5  - - 5.2  5.1     Details       This result is from an external source.         Capillary Blood Glucose: Lab Results  Component Value Date   GLUCAP 75 02/05/2019   GLUCAP 136 (H) 06/29/2017     Exercise Target  Goals: Exercise Program Goal: Individual exercise prescription set using results from initial 6 min walk test and THRR while considering  patient's activity barriers and safety.   Exercise Prescription Goal: Starting with aerobic activity 30 plus minutes a day, 3 days per week for initial exercise prescription. Provide home exercise prescription and guidelines that participant acknowledges understanding prior to discharge.  Activity Barriers & Risk Stratification:  Activity Barriers & Cardiac Risk Stratification - 07/27/22 1311       Activity Barriers & Cardiac Risk Stratification   Activity Barriers Shortness of Breath    Cardiac Risk Stratification High             6 Minute Walk:  6 Minute Walk     Row Name 07/27/22 1349         6 Minute Walk   Phase Initial     Distance 1150 feet     Walk Time 6 minutes     # of Rest Breaks 0     MPH 2.17     METS 1.47     RPE 12     VO2 Peak 5.17     Symptoms No     Resting HR 55 bpm     Resting BP 114/78     Resting Oxygen Saturation  97 %     Exercise Oxygen Saturation  during 6 min walk 98 %     Max Ex. HR 73 bpm     Max Ex. BP 130/70     2 Minute Post BP 110/72              Oxygen Initial Assessment:   Oxygen Re-Evaluation:   Oxygen Discharge (Final Oxygen Re-Evaluation):   Initial Exercise Prescription:  Initial Exercise Prescription - 07/27/22 1300       Date of Initial Exercise RX and Referring Provider   Date 07/27/22    Referring Provider Dr. Alvester Chou    Expected Discharge Date 10/17/22      Treadmill   MPH 2.1    Grade 0    Minutes 17      Recumbant Elliptical   Level 1    RPM 45    Minutes 22      Prescription Details   Frequency (times per week) 3    Duration Progress to 30 minutes of continuous aerobic without signs/symptoms of physical distress      Intensity  THRR 40-80% of Max Heartrate 52-104    Ratings of Perceived Exertion 11-13      Resistance Training   Training  Prescription Yes    Weight 3    Reps 10-15             Perform Capillary Blood Glucose checks as needed.  Exercise Prescription Changes:   Exercise Comments:   Exercise Goals and Review:   Exercise Goals     Row Name 07/27/22 1353             Exercise Goals   Increase Physical Activity Yes       Intervention Provide advice, education, support and counseling about physical activity/exercise needs.;Develop an individualized exercise prescription for aerobic and resistive training based on initial evaluation findings, risk stratification, comorbidities and participant's personal goals.       Expected Outcomes Short Term: Attend rehab on a regular basis to increase amount of physical activity.;Long Term: Add in home exercise to make exercise part of routine and to increase amount of physical activity.;Long Term: Exercising regularly at least 3-5 days a week.       Increase Strength and Stamina Yes       Intervention Provide advice, education, support and counseling about physical activity/exercise needs.;Develop an individualized exercise prescription for aerobic and resistive training based on initial evaluation findings, risk stratification, comorbidities and participant's personal goals.       Expected Outcomes Short Term: Increase workloads from initial exercise prescription for resistance, speed, and METs.;Short Term: Perform resistance training exercises routinely during rehab and add in resistance training at home;Long Term: Improve cardiorespiratory fitness, muscular endurance and strength as measured by increased METs and functional capacity (6MWT)       Able to understand and use rate of perceived exertion (RPE) scale Yes       Intervention Provide education and explanation on how to use RPE scale       Expected Outcomes Short Term: Able to use RPE daily in rehab to express subjective intensity level;Long Term:  Able to use RPE to guide intensity level when exercising  independently       Knowledge and understanding of Target Heart Rate Range (THRR) Yes       Intervention Provide education and explanation of THRR including how the numbers were predicted and where they are located for reference       Expected Outcomes Short Term: Able to state/look up THRR;Long Term: Able to use THRR to govern intensity when exercising independently;Short Term: Able to use daily as guideline for intensity in rehab       Able to check pulse independently Yes       Intervention Provide education and demonstration on how to check pulse in carotid and radial arteries.;Review the importance of being able to check your own pulse for safety during independent exercise       Expected Outcomes Short Term: Able to explain why pulse checking is important during independent exercise;Long Term: Able to check pulse independently and accurately       Understanding of Exercise Prescription Yes       Intervention Provide education, explanation, and written materials on patient's individual exercise prescription       Expected Outcomes Short Term: Able to explain program exercise prescription;Long Term: Able to explain home exercise prescription to exercise independently                Exercise Goals Re-Evaluation :    Discharge Exercise Prescription (Final Exercise  Prescription Changes):   Nutrition:  Target Goals: Understanding of nutrition guidelines, daily intake of sodium '1500mg'$ , cholesterol '200mg'$ , calories 30% from fat and 7% or less from saturated fats, daily to have 5 or more servings of fruits and vegetables.  Biometrics:  Pre Biometrics - 07/27/22 1355       Pre Biometrics   Height 6' (1.829 m)    Weight 86 kg    Waist Circumference 41 inches    Hip Circumference 42 inches    Waist to Hip Ratio 0.98 %    BMI (Calculated) 25.71    Triceps Skinfold 12 mm    % Body Fat 26.8 %    Grip Strength 31.1 kg    Flexibility 0 in    Single Leg Stand 11 seconds               Nutrition Therapy Plan and Nutrition Goals:  Nutrition Therapy & Goals - 07/27/22 1325       Personal Nutrition Goals   Comments Patient scored 18 on his diet assessment. Handout provided and explained regarding healthier choices. He says he is trying to eat heart healthy. He and his wife cook and they eat out some. We offer 2 educational sessions on heart healthy nutrition with handouts.      Intervention Plan   Intervention Nutrition handout(s) given to patient.    Expected Outcomes Short Term Goal: Understand basic principles of dietary content, such as calories, fat, sodium, cholesterol and nutrients.             Nutrition Assessments:  Nutrition Assessments - 07/27/22 1326       MEDFICTS Scores   Pre Score 18            MEDIFICTS Score Key: ?70 Need to make dietary changes  40-70 Heart Healthy Diet ? 40 Therapeutic Level Cholesterol Diet   Picture Your Plate Scores: <09 Unhealthy dietary pattern with much room for improvement. 41-50 Dietary pattern unlikely to meet recommendations for good health and room for improvement. 51-60 More healthful dietary pattern, with some room for improvement.  >60 Healthy dietary pattern, although there may be some specific behaviors that could be improved.    Nutrition Goals Re-Evaluation:   Nutrition Goals Discharge (Final Nutrition Goals Re-Evaluation):   Psychosocial: Target Goals: Acknowledge presence or absence of significant depression and/or stress, maximize coping skills, provide positive support system. Participant is able to verbalize types and ability to use techniques and skills needed for reducing stress and depression.  Initial Review & Psychosocial Screening:  Initial Psych Review & Screening - 07/27/22 1331       Initial Review   Current issues with None Identified      Family Dynamics   Good Support System? Yes      Barriers   Psychosocial barriers to participate in program There are no  identifiable barriers or psychosocial needs.      Screening Interventions   Interventions Encouraged to exercise;Provide feedback about the scores to participant    Expected Outcomes Short Term goal: Identification and review with participant of any Quality of Life or Depression concerns found by scoring the questionnaire.             Quality of Life Scores:  Quality of Life - 07/27/22 1358       Quality of Life   Select Quality of Life      Quality of Life Scores   Health/Function Pre 26.21 %    Socioeconomic Pre 29.64 %  Psych/Spiritual Pre 28.79 %    Family Pre 27 %    GLOBAL Pre 27.61 %            Scores of 19 and below usually indicate a poorer quality of life in these areas.  A difference of  2-3 points is a clinically meaningful difference.  A difference of 2-3 points in the total score of the Quality of Life Index has been associated with significant improvement in overall quality of life, self-image, physical symptoms, and general health in studies assessing change in quality of life.  PHQ-9: Review Flowsheet  More data exists      07/27/2022 05/06/2019 04/30/2018 12/21/2016 10/31/2015  Depression screen PHQ 2/9  Decreased Interest 0 0 0 0 0 0  Down, Depressed, Hopeless 0 0 0 0 0 0  PHQ - 2 Score 0 0 0 0 0 0  Altered sleeping 0 - - - -  Tired, decreased energy 1 - - - -  Change in appetite 0 - - - -  Feeling bad or failure about yourself  0 - - - -  Trouble concentrating 0 - - - -  Moving slowly or fidgety/restless 0 - - - -  Suicidal thoughts 0 - - - -  PHQ-9 Score 1 - - - -  Difficult doing work/chores Not difficult at all - - - -   Interpretation of Total Score  Total Score Depression Severity:  1-4 = Minimal depression, 5-9 = Mild depression, 10-14 = Moderate depression, 15-19 = Moderately severe depression, 20-27 = Severe depression   Psychosocial Evaluation and Intervention:  Psychosocial Evaluation - 07/27/22 1342       Psychosocial Evaluation  & Interventions   Interventions Stress management education;Relaxation education;Encouraged to exercise with the program and follow exercise prescription    Comments Patient has no psychosocial barriers or issues identified at his orientation visit. His PHQ-9 score was 1 due to lack of energy. He denies any depression or anxiety. He says he is a very happy man. He lives with his wife of 65 years. His first wife passed away with cancer. They have 3 children together. 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 He says he goes in to work everyday but his sons are the main overseers of his business. He names his wife and children as his emotional support. He is not sure how much of the program he wants to do. He does have a $40 copayment but says this is not an issue. He has a treadmill at home and says once he learns what to do, he can do it on his own at home. I ask him to commit to at leaast half of the program, 18 sessions. He said he would "see how it goes". He says he has 90% of his energy and stamina back and wants to get back to 100%. He is ready to get started.    Expected Outcomes Patient will continue to have no psychosocial barriers or issues identified.    Continue Psychosocial Services  No Follow up required             Psychosocial Re-Evaluation:   Psychosocial Discharge (Final Psychosocial Re-Evaluation):   Vocational Rehabilitation: Provide vocational rehab assistance to qualifying candidates.   Vocational Rehab Evaluation & Intervention:  Vocational Rehab - 07/27/22 1328       Initial Vocational Rehab Evaluation & Intervention   Assessment shows need for Vocational  Rehabilitation No      Vocational Rehab Re-Evaulation   Comments Patient continues to work and does not need vocational rehab.             Education: Education Goals: Education classes will be provided on a weekly basis, covering required topics.  Participant will state understanding/return demonstration of topics presented.  Learning Barriers/Preferences:  Learning Barriers/Preferences - 07/27/22 1327       Learning Barriers/Preferences   Learning Barriers None    Learning Preferences Written Material             Education Topics: Hypertension, Hypertension Reduction -Define heart disease and high blood pressure. Discus how high blood pressure affects the body and ways to reduce high blood pressure.   Exercise and Your Heart -Discuss why it is important to exercise, the FITT principles of exercise, normal and abnormal responses to exercise, and how to exercise safely.   Angina -Discuss definition of angina, causes of angina, treatment of angina, and how to decrease risk of having angina.   Cardiac Medications -Review what the following cardiac medications are used for, how they affect the body, and side effects that may occur when taking the medications.  Medications include Aspirin, Beta blockers, calcium channel blockers, ACE Inhibitors, angiotensin receptor blockers, diuretics, digoxin, and antihyperlipidemics.   Congestive Heart Failure -Discuss the definition of CHF, how to live with CHF, the signs and symptoms of CHF, and how keep track of weight and sodium intake.   Heart Disease and Intimacy -Discus the effect sexual activity has on the heart, how changes occur during intimacy as we age, and safety during sexual activity.   Smoking Cessation / COPD -Discuss different methods to quit smoking, the health benefits of quitting smoking, and the definition of COPD.   Nutrition I: Fats -Discuss the types of cholesterol, what cholesterol does to the heart, and how cholesterol levels can be controlled.   Nutrition II: Labels -Discuss the different components of food labels and how to read food label   Heart Parts/Heart Disease and PAD -Discuss the anatomy of the heart, the pathway of blood circulation  through the heart, and these are affected by heart disease.   Stress I: Signs and Symptoms -Discuss the causes of stress, how stress may lead to anxiety and depression, and ways to limit stress.   Stress II: Relaxation -Discuss different types of relaxation techniques to limit stress.   Warning Signs of Stroke / TIA -Discuss definition of a stroke, what the signs and symptoms are of a stroke, and how to identify when someone is having stroke.   Knowledge Questionnaire Score:  Knowledge Questionnaire Score - 07/27/22 1328       Knowledge Questionnaire Score   Pre Score 20/24             Core Components/Risk Factors/Patient Goals at Admission:  Personal Goals and Risk Factors at Admission - 07/27/22 1328       Core Components/Risk Factors/Patient Goals on Admission    Weight Management Weight Maintenance    Hypertension Yes    Intervention Provide education on lifestyle modifcations including regular physical activity/exercise, weight management, moderate sodium restriction and increased consumption of fresh fruit, vegetables, and low fat dairy, alcohol moderation, and smoking cessation.    Expected Outcomes Short Term: Continued assessment and intervention until BP is < 140/22m HG in hypertensive participants. < 130/8102mHG in hypertensive participants with diabetes, heart failure or chronic kidney disease.;Long Term: Maintenance of blood pressure at goal levels.  Lipids Yes    Intervention Provide education and support for participant on nutrition & aerobic/resistive exercise along with prescribed medications to achieve LDL '70mg'$ , HDL >'40mg'$ .    Expected Outcomes Short Term: Participant states understanding of desired cholesterol values and is compliant with medications prescribed. Participant is following exercise prescription and nutrition guidelines.;Long Term: Cholesterol controlled with medications as prescribed, with individualized exercise RX and with personalized  nutrition plan. Value goals: LDL < '70mg'$ , HDL > 40 mg.    Personal Goal Other Yes    Personal Goal Patient wants to improve his strength and stamina and get back to his activities.    Intervention Patient will attend CR 3 days/week with exercise and education and supplement with exercise at home.    Expected Outcomes Patient will complete the program meeting both personal and program goals.             Core Components/Risk Factors/Patient Goals Review:    Core Components/Risk Factors/Patient Goals at Discharge (Final Review):    ITP Comments:   Comments: Patient arrived for 1st visit/orientation/education at 1230. Patient was referred to CR by Carlyle Dolly, MD due to Status Post Primary Angioplasty with Coronary Stent (Z95.5) and NSTEMI (. During orientation advised patient on arrival and 21.4). appointment times what to wear, what to do before, during and after exercise. Reviewed attendance and class policy.  Pt is scheduled to return Cardiac Rehab on 07/30/22 at 1430. Pt was advised to come to class 15 minutes before class starts.  Discussed RPE/Dpysnea scales. Patient participated in warm up stretches. Patient was able to complete 6 minute walk test.  Telemetry:NSR with RBBB. Patient was measured for the equipment. Discussed equipment safety with patient. Took patient pre-anthropometric measurements. Patient finished visit at 1400.

## 2022-07-30 ENCOUNTER — Encounter (HOSPITAL_COMMUNITY): Payer: Medicare HMO

## 2022-07-30 ENCOUNTER — Encounter (HOSPITAL_COMMUNITY)
Admission: RE | Admit: 2022-07-30 | Discharge: 2022-07-30 | Disposition: A | Discharge status: Home / Self Care | Payer: Medicare HMO | Source: Ambulatory Visit | Attending: Cardiology | Admitting: Cardiology

## 2022-07-30 VITALS — Wt 190.5 lb

## 2022-07-30 DIAGNOSIS — I214 Non-ST elevation (NSTEMI) myocardial infarction: Secondary | ICD-10-CM

## 2022-07-30 DIAGNOSIS — Z955 Presence of coronary angioplasty implant and graft: Secondary | ICD-10-CM | POA: Diagnosis not present

## 2022-07-30 NOTE — Progress Notes (Signed)
Daily Session Note  Patient Details  Name: Sean Miranda MRN: [id number] Date of Birth: 1930-09-07 Referring Provider:   Flowsheet Row CARDIAC REHAB PHASE II ORIENTATION from 07/27/2022 in [facility]  Referring Provider Dr. Alvester Chou       Encounter Date: 07/30/2022  Check In:  Session Check In - 07/30/22 1100       Check-In   Supervising physician immediately available to respond to emergencies [facility] MD immediately available    Physician(s) Dr. Marlou Porch    Location [facility]    Staff Present Redge Gainer, BS, Exercise Physiologist;Aneira Cavitt Wynetta Emery, RN, Joanette Gula, RN, BSN    Virtual Visit No    Medication changes reported     No    Fall or balance concerns reported    Yes    Comments Patient has history of a fall a few weeks ago falling out of bed.    Tobacco Cessation No Change    Warm-up and Cool-down Performed as group-led instruction    Resistance Training Performed Yes    VAD Patient? No    PAD/SET Patient? No      Pain Assessment   Currently in Pain? No/denies    Pain Score 0-No pain    Multiple Pain Sites No             Capillary Blood Glucose: No results found for this or any previous visit (from the past 24 hour(s)).    Social History   Tobacco Use  Smoking Status Former   Packs/day: 1.00   Years: 18.00   Total pack years: 18.00   Types: Cigarettes   Quit date: 12/03/1972   Years since quitting: 49.6  Smokeless Tobacco Never    Goals Met:  Independence with exercise equipment Exercise tolerated well No report of concerns or symptoms today Strength training completed today  Goals Unmet:  Not Applicable  Comments: Check out 1200.   Dr. Carlyle Dolly is Medical Director for [facility]

## 2022-08-01 ENCOUNTER — Ambulatory Visit (HOSPITAL_COMMUNITY): Payer: Medicare HMO

## 2022-08-01 ENCOUNTER — Encounter (HOSPITAL_COMMUNITY)
Admission: RE | Admit: 2022-08-01 | Discharge: 2022-08-01 | Disposition: A | Discharge status: Home / Self Care | Payer: Medicare HMO | Source: Ambulatory Visit | Attending: Cardiology | Admitting: Cardiology

## 2022-08-01 DIAGNOSIS — I214 Non-ST elevation (NSTEMI) myocardial infarction: Secondary | ICD-10-CM | POA: Diagnosis not present

## 2022-08-01 DIAGNOSIS — Z955 Presence of coronary angioplasty implant and graft: Secondary | ICD-10-CM

## 2022-08-01 NOTE — Progress Notes (Signed)
Daily Session Note  Patient Details  Name: Sean Miranda MRN: [id number] Date of Birth: 12-Dec-1929 Referring Provider:   Flowsheet Row CARDIAC REHAB PHASE II ORIENTATION from 07/27/2022 in [facility]  Referring Provider Dr. Alvester Chou       Encounter Date: 08/01/2022  Check In:  Session Check In - 08/01/22 1056       Check-In   Supervising physician immediately available to respond to emergencies [facility] MD immediately available    Physician(s) Dr. Phineas Inches    Location [facility]    Staff Present Hoy Register, MS, ACSM-CEP, Exercise Physiologist;Heather Zigmund Daniel, Exercise Physiologist;Daphyne Hassell Done, RN, BSN    Virtual Visit No    Medication changes reported     No    Fall or balance concerns reported    Yes    Comments Patient has history of a fall a few weeks ago falling out of bed.    Tobacco Cessation No Change    Warm-up and Cool-down Performed as group-led instruction    Resistance Training Performed Yes    VAD Patient? No    PAD/SET Patient? No      Pain Assessment   Currently in Pain? No/denies    Pain Score 0-No pain    Multiple Pain Sites No             Capillary Blood Glucose: No results found for this or any previous visit (from the past 24 hour(s)).    Social History   Tobacco Use  Smoking Status Former   Packs/day: 1.00   Years: 18.00   Total pack years: 18.00   Types: Cigarettes   Quit date: 12/03/1972   Years since quitting: 49.6  Smokeless Tobacco Never    Goals Met:  Independence with exercise equipment Exercise tolerated well No report of concerns or symptoms today Strength training completed today  Goals Unmet:  Not Applicable  Comments: checkout time is 1200   Dr. Carlyle Dolly is Medical Director for [facility]

## 2022-08-02 ENCOUNTER — Telehealth: Payer: Self-pay | Admitting: Cardiology

## 2022-08-02 NOTE — Telephone Encounter (Signed)
Pt's daughter would like a callback regarding his Cardiac Rehab. She would like to know when pt will be able to return to his normal duties such as driving and lifting. Please advise

## 2022-08-02 NOTE — Telephone Encounter (Signed)
I will defer to Dr.Branch °

## 2022-08-03 ENCOUNTER — Ambulatory Visit (HOSPITAL_COMMUNITY): Payer: Medicare HMO

## 2022-08-03 ENCOUNTER — Encounter (HOSPITAL_COMMUNITY)
Admission: RE | Admit: 2022-08-03 | Discharge: 2022-08-03 | Disposition: A | Discharge status: Home / Self Care | Payer: Medicare HMO | Source: Ambulatory Visit | Attending: Cardiology | Admitting: Cardiology

## 2022-08-03 DIAGNOSIS — Z955 Presence of coronary angioplasty implant and graft: Secondary | ICD-10-CM | POA: Diagnosis not present

## 2022-08-03 DIAGNOSIS — I214 Non-ST elevation (NSTEMI) myocardial infarction: Secondary | ICD-10-CM | POA: Insufficient documentation

## 2022-08-03 NOTE — Progress Notes (Signed)
Daily Session Note  Patient Details  Name: Sean Miranda MRN: [id number] Date of Birth: 05/10/1930 Referring Provider:   Flowsheet Row CARDIAC REHAB PHASE II ORIENTATION from 07/27/2022 in [facility]  Referring Provider Dr. Barry       Encounter Date: 08/03/2022  Check In:  Session Check In - 08/03/22 1055       Check-In   Supervising physician immediately available to respond to emergencies [facility] MD immediately available    Physician(s) Dr. Hilty    Location [facility]    Staff Present Dalton Fletcher, MS, ACSM-CEP, Exercise Physiologist;Heather Jachimiak, BS, Exercise Physiologist;Daphyne Martin, RN, BSN; , RN;Debra Johnson, RN, BSN    Virtual Visit No    Medication changes reported     No    Fall or balance concerns reported    Yes    Comments Patient has history of a fall a few weeks ago falling out of bed.    Tobacco Cessation No Change    Warm-up and Cool-down Performed as group-led instruction    Resistance Training Performed Yes    VAD Patient? No    PAD/SET Patient? No      Pain Assessment   Currently in Pain? No/denies    Pain Score 0-No pain    Multiple Pain Sites No             Capillary Blood Glucose: No results found for this or any previous visit (from the past 24 hour(s)).    Social History   Tobacco Use  Smoking Status Former   Packs/day: 1.00   Years: 18.00   Total pack years: 18.00   Types: Cigarettes   Quit date: 12/03/1972   Years since quitting: 49.6  Smokeless Tobacco Never    Goals Met:  Independence with exercise equipment Exercise tolerated well No report of concerns or symptoms today Strength training completed today  Goals Unmet:  Not Applicable  Comments: checkout @ 12:00pm   Dr. Jonathan Branch is Medical Director for [facility] 

## 2022-08-06 ENCOUNTER — Encounter (HOSPITAL_COMMUNITY): Payer: Medicare HMO

## 2022-08-06 ENCOUNTER — Ambulatory Visit (HOSPITAL_COMMUNITY): Payer: Medicare HMO

## 2022-08-07 ENCOUNTER — Other Ambulatory Visit: Payer: Self-pay | Admitting: Internal Medicine

## 2022-08-07 NOTE — Telephone Encounter (Signed)
Ok for lifting. He had some low heart rates during his admission and on heart monitor, is he having any issues with severe lightheadedness or dizziness? This would help decide on driving for him  Zandra Abts MD

## 2022-08-07 NOTE — Telephone Encounter (Signed)
Daughter states he has no c/o dizziness or lightheadedness. He is doing well in cardiac rehab. They made November f/u with Dr.Branch-per August apt in [place]

## 2022-08-08 ENCOUNTER — Ambulatory Visit (HOSPITAL_COMMUNITY): Payer: Medicare HMO

## 2022-08-08 ENCOUNTER — Encounter (HOSPITAL_COMMUNITY)
Admission: RE | Admit: 2022-08-08 | Discharge: 2022-08-08 | Disposition: A | Discharge status: Home / Self Care | Payer: Medicare HMO | Source: Ambulatory Visit | Attending: Cardiology | Admitting: Cardiology

## 2022-08-08 DIAGNOSIS — I214 Non-ST elevation (NSTEMI) myocardial infarction: Secondary | ICD-10-CM

## 2022-08-08 DIAGNOSIS — Z955 Presence of coronary angioplasty implant and graft: Secondary | ICD-10-CM | POA: Diagnosis not present

## 2022-08-08 NOTE — Progress Notes (Signed)
Daily Session Note  Patient Details  Name: Sean Miranda MRN: [id number] Date of Birth: [birth year] Referring Provider:   Flowsheet Row CARDIAC REHAB PHASE II ORIENTATION from 07/27/2022 in [place]  Referring Provider Dr. Alvester Chou       Encounter Date: 08/08/2022  Check In:  Session Check In - 08/08/22 1109       Check-In   Supervising physician immediately available to respond to emergencies [facility] MD immediately available    Physician(s) Dr Harl Bowie    Location [facility]    Staff Present Akiko Schexnider Hassell Done, RN, BSN;Heather Otho Ket, BS, Exercise Physiologist    Virtual Visit No    Fall or balance concerns reported    Yes    Comments Patient has history of a fall a few weeks ago falling out of bed.    Tobacco Cessation No Change    Warm-up and Cool-down Performed as group-led instruction    Resistance Training Performed Yes    VAD Patient? No    PAD/SET Patient? No      Pain Assessment   Currently in Pain? No/denies    Pain Score 0-No pain    Multiple Pain Sites No             Capillary Blood Glucose: No results found for this or any previous visit (from the past 24 hour(s)).    Social History   Tobacco Use  Smoking Status Former   Packs/day: 1.00   Years: 18.00   Total pack years: 18.00   Types: Cigarettes   Quit date: 12/03/1972   Years since quitting: 49.7  Smokeless Tobacco Never    Goals Met:  Independence with exercise equipment Exercise tolerated well No report of concerns or symptoms today Strength training completed today  Goals Unmet:  Not Applicable  Comments: Checkout at 1200.   Dr. Carlyle Dolly is Medical Director for [facility]

## 2022-08-09 NOTE — Telephone Encounter (Signed)
Ok to resume regular activities including driving  Zandra Abts MD

## 2022-08-09 NOTE — Telephone Encounter (Signed)
I spoke with daughter and relayed Dr.Branch's message

## 2022-08-10 ENCOUNTER — Ambulatory Visit (HOSPITAL_COMMUNITY): Payer: Medicare HMO

## 2022-08-10 ENCOUNTER — Encounter (HOSPITAL_COMMUNITY)
Admission: RE | Admit: 2022-08-10 | Discharge: 2022-08-10 | Disposition: A | Discharge status: Home / Self Care | Payer: Medicare HMO | Source: Ambulatory Visit | Attending: Cardiology | Admitting: Cardiology

## 2022-08-10 DIAGNOSIS — I214 Non-ST elevation (NSTEMI) myocardial infarction: Secondary | ICD-10-CM | POA: Diagnosis not present

## 2022-08-10 DIAGNOSIS — Z955 Presence of coronary angioplasty implant and graft: Secondary | ICD-10-CM

## 2022-08-10 NOTE — Progress Notes (Signed)
Daily Session Note  Patient Details  Name: Sean Miranda MRN: [id number] Date of Birth: 07-02-1930 Referring Provider:   Flowsheet Row CARDIAC REHAB PHASE II ORIENTATION from 07/27/2022 in [place]  Referring Provider Dr. Alvester Chou       Encounter Date: 08/10/2022  Check In:  Session Check In - 08/10/22 1059       Check-In   Supervising physician immediately available to respond to emergencies [facility] MD immediately available    Physician(s) Dr. Gilman Schmidt    Location [facility]    Staff Present Redge Gainer, BS, Exercise Physiologist;Jacqueleen Pulver Wynetta Emery, RN, Joanette Gula, RN, BSN    Virtual Visit No    Medication changes reported     No    Fall or balance concerns reported    Yes    Comments Patient has history of a fall a few weeks ago falling out of bed.    Tobacco Cessation No Change    Warm-up and Cool-down Performed as group-led instruction    Resistance Training Performed Yes    VAD Patient? No    PAD/SET Patient? No      Pain Assessment   Currently in Pain? No/denies    Pain Score 0-No pain    Multiple Pain Sites No             Capillary Blood Glucose: No results found for this or any previous visit (from the past 24 hour(s)).    Social History   Tobacco Use  Smoking Status Former   Packs/day: 1.00   Years: 18.00   Total pack years: 18.00   Types: Cigarettes   Quit date: 12/03/1972   Years since quitting: 49.7  Smokeless Tobacco Never    Goals Met:  Independence with exercise equipment Exercise tolerated well No report of concerns or symptoms today Strength training completed today  Goals Unmet:  Not Applicable  Comments: Check out 1200.   Dr. Carlyle Dolly is Medical Director for [facility]

## 2022-08-13 ENCOUNTER — Encounter (HOSPITAL_COMMUNITY)
Admission: RE | Admit: 2022-08-13 | Discharge: 2022-08-13 | Disposition: A | Discharge status: Home / Self Care | Payer: Medicare HMO | Source: Ambulatory Visit | Attending: Cardiology | Admitting: Cardiology

## 2022-08-13 ENCOUNTER — Ambulatory Visit (HOSPITAL_COMMUNITY): Payer: Medicare HMO

## 2022-08-13 VITALS — Wt 189.8 lb

## 2022-08-13 DIAGNOSIS — I214 Non-ST elevation (NSTEMI) myocardial infarction: Secondary | ICD-10-CM | POA: Diagnosis not present

## 2022-08-13 DIAGNOSIS — Z955 Presence of coronary angioplasty implant and graft: Secondary | ICD-10-CM

## 2022-08-13 NOTE — Progress Notes (Signed)
Daily Session Note  Patient Details  Name: Sean Miranda MRN: [id number] Date of Birth: 02/28/1930 Referring Provider:   Flowsheet Row CARDIAC REHAB PHASE II ORIENTATION from 07/27/2022 in [place]  Referring Provider Dr. Alvester Chou       Encounter Date: 08/13/2022  Check In:  Session Check In - 08/13/22 1100       Check-In   Supervising physician immediately available to respond to emergencies [facility] MD immediately available    Physician(s) Dr. Domenic Polite    Location [facility]    Staff Present Redge Gainer, BS, Exercise Physiologist;Ilissa Rosner Wynetta Emery, RN, Joanette Gula, RN, BSN    Virtual Visit No    Medication changes reported     No    Fall or balance concerns reported    Yes    Comments Patient has history of a fall a few weeks ago falling out of bed.    Tobacco Cessation No Change    Warm-up and Cool-down Performed as group-led instruction    Resistance Training Performed Yes    VAD Patient? No    PAD/SET Patient? No      Pain Assessment   Currently in Pain? No/denies    Pain Score 0-No pain    Multiple Pain Sites No             Capillary Blood Glucose: No results found for this or any previous visit (from the past 24 hour(s)).    Social History   Tobacco Use  Smoking Status Former   Packs/day: 1.00   Years: 18.00   Total pack years: 18.00   Types: Cigarettes   Quit date: 12/03/1972   Years since quitting: 49.7  Smokeless Tobacco Never    Goals Met:  Independence with exercise equipment Exercise tolerated well No report of concerns or symptoms today Strength training completed today  Goals Unmet:  Not Applicable  Comments: Check out 1200.   Dr. Carlyle Dolly is Medical Director for [facility]

## 2022-08-15 ENCOUNTER — Encounter (HOSPITAL_COMMUNITY)
Admission: RE | Admit: 2022-08-15 | Discharge: 2022-08-15 | Disposition: A | Discharge status: Home / Self Care | Payer: Medicare HMO | Source: Ambulatory Visit | Attending: Cardiology | Admitting: Cardiology

## 2022-08-15 ENCOUNTER — Ambulatory Visit (HOSPITAL_COMMUNITY): Payer: Medicare HMO

## 2022-08-15 DIAGNOSIS — Z955 Presence of coronary angioplasty implant and graft: Secondary | ICD-10-CM

## 2022-08-15 DIAGNOSIS — I214 Non-ST elevation (NSTEMI) myocardial infarction: Secondary | ICD-10-CM | POA: Diagnosis not present

## 2022-08-15 NOTE — Progress Notes (Signed)
Daily Session Note  Patient Details  Name: Sean Miranda MRN: [id number] Date of Birth: 1930-08-06 Referring Provider:   Flowsheet Row CARDIAC REHAB PHASE II ORIENTATION from 07/27/2022 in [place]  Referring Provider Dr. Alvester Chou       Encounter Date: 08/15/2022  Check In:  Session Check In - 08/15/22 1053       Check-In   Supervising physician immediately available to respond to emergencies [facility] immediately available    Physician(s) Dr. Domenic Polite    Location [facility]    Staff Present Redge Gainer, BS, Exercise Physiologist;Louis Ivery Hassell Done, RN, Bjorn Loser, MS, ACSM-CEP, Exercise Physiologist    Virtual Visit No    Medication changes reported     No    Fall or balance concerns reported    Yes    Comments Patient has history of a fall a few weeks ago falling out of bed.    Tobacco Cessation No Change    Warm-up and Cool-down Performed as group-led instruction    Resistance Training Performed Yes    VAD Patient? No    PAD/SET Patient? No      Pain Assessment   Currently in Pain? No/denies    Pain Score 0-No pain    Multiple Pain Sites No             Capillary Blood Glucose: No results found for this or any previous visit (from the past 24 hour(s)).    Social History   Tobacco Use  Smoking Status Former   Packs/day: 1.00   Years: 18.00   Total pack years: 18.00   Types: Cigarettes   Quit date: 12/03/1972   Years since quitting: 49.7  Smokeless Tobacco Never    Goals Met:  Independence with exercise equipment Using PLB without cueing & demonstrates good technique Exercise tolerated well No report of concerns or symptoms today Strength training completed today  Goals Unmet:  Not Applicable  Comments: Checkout at 1200.   Dr. Carlyle Dolly is Medical Director for [facility]

## 2022-08-15 NOTE — Progress Notes (Signed)
Cardiac Individual Treatment Plan  Patient Details  Name: Sean Miranda MRN: [id number] Date of Birth: 1930-01-07 Referring Provider:   Flowsheet Row CARDIAC REHAB PHASE II ORIENTATION from 07/27/2022 in [facility]  Referring Provider Dr. Alvester Chou       Initial Encounter Date:  Flowsheet Row CARDIAC REHAB PHASE II ORIENTATION from 07/27/2022 in [facility]  Date 07/27/22       Visit Diagnosis: S/P primary angioplasty with coronary stent  NSTEMI (non-ST elevated myocardial infarction) ([place])  Patient's Home Medications on Admission:  Current Outpatient Medications:    ALPHA LIPOIC ACID PO, Take 250 mg by mouth daily.  (Patient not taking: Reported on 07/27/2022), Disp: , Rfl:    amLODipine (NORVASC) 2.5 MG tablet, Take 1 tablet (2.5 mg total) by mouth daily. (Patient not taking: Reported on 07/27/2022), Disp: 30 tablet, Rfl: 11   apixaban (ELIQUIS) 5 MG TABS tablet, TAKE (1) TABLET BY MOUTH TWICE DAILY., Disp: 60 tablet, Rfl: 6   clopidogrel (PLAVIX) 75 MG tablet, Take 1 tablet (75 mg total) by mouth daily with breakfast., Disp: 30 tablet, Rfl: 11   Coenzyme Q10 (COQ10) 400 MG CAPS, Take 800 mg by mouth in the morning and at bedtime. , Disp: , Rfl:    dofetilide (TIKOSYN) 250 MCG capsule, TAKE (1) CAPSULE BY MOUTH TWICE DAILY., Disp: 30 capsule, Rfl: 2   ezetimibe (ZETIA) 10 MG tablet, Take 1 tablet (10 mg total) by mouth daily. (Patient not taking: Reported on 07/27/2022), Disp: 90 tablet, Rfl: 3   Magnesium 500 MG TABS, Take 500 mg by mouth daily. , Disp: , Rfl:    Multiple Vitamin (MULTIVITAMIN WITH MINERALS) TABS tablet, Take 1 tablet by mouth daily., Disp: , Rfl:    nitroGLYCERIN (NITROSTAT) 0.4 MG SL tablet, Place 1 tablet (0.4 mg total) under the tongue every 5 (five) minutes as needed for chest pain., Disp: 25 tablet, Rfl: 1   pantoprazole (PROTONIX) 40 MG tablet, Take 1 tablet (40 mg total) by mouth daily. (Patient not taking: Reported  on 07/27/2022), Disp: 30 tablet, Rfl: 11   sildenafil (REVATIO) 20 MG tablet, Take 2-3 tablets (40-60 mg total) by mouth as needed (sexual activity). Do not take in combination with nitro SL. (Patient not taking: Reported on 07/27/2022), Disp: , Rfl:    zinc gluconate 50 MG tablet, Take 50 mg by mouth daily., Disp: , Rfl:   Past Medical History: Past Medical History:  Diagnosis Date   Arthritis    neck    Atrial fibrillation ([place])    a. new-onset in 01/2017. Started on Eliquis   CAD (coronary artery disease), native coronary artery 05/16/2017   LAD 95%>>0 w/ 2.5 x 16 mm Synergy DES, D2 40%, RCA 50%, EF 55-65%, LVEDP mod elevated   Calcification of coronary artery    a. mild, nonobstructive CAD by cath in 2009.   Cancer of skin of chest    Carotid artery occlusion    a. s/p R CEA in 2014   Colon polyps    Hyperlipidemia    takes Pravastatin every other day   Pneumonia ~ 01/2013    Tobacco Use: Social History   Tobacco Use  Smoking Status Former   Packs/day: 1.00   Years: 18.00   Total pack years: 18.00   Types: Cigarettes   Quit date: 12/03/1972   Years since quitting: 49.7  Smokeless Tobacco Never    Labs: Review Flowsheet  More data exists      Latest Ref  Rng & Units 05/12/2018 05/06/2019 10/07/2019 01/28/2020 07/09/2022  Labs for ITP Cardiac and Pulmonary Rehab  Cholestrol 0 - 200 mg/dL 195  203  243     197  201   LDL (calc) 0 - 99 mg/dL 141  141  179     138  151   HDL-C >40 mg/dL 39.60  48.40  44     45  38   Trlycerides <150 mg/dL 73.0  67.0  86     71  59   Hemoglobin A1c 4.8 - 5.6 % 5.5  - - 5.2  5.1     Details       This result is from an external source.         Capillary Blood Glucose: Lab Results  Component Value Date   GLUCAP 75 02/05/2019   GLUCAP 136 (H) 06/29/2017     Exercise Target Goals: Exercise Program Goal: Individual exercise prescription set using results from initial 6 min walk test and THRR while considering  patient's activity  barriers and safety.   Exercise Prescription Goal: Starting with aerobic activity 30 plus minutes a day, 3 days per week for initial exercise prescription. Provide home exercise prescription and guidelines that participant acknowledges understanding prior to discharge.  Activity Barriers & Risk Stratification:  Activity Barriers & Cardiac Risk Stratification - 07/27/22 1311       Activity Barriers & Cardiac Risk Stratification   Activity Barriers Shortness of Breath    Cardiac Risk Stratification High             6 Minute Walk:  6 Minute Walk     Row Name 07/27/22 1349         6 Minute Walk   Phase Initial     Distance 1150 feet     Walk Time 6 minutes     # of Rest Breaks 0     MPH 2.17     METS 1.47     RPE 12     VO2 Peak 5.17     Symptoms No     Resting HR 55 bpm     Resting BP 114/78     Resting Oxygen Saturation  97 %     Exercise Oxygen Saturation  during 6 min walk 98 %     Max Ex. HR 73 bpm     Max Ex. BP 130/70     2 Minute Post BP 110/72              Oxygen Initial Assessment:   Oxygen Re-Evaluation:   Oxygen Discharge (Final Oxygen Re-Evaluation):   Initial Exercise Prescription:  Initial Exercise Prescription - 07/27/22 1300       Date of Initial Exercise RX and Referring Provider   Date 07/27/22    Referring Provider Dr. Alvester Chou    Expected Discharge Date 10/17/22      Treadmill   MPH 2.1    Grade 0    Minutes 17      Recumbant Elliptical   Level 1    RPM 45    Minutes 22      Prescription Details   Frequency (times per week) 3    Duration Progress to 30 minutes of continuous aerobic without signs/symptoms of physical distress      Intensity   THRR 40-80% of Max Heartrate 52-104    Ratings of Perceived Exertion 11-13      Resistance Training   Training Prescription Yes  Weight 3    Reps 10-15             Perform Capillary Blood Glucose checks as needed.  Exercise Prescription Changes:   Exercise  Prescription Changes     Row Name 07/30/22 1200 08/13/22 1200           Response to Exercise   Blood Pressure (Admit) 124/66 126/62      Blood Pressure (Exercise) 138/60 136/60      Blood Pressure (Exit) 110/60 122/64      Heart Rate (Admit) 49 bpm 59 bpm      Heart Rate (Exercise) 81 bpm 81 bpm      Heart Rate (Exit) 58 bpm 68 bpm      Rating of Perceived Exertion (Exercise) 13 12      Duration Continue with 30 min of aerobic exercise without signs/symptoms of physical distress. Continue with 30 min of aerobic exercise without signs/symptoms of physical distress.      Intensity THRR unchanged THRR unchanged        Progression   Progression Continue to progress workloads to maintain intensity without signs/symptoms of physical distress. Continue to progress workloads to maintain intensity without signs/symptoms of physical distress.        Resistance Training   Training Prescription Yes Yes      Weight 3 4      Reps 10-15 10-15      Time 10 Minutes 10 Minutes        Treadmill   MPH 2.1 2.3      Grade 0 0      Minutes 17 17      METs 2.61 2.76        Recumbant Elliptical   Level 1 2      RPM 56 53      Minutes 22 22      METs 4 3.1               Exercise Comments:   Exercise Goals and Review:   Exercise Goals     Row Name 07/27/22 1353 08/13/22 1245           Exercise Goals   Increase Physical Activity Yes Yes      Intervention Provide advice, education, support and counseling about physical activity/exercise needs.;Develop an individualized exercise prescription for aerobic and resistive training based on initial evaluation findings, risk stratification, comorbidities and participant's personal goals. Provide advice, education, support and counseling about physical activity/exercise needs.;Develop an individualized exercise prescription for aerobic and resistive training based on initial evaluation findings, risk stratification, comorbidities and  participant's personal goals.      Expected Outcomes Short Term: Attend rehab on a regular basis to increase amount of physical activity.;Long Term: Add in home exercise to make exercise part of routine and to increase amount of physical activity.;Long Term: Exercising regularly at least 3-5 days a week. Short Term: Attend rehab on a regular basis to increase amount of physical activity.;Long Term: Add in home exercise to make exercise part of routine and to increase amount of physical activity.;Long Term: Exercising regularly at least 3-5 days a week.      Increase Strength and Stamina Yes Yes      Intervention Provide advice, education, support and counseling about physical activity/exercise needs.;Develop an individualized exercise prescription for aerobic and resistive training based on initial evaluation findings, risk stratification, comorbidities and participant's personal goals. Provide advice, education, support and counseling about physical activity/exercise needs.;Develop an individualized exercise prescription  for aerobic and resistive training based on initial evaluation findings, risk stratification, comorbidities and participant's personal goals.      Expected Outcomes Short Term: Increase workloads from initial exercise prescription for resistance, speed, and METs.;Short Term: Perform resistance training exercises routinely during rehab and add in resistance training at home;Long Term: Improve cardiorespiratory fitness, muscular endurance and strength as measured by increased METs and functional capacity (6MWT) Short Term: Increase workloads from initial exercise prescription for resistance, speed, and METs.;Short Term: Perform resistance training exercises routinely during rehab and add in resistance training at home;Long Term: Improve cardiorespiratory fitness, muscular endurance and strength as measured by increased METs and functional capacity (6MWT)      Able to understand and use rate of  perceived exertion (RPE) scale Yes Yes      Intervention Provide education and explanation on how to use RPE scale Provide education and explanation on how to use RPE scale      Expected Outcomes Short Term: Able to use RPE daily in rehab to express subjective intensity level;Long Term:  Able to use RPE to guide intensity level when exercising independently Short Term: Able to use RPE daily in rehab to express subjective intensity level;Long Term:  Able to use RPE to guide intensity level when exercising independently      Knowledge and understanding of Target Heart Rate Range (THRR) Yes Yes      Intervention Provide education and explanation of THRR including how the numbers were predicted and where they are located for reference Provide education and explanation of THRR including how the numbers were predicted and where they are located for reference      Expected Outcomes Short Term: Able to state/look up THRR;Long Term: Able to use THRR to govern intensity when exercising independently;Short Term: Able to use daily as guideline for intensity in rehab Short Term: Able to state/look up THRR;Long Term: Able to use THRR to govern intensity when exercising independently;Short Term: Able to use daily as guideline for intensity in rehab      Able to check pulse independently Yes Yes      Intervention Provide education and demonstration on how to check pulse in carotid and radial arteries.;Review the importance of being able to check your own pulse for safety during independent exercise Provide education and demonstration on how to check pulse in carotid and radial arteries.;Review the importance of being able to check your own pulse for safety during independent exercise      Expected Outcomes Short Term: Able to explain why pulse checking is important during independent exercise;Long Term: Able to check pulse independently and accurately Short Term: Able to explain why pulse checking is important during  independent exercise;Long Term: Able to check pulse independently and accurately      Understanding of Exercise Prescription Yes Yes      Intervention Provide education, explanation, and written materials on patient's individual exercise prescription Provide education, explanation, and written materials on patient's individual exercise prescription      Expected Outcomes Short Term: Able to explain program exercise prescription;Long Term: Able to explain home exercise prescription to exercise independently Short Term: Able to explain program exercise prescription;Long Term: Able to explain home exercise prescription to exercise independently               Exercise Goals Re-Evaluation :  Exercise Goals Re-Evaluation     [place] Name 08/13/22 1245             Exercise Goal Re-Evaluation  Exercise Goals Review Increase Physical Activity;Increase Strength and Stamina;Able to understand and use rate of perceived exertion (RPE) scale;Knowledge and understanding of Target Heart Rate Range (THRR);Able to check pulse independently;Understanding of Exercise Prescription       Comments Pt has completed 7 sessions of cardiac rehab. He is motivated in class and has increased his workload. He is cuurently exercising at 3.1 METs on the Ellp. Will continue to monitor and progress as able.       Expected Outcomes Through exercise at rehab and at home, pt will meet their stated goals.                 Discharge Exercise Prescription (Final Exercise Prescription Changes):  Exercise Prescription Changes - 08/13/22 1200       Response to Exercise   Blood Pressure (Admit) 126/62    Blood Pressure (Exercise) 136/60    Blood Pressure (Exit) 122/64    Heart Rate (Admit) 59 bpm    Heart Rate (Exercise) 81 bpm    Heart Rate (Exit) 68 bpm    Rating of Perceived Exertion (Exercise) 12    Duration Continue with 30 min of aerobic exercise without signs/symptoms of physical distress.    Intensity THRR  unchanged      Progression   Progression Continue to progress workloads to maintain intensity without signs/symptoms of physical distress.      Resistance Training   Training Prescription Yes    Weight 4    Reps 10-15    Time 10 Minutes      Treadmill   MPH 2.3    Grade 0    Minutes 17    METs 2.76      Recumbant Elliptical   Level 2    RPM 53    Minutes 22    METs 3.1             Nutrition:  Target Goals: Understanding of nutrition guidelines, daily intake of sodium '1500mg'$ , cholesterol '200mg'$ , calories 30% from fat and 7% or less from saturated fats, daily to have 5 or more servings of fruits and vegetables.  Biometrics:  Pre Biometrics - 07/27/22 1355       Pre Biometrics   Height 6' (1.829 m)    Weight 86 kg    Waist Circumference 41 inches    Hip Circumference 42 inches    Waist to Hip Ratio 0.98 %    BMI (Calculated) 25.71    Triceps Skinfold 12 mm    % Body Fat 26.8 %    Grip Strength 31.1 kg    Flexibility 0 in    Single Leg Stand 11 seconds              Nutrition Therapy Plan and Nutrition Goals:  Nutrition Therapy & Goals - 07/27/22 1325       Personal Nutrition Goals   Comments Patient scored 18 on his diet assessment. Handout provided and explained regarding healthier choices. He says he is trying to eat heart healthy. He and his wife cook and they eat out some. We offer 2 educational sessions on heart healthy nutrition with handouts.      Intervention Plan   Intervention Nutrition handout(s) given to patient.    Expected Outcomes Short Term Goal: Understand basic principles of dietary content, such as calories, fat, sodium, cholesterol and nutrients.             Nutrition Assessments:  Nutrition Assessments - 07/27/22 1326  MEDFICTS Scores   Pre Score 18            MEDIFICTS Score Key: ?70 Need to make dietary changes  40-70 Heart Healthy Diet ? 40 Therapeutic Level Cholesterol Diet   Picture Your Plate  Scores: <94 Unhealthy dietary pattern with much room for improvement. 41-50 Dietary pattern unlikely to meet recommendations for good health and room for improvement. 51-60 More healthful dietary pattern, with some room for improvement.  >60 Healthy dietary pattern, although there may be some specific behaviors that could be improved.    Nutrition Goals Re-Evaluation:   Nutrition Goals Discharge (Final Nutrition Goals Re-Evaluation):   Psychosocial: Target Goals: Acknowledge presence or absence of significant depression and/or stress, maximize coping skills, provide positive support system. Participant is able to verbalize types and ability to use techniques and skills needed for reducing stress and depression.  Initial Review & Psychosocial Screening:  Initial Psych Review & Screening - 07/27/22 1331       Initial Review   Current issues with None Identified      Family Dynamics   Good Support System? Yes      Barriers   Psychosocial barriers to participate in program There are no identifiable barriers or psychosocial needs.      Screening Interventions   Interventions Encouraged to exercise;Provide feedback about the scores to participant    Expected Outcomes Short Term goal: Identification and review with participant of any Quality of Life or Depression concerns found by scoring the questionnaire.             Quality of Life Scores:  Quality of Life - 07/27/22 1358       Quality of Life   Select Quality of Life      Quality of Life Scores   Health/Function Pre 26.21 %    Socioeconomic Pre 29.64 %    Psych/Spiritual Pre 28.79 %    Family Pre 27 %    GLOBAL Pre 27.61 %            Scores of 19 and below usually indicate a poorer quality of life in these areas.  A difference of  2-3 points is a clinically meaningful difference.  A difference of 2-3 points in the total score of the Quality of Life Index has been associated with significant improvement in  overall quality of life, self-image, physical symptoms, and general health in studies assessing change in quality of life.  PHQ-9: Review Flowsheet  More data exists      07/27/2022 05/06/2019 04/30/2018 12/21/2016 10/31/2015  Depression screen PHQ 2/9  Decreased Interest 0 0 0 0 0 0  Down, Depressed, Hopeless 0 0 0 0 0 0  PHQ - 2 Score 0 0 0 0 0 0  Altered sleeping 0 - - - -  Tired, decreased energy 1 - - - -  Change in appetite 0 - - - -  Feeling bad or failure about yourself  0 - - - -  Trouble concentrating 0 - - - -  Moving slowly or fidgety/restless 0 - - - -  Suicidal thoughts 0 - - - -  PHQ-9 Score 1 - - - -  Difficult doing work/chores Not difficult at all - - - -   Interpretation of Total Score  Total Score Depression Severity:  1-4 = Minimal depression, 5-9 = Mild depression, 10-14 = Moderate depression, 15-19 = Moderately severe depression, 20-27 = Severe depression   Psychosocial Evaluation and Intervention:  Psychosocial Evaluation -  07/27/22 1342       Psychosocial Evaluation & Interventions   Interventions Stress management education;Relaxation education;Encouraged to exercise with the program and follow exercise prescription    Comments Patient has no psychosocial barriers or issues identified at his orientation visit. His PHQ-9 score was 1 due to lack of energy. He denies any depression or anxiety. He says he is a very happy man. He lives with his wife of 54 years. His first wife passed away with cancer. They have 3 children together. 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 He says he goes in to work everyday but his sons are the main overseers of his business. He names his wife and children as his emotional support. He is not sure how much of the program he wants to do. He does have a $40 copayment but says this is not an issue. He has a treadmill at home and says once he learns what to do, he can do it on his  own at home. I ask him to commit to at leaast half of the program, 18 sessions. He said he would "see how it goes". He says he has 90% of his energy and stamina back and wants to get back to 100%. He is ready to get started.    Expected Outcomes Patient will continue to have no psychosocial barriers or issues identified.    Continue Psychosocial Services  No Follow up required             Psychosocial Re-Evaluation:  Psychosocial Re-Evaluation     [place] Name 08/09/22 2202             Psychosocial Re-Evaluation   Current issues with None Identified       Comments Patient is new to the program completing 4 sessions. He continues to have no psychosocial barriers identified. He seems to enjoy the sessions and demonstrates an interest in improving his health. We will continue to monitor his progress.       Expected Outcomes Patient will continue to have no psychosocial barriers identified.       Interventions Stress management education;Encouraged to attend Cardiac Rehabilitation for the exercise;Relaxation education       Continue Psychosocial Services  No Follow up required                Psychosocial Discharge (Final Psychosocial Re-Evaluation):  Psychosocial Re-Evaluation - 08/09/22 5427       Psychosocial Re-Evaluation   Current issues with None Identified    Comments Patient is new to the program completing 4 sessions. He continues to have no psychosocial barriers identified. He seems to enjoy the sessions and demonstrates an interest in improving his health. We will continue to monitor his progress.    Expected Outcomes Patient will continue to have no psychosocial barriers identified.    Interventions Stress management education;Encouraged to attend Cardiac Rehabilitation for the exercise;Relaxation education    Continue Psychosocial Services  No Follow up required             Vocational Rehabilitation: Provide vocational rehab assistance to qualifying  candidates.   Vocational Rehab Evaluation & Intervention:  Vocational Rehab - 07/27/22 1328       Initial Vocational Rehab Evaluation & Intervention   Assessment shows need for Vocational Rehabilitation No      Vocational Rehab Re-Evaulation   Comments Patient continues to work and does not need vocational rehab.  Education: Education Goals: Education classes will be provided on a weekly basis, covering required topics. Participant will state understanding/return demonstration of topics presented.  Learning Barriers/Preferences:  Learning Barriers/Preferences - 07/27/22 1327       Learning Barriers/Preferences   Learning Barriers None    Learning Preferences Written Material             Education Topics: Hypertension, Hypertension Reduction -Define heart disease and high blood pressure. Discus how high blood pressure affects the body and ways to reduce high blood pressure.   Exercise and Your Heart -Discuss why it is important to exercise, the FITT principles of exercise, normal and abnormal responses to exercise, and how to exercise safely.   Angina -Discuss definition of angina, causes of angina, treatment of angina, and how to decrease risk of having angina.   Cardiac Medications -Review what the following cardiac medications are used for, how they affect the body, and side effects that may occur when taking the medications.  Medications include Aspirin, Beta blockers, calcium channel blockers, ACE Inhibitors, angiotensin receptor blockers, diuretics, digoxin, and antihyperlipidemics.   Congestive Heart Failure -Discuss the definition of CHF, how to live with CHF, the signs and symptoms of CHF, and how keep track of weight and sodium intake.   Heart Disease and Intimacy -Discus the effect sexual activity has on the heart, how changes occur during intimacy as we age, and safety during sexual activity.   Smoking Cessation / COPD -Discuss  different methods to quit smoking, the health benefits of quitting smoking, and the definition of COPD.   Nutrition I: Fats -Discuss the types of cholesterol, what cholesterol does to the heart, and how cholesterol levels can be controlled.   Nutrition II: Labels -Discuss the different components of food labels and how to read food label [place] from 08/08/2022 in [facility]  Date 08/08/22  Educator DM  Instruction Review Code 1- Verbalizes Understanding       Heart Parts/Heart Disease and PAD -Discuss the anatomy of the heart, the pathway of blood circulation through the heart, and these are affected by heart disease.   Stress I: Signs and Symptoms -Discuss the causes of stress, how stress may lead to anxiety and depression, and ways to limit stress.   Stress II: Relaxation -Discuss different types of relaxation techniques to limit stress.   Warning Signs of Stroke / TIA -Discuss definition of a stroke, what the signs and symptoms are of a stroke, and how to identify when someone is having stroke.   Knowledge Questionnaire Score:  Knowledge Questionnaire Score - 07/27/22 1328       Knowledge Questionnaire Score   Pre Score 20/24             Core Components/Risk Factors/Patient Goals at Admission:  Personal Goals and Risk Factors at Admission - 07/27/22 1328       Core Components/Risk Factors/Patient Goals on Admission    Weight Management Weight Maintenance    Hypertension Yes    Intervention Provide education on lifestyle modifcations including regular physical activity/exercise, weight management, moderate sodium restriction and increased consumption of fresh fruit, vegetables, and low fat dairy, alcohol moderation, and smoking cessation.    Expected Outcomes Short Term: Continued assessment and intervention until BP is < 140/14m HG in hypertensive participants. < 130/854mHG in hypertensive  participants with diabetes, heart failure or chronic kidney disease.;Long Term: Maintenance of blood pressure at goal levels.    Lipids Yes  Intervention Provide education and support for participant on nutrition & aerobic/resistive exercise along with prescribed medications to achieve LDL '70mg'$ , HDL >'40mg'$ .    Expected Outcomes Short Term: Participant states understanding of desired cholesterol values and is compliant with medications prescribed. Participant is following exercise prescription and nutrition guidelines.;Long Term: Cholesterol controlled with medications as prescribed, with individualized exercise RX and with personalized nutrition plan. Value goals: LDL < '70mg'$ , HDL > 40 mg.    Personal Goal Other Yes    Personal Goal Patient wants to improve his strength and stamina and get back to his activities.    Intervention Patient will attend CR 3 days/week with exercise and education and supplement with exercise at home.    Expected Outcomes Patient will complete the program meeting both personal and program goals.             Core Components/Risk Factors/Patient Goals Review:   Goals and Risk Factor Review     Row Name 08/09/22 0923             Core Components/Risk Factors/Patient Goals Review   Personal Goals Review Weight Management/Obesity;Hypertension;Lipids;Other       Review Patient was referred to CR with Angioplasty with stent placement and NSTEMI. He has multiple risk factors for CAD and is participating in the program for risk modification.  He has ompleted 4 sessions. His blood pressure is well controlled. His personal goals for the program are to improve his strength and stamina and get back to his activities especially at work. We will continue to monitor his progress as he works towards meeting these goals.       Expected Outcomes Patient will complete the program meeting both personal and program goals.                Core Components/Risk Factors/Patient  Goals at Discharge (Final Review):   Goals and Risk Factor Review - 08/09/22 0923       Core Components/Risk Factors/Patient Goals Review   Personal Goals Review Weight Management/Obesity;Hypertension;Lipids;Other    Review Patient was referred to CR with Angioplasty with stent placement and NSTEMI. He has multiple risk factors for CAD and is participating in the program for risk modification.  He has ompleted 4 sessions. His blood pressure is well controlled. His personal goals for the program are to improve his strength and stamina and get back to his activities especially at work. We will continue to monitor his progress as he works towards meeting these goals.    Expected Outcomes Patient will complete the program meeting both personal and program goals.             ITP Comments:   Comments: ITP REVIEW Pt is making expected progress toward Cardiac Rehab goals after completing 7 sessions. Recommend continued exercise, life style modification, education, and increased stamina and strength.

## 2022-08-17 ENCOUNTER — Ambulatory Visit (HOSPITAL_COMMUNITY): Payer: Medicare HMO

## 2022-08-17 ENCOUNTER — Encounter (HOSPITAL_COMMUNITY)
Admission: RE | Admit: 2022-08-17 | Discharge: 2022-08-17 | Disposition: A | Discharge status: Home / Self Care | Payer: Medicare HMO | Source: Ambulatory Visit | Attending: Cardiology | Admitting: Cardiology

## 2022-08-17 DIAGNOSIS — I214 Non-ST elevation (NSTEMI) myocardial infarction: Secondary | ICD-10-CM

## 2022-08-17 DIAGNOSIS — Z955 Presence of coronary angioplasty implant and graft: Secondary | ICD-10-CM | POA: Diagnosis not present

## 2022-08-17 NOTE — Progress Notes (Signed)
Daily Session Note  Patient Details  Name: Sean Miranda MRN: [id number] Date of Birth: [birth year] Referring Provider:   Flowsheet Row CARDIAC REHAB PHASE II ORIENTATION from 07/27/2022 in [place]  Referring Provider Dr. Alvester Chou       Encounter Date: 08/17/2022  Check In:  Session Check In - 08/17/22 1100       Check-In   Supervising physician immediately available to respond to emergencies [facility] MD immediately available    Physician(s) Dr. Gasper Sells    Location [facility]    Staff Present Hoy Register, MS, ACSM-CEP, Exercise Physiologist;Debra Wynetta Emery, RN, BSN    Virtual Visit No    Medication changes reported     No    Fall or balance concerns reported    Yes    Comments Patient has history of a fall a few weeks ago falling out of bed.    Tobacco Cessation No Change    Warm-up and Cool-down Performed as group-led instruction    Resistance Training Performed Yes    VAD Patient? No    PAD/SET Patient? No      Pain Assessment   Currently in Pain? No/denies    Pain Score 0-No pain    Multiple Pain Sites No             Capillary Blood Glucose: No results found for this or any previous visit (from the past 24 hour(s)).    Social History   Tobacco Use  Smoking Status Former   Packs/day: 1.00   Years: 18.00   Total pack years: 18.00   Types: Cigarettes   Quit date: 12/03/1972   Years since quitting: 49.7  Smokeless Tobacco Never    Goals Met:  Independence with exercise equipment Exercise tolerated well No report of concerns or symptoms today Strength training completed today  Goals Unmet:  Not Applicable  Comments: checkout time Is 1200   Dr. Carlyle Dolly is Medical Director for [facility]

## 2022-08-20 ENCOUNTER — Ambulatory Visit (HOSPITAL_COMMUNITY): Payer: Medicare HMO

## 2022-08-20 ENCOUNTER — Encounter (HOSPITAL_COMMUNITY): Payer: Medicare HMO

## 2022-08-22 ENCOUNTER — Ambulatory Visit (HOSPITAL_COMMUNITY): Payer: Medicare HMO

## 2022-08-22 ENCOUNTER — Ambulatory Visit (HOSPITAL_COMMUNITY)
Admission: RE | Admit: 2022-08-22 | Discharge: 2022-08-22 | Disposition: A | Discharge status: Home / Self Care | Payer: Medicare HMO | Source: Ambulatory Visit | Attending: Internal Medicine | Admitting: Internal Medicine

## 2022-08-22 ENCOUNTER — Other Ambulatory Visit (HOSPITAL_COMMUNITY): Payer: Self-pay | Admitting: Internal Medicine

## 2022-08-22 ENCOUNTER — Encounter (HOSPITAL_COMMUNITY): Payer: Medicare HMO

## 2022-08-22 DIAGNOSIS — R0789 Other chest pain: Secondary | ICD-10-CM | POA: Diagnosis not present

## 2022-08-22 DIAGNOSIS — R079 Chest pain, unspecified: Secondary | ICD-10-CM | POA: Insufficient documentation

## 2022-08-22 NOTE — Progress Notes (Signed)
Discharge Progress Report  Patient Details  Name: Sean Miranda MRN: [id number] Date of Birth: Dec 20, 1929 Referring Provider:   Flowsheet Row CARDIAC REHAB PHASE II ORIENTATION from 07/27/2022 in [place]  Referring Provider Dr. Alvester Chou        Number of Visits: 9  Reason for Discharge:  Early Exit:  Personal  Smoking History:  Social History   Tobacco Use  Smoking Status Former   Packs/day: 1.00   Years: 18.00   Total pack years: 18.00   Types: Cigarettes   Quit date: 12/03/1972   Years since quitting: 49.7  Smokeless Tobacco Never    Diagnosis:  S/P primary angioplasty with coronary stent  NSTEMI (non-ST elevated myocardial infarction) ([place])  ADL [facility]:   Initial Exercise Prescription:  Initial Exercise Prescription - 07/27/22 1300       Date of Initial Exercise RX and Referring Provider   Date 07/27/22    Referring Provider Dr. Alvester Chou    Expected Discharge Date 10/17/22      Treadmill   MPH 2.1    Grade 0    Minutes 17      Recumbant Elliptical   Level 1    RPM 45    Minutes 22      Prescription Details   Frequency (times per week) 3    Duration Progress to 30 minutes of continuous aerobic without signs/symptoms of physical distress      Intensity   THRR 40-80% of Max Heartrate 52-104    Ratings of Perceived Exertion 11-13      Resistance Training   Training Prescription Yes    Weight 3    Reps 10-15             Discharge Exercise Prescription (Final Exercise Prescription Changes):  Exercise Prescription Changes - 08/13/22 1200       Response to Exercise   Blood Pressure (Admit) 126/62    Blood Pressure (Exercise) 136/60    Blood Pressure (Exit) 122/64    Heart Rate (Admit) 59 bpm    Heart Rate (Exercise) 81 bpm    Heart Rate (Exit) 68 bpm    Rating of Perceived Exertion (Exercise) 12    Duration Continue with 30 min of aerobic exercise without signs/symptoms of physical distress.    Intensity THRR  unchanged      Progression   Progression Continue to progress workloads to maintain intensity without signs/symptoms of physical distress.      Resistance Training   Training Prescription Yes    Weight 4    Reps 10-15    Time 10 Minutes      Treadmill   MPH 2.3    Grade 0    Minutes 17    METs 2.76      Recumbant Elliptical   Level 2    RPM 53    Minutes 22    METs 3.1             Functional Capacity:  6 Minute Walk     Row Name 07/27/22 1349         6 Minute Walk   Phase Initial     Distance 1150 feet     Walk Time 6 minutes     # of Rest Breaks 0     MPH 2.17     METS 1.47     RPE 12     VO2 Peak 5.17     Symptoms No     Resting HR  55 bpm     Resting BP 114/78     Resting Oxygen Saturation  97 %     Exercise Oxygen Saturation  during 6 min walk 98 %     Max Ex. HR 73 bpm     Max Ex. BP 130/70     2 Minute Post BP 110/72              Psychological, QOL, Others - Outcomes: PHQ 2/9:    07/27/2022    1:24 PM 05/06/2019    8:32 AM 04/30/2018   10:29 AM 12/21/2016    2:54 PM 10/31/2015    5:31 PM  Depression screen PHQ 2/9  Decreased Interest 0 0 0 0 0  Down, Depressed, Hopeless 0 0 0 0 0  PHQ - 2 Score 0 0 0 0 0  Altered sleeping 0      Tired, decreased energy 1      Change in appetite 0      Feeling bad or failure about yourself  0      Trouble concentrating 0      Moving slowly or fidgety/restless 0      Suicidal thoughts 0      PHQ-9 Score 1      Difficult doing work/chores Not difficult at all        Quality of Life:  Quality of Life - 07/27/22 1358       Quality of Life   Select Quality of Life      Quality of Life Scores   Health/Function Pre 26.21 %    Socioeconomic Pre 29.64 %    Psych/Spiritual Pre 28.79 %    Family Pre 27 %    GLOBAL Pre 27.61 %             Personal Goals: Goals established at orientation with interventions provided to work toward goal.  Personal Goals and Risk Factors at Admission - 07/27/22  1328       Core Components/Risk Factors/Patient Goals on Admission    Weight Management Weight Maintenance    Hypertension Yes    Intervention Provide education on lifestyle modifcations including regular physical activity/exercise, weight management, moderate sodium restriction and increased consumption of fresh fruit, vegetables, and low fat dairy, alcohol moderation, and smoking cessation.    Expected Outcomes Short Term: Continued assessment and intervention until BP is < 140/99m HG in hypertensive participants. < 130/836mHG in hypertensive participants with diabetes, heart failure or chronic kidney disease.;Long Term: Maintenance of blood pressure at goal levels.    Lipids Yes    Intervention Provide education and support for participant on nutrition & aerobic/resistive exercise along with prescribed medications to achieve LDL '70mg'$ , HDL >'40mg'$ .    Expected Outcomes Short Term: Participant states understanding of desired cholesterol values and is compliant with medications prescribed. Participant is following exercise prescription and nutrition guidelines.;Long Term: Cholesterol controlled with medications as prescribed, with individualized exercise RX and with personalized nutrition plan. Value goals: LDL < '70mg'$ , HDL > 40 mg.    Personal Goal Other Yes    Personal Goal Patient wants to improve his strength and stamina and get back to his activities.    Intervention Patient will attend CR 3 days/week with exercise and education and supplement with exercise at home.    Expected Outcomes Patient will complete the program meeting both personal and program goals.              Personal Goals Discharge:  Goals and  Risk Factor Review     Row Name 08/09/22 8242             Core Components/Risk Factors/Patient Goals Review   Personal Goals Review Weight Management/Obesity;Hypertension;Lipids;Other       Review Patient was referred to CR with Angioplasty with stent placement and NSTEMI.  He has multiple risk factors for CAD and is participating in the program for risk modification.  He has ompleted 4 sessions. His blood pressure is well controlled. His personal goals for the program are to improve his strength and stamina and get back to his activities especially at work. We will continue to monitor his progress as he works towards meeting these goals.       Expected Outcomes Patient will complete the program meeting both personal and program goals.                Exercise Goals and Review:  Exercise Goals     Row Name 07/27/22 1353 08/13/22 1245           Exercise Goals   Increase Physical Activity Yes Yes      Intervention Provide advice, education, support and counseling about physical activity/exercise needs.;Develop an individualized exercise prescription for aerobic and resistive training based on initial evaluation findings, risk stratification, comorbidities and participant's personal goals. Provide advice, education, support and counseling about physical activity/exercise needs.;Develop an individualized exercise prescription for aerobic and resistive training based on initial evaluation findings, risk stratification, comorbidities and participant's personal goals.      Expected Outcomes Short Term: Attend rehab on a regular basis to increase amount of physical activity.;Long Term: Add in home exercise to make exercise part of routine and to increase amount of physical activity.;Long Term: Exercising regularly at least 3-5 days a week. Short Term: Attend rehab on a regular basis to increase amount of physical activity.;Long Term: Add in home exercise to make exercise part of routine and to increase amount of physical activity.;Long Term: Exercising regularly at least 3-5 days a week.      Increase Strength and Stamina Yes Yes      Intervention Provide advice, education, support and counseling about physical activity/exercise needs.;Develop an individualized exercise  prescription for aerobic and resistive training based on initial evaluation findings, risk stratification, comorbidities and participant's personal goals. Provide advice, education, support and counseling about physical activity/exercise needs.;Develop an individualized exercise prescription for aerobic and resistive training based on initial evaluation findings, risk stratification, comorbidities and participant's personal goals.      Expected Outcomes Short Term: Increase workloads from initial exercise prescription for resistance, speed, and METs.;Short Term: Perform resistance training exercises routinely during rehab and add in resistance training at home;Long Term: Improve cardiorespiratory fitness, muscular endurance and strength as measured by increased METs and functional capacity (6MWT) Short Term: Increase workloads from initial exercise prescription for resistance, speed, and METs.;Short Term: Perform resistance training exercises routinely during rehab and add in resistance training at home;Long Term: Improve cardiorespiratory fitness, muscular endurance and strength as measured by increased METs and functional capacity (6MWT)      Able to understand and use rate of perceived exertion (RPE) scale Yes Yes      Intervention Provide education and explanation on how to use RPE scale Provide education and explanation on how to use RPE scale      Expected Outcomes Short Term: Able to use RPE daily in rehab to express subjective intensity level;Long Term:  Able to use RPE to guide intensity level when exercising  independently Short Term: Able to use RPE daily in rehab to express subjective intensity level;Long Term:  Able to use RPE to guide intensity level when exercising independently      Knowledge and understanding of Target Heart Rate Range (THRR) Yes Yes      Intervention Provide education and explanation of THRR including how the numbers were predicted and where they are located for reference  Provide education and explanation of THRR including how the numbers were predicted and where they are located for reference      Expected Outcomes Short Term: Able to state/look up THRR;Long Term: Able to use THRR to govern intensity when exercising independently;Short Term: Able to use daily as guideline for intensity in rehab Short Term: Able to state/look up THRR;Long Term: Able to use THRR to govern intensity when exercising independently;Short Term: Able to use daily as guideline for intensity in rehab      Able to check pulse independently Yes Yes      Intervention Provide education and demonstration on how to check pulse in carotid and radial arteries.;Review the importance of being able to check your own pulse for safety during independent exercise Provide education and demonstration on how to check pulse in carotid and radial arteries.;Review the importance of being able to check your own pulse for safety during independent exercise      Expected Outcomes Short Term: Able to explain why pulse checking is important during independent exercise;Long Term: Able to check pulse independently and accurately Short Term: Able to explain why pulse checking is important during independent exercise;Long Term: Able to check pulse independently and accurately      Understanding of Exercise Prescription Yes Yes      Intervention Provide education, explanation, and written materials on patient's individual exercise prescription Provide education, explanation, and written materials on patient's individual exercise prescription      Expected Outcomes Short Term: Able to explain program exercise prescription;Long Term: Able to explain home exercise prescription to exercise independently Short Term: Able to explain program exercise prescription;Long Term: Able to explain home exercise prescription to exercise independently               Exercise Goals Re-Evaluation:  Exercise Goals Re-Evaluation     Gregory Name  08/13/22 1245             Exercise Goal Re-Evaluation   Exercise Goals Review Increase Physical Activity;Increase Strength and Stamina;Able to understand and use rate of perceived exertion (RPE) scale;Knowledge and understanding of Target Heart Rate Range (THRR);Able to check pulse independently;Understanding of Exercise Prescription       Comments Pt has completed 7 sessions of cardiac rehab. He is motivated in class and has increased his workload. He is cuurently exercising at 3.1 METs on the Ellp. Will continue to monitor and progress as able.       Expected Outcomes Through exercise at rehab and at home, pt will meet their stated goals.                Nutrition & Weight - Outcomes:  Pre Biometrics - 07/27/22 1355       Pre Biometrics   Height 6' (1.829 m)    Weight 189 lb 9.5 oz (86 kg)    Waist Circumference 41 inches    Hip Circumference 42 inches    Waist to Hip Ratio 0.98 %    BMI (Calculated) 25.71    Triceps Skinfold 12 mm    % Body Fat 26.8 %  Grip Strength 31.1 kg    Flexibility 0 in    Single Leg Stand 11 seconds              Nutrition:  Nutrition Therapy & Goals - 07/27/22 1325       Personal Nutrition Goals   Comments Patient scored 18 on his diet assessment. Handout provided and explained regarding healthier choices. He says he is trying to eat heart healthy. He and his wife cook and they eat out some. We offer 2 educational sessions on heart healthy nutrition with handouts.      Intervention Plan   Intervention Nutrition handout(s) given to patient.    Expected Outcomes Short Term Goal: Understand basic principles of dietary content, such as calories, fat, sodium, cholesterol and nutrients.             Nutrition Discharge:  Nutrition Assessments - 07/27/22 1326       MEDFICTS Scores   Pre Score 18             Education Questionnaire Score:  Knowledge Questionnaire Score - 07/27/22 1328       Knowledge Questionnaire Score    Pre Score 20/24             Pt discharged from CR after 9 sessions. Pt stated at his last visit on 08/17/2022 that he has exercise equipment at home and will use that for rehab.

## 2022-08-24 ENCOUNTER — Ambulatory Visit (HOSPITAL_COMMUNITY): Payer: Medicare HMO

## 2022-08-24 ENCOUNTER — Encounter (HOSPITAL_COMMUNITY): Payer: Medicare HMO

## 2022-08-27 ENCOUNTER — Ambulatory Visit (HOSPITAL_COMMUNITY): Payer: Medicare HMO

## 2022-08-27 ENCOUNTER — Encounter (HOSPITAL_COMMUNITY): Payer: Medicare HMO

## 2022-08-29 ENCOUNTER — Encounter (HOSPITAL_COMMUNITY): Payer: Medicare HMO

## 2022-08-29 ENCOUNTER — Ambulatory Visit (HOSPITAL_COMMUNITY): Payer: Medicare HMO

## 2022-08-30 ENCOUNTER — Other Ambulatory Visit: Payer: Self-pay

## 2022-08-30 ENCOUNTER — Emergency Department (HOSPITAL_COMMUNITY): Payer: Medicare HMO

## 2022-08-30 ENCOUNTER — Encounter (HOSPITAL_COMMUNITY): Payer: Self-pay | Admitting: *Deleted

## 2022-08-30 ENCOUNTER — Emergency Department (HOSPITAL_COMMUNITY)
Admission: EM | Admit: 2022-08-30 | Discharge: 2022-08-30 | Disposition: A | Discharge status: Home / Self Care | Payer: Medicare HMO | Attending: Student | Admitting: Student

## 2022-08-30 DIAGNOSIS — R109 Unspecified abdominal pain: Secondary | ICD-10-CM | POA: Insufficient documentation

## 2022-08-30 DIAGNOSIS — Z85828 Personal history of other malignant neoplasm of skin: Secondary | ICD-10-CM | POA: Diagnosis not present

## 2022-08-30 DIAGNOSIS — I2510 Atherosclerotic heart disease of native coronary artery without angina pectoris: Secondary | ICD-10-CM | POA: Insufficient documentation

## 2022-08-30 DIAGNOSIS — Z87891 Personal history of nicotine dependence: Secondary | ICD-10-CM | POA: Insufficient documentation

## 2022-08-30 DIAGNOSIS — T1490XA Injury, unspecified, initial encounter: Secondary | ICD-10-CM | POA: Diagnosis not present

## 2022-08-30 DIAGNOSIS — Z7901 Long term (current) use of anticoagulants: Secondary | ICD-10-CM | POA: Diagnosis not present

## 2022-08-30 DIAGNOSIS — Z79899 Other long term (current) drug therapy: Secondary | ICD-10-CM | POA: Diagnosis not present

## 2022-08-30 DIAGNOSIS — I4891 Unspecified atrial fibrillation: Secondary | ICD-10-CM | POA: Insufficient documentation

## 2022-08-30 DIAGNOSIS — R079 Chest pain, unspecified: Secondary | ICD-10-CM | POA: Insufficient documentation

## 2022-08-30 DIAGNOSIS — N281 Cyst of kidney, acquired: Secondary | ICD-10-CM | POA: Diagnosis not present

## 2022-08-30 DIAGNOSIS — R0789 Other chest pain: Secondary | ICD-10-CM | POA: Diagnosis not present

## 2022-08-30 LAB — CBC
HCT: 38.1 % — ABNORMAL LOW (ref 39.0–52.0)
Hemoglobin: 13 g/dL (ref 13.0–17.0)
MCH: 31.9 pg (ref 26.0–34.0)
MCHC: 34.1 g/dL (ref 30.0–36.0)
MCV: 93.6 fL (ref 80.0–100.0)
Platelets: 198 10*3/uL (ref 150–400)
RBC: 4.07 MIL/uL — ABNORMAL LOW (ref 4.22–5.81)
RDW: 13.1 % (ref 11.5–15.5)
WBC: 5.3 10*3/uL (ref 4.0–10.5)
nRBC: 0 % (ref 0.0–0.2)

## 2022-08-30 LAB — BASIC METABOLIC PANEL
Anion gap: 6 (ref 5–15)
BUN: 14 mg/dL (ref 8–23)
CO2: 26 mmol/L (ref 22–32)
Calcium: 8.5 mg/dL — ABNORMAL LOW (ref 8.9–10.3)
Chloride: 103 mmol/L (ref 98–111)
Creatinine, Ser: 1.05 mg/dL (ref 0.61–1.24)
GFR, Estimated: >60 mL/min (ref >60)
Glucose, Bld: 89 mg/dL (ref 70–99)
Potassium: 4.6 mmol/L (ref 3.5–5.1)
Sodium: 135 mmol/L (ref 135–145)

## 2022-08-30 LAB — TROPONIN I (HIGH SENSITIVITY)
Troponin I (High Sensitivity): 8 ng/L (ref <18)
Troponin I (High Sensitivity): 7 ng/L (ref <18)

## 2022-08-30 MED ORDER — AMLODIPINE BESYLATE 2.5 MG PO TABS: 2.5000 mg | ORAL_TABLET | Freq: Every day | ORAL | 1 refills | Status: DC

## 2022-08-30 MED ORDER — IOHEXOL 300 MG/ML  SOLN
100.0000 mL | Freq: Once | INTRAMUSCULAR | Status: AC | PRN
  Administered 2022-08-30: 100 mL via INTRAVENOUS

## 2022-08-30 NOTE — Progress Notes (Signed)
Cardiology Consultation   Patient ID: DEKKER VERGA MRN: [id number]; DOB: 08-09-1930  Admit date: 08/30/2022 Date of Consult: 08/30/2022  PCP:  Asencion Noble, [place] Providers Cardiologist:  Carlyle Dolly, MD        Patient Profile:   Sean Miranda is a 88 y.o. male with a hx of CAD with recent RCA stent 07/2022 who is being seen 08/30/2022 for the evaluation of chset pain at the request of Dr Matilde Sprang.  History of Present Illness:   Sean Miranda 88 yo male history of afib on dofetilide and eliquis, CAD with DES to LAD in 05/2017. Admit 07/2022 with unstable angina. 07/2022 cath: LM 25%, diatal LM/ostial LAD 70% borderline RFR, prox LAD 50%, ostial RCA 90%, mid RCA 50%, acute marginal 75%. DES to RCA.  History of carotid stenosis with prior CEA, HL, presents with chest pain   Had done well after recent stenting. No chest pains, had been exercising at home on treadmill without exertional symptoms. First episode of chest pain since PCI last night around midnight. 9/10 sharp pressing pain left sided, no other assocaited symptoms. He cannot recall if similar to prior angina. Waited 15 minutes, took NG x 1 then second dose 5 min later with resolution of pain. Went back to sleep without problems, aftre breakfast this AM decided to come in to ER to be evaluated.   K 4.6 Cr 1.05 BUN 14 WBC 5.3 Hgb 13 Plt 198  Trop 8--> EKG afib, RBBB, no ischemic changes       Past Medical History:  Diagnosis Date   Arthritis    neck    Atrial fibrillation ([place])    a. new-onset in 01/2017. Started on Eliquis   CAD (coronary artery disease), native coronary artery 05/16/2017   LAD 95%>>0 w/ 2.5 x 16 mm Synergy DES, D2 40%, RCA 50%, EF 55-65%, LVEDP mod elevated   Calcification of coronary artery    a. mild, nonobstructive CAD by cath in 2009.   Cancer of skin of chest    Carotid artery occlusion    a. s/p R CEA in 2014   Colon polyps    Hyperlipidemia    takes Pravastatin  every other day   Pneumonia ~ 01/2013    Past Surgical History:  Procedure Laterality Date   CARDIAC CATHETERIZATION  06/09/1999   mild CAD (LAD, diagonal, RCA) - Dr. Rockne Menghini   CARDIOVERSION N/A 03/25/2017   Procedure: CARDIOVERSION;  Surgeon: Pixie Casino, MD;  Location: [facility] ENDOSCOPY;  Service: Cardiovascular;  Laterality: N/A;   CARDIOVERSION N/A 09/28/2020   Procedure: CARDIOVERSION;  Surgeon: [name], MD;  Location: [facility] ENDOSCOPY;  Service: Cardiovascular;  Laterality: N/A;   CAROTID DOPPLER  02/2012   60-79% RICA stenosis, 81-27% LICA stenosis (prior to carotid endarterectomy)   COLONOSCOPY     CORONARY ATHERECTOMY N/A 07/09/2022   Procedure: CORONARY ATHERECTOMY;  Surgeon: Sherren Mocha, MD;  Location: [place] [facility];  Service: Cardiovascular;  Laterality: N/A;   CORONARY STENT INTERVENTION N/A 05/16/2017   Procedure: Coronary Stent Intervention;  Surgeon: Jettie Booze, MD;  Location: [place] [facility];  Service: Cardiovascular;  Laterality: N/A;   CORONARY STENT INTERVENTION N/A 07/09/2022   Procedure: CORONARY STENT INTERVENTION;  Surgeon: Sherren Mocha, MD;  Location: [place] [facility];  Service: Cardiovascular;  Laterality: N/A;   ENDARTERECTOMY Right 11/10/2013   Procedure: ENDARTERECTOMY CAROTID;  Surgeon: Conrad Urbanna, MD;  Location: [place];  Service: Vascular;  Laterality: Right;   INGUINAL HERNIA REPAIR Left    INTRAVASCULAR PRESSURE WIRE/FFR STUDY N/A 07/09/2022   Procedure: INTRAVASCULAR PRESSURE WIRE/FFR STUDY;  Surgeon: Sherren Mocha, MD;  Location: [place] [facility];  Service: Cardiovascular;  Laterality: N/A;   INTRAVASCULAR ULTRASOUND/IVUS N/A 07/09/2022   Procedure: Intravascular Ultrasound/IVUS;  Surgeon: Sherren Mocha, MD;  Location: [place] [facility];  Service: Cardiovascular;  Laterality: N/A;   LEFT HEART CATH AND CORONARY ANGIOGRAPHY N/A 05/16/2017   Procedure: Left Heart Cath and Coronary Angiography;  Surgeon: Jettie Booze, MD;   Location: [place] [facility];  Service: Cardiovascular;  Laterality: N/A;   LEFT HEART CATH AND CORONARY ANGIOGRAPHY N/A 07/09/2022   Procedure: LEFT HEART CATH AND CORONARY ANGIOGRAPHY;  Surgeon: Sherren Mocha, MD;  Location: [place] [facility];  Service: Cardiovascular;  Laterality: N/A;   MOLE REMOVAL  1990s   "chest"   NM MYOCAR PERF WALL MOTION  04/2011   bruce myoview - perfusion defect in inferior myocardium (diaphragmatic attenuation), remaining myocardium with normal perfusion, EF 67%   PATCH ANGIOPLASTY Right 11/10/2013   Procedure: PATCH ANGIOPLASTY;  Surgeon: Conrad Wilcox, MD;  Location: [place];  Service: Vascular;  Laterality: Right;   PILONIDAL CYST DRAINAGE     SHOULDER OPEN ROTATOR CUFF REPAIR Bilateral [place]  ~ 2015   "chest"   TEMPORARY PACEMAKER N/A 07/09/2022   Procedure: TEMPORARY PACEMAKER;  Surgeon: Sherren Mocha, MD;  Location: [place] [facility];  Service: Cardiovascular;  Laterality: N/A;   TONSILLECTOMY        Inpatient Medications: Scheduled Meds:  Continuous Infusions:  PRN Meds:   Allergies:    Allergies  Allergen Reactions   Lipitor [Atorvastatin Calcium] Other (See Comments)    Muscle pain   Vantin [Cefpodoxime] Rash    Social History:   Social History   Socioeconomic History   Marital status: Married    Spouse name: Not on file   Number of children: 3   Years of education: Not on file   Highest education level: Not on file  Occupational History    Employer: [employer]  Tobacco Use   Smoking status: Former    Packs/day: 1.00    Years: 18.00    Total pack years: 18.00    Types: Cigarettes    Quit date: 12/03/1972    Years since quitting: 49.7   Smokeless tobacco: Never  Vaping Use   Vaping Use: Never used  Substance and Sexual Activity   Alcohol use: Yes    Alcohol/week: 14.0 standard drinks of alcohol    Types: 7 Glasses of wine, 7 Shots of liquor per week   Drug use: No   Sexual activity: Yes   Other Topics Concern   Not on file  Social History Narrative   Not on file   Social Determinants of Health   Financial Resource Strain: Not on file  Food Insecurity: Not on file  Transportation Needs: Not on file  Physical Activity: Not on file  Stress: Not on file  Social Connections: Not on file  Intimate Partner Violence: Not on file    Family History:    Family History  Problem Relation Age of Onset   Stroke Father    Hypertension Mother    Prostate cancer Son    Stroke Brother    Cancer Brother        spine     ROS:  Please see the history of present illness.  All other ROS reviewed and negative.     Physical Exam/Data:   Vitals:   08/30/22 1207 08/30/22 1215 08/30/22 1230 08/30/22 1245  BP:  112/64 (!) 114/57 124/69  Pulse:  71 84 75  Resp:  '14 17 17  '$ Temp: 98 F (36.7 C)     TempSrc: Oral     SpO2:  98% 95% 99%  Weight:      Height:       No intake or output data in the 24 hours ending 08/30/22 1309    08/30/2022   11:11 AM 08/13/2022   11:00 AM 07/30/2022   11:00 AM  Last 3 Weights  Weight (lbs) 189 lb 13.1 oz 189 lb 13.1 oz 190 lb 7.6 oz  Weight (kg) 86.1 kg 86.1 kg 86.4 kg     Body mass index is 25.74 kg/m.  General:  Well nourished, well developed, in no acute distress HEENT: normal Neck: no JVD Vascular: No carotid bruits; Distal pulses 2+ bilaterally Cardiac:  irreg, no m/rg, no jvd Lungs:  clear to auscultation bilaterally, no wheezing, rhonchi or rales  Abd: soft, nontender, no hepatomegaly  Ext: no edema Musculoskeletal:  No deformities, BUE and BLE strength normal and equal Skin: warm and dry  Neuro:  CNs 2-12 intact, no focal abnormalities noted Psych:  Normal affect     Laboratory Data:  High Sensitivity Troponin:   Recent Labs  Lab 08/30/22 1058  TROPONINIHS 8     Chemistry Recent Labs  Lab 08/30/22 1058  NA 135  K 4.6  CL 103  CO2 26  GLUCOSE 89  BUN 14  CREATININE 1.05  CALCIUM 8.5*  GFRNONAA >60   ANIONGAP 6    No results for input(s): "PROT", "ALBUMIN", "AST", "ALT", "ALKPHOS", "BILITOT" in the last 168 hours. Lipids No results for input(s): "CHOL", "TRIG", "HDL", "LABVLDL", "LDLCALC", "CHOLHDL" in the last 168 hours.  Hematology Recent Labs  Lab 08/30/22 1058  WBC 5.3  RBC 4.07*  HGB 13.0  HCT 38.1*  MCV 93.6  MCH 31.9  MCHC 34.1  RDW 13.1  PLT 198   Thyroid No results for input(s): "TSH", "FREET4" in the last 168 hours.  BNPNo results for input(s): "BNP", "PROBNP" in the last 168 hours.  DDimer No results for input(s): "DDIMER" in the last 168 hours.   Radiology/Studies:  DG Chest 2 View  Result Date: 08/30/2022 CLINICAL DATA:  Chest pain. EXAM: CHEST - 2 VIEW COMPARISON:  07/08/2022 FINDINGS: Heart size and mediastinal contours are unremarkable. No pleural effusion or edema. No airspace opacities identified. Review of the visualized osseous structures are unremarkable. IMPRESSION: No active cardiopulmonary abnormalities. Electronically Signed   By: Kerby Moors M.D.   On: 08/30/2022 11:45     Assessment and Plan:   1.CAD/chest pain - DES to LAD in 05/2017.  - Admit 07/2022 with unstable angina. 07/2022 cath: LM 25%, diatal LM/ostial LAD 70% borderline RFR, prox LAD 50%, ostial RCA 90%, mid RCA 50%, acute marginal 75%. DES to RCA  -isolated episode of chest pain over 14 hrs ago, trops negative without EKG chagnes. Perhaps related to residual small vessel disease.   - on plavix and eliquis (off ASA after 30 days triple therapy). Beta blocker stopped 07/2022 admission due to low HRs. He is on zetia '10mg'$  daily, prior statin allergy.   -as far as antianginals avoid beta blockers due to low HRs. On dofetilide would avoid ranexa. Soft bp's at time sbut would try starting back his norvasc 2.'5mg'$   daily. Uses sildenafil prn would avoid imdur.    2.Afib - on dofetilide, eliquis - EKG shows today rate controlled afib - continue dofetilide for now, if sustained afib at f/u  may consider alternative therapy. Perhaps lowering his afib burden as opposed to staying out of afib all together.7 day monitor recently did not show afib  - recent monitor with frequent PACs, rare runs SVT, brady at times. No afib noted.  - toprol was stopped 07/2022 admit due to low HRs   Ok for discharge from ER, would start back his norvasc 2.'5mg'$  daily.   For questions or updates, please contact [place] Please consult [email or web address] for contact info under    Signed, Carlyle Dolly, MD  08/30/2022 1:09 PM

## 2022-08-30 NOTE — ED Triage Notes (Signed)
Pt c/o chest pain that started last night; pt states the pain woke him up and he took 2 nitro that relieved the pain;

## 2022-08-30 NOTE — ED Provider Notes (Signed)
[facility] EMERGENCY DEPARTMENT Provider Note  CSN: [id number] Arrival date & time: 08/30/22 1020  Chief Complaint(s) Chest Pain  HPI Sean Miranda is a 88 y.o. male with PMH A-fib on flecainide and Eliquis, CAD status post recent catheterization on 07/09/2022 with DES placement who presents emergency department for evaluation of chest pain and right flank pain.  Patient states that he was woken from sleep with an episode of chest pain that resolved after taking home nitroglycerin.  His wife states that he also suffered a fall last week and has persistent right flank pain.  Patient did have a rib series x-ray that was negative for rib fracture after the fall.  Currently, the patient is chest pain-free with no shortness of breath, abdominal pain, nausea, vomiting or other systemic symptoms.   Past Medical History Past Medical History:  Diagnosis Date   Arthritis    neck    Atrial fibrillation ([place])    a. new-onset in 01/2017. Started on Eliquis   CAD (coronary artery disease), native coronary artery 05/16/2017   LAD 95%>>0 w/ 2.5 x 16 mm Synergy DES, D2 40%, RCA 50%, EF 55-65%, LVEDP mod elevated   Calcification of coronary artery    a. mild, nonobstructive CAD by cath in 2009.   Cancer of skin of chest    Carotid artery occlusion    a. 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 Authorizing Provider  ALPHA LIPOIC ACID PO Take 250 mg by mouth daily.  Patient not taking: Reported on 07/27/2022    [provider]  amLODipine (NORVASC) 2.5 MG tablet Take 1 tablet (2.5 mg total) by mouth daily. Patient not taking: Reported on 07/27/2022 07/10/22   Ledora Bottcher, PA  apixaban (ELIQUIS) 5 MG TABS tablet TAKE (1) TABLET BY MOUTH TWICE DAILY. 01/01/22   Evans Lance, MD  clopidogrel (PLAVIX) 75 MG tablet Take 1 tablet (75 mg total) by mouth daily with breakfast. 07/11/22   Duke, Tami Lin, PA  Coenzyme Q10 (COQ10) 400 MG CAPS Take 800 mg by mouth in the morning and at bedtime.     [provider]  dofetilide (TIKOSYN) 250 MCG capsule TAKE (1) CAPSULE BY MOUTH TWICE DAILY. 08/08/22   Evans Lance, MD  ezetimibe (ZETIA) 10 MG tablet Take 1 tablet (10 mg total) by mouth daily. Patient not taking: Reported on 07/27/2022 07/16/22   Elgie Collard, PA-C  Magnesium 500 MG TABS Take 500 mg by mouth daily.     [provider]  Multiple  Vitamin (MULTIVITAMIN WITH MINERALS) TABS tablet Take 1 tablet by mouth daily.    [provider]  nitroGLYCERIN (NITROSTAT) 0.4 MG SL tablet Place 1 tablet (0.4 mg total) under the tongue every 5 (five) minutes as needed for chest pain. 07/11/22   Arnoldo Lenis, MD  pantoprazole (PROTONIX) 40 MG tablet Take 1 tablet (40 mg total) by mouth daily. Patient not taking: Reported on 07/27/2022 07/11/22   Ledora Bottcher, PA  sildenafil (REVATIO) 20 MG tablet Take 2-3 tablets (40-60 mg total) by mouth as needed (sexual activity). Do not take in combination with nitro  SL. Patient not taking: Reported on 07/27/2022 07/10/22   Ledora Bottcher, PA  zinc gluconate 50 MG tablet Take 50 mg by mouth daily.    [provider]                                                                                                                                    Past Surgical History Past Surgical History:  Procedure Laterality Date   CARDIAC CATHETERIZATION  06/09/1999   mild CAD (LAD, diagonal, RCA) - Dr. Rockne Menghini   CARDIOVERSION N/A 03/25/2017   Procedure: CARDIOVERSION;  Surgeon: Pixie Casino, MD;  Location: [facility] ENDOSCOPY;  Service: Cardiovascular;  Laterality: N/A;   CARDIOVERSION N/A 09/28/2020   Procedure: CARDIOVERSION;  Surgeon: [name], MD;  Location: [place];  Service: Cardiovascular;  Laterality: N/A;   CAROTID DOPPLER  02/2012   60-79% RICA stenosis, 95-63% LICA stenosis (prior to carotid endarterectomy)   COLONOSCOPY     CORONARY ATHERECTOMY N/A 07/09/2022   Procedure: CORONARY ATHERECTOMY;  Surgeon: Sherren Mocha, MD;  Location: [place] CV LAB;  Service: Cardiovascular;  Laterality: N/A;   CORONARY STENT INTERVENTION N/A 05/16/2017   Procedure: Coronary Stent Intervention;  Surgeon: Jettie Booze, MD;  Location: [place] CV LAB;  Service: Cardiovascular;  Laterality: N/A;   CORONARY STENT INTERVENTION N/A 07/09/2022   Procedure: CORONARY STENT INTERVENTION;  Surgeon: Sherren Mocha, MD;  Location: [place] CV LAB;  Service: Cardiovascular;  Laterality: N/A;   ENDARTERECTOMY Right 11/10/2013   Procedure: ENDARTERECTOMY CAROTID;  Surgeon: Conrad Naschitti, MD;  Location: [place];  Service: Vascular;  Laterality: Right;   INGUINAL HERNIA REPAIR Left    INTRAVASCULAR PRESSURE WIRE/FFR STUDY N/A 07/09/2022   Procedure: INTRAVASCULAR PRESSURE WIRE/FFR STUDY;  Surgeon: Sherren Mocha, MD;  Location: [place] [facility] LAB;  Service: Cardiovascular;  Laterality: N/A;   INTRAVASCULAR ULTRASOUND/IVUS N/A 07/09/2022   Procedure:  Intravascular Ultrasound/IVUS;  Surgeon: Sherren Mocha, MD;  Location: [place] CV LAB;  Service: Cardiovascular;  Laterality: N/A;   LEFT HEART CATH AND CORONARY ANGIOGRAPHY N/A 05/16/2017   Procedure: Left Heart Cath and Coronary Angiography;  Surgeon: Jettie Booze, MD;  Location: [place] [facility] LAB;  Service: Cardiovascular;  Laterality: N/A;   LEFT HEART CATH AND CORONARY ANGIOGRAPHY N/A 07/09/2022   Procedure: LEFT  HEART CATH AND CORONARY ANGIOGRAPHY;  Surgeon: Sherren Mocha, MD;  Location: [place] CV LAB;  Service: Cardiovascular;  Laterality: N/A;   MOLE REMOVAL  1990s   "chest"   NM MYOCAR PERF WALL MOTION  04/2011   bruce myoview - perfusion defect in inferior myocardium (diaphragmatic attenuation), remaining myocardium with normal perfusion, EF 67%   PATCH ANGIOPLASTY Right 11/10/2013   Procedure: PATCH ANGIOPLASTY;  Surgeon: Conrad [place], MD;  Location: [place];  Service: Vascular;  Laterality: Right;   PILONIDAL CYST DRAINAGE     SHOULDER OPEN ROTATOR CUFF REPAIR Bilateral [place]  ~ 2015   "chest"   TEMPORARY PACEMAKER N/A 07/09/2022   Procedure: TEMPORARY PACEMAKER;  Surgeon: Sherren Mocha, MD;  Location: [place] CV LAB;  Service: Cardiovascular;  Laterality: N/A;   TONSILLECTOMY     Family History Family History  Problem Relation Age of Onset   Stroke Father    Hypertension Mother    Prostate cancer Son    Stroke Brother    Cancer Brother        spine    Social History Social History   Tobacco Use   Smoking status: Former    Packs/day: 1.00    Years: 18.00    Total pack years: 18.00    Types: Cigarettes    Quit date: 12/03/1972    Years since quitting: 49.7   Smokeless tobacco: Never  Vaping Use   Vaping Use: Never used  Substance Use Topics   Alcohol use: Yes    Alcohol/week: 14.0 standard drinks of alcohol    Types: 7 Glasses of wine, 7 Shots of liquor per week   Drug use: No   Allergies Lipitor [atorvastatin  calcium] and Vantin [cefpodoxime]  Review of Systems Review of Systems  Cardiovascular:  Positive for chest pain.  Genitourinary:  Positive for flank pain.    Physical Exam Vital Signs  I have reviewed the triage vital signs BP (!) 116/90   Pulse 64   Resp 12   Ht 6' (1.829 m)   Wt 86.1 kg   SpO2 97%   BMI 25.74 kg/m   Physical Exam Constitutional:      General: He is not in acute distress.    Appearance: Normal appearance.  HENT:     Head: Normocephalic and atraumatic.     Nose: No congestion or rhinorrhea.  Eyes:     General:        Right eye: No discharge.        Left eye: No discharge.     Extraocular Movements: Extraocular movements intact.     Pupils: Pupils are equal, round, and reactive to light.  Cardiovascular:     Rate and Rhythm: Normal rate and regular rhythm.     Heart sounds: No murmur heard. Pulmonary:     Effort: No respiratory distress.     Breath sounds: No wheezing or rales.  Abdominal:     General: There is no distension.     Tenderness: There is no abdominal tenderness. There is right CVA tenderness.  Musculoskeletal:        General: Tenderness present. Normal range of motion.     Cervical back: Normal range of motion.  Skin:    General: Skin is warm and dry.     Findings: Bruising present.  Neurological:     General: No focal deficit present.     Mental Status: He is alert.  ED Results and Treatments Labs (all labs ordered are listed, but only abnormal results are displayed) Labs Reviewed  BASIC METABOLIC PANEL - Abnormal; Notable for the following components:      Result Value   Calcium 8.5 (*)    All other components within normal limits  CBC - Abnormal; Notable for the following components:   RBC 4.07 (*)    HCT 38.1 (*)    All other components within normal limits  TROPONIN I (HIGH SENSITIVITY)                                                                                                                           Radiology DG Chest 2 View  Result Date: 08/30/2022 CLINICAL DATA:  Chest pain. EXAM: CHEST - 2 VIEW COMPARISON:  07/08/2022 FINDINGS: Heart size and mediastinal contours are unremarkable. No pleural effusion or edema. No airspace opacities identified. Review of the visualized osseous structures are unremarkable. IMPRESSION: No active cardiopulmonary abnormalities. Electronically Signed   By: Kerby Moors M.D.   On: 08/30/2022 11:45    Pertinent labs & imaging results that were available during my care of the patient were reviewed by me and considered in my medical decision making (see MDM for details).  Medications Ordered in ED Medications - No data to display                                                                                                                                   Procedures Procedures  (including critical care time)  Medical Decision Making / ED Course   This patient presents to the ED for concern of chest pain, flank bruising, this involves an extensive number of treatment options, and is a complaint that carries with it a high risk of complications and morbidity.  The differential diagnosis includes ACS, unstable angina, NSTEMI, pneumonia, PTX, rib fracture  MDM: Seen in the emergency room for evaluation of multiple complaints described above.  Physical exam with bruising to the lateral ribs on the right but is otherwise unremarkable.  Laboratory evaluation including troponin and delta troponin unremarkable.  ECG nonischemic.  Chest x-ray unremarkable.  CT abdomen pelvis obtained to evaluate the bruising which is reassuringly negative for acute intra-abdominal pathology.  There is a small infrarenal aneurysm that will require outpatient follow-up.  I sent a message to the patient's primary care physician about this finding.  I consulted cardiology given patient's elevated cardiac risk who came and evaluated the patient at bedside and is recommending initiation  of 2.5 mg Norvasc as an antianginal and follow-up outpatient.   Additional history obtained: -Additional history obtained from wife -External records from outside source obtained and reviewed including: Chart review including previous notes, labs, imaging, consultation notes   Lab Tests: -I ordered, reviewed, and interpreted labs.   The pertinent results include:   Labs Reviewed  BASIC METABOLIC PANEL - Abnormal; Notable for the following components:      Result Value   Calcium 8.5 (*)    All other components within normal limits  CBC - Abnormal; Notable for the following components:   RBC 4.07 (*)    HCT 38.1 (*)    All other components within normal limits  TROPONIN I (HIGH SENSITIVITY)      EKG   EKG Interpretation  Date/Time:  Thursday August 30 2022 10:48:58 EDT Ventricular Rate:  71 PR Interval:    QRS Duration: 155 QT Interval:  451 QTC Calculation: 491 R Axis:   94 Text Interpretation: Atrial fibrillation Right bundle branch block Confirmed by [place] ([id number]) on 08/30/2022 12:00:01 PM         Imaging Studies ordered: I ordered imaging studies including chest x-ray, CT abdomen pelvis I independently visualized and interpreted imaging. I agree with the radiologist interpretation   Medicines ordered and prescription drug management: No orders of the defined types were placed in this encounter.   -I have reviewed the patients home medicines and have made adjustments as needed  Critical interventions none  Consultations Obtained: I requested consultation with the cardiologist Dr. Harl Bowie,  and discussed lab and imaging findings as well as pertinent plan - they recommend: Norvasc initiation and outpatient follow-up   Cardiac Monitoring: The patient was maintained on a cardiac monitor.  I personally viewed and interpreted the cardiac monitored which showed an underlying rhythm of: Atrial fibrillation with right bundle branch block  Social  Determinants of Health:  Factors impacting patients care include: none   Reevaluation: After the interventions noted above, I reevaluated the patient and found that they have :improved  Co morbidities that complicate the patient evaluation  Past Medical History:  Diagnosis Date   Arthritis    neck    Atrial fibrillation ([place])    a. new-onset in 01/2017. Started on Eliquis   CAD (coronary artery disease), native coronary artery 05/16/2017   LAD 95%>>0 w/ 2.5 x 16 mm Synergy DES, D2 40%, RCA 50%, EF 55-65%, LVEDP mod elevated   Calcification of coronary artery    a. mild, nonobstructive CAD by cath in 2009.   Cancer of skin of chest    Carotid artery occlusion    a. s/p R CEA in 2014   Colon polyps    Hyperlipidemia    takes Pravastatin every other day   Pneumonia ~ 01/2013      Dispostion: I considered admission for this patient, but he currently does not meet inpatient criteria for admission is safe for discharge with outpatient follow-up     Final Clinical Impression(s) / ED Diagnoses Final diagnoses:  None     '@PCDICTATION'$ @    Teressa Lower, MD 08/30/22 1426

## 2022-08-31 ENCOUNTER — Encounter (HOSPITAL_COMMUNITY): Payer: Medicare HMO

## 2022-08-31 ENCOUNTER — Ambulatory Visit (HOSPITAL_COMMUNITY): Payer: Medicare HMO

## 2022-09-03 ENCOUNTER — Ambulatory Visit: Payer: Medicare HMO | Admitting: Cardiology

## 2022-09-03 ENCOUNTER — Ambulatory Visit (HOSPITAL_COMMUNITY): Payer: Medicare HMO

## 2022-09-03 ENCOUNTER — Encounter (HOSPITAL_COMMUNITY): Payer: Medicare HMO

## 2022-09-05 ENCOUNTER — Ambulatory Visit (HOSPITAL_COMMUNITY): Payer: Medicare HMO

## 2022-09-05 ENCOUNTER — Encounter (HOSPITAL_COMMUNITY): Payer: Medicare HMO

## 2022-09-07 ENCOUNTER — Ambulatory Visit (HOSPITAL_COMMUNITY): Payer: Medicare HMO

## 2022-09-07 ENCOUNTER — Encounter (HOSPITAL_COMMUNITY): Payer: Medicare HMO

## 2022-09-10 ENCOUNTER — Encounter (HOSPITAL_COMMUNITY): Payer: Medicare HMO

## 2022-09-10 ENCOUNTER — Ambulatory Visit (HOSPITAL_COMMUNITY): Payer: Medicare HMO

## 2022-09-11 ENCOUNTER — Other Ambulatory Visit: Payer: Self-pay | Admitting: Internal Medicine

## 2022-09-12 ENCOUNTER — Encounter (HOSPITAL_COMMUNITY): Payer: Medicare HMO

## 2022-09-12 ENCOUNTER — Ambulatory Visit (HOSPITAL_COMMUNITY): Payer: Medicare HMO

## 2022-09-14 ENCOUNTER — Ambulatory Visit (HOSPITAL_COMMUNITY): Payer: Medicare HMO

## 2022-09-14 ENCOUNTER — Encounter (HOSPITAL_COMMUNITY): Payer: Medicare HMO

## 2022-09-17 ENCOUNTER — Ambulatory Visit (HOSPITAL_COMMUNITY): Payer: Medicare HMO

## 2022-09-17 ENCOUNTER — Encounter (HOSPITAL_COMMUNITY): Payer: Medicare HMO

## 2022-09-19 ENCOUNTER — Ambulatory Visit (HOSPITAL_COMMUNITY): Payer: Medicare HMO

## 2022-09-19 ENCOUNTER — Encounter (HOSPITAL_COMMUNITY): Payer: Medicare HMO

## 2022-09-21 ENCOUNTER — Other Ambulatory Visit: Payer: Self-pay | Admitting: Internal Medicine

## 2022-09-21 ENCOUNTER — Encounter (HOSPITAL_COMMUNITY): Payer: Medicare HMO

## 2022-09-21 ENCOUNTER — Ambulatory Visit (HOSPITAL_COMMUNITY): Payer: Medicare HMO

## 2022-09-24 ENCOUNTER — Encounter (HOSPITAL_COMMUNITY): Payer: Medicare HMO

## 2022-09-24 ENCOUNTER — Ambulatory Visit (HOSPITAL_COMMUNITY): Payer: Medicare HMO

## 2022-09-26 ENCOUNTER — Encounter (HOSPITAL_COMMUNITY): Payer: Medicare HMO

## 2022-09-26 ENCOUNTER — Ambulatory Visit: Payer: Medicare HMO | Attending: Cardiology | Admitting: Pharmacist Clinician (PhC)/ Clinical Pharmacy Specialist

## 2022-09-26 ENCOUNTER — Ambulatory Visit (HOSPITAL_COMMUNITY): Payer: Medicare HMO

## 2022-09-26 ENCOUNTER — Encounter: Payer: Self-pay | Admitting: Pharmacist Clinician (PhC)/ Clinical Pharmacy Specialist

## 2022-09-26 DIAGNOSIS — E782 Mixed hyperlipidemia: Secondary | ICD-10-CM

## 2022-09-26 NOTE — Assessment & Plan Note (Signed)
Patient with ASCVD and elevated LDL cholesterol, currently not sure if taking ezetimibe or not.  Reviewed options for lowering LDL cholesterol, including ezetimibe, PCSK-9 inhibitors, bempedoic acid and inclisiran.  Discussed mechanisms of action, dosing, side effects and potential decreases in LDL cholesterol.  Also reviewed cost information and potential options for patient assistance.  Answered all patient questions.  Based on this information, patient would prefer to start [place].  Will have him take samples for 3 weeks, then check BMET and uric acid.  Should those be WNL will start PA process to get covered by his insurance.  He will continue with 1 tablet daily and repeat labs after 3 months on medication.

## 2022-09-26 NOTE — Progress Notes (Signed)
09/26/2022 Sean Miranda [birth year] [id number]   HPI:  Sean Miranda is a 88 y.o. male patient of Sean Miranda, who presents today for a lipid clinic evaluation.  See pertinent past medical history below.  He was hospitalized in August with diagnosis of unstable angina, and hear cath showed 90% ost RCA lesion that was stented with DES.   In 2019 he was seen by Sean Miranda for elevated cholesterol.  He was prescribed Repatha 140 mg injections, however patient never took these.  There was mention that he was concerned about his cholesterol going "too low" on medication.    Past Medical History: AF Persistent, on dofetilide 250 mcg, CHADS2-VASc = 4, on Eliquis  CAD 2018 DES to midLAD, 8/23 DES to ostial RCA  Carotid stenosis Bilateral, followed by [facility]    Insurance Carrier:  [employer] Plan   Current Medications:  ezetimibe 10 mg qd     Cholesterol Goals: LDL < 55   Intolerant/previously tried: atorvastatin, pravastatin, simvastatin       Family history: mother had heart disease, lived to 15; brother died at 21 also had heart disease; father died from stroke 64; 3 kids - youngest son with high cholesterol.    Diet: mostly home cooked meal; lot of fresh fruits and vegetables, berries most mornings, variety of proteins - beef, chicken fish; no snacking  Exercise:  treadmill, weights - three times per week. 7 min on treadmill  Lipid Panel     Component Value Date/Time   CHOL 201 (H) 07/09/2022 0607   CHOL 243 10/07/2019 0000   TRIG 59 07/09/2022 0607   TRIG 86 10/07/2019 0000   HDL 38 (L) 07/09/2022 0607   HDL 44 10/07/2019 0000   CHOLHDL 5.3 07/09/2022 0607   VLDL 12 07/09/2022 0607   LDLCALC 151 (H) 07/09/2022 0607   LDLDIRECT 149.3 01/05/2014 0940     Current Outpatient Medications  Medication Sig Dispense Refill   ALPHA LIPOIC ACID PO Take 250 mg by mouth daily.     clopidogrel (PLAVIX) 75 MG tablet Take 1 tablet (75 mg total) by mouth daily  with breakfast. 30 tablet 11   dofetilide (TIKOSYN) 250 MCG capsule TAKE (1) CAPSULE BY MOUTH TWICE DAILY. 30 capsule 0   ELIQUIS 5 MG TABS tablet TAKE (1) TABLET BY MOUTH TWICE DAILY. 60 tablet 6   ezetimibe (ZETIA) 10 MG tablet Take 1 tablet (10 mg total) by mouth daily. 90 tablet 3   Magnesium 500 MG TABS Take 500 mg by mouth daily.      memantine (NAMENDA) 5 MG tablet Take 5 mg by mouth daily.     Multiple Vitamin (MULTIVITAMIN WITH MINERALS) TABS tablet Take 1 tablet by mouth daily.     nitroGLYCERIN (NITROSTAT) 0.4 MG SL tablet Place 1 tablet (0.4 mg total) under the tongue every 5 (five) minutes as needed for chest pain. 25 tablet 1   sildenafil (REVATIO) 20 MG tablet Take 2-3 tablets (40-60 mg total) by mouth as needed (sexual activity). Do not take in combination with nitro SL.     zinc gluconate 50 MG tablet Take 50 mg by mouth daily.     amLODipine (NORVASC) 2.5 MG tablet Take 1 tablet (2.5 mg total) by mouth daily. (Patient not taking: Reported on 09/26/2022) 90 tablet 1   Coenzyme Q10 (COQ10) 400 MG CAPS Take 800 mg by mouth in the morning and at bedtime.      No current facility-administered medications for  this visit.    Allergies  Allergen Reactions   Lipitor [Atorvastatin Calcium] Other (See Comments)    Muscle pain   Vantin [Cefpodoxime] Rash    Past Medical History:  Diagnosis Date   Arthritis    neck    Atrial fibrillation ([place])    a. new-onset in 01/2017. Started on Eliquis   CAD (coronary artery disease), native coronary artery 05/16/2017   LAD 95%>>0 w/ 2.5 x 16 mm Synergy DES, D2 40%, RCA 50%, EF 55-65%, LVEDP mod elevated   Calcification of coronary artery    a. mild, nonobstructive CAD by cath in 2009.   Cancer of skin of chest    Carotid artery occlusion    a. s/p R CEA in 2014   Colon polyps    Hyperlipidemia    takes Pravastatin every other day   Pneumonia ~ 01/2013    There were no vitals taken for this visit.   Hyperlipidemia Patient with  ASCVD and elevated LDL cholesterol, currently not sure if taking ezetimibe or not.  Reviewed options for lowering LDL cholesterol, including ezetimibe, PCSK-9 inhibitors, bempedoic acid and inclisiran.  Discussed mechanisms of action, dosing, side effects and potential decreases in LDL cholesterol.  Also reviewed cost information and potential options for patient assistance.  Answered all patient questions.  Based on this information, patient would prefer to start [place].  Will have him take samples for 3 weeks, then check BMET and uric acid.  Should those be WNL will start PA process to get covered by his insurance.  He will continue with 1 tablet daily and repeat labs after 3 months on medication.     Sean Miranda PharmD CPP [facility] [address] [place], [place] [address] [phone]

## 2022-09-26 NOTE — Patient Instructions (Signed)
Your Results:             Your most recent labs Goal  Total Cholesterol 201 < 200  Triglycerides 59 < 150  HDL (happy/good cholesterol) 38 > 40  LDL (lousy/bad cholesterol 151 < 55   Medication changes:  Start Nexlizet 180/10 mg once daily.  Stop taking the ezetimibe 10 mg tablets.  At the end of 3 weeks, please go to the lab and have uric acid level drawn.    If all is well, I will do the prior authorization.   Once approved you will continue with 1 tablet daily.    Lab orders:  We want to repeat labs after 2-3 months.  We will send you a lab order to remind you once we get closer to that time.    Patient Assistance:  The [employer] foundation offers assistance to help pay for medication copays.  They will cover copays for all cholesterol lowering meds, including statins, fibrates, omega-3 oils, ezetimibe, Repatha, Praluent, Nexletol, Nexlizet.  The cards are usually good for $2,500 or 12 months, whichever comes first. Go to healthwellfoundation.org Click on "Apply Now" Answer questions as to whom is applying (patient or representative) Your disease fund will be "hypercholesterolemia - Medicare access" They will ask questions about finances and which medications you are taking for cholesterol When you submit, the approval is usually within minutes.  You will need to print the card information from the site You will need to show this information to your pharmacy, they will bill your Medicare Part D plan first -then bill [employer] --for the copay.   You can also call them at [phone], although the hold times can be quite long.   Thank you for choosing [facility]

## 2022-09-28 ENCOUNTER — Ambulatory Visit (HOSPITAL_COMMUNITY): Payer: Medicare HMO

## 2022-09-28 ENCOUNTER — Encounter (HOSPITAL_COMMUNITY): Payer: Medicare HMO

## 2022-10-01 ENCOUNTER — Encounter (HOSPITAL_COMMUNITY): Payer: Medicare HMO

## 2022-10-01 ENCOUNTER — Ambulatory Visit (HOSPITAL_COMMUNITY): Payer: Medicare HMO

## 2022-10-03 ENCOUNTER — Encounter (HOSPITAL_COMMUNITY): Payer: Medicare HMO

## 2022-10-03 ENCOUNTER — Ambulatory Visit (HOSPITAL_COMMUNITY): Payer: Medicare HMO

## 2022-10-05 ENCOUNTER — Ambulatory Visit (HOSPITAL_COMMUNITY): Payer: Medicare HMO

## 2022-10-05 ENCOUNTER — Encounter (HOSPITAL_COMMUNITY): Payer: Medicare HMO

## 2022-10-06 ENCOUNTER — Emergency Department (HOSPITAL_COMMUNITY): Payer: Medicare HMO

## 2022-10-06 ENCOUNTER — Emergency Department (HOSPITAL_COMMUNITY)
Admission: EM | Admit: 2022-10-06 | Discharge: 2022-10-06 | Disposition: A | Discharge status: Home / Self Care | Payer: Medicare HMO | Attending: Emergency Medicine | Admitting: Emergency Medicine

## 2022-10-06 ENCOUNTER — Encounter (HOSPITAL_COMMUNITY): Payer: Self-pay

## 2022-10-06 ENCOUNTER — Other Ambulatory Visit: Payer: Self-pay

## 2022-10-06 DIAGNOSIS — Z7901 Long term (current) use of anticoagulants: Secondary | ICD-10-CM | POA: Insufficient documentation

## 2022-10-06 DIAGNOSIS — R079 Chest pain, unspecified: Secondary | ICD-10-CM | POA: Diagnosis not present

## 2022-10-06 DIAGNOSIS — I2510 Atherosclerotic heart disease of native coronary artery without angina pectoris: Secondary | ICD-10-CM | POA: Diagnosis not present

## 2022-10-06 DIAGNOSIS — R0789 Other chest pain: Secondary | ICD-10-CM | POA: Diagnosis not present

## 2022-10-06 LAB — TROPONIN I (HIGH SENSITIVITY)
Troponin I (High Sensitivity): 4 ng/L (ref <18)
Troponin I (High Sensitivity): 4 ng/L (ref <18)

## 2022-10-06 LAB — CBC WITH DIFFERENTIAL/PLATELET
Abs Immature Granulocytes: 0.01 10*3/uL (ref 0.00–0.07)
Basophils Absolute: 0 10*3/uL (ref 0.0–0.1)
Basophils Relative: 0 %
Eosinophils Absolute: 0.3 10*3/uL (ref 0.0–0.5)
Eosinophils Relative: 5 %
HCT: 39.2 % (ref 39.0–52.0)
Hemoglobin: 13.7 g/dL (ref 13.0–17.0)
Immature Granulocytes: 0 %
Lymphocytes Relative: 32 %
Lymphs Abs: 1.7 10*3/uL (ref 0.7–4.0)
MCH: 32.3 pg (ref 26.0–34.0)
MCHC: 34.9 g/dL (ref 30.0–36.0)
MCV: 92.5 fL (ref 80.0–100.0)
Monocytes Absolute: 0.5 10*3/uL (ref 0.1–1.0)
Monocytes Relative: 9 %
Neutro Abs: 3 10*3/uL (ref 1.7–7.7)
Neutrophils Relative %: 54 %
Platelets: 233 10*3/uL (ref 150–400)
RBC: 4.24 MIL/uL (ref 4.22–5.81)
RDW: 13.1 % (ref 11.5–15.5)
WBC: 5.5 10*3/uL (ref 4.0–10.5)
nRBC: 0 % (ref 0.0–0.2)

## 2022-10-06 LAB — BASIC METABOLIC PANEL
Anion gap: 5 (ref 5–15)
BUN: 18 mg/dL (ref 8–23)
CO2: 28 mmol/L (ref 22–32)
Calcium: 9 mg/dL (ref 8.9–10.3)
Chloride: 105 mmol/L (ref 98–111)
Creatinine, Ser: 1.28 mg/dL — ABNORMAL HIGH (ref 0.61–1.24)
GFR, Estimated: 53 mL/min — ABNORMAL LOW (ref >60)
Glucose, Bld: 89 mg/dL (ref 70–99)
Potassium: 4.6 mmol/L (ref 3.5–5.1)
Sodium: 138 mmol/L (ref 135–145)

## 2022-10-06 NOTE — ED Provider Notes (Signed)
[facility] Provider Note   CSN: [id number] Arrival date & time: 10/06/22  1101     History {Add pertinent medical, surgical, social history, OB history to HPI:1} No chief complaint on file.   Sean Miranda is a 88 y.o. male.  HPI      Sean Miranda is a 88 y.o. male with past medical history of coronary artery disease, atrial fibrillation, currently anticoagulated on Eliquis.  He presents to the Emergency Department complaining of right-sided chest pain that woke him from sleep at 3 AM this morning.  He describes the pain as an aching sensation the right side of his chest.  He took 1 sublingual nitroglycerin that did not seem to help his pain.  He is unclear how long the pain lasted, but states when he woke this morning the pain had resolved.  His right-sided chest pain was not associated with any cough, known recent injury, or shortness of breath.  No arm pain or neck pain.  He denies any symptoms at present.  He underwent left heart cath and coronary angiography in August of this year with placement of 2 coronary stents.   Home Medications Prior to Admission medications   Medication Sig Start Date End Date Taking? Authorizing Provider  ALPHA LIPOIC ACID PO Take 250 mg by mouth daily.    [provider]  amLODipine (NORVASC) 2.5 MG tablet Take 1 tablet (2.5 mg total) by mouth daily. Patient not taking: Reported on 09/26/2022 08/30/22   Kommor, Debe Coder, MD  clopidogrel (PLAVIX) 75 MG tablet Take 1 tablet (75 mg total) by mouth daily with breakfast. 07/11/22   Duke, Tami Lin, PA  Coenzyme Q10 (COQ10) 400 MG CAPS Take 800 mg by mouth in the morning and at bedtime.     [provider]  dofetilide (TIKOSYN) 250 MCG capsule TAKE (1) CAPSULE BY MOUTH TWICE DAILY. 09/11/22   Evans Lance, MD  ELIQUIS 5 MG TABS tablet TAKE (1) TABLET BY MOUTH TWICE DAILY. 09/21/22   Evans Lance, MD  ezetimibe (ZETIA) 10 MG tablet Take 1 tablet (10 mg  total) by mouth daily. 07/16/22   Elgie Collard, PA-C  Magnesium 500 MG TABS Take 500 mg by mouth daily.     [provider]  memantine (NAMENDA) 5 MG tablet Take 5 mg by mouth daily. 08/17/22   [provider]  Multiple Vitamin (MULTIVITAMIN WITH MINERALS) TABS tablet Take 1 tablet by mouth daily.    [provider]  nitroGLYCERIN (NITROSTAT) 0.4 MG SL tablet Place 1 tablet (0.4 mg total) under the tongue every 5 (five) minutes as needed for chest pain. 07/11/22   Arnoldo Lenis, MD  sildenafil (REVATIO) 20 MG tablet Take 2-3 tablets (40-60 mg total) by mouth as needed (sexual activity). Do not take in combination with nitro SL. 07/10/22   Duke, Tami Lin, PA  zinc gluconate 50 MG tablet Take 50 mg by mouth daily.    [provider]      Allergies    Lipitor [atorvastatin calcium] and Vantin [cefpodoxime]    Review of Systems   Review of Systems  Constitutional:  Negative for appetite change, chills and fever.  Respiratory:  Negative for shortness of breath.   Cardiovascular:  Positive for chest pain. Negative for palpitations and leg swelling.  Gastrointestinal:  Negative for abdominal pain, nausea and vomiting.  Musculoskeletal:  Negative for back pain.  Skin:  Negative for rash.  Neurological:  Negative for dizziness,  seizures, syncope, weakness and headaches.    Physical Exam Updated Vital Signs BP (!) 136/56 (BP Location: Right Arm)   Pulse (!) 59   Temp 97.6 F (36.4 C) (Oral)   Resp 18   Ht 6' (1.829 m)   Wt 81.6 kg   SpO2 99%   BMI 24.41 kg/m  Physical Exam Vitals and nursing note reviewed.  Constitutional:      General: He is not in acute distress.    Appearance: Normal appearance. He is not ill-appearing or toxic-appearing.  Cardiovascular:     Rate and Rhythm: Normal rate and regular rhythm.     Pulses: Normal pulses.  Pulmonary:     Effort: Pulmonary effort is normal. No respiratory distress.     Breath sounds: No  stridor. No rales.  Abdominal:     Palpations: Abdomen is soft.     Tenderness: There is no abdominal tenderness.  Musculoskeletal:        General: Normal range of motion.     Cervical back: Normal range of motion.     Right lower leg: No edema.     Left lower leg: No edema.  Skin:    General: Skin is warm.     Capillary Refill: Capillary refill takes less than 2 seconds.  Neurological:     General: No focal deficit present.     Mental Status: He is alert.     Sensory: No sensory deficit.     Motor: No weakness.     ED Results / Procedures / Treatments   Labs (all labs ordered are listed, but only abnormal results are displayed) Labs Reviewed  BASIC METABOLIC PANEL - Abnormal; Notable for the following components:      Result Value   Creatinine, Ser 1.28 (*)    GFR, Estimated 53 (*)    All other components within normal limits  CBC WITH DIFFERENTIAL/PLATELET  TROPONIN I (HIGH SENSITIVITY)  TROPONIN I (HIGH SENSITIVITY)    EKG EKG Interpretation  Date/Time:  Saturday October 06 2022 11:22:40 EDT Ventricular Rate:  60 PR Interval:  231 QRS Duration: 149 QT Interval:  472 QTC Calculation: 472 R Axis:   86 Text Interpretation: Sinus rhythm Prolonged PR interval Right bundle branch block No significant change since last tracing Confirmed by Isla Pence [phone]) on 10/06/2022 11:49:21 AM  Radiology DG Chest 2 View  Result Date: 10/06/2022 CLINICAL DATA:  Chest pain EXAM: CHEST - 2 VIEW COMPARISON:  Previous studies including the examination of 08/30/2022 FINDINGS: Cardiac size is within normal limits. There are no signs of pulmonary edema or focal pulmonary consolidation. Small linear densities are seen in the left lower lung field. There is no pleural effusion or pneumothorax. Postsurgical changes are noted in both humeri. IMPRESSION: There are no signs of pulmonary edema or focal pulmonary consolidation. Small linear densities in left lower lung fields suggest minimal  scarring or minimal subsegmental atelectasis. Electronically Signed   By: Elmer Picker M.D.   On: 10/06/2022 12:17    Procedures Procedures  {Document cardiac monitor, telemetry assessment procedure when appropriate:1}  Medications Ordered in ED Medications - No data to display  ED Course/ Medical Decision Making/ A&P                           Medical Decision Making Patient here with right-sided chest pain that woke him from sleep at 3 AM this morning.  He took 1 sublingual nitroglycerin without significant relief.  When patient woke this morning he states pain had resolved.  Unclear as to how long the pain lasted.  Denies any symptoms at present.  On exam, patient well-appearing nontoxic.  Vital signs reassuring.  He describes pain of the right upper chest, I cannot reproduce pain with right arm movement or palpation.  No focal neurodeficits on exam.  Patient did undergo left heart catheterization and coronary angiography with placement of stents in August of this year.  Also had echocardiogram that showed EF of 55 to 60%.  Amount and/or Complexity of Data Reviewed Labs: ordered.    Details: Labs interpreted by me, no evidence of leukocytosis, hemoglobin reassuring.  Chemistries show serum creatinine of 1.28 this is slightly elevated from 1 month ago at 1.05.  Initial troponin 4 Radiology: ordered.    Details: Chest x-ray without signs of pulmonary edema or focal consolidation.  Possible minimal scarring or atelectasis. ECG/medicine tests: ordered.    Details: EKG shows sinus rhythm with prolonged PR and right bundle branch block. Discussion of management or test interpretation with external provider(s): Consulted cardiology, Dr. Herbert Spires who reviewed patient's medical records, felt that if second troponin is unchanged the patient can follow-up with his cardiologist in clinic.     {Document critical care time when appropriate:1} {Document review of labs and clinical decision  tools ie heart score, Chads2Vasc2 etc:1}  {Document your independent review of radiology images, and any outside records:1} {Document your discussion with family members, caretakers, and with consultants:1} {Document social determinants of health affecting pt's care:1} {Document your decision making why or why not admission, treatments were needed:1} Final Clinical Impression(s) / ED Diagnoses Final diagnoses:  None    Rx / DC Orders ED Discharge Orders     None

## 2022-10-06 NOTE — ED Notes (Signed)
Pt is awake and alert in NAD and denies chest pain or discomfort at this time.

## 2022-10-06 NOTE — Discharge Instructions (Signed)
Your work-up today was reassuring.  Please call your cardiologist to arrange a follow-up appointment.  Return to the emergency department for any new or worsening symptoms.

## 2022-10-06 NOTE — ED Triage Notes (Signed)
Pt reports Cp last night. No pain today. Pt reports pain worse when coughing.

## 2022-10-06 NOTE — ED Notes (Signed)
Pt transported to radiology.

## 2022-10-08 ENCOUNTER — Ambulatory Visit (HOSPITAL_COMMUNITY): Payer: Medicare HMO

## 2022-10-08 ENCOUNTER — Encounter (HOSPITAL_COMMUNITY): Payer: Medicare HMO

## 2022-10-10 ENCOUNTER — Ambulatory Visit (HOSPITAL_COMMUNITY): Payer: Medicare HMO

## 2022-10-10 ENCOUNTER — Encounter (HOSPITAL_COMMUNITY): Payer: Medicare HMO

## 2022-10-11 ENCOUNTER — Other Ambulatory Visit: Payer: Self-pay | Admitting: Internal Medicine

## 2022-10-12 ENCOUNTER — Ambulatory Visit (HOSPITAL_COMMUNITY): Payer: Medicare HMO

## 2022-10-12 ENCOUNTER — Encounter (HOSPITAL_COMMUNITY): Payer: Medicare HMO

## 2022-10-15 ENCOUNTER — Ambulatory Visit (HOSPITAL_COMMUNITY): Payer: Medicare HMO

## 2022-10-15 ENCOUNTER — Encounter (HOSPITAL_COMMUNITY): Payer: Medicare HMO

## 2022-10-15 DIAGNOSIS — Z789 Other specified health status: Secondary | ICD-10-CM | POA: Diagnosis not present

## 2022-10-15 DIAGNOSIS — L720 Epidermal cyst: Secondary | ICD-10-CM | POA: Diagnosis not present

## 2022-10-17 ENCOUNTER — Encounter (HOSPITAL_COMMUNITY): Payer: Medicare HMO

## 2022-10-17 ENCOUNTER — Ambulatory Visit (HOSPITAL_COMMUNITY): Payer: Medicare HMO

## 2022-10-22 ENCOUNTER — Encounter: Payer: Self-pay | Admitting: Cardiology

## 2022-10-22 ENCOUNTER — Other Ambulatory Visit: Payer: Self-pay | Admitting: Cardiology

## 2022-10-22 ENCOUNTER — Encounter: Payer: Medicare HMO | Attending: Cardiology | Admitting: Cardiology

## 2022-10-22 VITALS — BP 100/50 | HR 52 | Ht 72.0 in | Wt 190.0 lb

## 2022-10-22 DIAGNOSIS — E782 Mixed hyperlipidemia: Secondary | ICD-10-CM

## 2022-10-22 DIAGNOSIS — D6869 Other thrombophilia: Secondary | ICD-10-CM | POA: Diagnosis not present

## 2022-10-22 DIAGNOSIS — I2510 Atherosclerotic heart disease of native coronary artery without angina pectoris: Secondary | ICD-10-CM

## 2022-10-22 DIAGNOSIS — I4891 Unspecified atrial fibrillation: Secondary | ICD-10-CM | POA: Diagnosis not present

## 2022-10-22 MED ORDER — PRAVASTATIN SODIUM 20 MG PO TABS: 20.0000 mg | ORAL_TABLET | ORAL | 3 refills | Status: DC

## 2022-10-22 NOTE — Progress Notes (Signed)
Clinical Summary Sean Miranda is a 88 y.o.male seen today for follow up of the following medical problems.    1. Afib - appears diagnosed around 01/2017 during admission. Started on eliquis and lopressor 12.'5mg'$  bid. Due to continued dyspnea attempts were made to get him back into NSR, including multiple cardioversions and starting flecanide. Had tolerated lopressor at the time but stopped taking due to HRs in 40s or 50s, had been on 12.'5mg'$  bid at the time.    - was on flecanide previously, discontued after diagnosed with CAD  We tried lopressor 12.'5mg'$  bid last visit but caused fatigue and he stopped taking. Heart rates checked multiple times a day, usually 50s-60s.       - followed by afib clinic/EP - started on dofetilide 09/2020 with DCCV at that time - recurrent afib noted at 10/2020 f/u. Repeat DCCV was planned 11/2020 but cancelled as pt was back in SR. - at last EP appt 12/3 plan was to continue dofetilide - 07/2022 monitor no afib       2. CAD - admit 05/2017 with chest pain - 05/2017 cath as reported below, received a DES to LAD - 05/2017 echo LVEF 60-65%, no WMAs   - seen in ER 10/2019 with chest pain - trop neg x 1. EKG afib without acute ischemic changes. CXR no acute process    10/2019 nuclear stress no ischemia - no significant symptoms.     - Admit 07/2022 with unstable angina. 07/2022 cath: LM 25%, diatal LM/ostial LAD 70% borderline RFR, prox LAD 50%, ostial RCA 90%, mid RCA 50%, acute marginal 75%. DES to RCA   -as far as antianginals avoid beta blockers due to low HRs. On dofetilide would avoid ranexa. Soft bp's at time sbut would try starting back his norvasc 2.'5mg'$  daily. Uses sildenafil prn would avoid imdur.    08/2022 ER visit with chest pain, negative evaluation for ACS    10/06/22 visit with chest pain to ER - negative workup for ACS - pain developed in bed. Left sided pain pressure. 5/10 in severity. No other associated symptoms. Worst with deep  breathing. Pain last 6-8 hours constant - no recurrent symptoms - compliant with meds      3. Carotid stenosis - history of right CEA - 01/2018 carotid US RICA 1-39% and patent CEA, left 1-39%. Annual checks   - followed by vascular. Has appt next month with repeat US  01/9475 RICA 5-46%, LICA 5-03%   4. Hyperlipidemia - intolerant to statin, has been on low dose pravastatin.  - Dr Debara Pickett had discussed repatha, however patient declinded due to concern about too low cholesterol - 05/2018 TC 195 HDL 39 TG 73 LDL 141   - taking pravastatin '40mg'$  MWF - has been resistant to repatha.     10/2019 TC 243 HDL 44 LDL 179. He was off statin at that time but has since restarted - most recent labs with pcp    07/2022 RC 201 TG 59 HDL 38LDL 151  07/16/22 intolerant to pravastatin, started zetia.      Has had covid vaccine x3.      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 Son in Sports coach is [employer]. 2 sons and daughter are involved in family business       Past Medical History:  Diagnosis Date   Arthritis    neck  Atrial fibrillation ([place])    a. new-onset in 01/2017. Started on Eliquis   CAD (coronary artery disease), native coronary artery 05/16/2017   LAD 95%>>0 w/ 2.5 x 16 mm Synergy DES, D2 40%, RCA 50%, EF 55-65%, LVEDP mod elevated   Calcification of coronary artery    a. mild, nonobstructive CAD by cath in 2009.   Cancer of skin of chest    Carotid artery occlusion    a. s/p R CEA in 2014   Colon polyps    Hyperlipidemia    takes Pravastatin every other day   Pneumonia ~ 01/2013     Allergies  Allergen Reactions   Lipitor [Atorvastatin Calcium] Other (See Comments)    Muscle pain   Vantin [Cefpodoxime] Rash     Current Outpatient Medications  Medication Sig Dispense Refill   ALPHA LIPOIC ACID PO Take 250 mg by mouth daily.     amLODipine (NORVASC) 2.5 MG tablet Take  1 tablet (2.5 mg total) by mouth daily. (Patient not taking: Reported on 09/26/2022) 90 tablet 1   clopidogrel (PLAVIX) 75 MG tablet Take 1 tablet (75 mg total) by mouth daily with breakfast. 30 tablet 11   Coenzyme Q10 (COQ10) 400 MG CAPS Take 800 mg by mouth in the morning and at bedtime.      dofetilide (TIKOSYN) 250 MCG capsule TAKE (1) CAPSULE BY MOUTH TWICE DAILY. 60 capsule 0   ELIQUIS 5 MG TABS tablet TAKE (1) TABLET BY MOUTH TWICE DAILY. 60 tablet 6   ezetimibe (ZETIA) 10 MG tablet Take 1 tablet (10 mg total) by mouth daily. 90 tablet 3   Magnesium 500 MG TABS Take 500 mg by mouth daily.      memantine (NAMENDA) 5 MG tablet Take 5 mg by mouth daily.     Multiple Vitamin (MULTIVITAMIN WITH MINERALS) TABS tablet Take 1 tablet by mouth daily.     nitroGLYCERIN (NITROSTAT) 0.4 MG SL tablet Place 1 tablet (0.4 mg total) under the tongue every 5 (five) minutes as needed for chest pain. 25 tablet 1   sildenafil (REVATIO) 20 MG tablet Take 2-3 tablets (40-60 mg total) by mouth as needed (sexual activity). Do not take in combination with nitro SL.     zinc gluconate 50 MG tablet Take 50 mg by mouth daily.     No current facility-administered medications for this visit.     Past Surgical History:  Procedure Laterality Date   CARDIAC CATHETERIZATION  06/09/1999   mild CAD (LAD, diagonal, RCA) - Dr. Rockne Menghini   CARDIOVERSION N/A 03/25/2017   Procedure: CARDIOVERSION;  Surgeon: Pixie Casino, MD;  Location: [facility] ENDOSCOPY;  Service: Cardiovascular;  Laterality: N/A;   CARDIOVERSION N/A 09/28/2020   Procedure: CARDIOVERSION;  Surgeon: [name], MD;  Location: [facility];  Service: Cardiovascular;  Laterality: N/A;   CAROTID DOPPLER  02/2012   60-79% RICA stenosis, 27-78% LICA stenosis (prior to carotid endarterectomy)   COLONOSCOPY     CORONARY ATHERECTOMY N/A 07/09/2022   Procedure: CORONARY ATHERECTOMY;  Surgeon: Sherren Mocha, MD;  Location: [facility];  Service:  Cardiovascular;  Laterality: N/A;   CORONARY STENT INTERVENTION N/A 05/16/2017   Procedure: Coronary Stent Intervention;  Surgeon: Jettie Booze, MD;  Location: [facility];  Service: Cardiovascular;  Laterality: N/A;   CORONARY STENT INTERVENTION N/A 07/09/2022   Procedure: CORONARY STENT INTERVENTION;  Surgeon: Sherren Mocha, MD;  Location: [facility];  Service: Cardiovascular;  Laterality: N/A;   ENDARTERECTOMY Right  11/10/2013   Procedure: ENDARTERECTOMY CAROTID;  Surgeon: Conrad Viera West, MD;  Location: [facility];  Service: Vascular;  Laterality: Right;   INGUINAL HERNIA REPAIR Left    INTRAVASCULAR PRESSURE WIRE/FFR STUDY N/A 07/09/2022   Procedure: INTRAVASCULAR PRESSURE WIRE/FFR STUDY;  Surgeon: Sherren Mocha, MD;  Location: [facility];  Service: Cardiovascular;  Laterality: N/A;   INTRAVASCULAR ULTRASOUND/IVUS N/A 07/09/2022   Procedure: Intravascular Ultrasound/IVUS;  Surgeon: Sherren Mocha, MD;  Location: [facility];  Service: Cardiovascular;  Laterality: N/A;   LEFT HEART CATH AND CORONARY ANGIOGRAPHY N/A 05/16/2017   Procedure: Left Heart Cath and Coronary Angiography;  Surgeon: Jettie Booze, MD;  Location: [facility];  Service: Cardiovascular;  Laterality: N/A;   LEFT HEART CATH AND CORONARY ANGIOGRAPHY N/A 07/09/2022   Procedure: LEFT HEART CATH AND CORONARY ANGIOGRAPHY;  Surgeon: Sherren Mocha, MD;  Location: [facility];  Service: Cardiovascular;  Laterality: N/A;   MOLE REMOVAL  1990s   "chest"   NM MYOCAR PERF WALL MOTION  04/2011   bruce myoview - perfusion defect in inferior myocardium (diaphragmatic attenuation), remaining myocardium with normal perfusion, EF 67%   PATCH ANGIOPLASTY Right 11/10/2013   Procedure: PATCH ANGIOPLASTY;  Surgeon: Conrad Reidland, MD;  Location: [facility];  Service: Vascular;  Laterality: Right;   PILONIDAL CYST DRAINAGE     SHOULDER OPEN ROTATOR CUFF REPAIR Bilateral [place]   ~ 2015   "chest"   TEMPORARY PACEMAKER N/A 07/09/2022   Procedure: TEMPORARY PACEMAKER;  Surgeon: Sherren Mocha, MD;  Location: [facility];  Service: Cardiovascular;  Laterality: N/A;   TONSILLECTOMY       Allergies  Allergen Reactions   Lipitor [Atorvastatin Calcium] Other (See Comments)    Muscle pain   Vantin [Cefpodoxime] Rash      Family History  Problem Relation Age of Onset   Stroke Father    Hypertension Mother    Prostate cancer Son    Stroke Brother    Cancer Brother        spine     Social History Mr. Omdahl reports that he quit smoking about 49 years ago. His smoking use included cigarettes. He has a 18.00 pack-year smoking history. He has never used smokeless tobacco. Mr. Schiller reports current alcohol use of about 14.0 standard drinks of alcohol per week.   Review of Systems CONSTITUTIONAL: No weight loss, fever, chills, weakness or fatigue.  HEENT: Eyes: No visual loss, blurred vision, double vision or yellow sclerae.No hearing loss, sneezing, congestion, runny nose or sore throat.  SKIN: No rash or itching.  CARDIOVASCULAR: per hpi RESPIRATORY: No shortness of breath, cough or sputum.  GASTROINTESTINAL: No anorexia, nausea, vomiting or diarrhea. No abdominal pain or blood.  GENITOURINARY: No burning on urination, no polyuria NEUROLOGICAL: No headache, dizziness, syncope, paralysis, ataxia, numbness or tingling in the extremities. No change in bowel or bladder control.  MUSCULOSKELETAL: No muscle, back pain, joint pain or stiffness.  LYMPHATICS: No enlarged nodes. No history of splenectomy.  PSYCHIATRIC: No history of depression or anxiety.  ENDOCRINOLOGIC: No reports of sweating, cold or heat intolerance. No polyuria or polydipsia.  Marland Kitchen   Physical Examination Today's Vitals   10/22/22 0856  BP: (!) 100/50  Pulse: (!) 52  Weight: 190 lb (86.2 kg)  Height: 6' (1.829 m)   Body mass index is 25.77 kg/m.  Gen: resting comfortably, no  acute distress HEENT: no scleral icterus, pupils equal  round and reactive, no palptable cervical adenopathy,  CV: regular, brady 55, no m/r/g, no jvd Resp: Clear to auscultation bilaterally GI: abdomen is soft, non-tender, non-distended, normal bowel sounds, no hepatosplenomegaly MSK: extremities are warm, no edema.  Skin: warm, no rash Neuro:  no focal deficits Psych: appropriate affect   Diagnostic Studies  05/2017 cath   Mid RCA lesion, 50 %stenosed. Ost RCA lesion, 25 %stenosed. Lesion noted in small RV marginal. LM lesion, 25 %stenosed. 2nd Diag lesion, 40 %stenosed. Mid LAD-1 lesion, 20 %stenosed. Mid LAD-2 lesion, 95 %stenosed. A STENT SYNERGY DES 2.5X16 drug eluting stent was successfully placed, postdilated to 3.0 mm. Post intervention, there is a 0% residual stenosis. The left ventricular systolic function is normal. LV end diastolic pressure is moderately elevated. The left ventricular ejection fraction is 55-65% by visual estimate. There is no aortic valve stenosis.   LAD was the culprit for his unstable angina. He had symptoms during the procedure at rest. These resolved after stent placement. Watch overnight. Continue IV Angiomax for another hour.   Loaded with Plavix today. We'll start 75 mg daily tomorrow. Given aspirin today as well. Would plan for aspirin, Plavix and Eliquis for 30 days. After 30 days, would stop the aspirin and continue Plavix and Eliquis. A synergy drug-eluting stent was used since he is on long-term anticoagulation. If there is a bleeding complication, his antiplatelet therapy could be stopped sooner than usual. Ideally, he would get 6-12 months of Plavix depending on his bleeding risk profile.   Possible discharge tomorrow if he does well tonight and with cardiac rehabilitation tomorrow.     05/2017 echo Study Conclusions   - Left ventricle: The cavity size was normal. Wall thickness was   increased in a pattern of mild LVH. Systolic  function was normal.   The estimated ejection fraction was in the range of 60% to 65%.   Wall motion was normal; there were no regional wall motion   abnormalities. The study is not technically sufficient to allow   evaluation of LV diastolic function. - Aortic valve: Mildly calcified annulus. Trileaflet; moderately   thickened leaflets. Valve area (VTI): 2.98 cm^2. Valve area   (Vmax): 2.7 cm^2. Valve area (Vmean): 2.56 cm^2. - Left atrium: The atrium was mildly dilated. - Technically adequate study.     10/2019 nuclear stress Unable to achieve adequate heart rate response with GXT, Lexiscan utilized. No diagnostic ST segment changes. Small, mild intensity, inferior/inferoseptal defect extending from apex to base that is fixed (actually more prominent at rest) and consistent with soft tissue attenuation. No reversible defects to indicate ischemia. This is a low risk study. Nuclear stress EF: 69%.    07/2022 monitor 7 day monitor   Frequent isolated PACs. Rare couplets and triplets. Rare runs of SVT longest 7  beats   Rare ventricular ectopy in the form of isolated PVCs   Min HR 32, Max HR 80, avg HR 54. Sinus brady to low 30s at times in early and late afternoon. Nocturnal pauses up to 3.2 seconds.   No patient reported symptoms  Assessment and Plan   1. Afib/acquired thrombophilia - overall doing well on dofetilide, some evidence of conduction disease would avoid av nodal agents - continue eliquis for stroke prevention   2. CAD - recent stenting as reported above - recent noncardiac chest pain that has  resolved - continue current meds   3. Hyperlipidemia - limited in statin tolerance, wants to retry pravastatin '20mg'$  again. Continue zetia -  remains resistant to repatha or praluent   F/u 27mohts  JArnoldo Lenis M.D.

## 2022-10-22 NOTE — Patient Instructions (Signed)
Medication Instructions:  Your physician has recommended you make the following change in your medication:  Start Pravastatin 20 mg tablets on Monday, Wednesday, and Fridays.    Labwork: None  Testing/Procedures: None  Follow-Up: Follow up with Dr. Harl Bowie in 4 months.   Any Other Special Instructions Will Be Listed Below (If Applicable).     If you need a refill on your cardiac medications before your next appointment, please call your pharmacy.

## 2022-10-23 ENCOUNTER — Other Ambulatory Visit: Payer: Self-pay | Admitting: Cardiology

## 2022-10-28 ENCOUNTER — Emergency Department (HOSPITAL_COMMUNITY): Payer: Medicare HMO

## 2022-10-28 ENCOUNTER — Emergency Department (HOSPITAL_COMMUNITY)
Admission: EM | Admit: 2022-10-28 | Discharge: 2022-10-28 | Disposition: A | Discharge status: Home / Self Care | Payer: Medicare HMO | Attending: Emergency Medicine | Admitting: Emergency Medicine

## 2022-10-28 ENCOUNTER — Other Ambulatory Visit: Payer: Self-pay

## 2022-10-28 ENCOUNTER — Encounter (HOSPITAL_COMMUNITY): Payer: Self-pay | Admitting: *Deleted

## 2022-10-28 DIAGNOSIS — M436 Torticollis: Secondary | ICD-10-CM | POA: Insufficient documentation

## 2022-10-28 DIAGNOSIS — Z79899 Other long term (current) drug therapy: Secondary | ICD-10-CM | POA: Insufficient documentation

## 2022-10-28 DIAGNOSIS — M542 Cervicalgia: Secondary | ICD-10-CM | POA: Diagnosis not present

## 2022-10-28 DIAGNOSIS — Z7901 Long term (current) use of anticoagulants: Secondary | ICD-10-CM | POA: Insufficient documentation

## 2022-10-28 DIAGNOSIS — I2510 Atherosclerotic heart disease of native coronary artery without angina pectoris: Secondary | ICD-10-CM | POA: Diagnosis not present

## 2022-10-28 DIAGNOSIS — Z7902 Long term (current) use of antithrombotics/antiplatelets: Secondary | ICD-10-CM | POA: Insufficient documentation

## 2022-10-28 MED ORDER — METHOCARBAMOL 500 MG PO TABS
500.0000 mg | ORAL_TABLET | Freq: Once | ORAL | Status: AC
  Administered 2022-10-28: 500 mg via ORAL
  Filled 2022-10-28: qty 1

## 2022-10-28 MED ORDER — ACETAMINOPHEN 325 MG PO TABS
650.0000 mg | ORAL_TABLET | Freq: Once | ORAL | Status: AC
  Administered 2022-10-28: 650 mg via ORAL
  Filled 2022-10-28: qty 2

## 2022-10-28 MED ORDER — METHOCARBAMOL 500 MG PO TABS: 500.0000 mg | ORAL_TABLET | Freq: Two times a day (BID) | ORAL | 0 refills | Status: AC

## 2022-10-28 NOTE — Discharge Instructions (Signed)
Take the muscle relaxer medication in addition I recommend taking Tylenol 500 mg every 6 hours.  Apply heating pad to the left side of your neck for 20 to 30 minutes, after this heat treatment do you range of motion stretches as we discussed side-to-side holding each movement for 20 to 30 seconds with the intention of slowly improving this range of motion and reducing your pain with this range of motion.  If this is not helping within the next 5 to 7 days, call your doctor for recheck of your symptoms.  You may benefit from formal physical therapy if this is not improving your symptoms.  Use caution with the Robaxin, that this may make you sleepy, you should not drive within 6 hours of taking this medication.

## 2022-10-28 NOTE — ED Provider Notes (Signed)
[facility] EMERGENCY DEPARTMENT Provider Note   CSN: [id number] Arrival date & time: 10/28/22  1024     History  Chief Complaint  Patient presents with   Neck Pain    Sean Miranda is a 88 y.o. male with a history of hyperlipidemia, atrial fibrillation, CAD, history of carotid artery occlusion with surgical endarterectomy presenting for evaluation of pain and decreased range of motion along his left lateral neck which has been present for the past week.  His pain is worsened when he turns his head towards his left shoulder, lesser with rightward rotation but present with this motion as well.  He denies any injuries, wife at bedside endorses that he lifts weights in sometimes overdoes it and is suspicious this is the source of his symptoms.  He denies weakness or numbness in his arms or his legs.  He also denies chest pain, shortness of breath headache, fevers or chills.  He has taken Tylenol and has used an OTC muscle rub without significant improvement in his symptoms.  The history is provided by the patient and the spouse.       Home Medications Prior to Admission medications   Medication Sig Start Date End Date Taking? Authorizing Provider  methocarbamol (ROBAXIN) 500 MG tablet Take 1 tablet (500 mg total) by mouth 2 (two) times daily. 10/28/22  Yes Ryian Lynde, Almyra Free, PA-C  ALPHA LIPOIC ACID PO Take 250 mg by mouth daily.    [provider]  amLODipine (NORVASC) 2.5 MG tablet Take 1 tablet (2.5 mg total) by mouth daily. 08/30/22   Kommor, Debe Coder, MD  clopidogrel (PLAVIX) 75 MG tablet Take 1 tablet (75 mg total) by mouth daily with breakfast. 07/11/22   Duke, Tami Lin, PA  Coenzyme Q10 (COQ10) 400 MG CAPS Take 800 mg by mouth in the morning and at bedtime.     [provider]  dofetilide (TIKOSYN) 250 MCG capsule TAKE (1) CAPSULE BY MOUTH TWICE DAILY. 10/12/22   Evans Lance, MD  ELIQUIS 5 MG TABS tablet TAKE (1) TABLET BY MOUTH TWICE DAILY. 09/21/22   Evans Lance, MD  ezetimibe (ZETIA) 10 MG tablet Take 1 tablet (10 mg total) by mouth daily. 07/16/22   Elgie Collard, PA-C  Magnesium 500 MG TABS Take 500 mg by mouth daily.     [provider]  memantine (NAMENDA) 5 MG tablet Take 5 mg by mouth daily. 08/17/22   [provider]  Multiple Vitamin (MULTIVITAMIN WITH MINERALS) TABS tablet Take 1 tablet by mouth daily.    [provider]  nitroGLYCERIN (NITROSTAT) 0.4 MG SL tablet Place 1 tablet (0.4 mg total) under the tongue every 5 (five) minutes as needed for chest pain. 07/11/22   Arnoldo Lenis, MD  pravastatin (PRAVACHOL) 20 MG tablet Take 1 tablet (20 mg total) by mouth every Monday, Wednesday, and Friday. 10/22/22 07/29/23  Arnoldo Lenis, MD  sildenafil (REVATIO) 20 MG tablet Take 2-3 tablets (40-60 mg total) by mouth as needed (sexual activity). Do not take in combination with nitro SL. 07/10/22   Duke, Tami Lin, PA  zinc gluconate 50 MG tablet Take 50 mg by mouth daily.    [provider]      Allergies    Lipitor [atorvastatin calcium] and Vantin [cefpodoxime]    Review of Systems   Review of Systems  Constitutional:  Negative for chills and fever.  Musculoskeletal:  Positive for arthralgias, neck pain and neck stiffness. Negative for myalgias.  Neurological:  Negative for weakness, numbness and headaches.    Physical Exam Updated Vital Signs BP (!) 147/49   Pulse (!) 46   Temp 97.7 F (36.5 C) (Oral)   Resp 17   SpO2 98%  Physical Exam Constitutional:      Appearance: He is well-developed.  HENT:     Head: Normocephalic and atraumatic.  Neck:     Comments: Tender to palpation along the left trapezius muscle at the lateral neck.  There is no tenderness of his shoulder or scapula.  He does have reduced range of motion with leftward head rotation.  Also with right but less pain with rightward rotation.  Forward flexion and hyperextension are intact.  Patient has equal grip  strength. Cardiovascular:     Comments: Pulses equal bilaterally Musculoskeletal:        General: Tenderness present.     Cervical back: Tenderness present. No erythema, rigidity or torticollis. Muscular tenderness present. No spinous process tenderness. Decreased range of motion.  Lymphadenopathy:     Cervical: No cervical adenopathy.  Skin:    General: Skin is warm and dry.  Neurological:     Mental Status: He is alert.     Sensory: No sensory deficit.     Motor: No weakness.     Deep Tendon Reflexes: Reflexes normal.     ED Results / Procedures / Treatments   Labs (all labs ordered are listed, but only abnormal results are displayed) Labs Reviewed - No data to display  EKG None  Radiology No results found. Results for orders placed or performed during the hospital encounter of 10/06/22  CBC with Differential  Result Value Ref Range   WBC 5.5 4.0 - 10.5 K/uL   RBC 4.24 4.22 - 5.81 MIL/uL   Hemoglobin 13.7 13.0 - 17.0 g/dL   HCT 39.2 39.0 - 52.0 %   MCV 92.5 80.0 - 100.0 fL   MCH 32.3 26.0 - 34.0 pg   MCHC 34.9 30.0 - 36.0 g/dL   RDW 13.1 11.5 - 15.5 %   Platelets 233 150 - 400 K/uL   nRBC 0.0 0.0 - 0.2 %   Neutrophils Relative % 54 %   Neutro Abs 3.0 1.7 - 7.7 K/uL   Lymphocytes Relative 32 %   Lymphs Abs 1.7 0.7 - 4.0 K/uL   Monocytes Relative 9 %   Monocytes Absolute 0.5 0.1 - 1.0 K/uL   Eosinophils Relative 5 %   Eosinophils Absolute 0.3 0.0 - 0.5 K/uL   Basophils Relative 0 %   Basophils Absolute 0.0 0.0 - 0.1 K/uL   Immature Granulocytes 0 %   Abs Immature Granulocytes 0.01 0.00 - 0.07 K/uL  Basic metabolic panel  Result Value Ref Range   Sodium 138 135 - 145 mmol/L   Potassium 4.6 3.5 - 5.1 mmol/L   Chloride 105 98 - 111 mmol/L   CO2 28 22 - 32 mmol/L   Glucose, Bld 89 70 - 99 mg/dL   BUN 18 8 - 23 mg/dL   Creatinine, Ser 1.28 (H) 0.61 - 1.24 mg/dL   Calcium 9.0 8.9 - 10.3 mg/dL   GFR, Estimated 53 (L) >60 mL/min   Anion gap 5 5 - 15  Troponin  I (High Sensitivity)  Result Value Ref Range   Troponin I (High Sensitivity) 4 <18 ng/L  Troponin I (High Sensitivity)  Result Value Ref Range   Troponin I (High Sensitivity) 4 <18 ng/L   CT Cervical Spine Wo Contrast  Result  Date: 10/28/2022 CLINICAL DATA:  Neck trauma with left neck pain for a week while turning head. EXAM: CT CERVICAL SPINE WITHOUT CONTRAST TECHNIQUE: Multidetector CT imaging of the cervical spine was performed without intravenous contrast. Multiplanar CT image reconstructions were also generated. RADIATION DOSE REDUCTION: This exam was performed according to the departmental dose-optimization program which includes automated exposure control, adjustment of the mA and/or kV according to patient size and/or use of iterative reconstruction technique. COMPARISON:  None Available. FINDINGS: Alignment: Normal. Skull base and vertebrae: No acute fracture. No primary bone lesion or focal pathologic process. Soft tissues and spinal canal: No prevertebral fluid or swelling. No visible canal hematoma. Disc levels: Marked loss of disc height noted C4-5, C5-6, and C6-7 with endplate degeneration and spurring. There is advanced left-sided facet osteoarthritis on the left at C2-3. Advanced degenerative changes in the left C3-4 facets with fusion. Bilateral facet osteoarthritis noted at C7-T1. Marked foraminal encroachment is seen on the left at C3-4 secondary to uncinate spurring and facet overgrowth. Mild to moderate bilateral foraminal encroachment is seen at C4-5, C5-6, and C6-7 secondary to mainly to uncinate spurring. Upper chest: Biapical pleuroparenchymal scarring. Other: None. IMPRESSION: 1. No acute cervical spine fracture or traumatic subluxation. 2. Advanced degenerative changes of the cervical spine as above. Electronically Signed   By: Misty Stanley M.D.   On: 10/28/2022 14:16    Procedures Procedures    Medications Ordered in ED Medications  methocarbamol (ROBAXIN) tablet 500  mg (500 mg Oral Given 10/28/22 1322)  acetaminophen (TYLENOL) tablet 650 mg (650 mg Oral Given 10/28/22 1322)    ED Course/ Medical Decision Making/ A&P                           Medical Decision Making Patient presenting with left reproducible trapezius muscle tenderness with painful and reduced range of motion with leftward neck rotation.  This is consistent with muscle strain/torticollis.  He has no neuropathy in his upper extremities, equal grip strength is present.  Patient will be placed on Robaxin, caution regarding sedation.  He was also advised to apply heat therapy followed by gentle range of motion and stretching which was demonstrated.  Is also advised follow-up with his PCP for recheck if his symptoms are not improving over the next week with this treatment plan.  Amount and/or Complexity of Data Reviewed Radiology: ordered.    Details: CT C-spine is significant for advanced arthritis, no tumors no fractures.  Risk OTC drugs. Prescription drug management.           Final Clinical Impression(s) / ED Diagnoses Final diagnoses:  Torticollis, acute    Rx / DC Orders ED Discharge Orders          Ordered    methocarbamol (ROBAXIN) 500 MG tablet  2 times daily        10/28/22 1437              Landis Martins 10/30/22 2155    Godfrey Pick, MD 11/06/22 [phone]

## 2022-10-28 NOTE — ED Triage Notes (Addendum)
Pt with left neck pain for a week with turning his head.   Pt seen on 11/4 for CP.  Tried tylenol and OTC gel without relief. Denies any SOB.

## 2022-11-05 DIAGNOSIS — G309 Alzheimer's disease, unspecified: Secondary | ICD-10-CM | POA: Diagnosis not present

## 2022-11-05 DIAGNOSIS — I480 Paroxysmal atrial fibrillation: Secondary | ICD-10-CM | POA: Diagnosis not present

## 2022-11-05 DIAGNOSIS — I2510 Atherosclerotic heart disease of native coronary artery without angina pectoris: Secondary | ICD-10-CM | POA: Diagnosis not present

## 2022-11-05 DIAGNOSIS — M542 Cervicalgia: Secondary | ICD-10-CM | POA: Diagnosis not present

## 2022-11-12 ENCOUNTER — Other Ambulatory Visit: Payer: Self-pay | Admitting: Internal Medicine

## 2022-11-14 ENCOUNTER — Other Ambulatory Visit: Payer: Self-pay | Admitting: Internal Medicine

## 2022-12-15 ENCOUNTER — Other Ambulatory Visit: Payer: Self-pay | Admitting: Internal Medicine

## 2022-12-26 ENCOUNTER — Telehealth: Payer: Self-pay | Admitting: *Deleted

## 2022-12-26 ENCOUNTER — Other Ambulatory Visit: Payer: Self-pay | Admitting: Internal Medicine

## 2022-12-26 NOTE — Telephone Encounter (Signed)
   Patient Name: Sean Miranda  DOB: [birth year] MRN: [id number]  Primary Cardiologist: Carlyle Dolly, MD  Chart reviewed as part of pre-operative protocol coverage.   Simple dental extractions (i.e. 1-2 teeth) are considered low risk procedures per guidelines and generally do not require any specific cardiac clearance. It is also generally accepted that for simple extractions and dental cleanings, there is no need to interrupt blood thinner therapy.   SBE prophylaxis is not required for the patient from a cardiac standpoint.  I will route this recommendation to the requesting party via Epic fax function and remove from pre-op pool.  Please call with questions.  Lenna Sciara, NP 12/26/2022, 3:50 PM

## 2022-12-26 NOTE — Telephone Encounter (Signed)
   Pre-operative Risk Assessment    Patient Name: Sean Miranda  DOB: 20-Nov-1930 MRN: [id number]     Request for Surgical Clearance     Procedure:  Dental Extraction - Amount of Teeth to be Pulled:  1 TOOTH FOR EXTRACTION  Date of Surgery:  Clearance TBD                                 Surgeon:  DR. REHM Surgeon's Group or Practice Name:  [facility] [place] Phone number:  [phone] Fax number:  [phone]   Type of Clearance Requested:   - Medical  - Pharmacy:  Hold Apixaban (Eliquis)     Type of Anesthesia:  Not Indicated (LOCAL?)   Additional requests/questions:    Jiles Prows   12/26/2022, 2:51 PM

## 2023-01-28 ENCOUNTER — Telehealth: Payer: Self-pay | Admitting: Cardiology

## 2023-01-28 NOTE — Telephone Encounter (Addendum)
Pt states that this morning when he took his BP it was lower than normal and his HR was elevated. Pt states he only gets SOB when he walks up the hill at home. Pt reports that this has been going on for 6 months. Pt denies CP at this time. States that BP and Hr were normal on yesterday has not been recording BP and Hr but will start. Pt can be reached at [phone]. Please advise.

## 2023-01-28 NOTE — Telephone Encounter (Signed)
Can he come in for a vitals and EKG check. Depending on heart rhythm may need to get back into see EP   Zandra Abts MD

## 2023-01-28 NOTE — Telephone Encounter (Signed)
Pt came in office and stated his heart was acting up this morning. He says his heart rate is high and blood pressure low. Heart rate is usually hight 40s low 50s this morning it was low 90s and not changing. Blood pressure is noramlly 120+-10/50s/60s this morning it was 94/60s

## 2023-01-28 NOTE — Telephone Encounter (Signed)
Returned call to pt. Pt states that he can come Wed. For nurse visit. Appt. Made.

## 2023-01-30 ENCOUNTER — Ambulatory Visit: Payer: Medicare HMO | Attending: Internal Medicine | Admitting: *Deleted

## 2023-01-30 VITALS — BP 150/58 | HR 52 | Ht 72.0 in | Wt 194.6 lb

## 2023-01-30 DIAGNOSIS — I4811 Longstanding persistent atrial fibrillation: Secondary | ICD-10-CM

## 2023-01-30 NOTE — Progress Notes (Signed)
BP's mildly elevated but if recently had low bp's at home would not make changes. HR slightly low but rhythm is normal. No changes at this time   Sean Abts MD

## 2023-01-30 NOTE — Progress Notes (Signed)
Pt in office for BP check and EKG. He states that he feels "Wonderful".

## 2023-01-31 ENCOUNTER — Telehealth: Payer: Self-pay | Admitting: *Deleted

## 2023-01-31 NOTE — Telephone Encounter (Signed)
  BP's mildly elevated but if recently had low bp's at home would not make changes. HR slightly low but rhythm is normal. No changes at this time     Zandra Abts MD   Pt notified and thankful for the call.

## 2023-01-31 NOTE — Telephone Encounter (Signed)
-----   Message from Arnoldo Lenis, MD sent at 01/30/2023  5:05 PM EST -----    ----- Message ----- From: Levonne Hubert, LPN Sent: [id number]   4:56 PM EST To: Arnoldo Lenis, MD

## 2023-02-18 DIAGNOSIS — Z0184 Encounter for antibody response examination: Secondary | ICD-10-CM | POA: Diagnosis not present

## 2023-02-28 DIAGNOSIS — Z85831 Personal history of malignant neoplasm of soft tissue: Secondary | ICD-10-CM | POA: Diagnosis not present

## 2023-02-28 DIAGNOSIS — Z08 Encounter for follow-up examination after completed treatment for malignant neoplasm: Secondary | ICD-10-CM | POA: Diagnosis not present

## 2023-02-28 DIAGNOSIS — C493 Malignant neoplasm of connective and soft tissue of thorax: Secondary | ICD-10-CM | POA: Diagnosis not present

## 2023-02-28 DIAGNOSIS — C499 Malignant neoplasm of connective and soft tissue, unspecified: Secondary | ICD-10-CM | POA: Diagnosis not present

## 2023-03-01 ENCOUNTER — Ambulatory Visit: Payer: Medicare HMO | Admitting: Cardiology

## 2023-03-01 NOTE — Progress Notes (Deleted)
Clinical Summary Mr. Bratz is a 89 y.o.male  seen today for follow up of the following medical problems.    1. Afib - appears diagnosed around 01/2017 during admission. Started on eliquis and lopressor 12.5mg  bid. Due to continued dyspnea attempts were made to get him back into NSR, including multiple cardioversions and starting flecanide. Had tolerated lopressor at the time but stopped taking due to HRs in 40s or 50s, had been on 12.5mg  bid at the time.    - was on flecanide previously, discontued after diagnosed with CAD  We tried lopressor 12.5mg  bid last visit but caused fatigue and he stopped taking. Heart rates checked multiple times a day, usually 50s-60s.        - followed by afib clinic/EP - started on dofetilide 09/2020 with DCCV at that time - recurrent afib noted at 10/2020 f/u. Repeat DCCV was planned 11/2020 but cancelled as pt was back in SR. - at last EP appt 12/3 plan was to continue dofetilide - 07/2022 monitor no afib       2. CAD - admit 05/2017 with chest pain - 05/2017 cath as reported below, received a DES to LAD - 05/2017 echo LVEF 60-65%, no WMAs   - seen in ER 10/2019 with chest pain - trop neg x 1. EKG afib without acute ischemic changes. CXR no acute process    10/2019 nuclear stress no ischemia - no significant symptoms.      - Admit 07/2022 with unstable angina. 07/2022 cath: LM 25%, diatal LM/ostial LAD 70% borderline RFR, prox LAD 50%, ostial RCA 90%, mid RCA 50%, acute marginal 75%. DES to RCA    -as far as antianginals avoid beta blockers due to low HRs. On dofetilide would avoid ranexa. Soft bp's at times but would try starting back his norvasc 2.5mg  daily. Uses sildenafil prn would avoid imdur.     08/2022 ER visit with chest pain, negative evaluation for ACS       10/06/22 visit with chest pain to ER - negative workup for ACS - pain developed in bed. Left sided pain pressure. 5/10 in severity. No other associated symptoms. Worst with  deep breathing. Pain last 6-8 hours constant - no recurrent symptoms - compliant with meds       3. Carotid stenosis - history of right CEA - 01/2018 carotid US RICA 1-39% and patent CEA, left 1-39%. Annual checks   - followed by vascular. Has appt next month with repeat US   0000000 RICA 123456, LICA [id number]   4. Hyperlipidemia - intolerant to statin, has been on low dose pravastatin.  - Dr Debara Pickett had discussed repatha, however patient declinded due to concern about too low cholesterol - 05/2018 TC 195 HDL 39 TG 73 LDL 141   - taking pravastatin 40mg  MWF - has been resistant to repatha.     10/2019 TC 243 HDL 44 LDL 179. He was off statin at that time but has since restarted - most recent labs with pcp    07/2022 RC 201 TG 59 HDL 38LDL 151   07/16/22 intolerant to pravastatin, started zetia.          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 Son in Sports coach is [employer]. 2 sons and daughter are involved in family business Past Medical History:  Diagnosis Date   Arthritis    neck  Atrial fibrillation ([place])    a. new-onset in 01/2017. Started on Eliquis   CAD (coronary artery disease), native coronary artery 05/16/2017   LAD 95%>>0 w/ 2.5 x 16 mm Synergy DES, D2 40%, RCA 50%, EF 55-65%, LVEDP mod elevated   Calcification of coronary artery    a. mild, nonobstructive CAD by cath in 2009.   Cancer of skin of chest    Carotid artery occlusion    a. s/p R CEA in 2014   Colon polyps    Hyperlipidemia    takes Pravastatin every other day   Pneumonia ~ 01/2013     Allergies  Allergen Reactions   Lipitor [Atorvastatin Calcium] Other (See Comments)    Muscle pain   Vantin [Cefpodoxime] Rash     Current Outpatient Medications  Medication Sig Dispense Refill   ALPHA LIPOIC ACID PO Take 250 mg by mouth daily.     amLODipine (NORVASC) 2.5 MG tablet Take 1 tablet (2.5 mg total) by  mouth daily. 90 tablet 1   clopidogrel (PLAVIX) 75 MG tablet Take 1 tablet (75 mg total) by mouth daily with breakfast. 30 tablet 11   Coenzyme Q10 (COQ10) 400 MG CAPS Take 800 mg by mouth in the morning and at bedtime.      dofetilide (TIKOSYN) 250 MCG capsule Take 1 capsule (250 mcg total) by mouth 2 (two) times daily. 180 capsule 3   ELIQUIS 5 MG TABS tablet TAKE (1) TABLET BY MOUTH TWICE DAILY. 60 tablet 6   ezetimibe (ZETIA) 10 MG tablet Take 1 tablet (10 mg total) by mouth daily. 90 tablet 3   Magnesium 500 MG TABS Take 500 mg by mouth daily.      memantine (NAMENDA) 5 MG tablet Take 5 mg by mouth daily.     methocarbamol (ROBAXIN) 500 MG tablet Take 1 tablet (500 mg total) by mouth 2 (two) times daily. 20 tablet 0   Multiple Vitamin (MULTIVITAMIN WITH MINERALS) TABS tablet Take 1 tablet by mouth daily.     nitroGLYCERIN (NITROSTAT) 0.4 MG SL tablet Place 1 tablet (0.4 mg total) under the tongue every 5 (five) minutes as needed for chest pain. 25 tablet 1   pravastatin (PRAVACHOL) 20 MG tablet Take 1 tablet (20 mg total) by mouth every Monday, Wednesday, and Friday. 30 tablet 3   sildenafil (REVATIO) 20 MG tablet Take 2-3 tablets (40-60 mg total) by mouth as needed (sexual activity). Do not take in combination with nitro SL.     zinc gluconate 50 MG tablet Take 50 mg by mouth daily.     No current facility-administered medications for this visit.     Past Surgical History:  Procedure Laterality Date   CARDIAC CATHETERIZATION  06/09/1999   mild CAD (LAD, diagonal, RCA) - Dr. Rockne Menghini   CARDIOVERSION N/A 03/25/2017   Procedure: CARDIOVERSION;  Surgeon: Pixie Casino, MD;  Location: [facility] ENDOSCOPY;  Service: Cardiovascular;  Laterality: N/A;   CARDIOVERSION N/A 09/28/2020   Procedure: CARDIOVERSION;  Surgeon: [name], MD;  Location: [place];  Service: Cardiovascular;  Laterality: N/A;   CAROTID DOPPLER  02/2012   60-79% RICA stenosis, 123456 LICA stenosis (prior to carotid  endarterectomy)   COLONOSCOPY     CORONARY ATHERECTOMY N/A 07/09/2022   Procedure: CORONARY ATHERECTOMY;  Surgeon: Sherren Mocha, MD;  Location: [place] [facility];  Service: Cardiovascular;  Laterality: N/A;   CORONARY STENT INTERVENTION N/A 05/16/2017   Procedure: Coronary Stent Intervention;  Surgeon: Jettie Booze, MD;  Location:  [place] [facility];  Service: Cardiovascular;  Laterality: N/A;   CORONARY STENT INTERVENTION N/A 07/09/2022   Procedure: CORONARY STENT INTERVENTION;  Surgeon: Sherren Mocha, MD;  Location: [place] [facility];  Service: Cardiovascular;  Laterality: N/A;   ENDARTERECTOMY Right 11/10/2013   Procedure: ENDARTERECTOMY CAROTID;  Surgeon: Conrad Camanche [place], MD;  Location: [place];  Service: Vascular;  Laterality: Right;   INGUINAL HERNIA REPAIR Left    INTRAVASCULAR PRESSURE WIRE/FFR STUDY N/A 07/09/2022   Procedure: INTRAVASCULAR PRESSURE WIRE/FFR STUDY;  Surgeon: Sherren Mocha, MD;  Location: [place] [facility];  Service: Cardiovascular;  Laterality: N/A;   INTRAVASCULAR ULTRASOUND/IVUS N/A 07/09/2022   Procedure: Intravascular Ultrasound/IVUS;  Surgeon: Sherren Mocha, MD;  Location: [place] [facility];  Service: Cardiovascular;  Laterality: N/A;   LEFT HEART CATH AND CORONARY ANGIOGRAPHY N/A 05/16/2017   Procedure: Left Heart Cath and Coronary Angiography;  Surgeon: Jettie Booze, MD;  Location: [place] [facility];  Service: Cardiovascular;  Laterality: N/A;   LEFT HEART CATH AND CORONARY ANGIOGRAPHY N/A 07/09/2022   Procedure: LEFT HEART CATH AND CORONARY ANGIOGRAPHY;  Surgeon: Sherren Mocha, MD;  Location: [place] [facility];  Service: Cardiovascular;  Laterality: N/A;   MOLE REMOVAL  1990s   "chest"   NM MYOCAR PERF WALL MOTION  04/2011   bruce myoview - perfusion defect in inferior myocardium (diaphragmatic attenuation), remaining myocardium with normal perfusion, EF 67%   PATCH ANGIOPLASTY Right 11/10/2013   Procedure: PATCH ANGIOPLASTY;  Surgeon: Conrad Pepin, MD;  Location: [place];  Service: Vascular;  Laterality: Right;   PILONIDAL CYST DRAINAGE     SHOULDER OPEN ROTATOR CUFF REPAIR Bilateral [place]  ~ 2015   "chest"   TEMPORARY PACEMAKER N/A 07/09/2022   Procedure: TEMPORARY PACEMAKER;  Surgeon: Sherren Mocha, MD;  Location: [place] [facility];  Service: Cardiovascular;  Laterality: N/A;   TONSILLECTOMY       Allergies  Allergen Reactions   Lipitor [Atorvastatin Calcium] Other (See Comments)    Muscle pain   Vantin [Cefpodoxime] Rash      Family History  Problem Relation Age of Onset   Stroke Father    Hypertension Mother    Prostate cancer Son    Stroke Brother    Cancer Brother        spine     Social History Mr. Martinie reports that he quit smoking about 50 years ago. His smoking use included cigarettes. He has a 18.00 pack-year smoking history. He has never used smokeless tobacco. Mr. Kowitz reports current alcohol use of about 14.0 standard drinks of alcohol per week.   Review of Systems CONSTITUTIONAL: No weight loss, fever, chills, weakness or fatigue.  HEENT: Eyes: No visual loss, blurred vision, double vision or yellow sclerae.No hearing loss, sneezing, congestion, runny nose or sore throat.  SKIN: No rash or itching.  CARDIOVASCULAR:  RESPIRATORY: No shortness of breath, cough or sputum.  GASTROINTESTINAL: No anorexia, nausea, vomiting or diarrhea. No abdominal pain or blood.  GENITOURINARY: No burning on urination, no polyuria NEUROLOGICAL: No headache, dizziness, syncope, paralysis, ataxia, numbness or tingling in the extremities. No change in bowel or bladder control.  MUSCULOSKELETAL: No muscle, back pain, joint pain or stiffness.  LYMPHATICS: No enlarged nodes. No history of splenectomy.  PSYCHIATRIC: No history of depression or anxiety.  ENDOCRINOLOGIC: No reports of sweating, cold or heat intolerance. No polyuria or polydipsia.  Marland Kitchen   Physical Examination There were no  vitals filed for this visit. There were no vitals filed for this visit.  Gen: resting comfortably, no acute distress HEENT: no scleral icterus, pupils equal round and reactive, no palptable cervical adenopathy,  CV Resp: Clear to auscultation bilaterally GI: abdomen is soft, non-tender, non-distended, normal bowel sounds, no hepatosplenomegaly MSK: extremities are warm, no edema.  Skin: warm, no rash Neuro:  no focal deficits Psych: appropriate affect   Diagnostic Studies  05/2017 cath   Mid RCA lesion, 50 %stenosed. Ost RCA lesion, 25 %stenosed. Lesion noted in small RV marginal. LM lesion, 25 %stenosed. 2nd Diag lesion, 40 %stenosed. Mid LAD-1 lesion, 20 %stenosed. Mid LAD-2 lesion, 95 %stenosed. A STENT SYNERGY DES 2.5X16 drug eluting stent was successfully placed, postdilated to 3.0 mm. Post intervention, there is a 0% residual stenosis. The left ventricular systolic function is normal. LV end diastolic pressure is moderately elevated. The left ventricular ejection fraction is 55-65% by visual estimate. There is no aortic valve stenosis.   LAD was the culprit for his unstable angina. He had symptoms during the procedure at rest. These resolved after stent placement. Watch overnight. Continue IV Angiomax for another hour.   Loaded with Plavix today. We'll start 75 mg daily tomorrow. Given aspirin today as well. Would plan for aspirin, Plavix and Eliquis for 30 days. After 30 days, would stop the aspirin and continue Plavix and Eliquis. A synergy drug-eluting stent was used since he is on long-term anticoagulation. If there is a bleeding complication, his antiplatelet therapy could be stopped sooner than usual. Ideally, he would get 6-12 months of Plavix depending on his bleeding risk profile.   Possible discharge tomorrow if he does well tonight and with cardiac rehabilitation tomorrow.     05/2017 echo Study Conclusions   - Left ventricle: The cavity size was normal. Wall  thickness was   increased in a pattern of mild LVH. Systolic function was normal.   The estimated ejection fraction was in the range of 60% to 65%.   Wall motion was normal; there were no regional wall motion   abnormalities. The study is not technically sufficient to allow   evaluation of LV diastolic function. - Aortic valve: Mildly calcified annulus. Trileaflet; moderately   thickened leaflets. Valve area (VTI): 2.98 cm^2. Valve area   (Vmax): 2.7 cm^2. Valve area (Vmean): 2.56 cm^2. - Left atrium: The atrium was mildly dilated. - Technically adequate study.     10/2019 nuclear stress Unable to achieve adequate heart rate response with GXT, Lexiscan utilized. No diagnostic ST segment changes. Small, mild intensity, inferior/inferoseptal defect extending from apex to base that is fixed (actually more prominent at rest) and consistent with soft tissue attenuation. No reversible defects to indicate ischemia. This is a low risk study. Nuclear stress EF: 69%.     07/2022 monitor 7 day monitor   Frequent isolated PACs. Rare couplets and triplets. Rare runs of SVT longest 7  beats   Rare ventricular ectopy in the form of isolated PVCs   Min HR 32, Max HR 80, avg HR 54. Sinus brady to low 30s at times in early and late afternoon. Nocturnal pauses up to 3.2 seconds.   No patient reported symptoms   Assessment and Plan   1. Afib/acquired thrombophilia - overall doing well on dofetilide, some evidence of conduction disease would avoid av nodal agents - continue eliquis for stroke prevention   2. CAD - recent stenting as reported above - recent noncardiac chest pain that has  resolved -  continue current meds   3. Hyperlipidemia - limited in statin tolerance, wants to retry pravastatin 20mg  again. Continue zetia - remains resistant to repatha or praluent     Arnoldo Lenis, M.D., F.A.C.C.

## 2023-03-13 ENCOUNTER — Emergency Department (HOSPITAL_COMMUNITY): Payer: Medicare HMO

## 2023-03-13 ENCOUNTER — Encounter (HOSPITAL_COMMUNITY): Payer: Self-pay | Admitting: Emergency Medicine

## 2023-03-13 ENCOUNTER — Other Ambulatory Visit: Payer: Self-pay

## 2023-03-13 ENCOUNTER — Emergency Department (HOSPITAL_COMMUNITY)
Admission: EM | Admit: 2023-03-13 | Discharge: 2023-03-13 | Disposition: A | Discharge status: Home / Self Care | Payer: Medicare HMO | Attending: Emergency Medicine | Admitting: Emergency Medicine

## 2023-03-13 DIAGNOSIS — S0990XA Unspecified injury of head, initial encounter: Secondary | ICD-10-CM | POA: Diagnosis not present

## 2023-03-13 DIAGNOSIS — R519 Headache, unspecified: Secondary | ICD-10-CM | POA: Diagnosis not present

## 2023-03-13 DIAGNOSIS — I10 Essential (primary) hypertension: Secondary | ICD-10-CM | POA: Diagnosis not present

## 2023-03-13 DIAGNOSIS — I2510 Atherosclerotic heart disease of native coronary artery without angina pectoris: Secondary | ICD-10-CM | POA: Diagnosis not present

## 2023-03-13 DIAGNOSIS — Z7901 Long term (current) use of anticoagulants: Secondary | ICD-10-CM | POA: Insufficient documentation

## 2023-03-13 DIAGNOSIS — Z79899 Other long term (current) drug therapy: Secondary | ICD-10-CM | POA: Diagnosis not present

## 2023-03-13 DIAGNOSIS — W19XXXA Unspecified fall, initial encounter: Secondary | ICD-10-CM

## 2023-03-13 DIAGNOSIS — S0101XA Laceration without foreign body of scalp, initial encounter: Secondary | ICD-10-CM | POA: Diagnosis not present

## 2023-03-13 DIAGNOSIS — Y9259 Other trade areas as the place of occurrence of the external cause: Secondary | ICD-10-CM | POA: Diagnosis not present

## 2023-03-13 DIAGNOSIS — Z7902 Long term (current) use of antithrombotics/antiplatelets: Secondary | ICD-10-CM | POA: Diagnosis not present

## 2023-03-13 DIAGNOSIS — M542 Cervicalgia: Secondary | ICD-10-CM | POA: Diagnosis not present

## 2023-03-13 DIAGNOSIS — W01198A Fall on same level from slipping, tripping and stumbling with subsequent striking against other object, initial encounter: Secondary | ICD-10-CM | POA: Insufficient documentation

## 2023-03-13 DIAGNOSIS — S0003XA Contusion of scalp, initial encounter: Secondary | ICD-10-CM | POA: Diagnosis not present

## 2023-03-13 DIAGNOSIS — M25511 Pain in right shoulder: Secondary | ICD-10-CM | POA: Diagnosis not present

## 2023-03-13 NOTE — ED Triage Notes (Signed)
Pt fell in garage and hit back of head. Pt is on blood thinners. Laceration to back of head and right shoulder pain. Pt is alert and oriented. Pt does not know what caused the fall and doesn't remember falling. Pt denies nausea, dizziness and lightheadedness.

## 2023-03-13 NOTE — ED Notes (Signed)
Patient transported to CT 

## 2023-03-13 NOTE — Discharge Instructions (Signed)
Clean laceration twice a day with soap and water.  Get your family doctor to remove the staples in 7 to 10 days.  You can also go to an urgent care.  Take Tylenol for pain.  Return if any problems

## 2023-03-13 NOTE — ED Provider Notes (Signed)
[place] EMERGENCY DEPARTMENT AT [facility] Provider Note   CSN: [id number] Arrival date & time: 03/13/23  1825     History  Chief Complaint  Patient presents with  . Fall    Sean Miranda is a 89 y.o. male.  Patient states he tripped on some steps and hit his head.  No loss of consciousness..  Patient has a history of coronary artery disease  The history is provided by the patient and medical records. No language interpreter was used.  Fall This is a new problem. The current episode started less than 1 hour ago. The problem occurs rarely. The problem has been resolved. Associated symptoms include headaches. Pertinent negatives include no chest pain and no abdominal pain. Nothing aggravates the symptoms. Nothing relieves the symptoms. He has tried nothing for the symptoms.       Home Medications Prior to Admission medications   Medication Sig Start Date End Date Taking? Authorizing Provider  ALPHA LIPOIC ACID PO Take 250 mg by mouth daily.    [provider]  amLODipine (NORVASC) 2.5 MG tablet Take 1 tablet (2.5 mg total) by mouth daily. 08/30/22   Kommor, Wyn Forster, MD  clopidogrel (PLAVIX) 75 MG tablet Take 1 tablet (75 mg total) by mouth daily with breakfast. 07/11/22   Duke, Roe Rutherford, PA  Coenzyme Q10 (COQ10) 400 MG CAPS Take 800 mg by mouth in the morning and at bedtime.     [provider]  dofetilide (TIKOSYN) 250 MCG capsule Take 1 capsule (250 mcg total) by mouth 2 (two) times daily. 12/17/22   Marinus Maw, MD  ELIQUIS 5 MG TABS tablet TAKE (1) TABLET BY MOUTH TWICE DAILY. 09/21/22   Marinus Maw, MD  ezetimibe (ZETIA) 10 MG tablet Take 1 tablet (10 mg total) by mouth daily. 07/16/22   Sharlene Dory, PA-C  Magnesium 500 MG TABS Take 500 mg by mouth daily.     [provider]  memantine (NAMENDA) 5 MG tablet Take 5 mg by mouth daily. 08/17/22   [provider]  methocarbamol (ROBAXIN) 500 MG tablet Take 1 tablet  (500 mg total) by mouth 2 (two) times daily. 10/28/22   Burgess Amor, PA-C  Multiple Vitamin (MULTIVITAMIN WITH MINERALS) TABS tablet Take 1 tablet by mouth daily.    [provider]  nitroGLYCERIN (NITROSTAT) 0.4 MG SL tablet Place 1 tablet (0.4 mg total) under the tongue every 5 (five) minutes as needed for chest pain. 07/11/22   Antoine Poche, MD  pravastatin (PRAVACHOL) 20 MG tablet Take 1 tablet (20 mg total) by mouth every Monday, Wednesday, and Friday. 10/22/22 07/29/23  Antoine Poche, MD  sildenafil (REVATIO) 20 MG tablet Take 2-3 tablets (40-60 mg total) by mouth as needed (sexual activity). Do not take in combination with nitro SL. 07/10/22   Duke, Roe Rutherford, PA  zinc gluconate 50 MG tablet Take 50 mg by mouth daily.    [provider]      Allergies    Lipitor [atorvastatin calcium] and Vantin [cefpodoxime]    Review of Systems   Review of Systems  Constitutional:  Negative for appetite change and fatigue.  HENT:  Negative for congestion, ear discharge and sinus pressure.   Eyes:  Negative for discharge.  Respiratory:  Negative for cough.   Cardiovascular:  Negative for chest pain.  Gastrointestinal:  Negative for abdominal pain and diarrhea.  Genitourinary:  Negative for frequency and hematuria.  Musculoskeletal:  Negative for back  pain.       Right shoulder pain  Skin:  Negative for rash.  Neurological:  Positive for headaches. Negative for seizures.  Psychiatric/Behavioral:  Negative for hallucinations.     Physical Exam Updated Vital Signs BP (!) 190/70   Pulse 70   Temp 97.8 F (36.6 C) (Oral)   Resp (!) 21   Wt 81.6 kg   SpO2 97%   BMI 24.41 kg/m  Physical Exam Vitals and nursing note reviewed.  Constitutional:      Appearance: He is well-developed.  HENT:     Head: Normocephalic.     Comments: 3 centimeter laceration to his occipital area    Nose: Nose normal.  Eyes:     General: No scleral icterus.    Conjunctiva/sclera:  Conjunctivae normal.  Neck:     Thyroid: No thyromegaly.  Cardiovascular:     Rate and Rhythm: Normal rate and regular rhythm.     Heart sounds: No murmur heard.    No friction rub. No gallop.  Pulmonary:     Breath sounds: No stridor. No wheezing or rales.  Chest:     Chest wall: No tenderness.  Abdominal:     General: There is no distension.     Tenderness: There is no abdominal tenderness. There is no rebound.  Musculoskeletal:        General: Normal range of motion.     Cervical back: Neck supple.     Comments: Tender right shoulder  Lymphadenopathy:     Cervical: No cervical adenopathy.  Skin:    Findings: No erythema or rash.  Neurological:     Mental Status: He is alert and oriented to person, place, and time.     Motor: No abnormal muscle tone.     Coordination: Coordination normal.  Psychiatric:        Behavior: Behavior normal.    ED Results / Procedures / Treatments   Labs (all labs ordered are listed, but only abnormal results are displayed) Labs Reviewed - No data to display  EKG None  Radiology DG Shoulder Right  Result Date: 03/13/2023 CLINICAL DATA:  Right shoulder pain following a fall. EXAM: RIGHT SHOULDER - 2+ VIEW COMPARISON:  None Available. FINDINGS: Right humeral head fixation anchors. Mild glenohumeral spur formation. Greater tuberosity hyperostosis. Probable surgical absence of the distal aspect of the right clavicle. No fracture or dislocation seen. IMPRESSION: 1. No fracture or dislocation. 2. Mild glenohumeral degenerative changes. Electronically Signed   By: Beckie SaltsSteven  Reid M.D.   On: 03/13/2023 19:13   CT Head Wo Contrast  Result Date: 03/13/2023 CLINICAL DATA:  Recent fall with headaches and neck pain, initial encounter EXAM: CT HEAD WITHOUT CONTRAST CT CERVICAL SPINE WITHOUT CONTRAST TECHNIQUE: Multidetector CT imaging of the head and cervical spine was performed following the standard protocol without intravenous contrast. Multiplanar CT  image reconstructions of the cervical spine were also generated. RADIATION DOSE REDUCTION: This exam was performed according to the departmental dose-optimization program which includes automated exposure control, adjustment of the mA and/or kV according to patient size and/or use of iterative reconstruction technique. COMPARISON:  None Available. FINDINGS: CT HEAD FINDINGS Brain: No evidence of acute infarction, hemorrhage, hydrocephalus, extra-axial collection or mass lesion/mass effect. Vascular: No hyperdense vessel or unexpected calcification. Skull: Normal. Negative for fracture or focal lesion. Sinuses/Orbits: No acute finding. Other: Scalp hematoma is noted near the vertex posteriorly just to the right of the midline. CT CERVICAL SPINE FINDINGS Alignment: Mild straightening of the normal  cervical lordosis. Skull base and vertebrae: 7 cervical segments are well visualized. Vertebral body height is well maintained. Osteophytic changes and facet hypertrophic changes are noted. No acute fracture or acute facet abnormality is noted. The odontoid is within normal limits. Soft tissues and spinal canal: Surrounding soft tissue structures are within normal limits. Upper chest: Visualized lung apices are within normal limits. Other: None IMPRESSION: CT of the head: Scalp hematoma as described. No acute intracranial abnormality is noted. CT of the cervical spine: Multilevel degenerative changes without acute abnormality. Electronically Signed   By: Alcide Clever M.D.   On: 03/13/2023 19:10   CT Cervical Spine Wo Contrast  Result Date: 03/13/2023 CLINICAL DATA:  Recent fall with headaches and neck pain, initial encounter EXAM: CT HEAD WITHOUT CONTRAST CT CERVICAL SPINE WITHOUT CONTRAST TECHNIQUE: Multidetector CT imaging of the head and cervical spine was performed following the standard protocol without intravenous contrast. Multiplanar CT image reconstructions of the cervical spine were also generated. RADIATION  DOSE REDUCTION: This exam was performed according to the departmental dose-optimization program which includes automated exposure control, adjustment of the mA and/or kV according to patient size and/or use of iterative reconstruction technique. COMPARISON:  None Available. FINDINGS: CT HEAD FINDINGS Brain: No evidence of acute infarction, hemorrhage, hydrocephalus, extra-axial collection or mass lesion/mass effect. Vascular: No hyperdense vessel or unexpected calcification. Skull: Normal. Negative for fracture or focal lesion. Sinuses/Orbits: No acute finding. Other: Scalp hematoma is noted near the vertex posteriorly just to the right of the midline. CT CERVICAL SPINE FINDINGS Alignment: Mild straightening of the normal cervical lordosis. Skull base and vertebrae: 7 cervical segments are well visualized. Vertebral body height is well maintained. Osteophytic changes and facet hypertrophic changes are noted. No acute fracture or acute facet abnormality is noted. The odontoid is within normal limits. Soft tissues and spinal canal: Surrounding soft tissue structures are within normal limits. Upper chest: Visualized lung apices are within normal limits. Other: None IMPRESSION: CT of the head: Scalp hematoma as described. No acute intracranial abnormality is noted. CT of the cervical spine: Multilevel degenerative changes without acute abnormality. Electronically Signed   By: Alcide Clever M.D.   On: 03/13/2023 19:10    Procedures .Marland KitchenLaceration Repair  Date/Time: 03/15/2023 12:38 PM  Performed by: Bethann Berkshire, MD Authorized by: Bethann Berkshire, MD   Laceration details:    Length (cm):  3 Comments:     Patient has a 3 cm laceration to his occipital area.  Area was cleaned thoroughly with soap and water.  6 staples were used to close the laceration.  No anesthesia was used.  Patient tolerated the procedure well     Medications Ordered in ED Medications - No data to display  ED Course/ Medical Decision  Making/ A&P  Patient with 3 cm laceration to the occipital head.  Area was cleaned with sterile water and 6 staples used to close laceration                           Medical Decision Making Amount and/or Complexity of Data Reviewed Radiology: ordered.  This patient presents to the ED for concern of fall, this involves an extensive number of treatment options, and is a complaint that carries with it a high risk of complications and morbidity.  The differential diagnosis includes head injury   Co morbidities that complicate the patient evaluation  Coronary artery disease   Additional history obtained:  Additional history obtained from patient  External records from outside source obtained and reviewed including hospital record   Lab Tests:  No labs   Imaging Studies ordered:  I ordered imaging studies including right shoulder and CT head I independently visualized and interpreted imaging which showed negative I agree with the radiologist interpretation   Cardiac Monitoring: / EKG:  The patient was maintained on a cardiac monitor.  I personally viewed and interpreted the cardiac monitored which showed an underlying rhythm of: Normal sinus rhythm   Consultations Obtained:  No consultant  Problem List / ED Course / Critical interventions / Medication management  Coronary artery disease and head injury No medicine given.  Patient refused tetanus shot Reevaluation of the patient after these medicines showed that the patient stayed the same I have reviewed the patients home medicines and have made adjustments as needed   Social Determinants of Health:  None   Test / Admission - Considered:  None  Fall with head injury and 3 cm laceration.  Laceration was closed with staples and he will follow-up with PCP         Final Clinical Impression(s) / ED Diagnoses Final diagnoses:  Fall, initial encounter  Injury of head, initial encounter    Rx / DC  Orders ED Discharge Orders     None         Bethann Berkshire, MD 03/15/23 1240

## 2023-03-18 ENCOUNTER — Telehealth: Payer: Self-pay

## 2023-03-18 NOTE — Telephone Encounter (Signed)
     Patient  visit on 03/13/2023  at [facility]  was for fall.  Have you been able to follow up with your primary care physician? Yes   The patient was or was not able to obtain any needed medicine or equipment. No medication prescribed.  Are there diet recommendations that you are having difficulty following? No  Patient expresses understanding of discharge instructions and education provided has no other needs at this time. Yes   Jaelie Aguilera [facility]  [facility] Community Resource Care Guide   [email or web address]  ?? [phone]   Website: [email or web address]  [email or web address]

## 2023-03-19 ENCOUNTER — Ambulatory Visit: Payer: Medicare HMO | Attending: Cardiology | Admitting: Cardiology

## 2023-03-19 ENCOUNTER — Encounter: Payer: Self-pay | Admitting: Internal Medicine

## 2023-03-19 ENCOUNTER — Encounter: Payer: Self-pay | Admitting: Cardiology

## 2023-03-19 VITALS — BP 138/64 | HR 51 | Ht 72.0 in | Wt 191.6 lb

## 2023-03-19 DIAGNOSIS — E782 Mixed hyperlipidemia: Secondary | ICD-10-CM | POA: Diagnosis not present

## 2023-03-19 DIAGNOSIS — I2510 Atherosclerotic heart disease of native coronary artery without angina pectoris: Secondary | ICD-10-CM

## 2023-03-19 DIAGNOSIS — I4891 Unspecified atrial fibrillation: Secondary | ICD-10-CM

## 2023-03-19 DIAGNOSIS — D6869 Other thrombophilia: Secondary | ICD-10-CM | POA: Diagnosis not present

## 2023-03-19 NOTE — Patient Instructions (Addendum)
Medication Instructions:  Your physician recommends that you continue on your current medications as directed. Please refer to the Current Medication list given to you today.  *If you need a refill on your cardiac medications before your next appointment, please call your pharmacy*   Lab Work: None If you have labs (blood work) drawn today and your tests are completely normal, you will receive your results only by: MyChart Message (if you have MyChart) OR A paper copy in the mail If you have any lab test that is abnormal or we need to change your treatment, we will call you to review the results.   Testing/Procedures: None   Follow-Up: At [facility], you and your health needs are our priority.  As part of our continuing mission to provide you with exceptional heart care, we have created designated Provider Care Teams.  These Care Teams include your primary Cardiologist (physician) and Advanced Practice Providers (APPs -  Physician Assistants and Nurse Practitioners) who all work together to provide you with the care you need, when you need it.  We recommend signing up for the patient portal called "MyChart".  Sign up information is provided on this After Visit Summary.  MyChart is used to connect with patients for Virtual Visits (Telemedicine).  Patients are able to view lab/test results, encounter notes, upcoming appointments, etc.  Non-urgent messages can be sent to your provider as well.   To learn more about what you can do with MyChart, go to [email or web address].    Your next appointment:   4-5  month(s)  Provider:   Dina Rich, MD    Other Instructions

## 2023-03-19 NOTE — Progress Notes (Signed)
Clinical Summary Mr. Safi is a 89 y.o.male seen today for follow up of the following medical problems.    1. Afib - appears diagnosed around 01/2017 during admission. Started on eliquis and lopressor 12.5mg  bid. Due to continued dyspnea attempts were made to get him back into NSR, including multiple cardioversions and starting flecanide. Had tolerated lopressor at the time but stopped taking due to HRs in 40s or 50s, had been on 12.5mg  bid at the time.    - was on flecanide previously, discontued after diagnosed with CAD  We tried lopressor 12.5mg  bid last visit but caused fatigue and he stopped taking. Heart rates checked multiple times a day, usually 50s-60s.        - followed by afib clinic/EP - started on dofetilide 09/2020 with DCCV at that time - recurrent afib noted at 10/2020 f/u. Repeat DCCV was planned 11/2020 but cancelled as pt was back in SR. - at last EP appt 12/3 plan was to continue dofetilide - 07/2022 monitor no afib    - no recent palpitations - home HRs 40, highest rates mid 60s.    2. CAD - admit 05/2017 with chest pain - 05/2017 cath as reported below, received a DES to LAD - 05/2017 echo LVEF 60-65%, no WMAs   - seen in ER 10/2019 with chest pain - trop neg x 1. EKG afib without acute ischemic changes. CXR no acute process    10/2019 nuclear stress no ischemia - no significant symptoms.      - Admit 07/2022 with unstable angina. 07/2022 cath: LM 25%, diatal LM/ostial LAD 70% borderline RFR, prox LAD 50%, ostial RCA 90%, mid RCA 50%, acute marginal 75%. DES to RCA    -as far as antianginals avoid beta blockers due to low HRs. On dofetilide would avoid ranexa. Soft bp's at times but would try starting back his norvasc 2.5mg  daily. Uses sildenafil prn would avoid imdur.     08/2022 ER visit with chest pain, negative evaluation for ACS       10/06/22 visit with chest pain to ER - negative workup for ACS - pain developed in bed. Left sided pain  pressure. 5/10 in severity. No other associated symptoms. Worst with deep breathing. Pain last 6-8 hours constant - no recurrent symptoms - compliant with meds    - no chest pains.      3. Carotid stenosis - history of right CEA - 01/2018 carotid US RICA 1-39% and patent CEA, left 1-39%. Annual checks   - followed by vascular. Has appt next month with repeat US   01/2022 RICA 1-39%, LICA 1-39%   4. Hyperlipidemia - intolerant to statin, has been on low dose pravastatin.  - Dr Rennis Golden had discussed repatha, however patient declinded due to concern about too low cholesterol - 05/2018 TC 195 HDL 39 TG 73 LDL 141   - taking pravastatin 40mg  MWF - has been resistant to repatha.     10/2019 TC 243 HDL 44 LDL 179. He was off statin at that time but has since restarted - most recent labs with pcp    07/2022 RC 201 TG 59 HDL 38 LDL 151   07/16/22 intolerant to pravastatin, started zetia.     5. HTN - home bp's 140s/50s, later in the day 160s/50s.      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 Son in  law is Charisse Klinefelter. 2 sons and daughter are involved in family business Past Medical History:  Diagnosis Date   Arthritis    neck    Atrial fibrillation    a. new-onset in 01/2017. Started on Eliquis   CAD (coronary artery disease), native coronary artery 05/16/2017   LAD 95%>>0 w/ 2.5 x 16 mm Synergy DES, D2 40%, RCA 50%, EF 55-65%, LVEDP mod elevated   Calcification of coronary artery    a. mild, nonobstructive CAD by cath in 2009.   Cancer of skin of chest    Carotid artery occlusion    a. s/p R CEA in 2014   Colon polyps    Hyperlipidemia    takes Pravastatin every other day   Pneumonia ~ 01/2013     Allergies  Allergen Reactions   Lipitor [Atorvastatin Calcium] Other (See Comments)    Muscle pain   Vantin [Cefpodoxime] Rash     Current Outpatient Medications  Medication  Sig Dispense Refill   ALPHA LIPOIC ACID PO Take 250 mg by mouth daily.     amLODipine (NORVASC) 2.5 MG tablet Take 1 tablet (2.5 mg total) by mouth daily. 90 tablet 1   clopidogrel (PLAVIX) 75 MG tablet Take 1 tablet (75 mg total) by mouth daily with breakfast. 30 tablet 11   Coenzyme Q10 (COQ10) 400 MG CAPS Take 800 mg by mouth in the morning and at bedtime.      dofetilide (TIKOSYN) 250 MCG capsule Take 1 capsule (250 mcg total) by mouth 2 (two) times daily. 180 capsule 3   ELIQUIS 5 MG TABS tablet TAKE (1) TABLET BY MOUTH TWICE DAILY. 60 tablet 6   ezetimibe (ZETIA) 10 MG tablet Take 1 tablet (10 mg total) by mouth daily. 90 tablet 3   Magnesium 500 MG TABS Take 500 mg by mouth daily.      memantine (NAMENDA) 5 MG tablet Take 5 mg by mouth daily.     methocarbamol (ROBAXIN) 500 MG tablet Take 1 tablet (500 mg total) by mouth 2 (two) times daily. 20 tablet 0   Multiple Vitamin (MULTIVITAMIN WITH MINERALS) TABS tablet Take 1 tablet by mouth daily.     nitroGLYCERIN (NITROSTAT) 0.4 MG SL tablet Place 1 tablet (0.4 mg total) under the tongue every 5 (five) minutes as needed for chest pain. 25 tablet 1   pravastatin (PRAVACHOL) 20 MG tablet Take 1 tablet (20 mg total) by mouth every Monday, Wednesday, and Friday. 30 tablet 3   sildenafil (REVATIO) 20 MG tablet Take 2-3 tablets (40-60 mg total) by mouth as needed (sexual activity). Do not take in combination with nitro SL.     zinc gluconate 50 MG tablet Take 50 mg by mouth daily.     No current facility-administered medications for this visit.     Past Surgical History:  Procedure Laterality Date   CARDIAC CATHETERIZATION  06/09/1999   mild CAD (LAD, diagonal, RCA) - Dr. Langston Reusing   CARDIOVERSION N/A 03/25/2017   Procedure: CARDIOVERSION;  Surgeon: Chrystie Nose, MD;  Location: [facility] ENDOSCOPY;  Service: Cardiovascular;  Laterality: N/A;   CARDIOVERSION N/A 09/28/2020   Procedure: CARDIOVERSION;  Surgeon: Chilton Si, MD;  Location: [facility]  ENDOSCOPY;  Service: Cardiovascular;  Laterality: N/A;   CAROTID DOPPLER  02/2012   60-79% RICA stenosis, 40-59% LICA stenosis (prior to carotid endarterectomy)   COLONOSCOPY     CORONARY ATHERECTOMY N/A 07/09/2022   Procedure: CORONARY ATHERECTOMY;  Surgeon: Tonny Bollman, MD;  Location: [facility] INVASIVE CV  LAB;  Service: Cardiovascular;  Laterality: N/A;   CORONARY PRESSURE/FFR STUDY N/A 07/09/2022   Procedure: INTRAVASCULAR PRESSURE WIRE/FFR STUDY;  Surgeon: Tonny Bollman, MD;  Location: [facility] INVASIVE CV LAB;  Service: Cardiovascular;  Laterality: N/A;   CORONARY STENT INTERVENTION N/A 05/16/2017   Procedure: Coronary Stent Intervention;  Surgeon: Corky Crafts, MD;  Location: [facility] INVASIVE CV LAB;  Service: Cardiovascular;  Laterality: N/A;   CORONARY STENT INTERVENTION N/A 07/09/2022   Procedure: CORONARY STENT INTERVENTION;  Surgeon: Tonny Bollman, MD;  Location: [facility] INVASIVE CV LAB;  Service: Cardiovascular;  Laterality: N/A;   CORONARY ULTRASOUND/IVUS N/A 07/09/2022   Procedure: Intravascular Ultrasound/IVUS;  Surgeon: Tonny Bollman, MD;  Location: [facility] INVASIVE CV LAB;  Service: Cardiovascular;  Laterality: N/A;   ENDARTERECTOMY Right 11/10/2013   Procedure: ENDARTERECTOMY CAROTID;  Surgeon: Fransisco Hertz, MD;  Location: [facility] OR;  Service: Vascular;  Laterality: Right;   INGUINAL HERNIA REPAIR Left    LEFT HEART CATH AND CORONARY ANGIOGRAPHY N/A 05/16/2017   Procedure: Left Heart Cath and Coronary Angiography;  Surgeon: Corky Crafts, MD;  Location: [facility] INVASIVE CV LAB;  Service: Cardiovascular;  Laterality: N/A;   LEFT HEART CATH AND CORONARY ANGIOGRAPHY N/A 07/09/2022   Procedure: LEFT HEART CATH AND CORONARY ANGIOGRAPHY;  Surgeon: Tonny Bollman, MD;  Location: [facility] INVASIVE CV LAB;  Service: Cardiovascular;  Laterality: N/A;   MOLE REMOVAL  1990s   "chest"   NM MYOCAR PERF WALL MOTION  04/2011   bruce myoview - perfusion defect in inferior myocardium (diaphragmatic attenuation), remaining  myocardium with normal perfusion, EF 67%   PATCH ANGIOPLASTY Right 11/10/2013   Procedure: PATCH ANGIOPLASTY;  Surgeon: Fransisco Hertz, MD;  Location: [facility] OR;  Service: Vascular;  Laterality: Right;   PILONIDAL CYST DRAINAGE     SHOULDER OPEN ROTATOR CUFF REPAIR Bilateral 1998 - 2001   SKIN CANCER EXCISION  ~ 2015   "chest"   TEMPORARY PACEMAKER N/A 07/09/2022   Procedure: TEMPORARY PACEMAKER;  Surgeon: Tonny Bollman, MD;  Location: [facility] INVASIVE CV LAB;  Service: Cardiovascular;  Laterality: N/A;   TONSILLECTOMY       Allergies  Allergen Reactions   Lipitor [Atorvastatin Calcium] Other (See Comments)    Muscle pain   Vantin [Cefpodoxime] Rash      Family History  Problem Relation Age of Onset   Stroke Father    Hypertension Mother    Prostate cancer Son    Stroke Brother    Cancer Brother        spine     Social History Mr. Dorrance reports that he quit smoking about 50 years ago. His smoking use included cigarettes. He has a 18.00 pack-year smoking history. He has never used smokeless tobacco. Mr. Kann reports current alcohol use of about 14.0 standard drinks of alcohol per week.   Review of Systems CONSTITUTIONAL: No weight loss, fever, chills, weakness or fatigue.  HEENT: Eyes: No visual loss, blurred vision, double vision or yellow sclerae.No hearing loss, sneezing, congestion, runny nose or sore throat.  SKIN: No rash or itching.  CARDIOVASCULAR: per hpi RESPIRATORY: No shortness of breath, cough or sputum.  GASTROINTESTINAL: No anorexia, nausea, vomiting or diarrhea. No abdominal pain or blood.  GENITOURINARY: No burning on urination, no polyuria NEUROLOGICAL: No headache, dizziness, syncope, paralysis, ataxia, numbness or tingling in the extremities. No change in bowel or bladder control.  MUSCULOSKELETAL: No muscle, back pain, joint pain or stiffness.  LYMPHATICS: No enlarged nodes. No history of splenectomy.  PSYCHIATRIC: No history  of depression or anxiety.   ENDOCRINOLOGIC: No reports of sweating, cold or heat intolerance. No polyuria or polydipsia.  Marland Kitchen   Physical Examination Today's Vitals   03/19/23 0809  BP: 138/64  Pulse: (!) 51  SpO2: 97%  Weight: 191 lb 9.6 oz (86.9 kg)  Height: 6' (1.829 m)   Body mass index is 25.99 kg/m.  Gen: resting comfortably, no acute distress HEENT: no scleral icterus, pupils equal round and reactive, no palptable cervical adenopathy,  CV: regular, brady, no m/r/g no jvd Resp: Clear to auscultation bilaterally GI: abdomen is soft, non-tender, non-distended, normal bowel sounds, no hepatosplenomegaly MSK: extremities are warm, no edema.  Skin: warm, no rash Neuro:  no focal deficits Psych: appropriate affect   Diagnostic Studies   05/2017 cath   Mid RCA lesion, 50 %stenosed. Ost RCA lesion, 25 %stenosed. Lesion noted in small RV marginal. LM lesion, 25 %stenosed. 2nd Diag lesion, 40 %stenosed. Mid LAD-1 lesion, 20 %stenosed. Mid LAD-2 lesion, 95 %stenosed. A STENT SYNERGY DES 2.5X16 drug eluting stent was successfully placed, postdilated to 3.0 mm. Post intervention, there is a 0% residual stenosis. The left ventricular systolic function is normal. LV end diastolic pressure is moderately elevated. The left ventricular ejection fraction is 55-65% by visual estimate. There is no aortic valve stenosis.   LAD was the culprit for his unstable angina. He had symptoms during the procedure at rest. These resolved after stent placement. Watch overnight. Continue IV Angiomax for another hour.   Loaded with Plavix today. We'll start 75 mg daily tomorrow. Given aspirin today as well. Would plan for aspirin, Plavix and Eliquis for 30 days. After 30 days, would stop the aspirin and continue Plavix and Eliquis. A synergy drug-eluting stent was used since he is on long-term anticoagulation. If there is a bleeding complication, his antiplatelet therapy could be stopped sooner than usual. Ideally, he would get  6-12 months of Plavix depending on his bleeding risk profile.   Possible discharge tomorrow if he does well tonight and with cardiac rehabilitation tomorrow.     05/2017 echo Study Conclusions   - Left ventricle: The cavity size was normal. Wall thickness was   increased in a pattern of mild LVH. Systolic function was normal.   The estimated ejection fraction was in the range of 60% to 65%.   Wall motion was normal; there were no regional wall motion   abnormalities. The study is not technically sufficient to allow   evaluation of LV diastolic function. - Aortic valve: Mildly calcified annulus. Trileaflet; moderately   thickened leaflets. Valve area (VTI): 2.98 cm^2. Valve area   (Vmax): 2.7 cm^2. Valve area (Vmean): 2.56 cm^2. - Left atrium: The atrium was mildly dilated. - Technically adequate study.     10/2019 nuclear stress Unable to achieve adequate heart rate response with GXT, Lexiscan utilized. No diagnostic ST segment changes. Small, mild intensity, inferior/inferoseptal defect extending from apex to base that is fixed (actually more prominent at rest) and consistent with soft tissue attenuation. No reversible defects to indicate ischemia. This is a low risk study. Nuclear stress EF: 69%.     07/2022 monitor 7 day monitor   Frequent isolated PACs. Rare couplets and triplets. Rare runs of SVT longest 7  beats   Rare ventricular ectopy in the form of isolated PVCs   Min HR 32, Max HR 80, avg HR 54. Sinus brady to low 30s at times in early and late afternoon. Nocturnal pauses up to 3.2 seconds.  No patient reported symptoms    Assessment and Plan   1. Afib/acquired thrombophilia - overall doing well on dofetilide, some evidence of conduction disease would avoid av nodal agents - occasional low HRs, no clear indication of symptoms. Recent fall appears may have been mechanical, no recurrent falls - continue current meds including eliquis for stroke prevention   2.  CAD - recent stenting as reported above - no recent symptoms - f/u in August, can d/c plavix at that time.    3. Hyperlipidemia - limited in statin tolerance, wants to retry pravastatin 20mg  again. Continue zetia -resistant to [id number]'s         Antoine Poche, M.D.

## 2023-03-22 DIAGNOSIS — S0101XA Laceration without foreign body of scalp, initial encounter: Secondary | ICD-10-CM | POA: Diagnosis not present

## 2023-03-22 DIAGNOSIS — M25511 Pain in right shoulder: Secondary | ICD-10-CM | POA: Diagnosis not present

## 2023-03-29 ENCOUNTER — Emergency Department (HOSPITAL_COMMUNITY): Payer: Medicare HMO

## 2023-03-29 ENCOUNTER — Other Ambulatory Visit: Payer: Self-pay

## 2023-03-29 ENCOUNTER — Emergency Department (HOSPITAL_COMMUNITY)
Admission: EM | Admit: 2023-03-29 | Discharge: 2023-03-29 | Discharge status: Against Medical Advice | Payer: Medicare HMO | Attending: Emergency Medicine | Admitting: Emergency Medicine

## 2023-03-29 ENCOUNTER — Encounter (HOSPITAL_COMMUNITY): Payer: Self-pay

## 2023-03-29 DIAGNOSIS — R079 Chest pain, unspecified: Secondary | ICD-10-CM | POA: Diagnosis not present

## 2023-03-29 DIAGNOSIS — R1013 Epigastric pain: Secondary | ICD-10-CM | POA: Diagnosis not present

## 2023-03-29 DIAGNOSIS — Z5321 Procedure and treatment not carried out due to patient leaving prior to being seen by health care provider: Secondary | ICD-10-CM | POA: Diagnosis not present

## 2023-03-29 LAB — CBC
HCT: 40.2 % (ref 39.0–52.0)
Hemoglobin: 14 g/dL (ref 13.0–17.0)
MCH: 31.7 pg (ref 26.0–34.0)
MCHC: 34.8 g/dL (ref 30.0–36.0)
MCV: 91 fL (ref 80.0–100.0)
Platelets: 261 10*3/uL (ref 150–400)
RBC: 4.42 MIL/uL (ref 4.22–5.81)
RDW: 13.2 % (ref 11.5–15.5)
WBC: 5.8 10*3/uL (ref 4.0–10.5)
nRBC: 0 % (ref 0.0–0.2)

## 2023-03-29 LAB — COMPREHENSIVE METABOLIC PANEL
ALT: 20 U/L (ref 0–44)
AST: 28 U/L (ref 15–41)
Albumin: 3.9 g/dL (ref 3.5–5.0)
Alkaline Phosphatase: 86 U/L (ref 38–126)
Anion gap: 9 (ref 5–15)
BUN: 16 mg/dL (ref 8–23)
CO2: 25 mmol/L (ref 22–32)
Calcium: 9.2 mg/dL (ref 8.9–10.3)
Chloride: 96 mmol/L — ABNORMAL LOW (ref 98–111)
Creatinine, Ser: 1.06 mg/dL (ref 0.61–1.24)
GFR, Estimated: >60 mL/min (ref >60)
Glucose, Bld: 107 mg/dL — ABNORMAL HIGH (ref 70–99)
Potassium: 4.4 mmol/L (ref 3.5–5.1)
Sodium: 130 mmol/L — ABNORMAL LOW (ref 135–145)
Total Bilirubin: 0.9 mg/dL (ref 0.3–1.2)
Total Protein: 7.5 g/dL (ref 6.5–8.1)

## 2023-03-29 LAB — LIPASE, BLOOD: Lipase: 36 U/L (ref 11–51)

## 2023-03-29 LAB — TROPONIN I (HIGH SENSITIVITY): Troponin I (High Sensitivity): 7 ng/L (ref <18)

## 2023-03-29 NOTE — ED Notes (Signed)
Pt notified registration staff that he was not going to wait any longer.

## 2023-03-29 NOTE — ED Provider Triage Note (Signed)
Emergency Medicine Provider Triage Evaluation Note  Sean Miranda , a 89 y.o. male  was evaluated in triage.  Pt complains of epigastric abdominal achy pain that started this afternoon. No history of this.  No previous abdominal surgeries. No nausea, vomiting, diarrhea, chest pain, SOB, urinary symptoms, or other complaints. Overall improving since onset.   Review of Systems  Positive: See HPI Negative: See HPI  Physical Exam  BP (!) 197/63 (BP Location: Left Arm)   Pulse (!) 57   Temp 98.4 F (36.9 C) (Oral)   Resp 18   Ht 6' (1.829 m)   Wt 83.5 kg   SpO2 98%   BMI 24.95 kg/m  Gen:   Awake, no distress   Resp:  Normal effort  MSK:   Moves extremities without difficulty  Other:  Abdomen soft, nontender, no rebound, guarding, peritoneal signs, or CVA tenderness  Medical Decision Making  Medically screening exam initiated at 4:58 PM.  Appropriate orders placed.  Sean Miranda was informed that the remainder of the evaluation will be completed by another provider, this initial triage assessment does not replace that evaluation, and the importance of remaining in the ED until their evaluation is complete.     Tonette Lederer, PA-C 03/29/23 1659

## 2023-03-29 NOTE — ED Triage Notes (Signed)
Pt presents gradual onset epigastric pain that started while he was sitting and reading. Pain is described as an ache. Denies CP, ShOB, N/VD.

## 2023-04-02 DIAGNOSIS — M25512 Pain in left shoulder: Secondary | ICD-10-CM | POA: Diagnosis not present

## 2023-04-05 ENCOUNTER — Other Ambulatory Visit: Payer: Self-pay | Admitting: Internal Medicine

## 2023-04-05 DIAGNOSIS — I4891 Unspecified atrial fibrillation: Secondary | ICD-10-CM

## 2023-04-05 NOTE — Telephone Encounter (Signed)
Prescription refill request for Eliquis received. Indication:Afib  Last office visit: 03/19/23 ([facility])  Scr: 1.06 (03/29/23)  Age: 89 Weight: 83.5kg  Appropriate dose. Refill sent.

## 2023-04-10 DIAGNOSIS — I2510 Atherosclerotic heart disease of native coronary artery without angina pectoris: Secondary | ICD-10-CM | POA: Diagnosis not present

## 2023-04-10 DIAGNOSIS — I10 Essential (primary) hypertension: Secondary | ICD-10-CM | POA: Diagnosis not present

## 2023-04-10 DIAGNOSIS — R413 Other amnesia: Secondary | ICD-10-CM | POA: Diagnosis not present

## 2023-04-17 ENCOUNTER — Encounter (HOSPITAL_COMMUNITY): Payer: Self-pay

## 2023-04-17 ENCOUNTER — Other Ambulatory Visit: Payer: Self-pay

## 2023-04-17 ENCOUNTER — Emergency Department (HOSPITAL_COMMUNITY)
Admission: EM | Admit: 2023-04-17 | Discharge: 2023-04-17 | Disposition: A | Discharge status: Home / Self Care | Payer: Medicare HMO | Attending: Emergency Medicine | Admitting: Emergency Medicine

## 2023-04-17 DIAGNOSIS — Z7901 Long term (current) use of anticoagulants: Secondary | ICD-10-CM | POA: Diagnosis not present

## 2023-04-17 DIAGNOSIS — R000 Tachycardia, unspecified: Secondary | ICD-10-CM | POA: Diagnosis not present

## 2023-04-17 DIAGNOSIS — I10 Essential (primary) hypertension: Secondary | ICD-10-CM | POA: Insufficient documentation

## 2023-04-17 DIAGNOSIS — Z79899 Other long term (current) drug therapy: Secondary | ICD-10-CM | POA: Diagnosis not present

## 2023-04-17 DIAGNOSIS — I2510 Atherosclerotic heart disease of native coronary artery without angina pectoris: Secondary | ICD-10-CM | POA: Insufficient documentation

## 2023-04-17 LAB — CBC WITH DIFFERENTIAL/PLATELET
Abs Immature Granulocytes: 0.01 10*3/uL (ref 0.00–0.07)
Basophils Absolute: 0 10*3/uL (ref 0.0–0.1)
Basophils Relative: 0 %
Eosinophils Absolute: 0.2 10*3/uL (ref 0.0–0.5)
Eosinophils Relative: 3 %
HCT: 40.4 % (ref 39.0–52.0)
Hemoglobin: 13.9 g/dL (ref 13.0–17.0)
Immature Granulocytes: 0 %
Lymphocytes Relative: 28 %
Lymphs Abs: 1.9 10*3/uL (ref 0.7–4.0)
MCH: 31.7 pg (ref 26.0–34.0)
MCHC: 34.4 g/dL (ref 30.0–36.0)
MCV: 92 fL (ref 80.0–100.0)
Monocytes Absolute: 0.6 10*3/uL (ref 0.1–1.0)
Monocytes Relative: 9 %
Neutro Abs: 4 10*3/uL (ref 1.7–7.7)
Neutrophils Relative %: 60 %
Platelets: 221 10*3/uL (ref 150–400)
RBC: 4.39 MIL/uL (ref 4.22–5.81)
RDW: 13 % (ref 11.5–15.5)
WBC: 6.8 10*3/uL (ref 4.0–10.5)
nRBC: 0 % (ref 0.0–0.2)

## 2023-04-17 LAB — BASIC METABOLIC PANEL
Anion gap: 6 (ref 5–15)
BUN: 14 mg/dL (ref 8–23)
CO2: 26 mmol/L (ref 22–32)
Calcium: 8.9 mg/dL (ref 8.9–10.3)
Chloride: 101 mmol/L (ref 98–111)
Creatinine, Ser: 1.01 mg/dL (ref 0.61–1.24)
GFR, Estimated: >60 mL/min (ref >60)
Glucose, Bld: 88 mg/dL (ref 70–99)
Potassium: 4.3 mmol/L (ref 3.5–5.1)
Sodium: 133 mmol/L — ABNORMAL LOW (ref 135–145)

## 2023-04-17 LAB — LACTIC ACID, PLASMA: Lactic Acid, Venous: 0.8 mmol/L (ref 0.5–1.9)

## 2023-04-17 LAB — MAGNESIUM: Magnesium: 2.3 mg/dL (ref 1.7–2.4)

## 2023-04-17 NOTE — ED Triage Notes (Signed)
Pt comes in from home with complaints of low BP this morning and high HR. Pt states feeling lightheaded and off balance. Pt denies falling or medications changes. Pt denies hx of same experience.

## 2023-04-17 NOTE — Discharge Instructions (Signed)
The workup in the emergency room is reassuring.  Your telemetry monitoring has been reassuring.  Your blood pressure has been stable.  It is unclear what led to the derangement that you noted this morning.  We recommend no changes to your medications or overall regimen.  Return to the ER if you start having severe palpitations, dizziness, near fainting, chest pain or shortness of breath.

## 2023-04-17 NOTE — ED Provider Notes (Signed)
[place] EMERGENCY DEPARTMENT AT [facility] Provider Note   CSN: [id number] Arrival date & time: 04/17/23  5366     History  Chief Complaint  Patient presents with   Hypotension    [name] is a 89 y.o. male.  HPI    89 year old male comes in with chief complaint of low blood pressure.  Patient has history of A-fib, hypertension, CAD.  Patient states that he checks his vital signs every morning.  This morning when he checked his vital signs, his blood pressure initially was in the 70s and on the repeat it was still less than 100.  His heart rate on the other side was over 100 on both occasions.  Patient did not have any associated chest pain, shortness of breath, palpitations, dizziness.  However, he was alarmed with the sudden change in his vital signs and decided to come to the emergency room.  He has not taken his morning medications.  There are no new medications.  Patient's family is at the bedside.  Patient indicates that he has not had any recent bouts of illnesses.  He denies any URI-like symptoms, cough, UTI-like symptoms, fevers, chills.  Home Medications Prior to Admission medications   Medication Sig Start Date End Date Taking? Authorizing Provider  ALPHA LIPOIC ACID PO Take 250 mg by mouth daily.   Yes [provider]  amLODipine (NORVASC) 2.5 MG tablet Take 1 tablet (2.5 mg total) by mouth daily. 08/30/22  Yes Kommor, Madison, MD  apixaban (ELIQUIS) 5 MG TABS tablet TAKE (1) TABLET BY MOUTH TWICE DAILY. 04/05/23  Yes Antoine Poche, MD  clopidogrel (PLAVIX) 75 MG tablet Take 1 tablet (75 mg total) by mouth daily with breakfast. 07/11/22  Yes Duke, Roe Rutherford, PA  Coenzyme Q10 (COQ10) 400 MG CAPS Take 800 mg by mouth in the morning and at bedtime.    Yes [provider]  dofetilide (TIKOSYN) 250 MCG capsule Take 1 capsule (250 mcg total) by mouth 2 (two) times daily. 12/17/22  Yes Marinus Maw, MD  ezetimibe (ZETIA) 10 MG  tablet Take 1 tablet (10 mg total) by mouth daily. 07/16/22  Yes Sharlene Dory, PA-C  Magnesium 500 MG TABS Take 500 mg by mouth daily.    Yes [provider]  memantine (NAMENDA) 5 MG tablet Take 5 mg by mouth daily. 08/17/22  Yes [provider]  Multiple Vitamin (MULTIVITAMIN WITH MINERALS) TABS tablet Take 1 tablet by mouth daily.   Yes [provider]  nitroGLYCERIN (NITROSTAT) 0.4 MG SL tablet Place 1 tablet (0.4 mg total) under the tongue every 5 (five) minutes as needed for chest pain. 07/11/22  Yes Antoine Poche, MD  pravastatin (PRAVACHOL) 20 MG tablet Take 1 tablet (20 mg total) by mouth every Monday, Wednesday, and Friday. 10/22/22 07/29/23 Yes BranchDorothe Pea, MD  zinc gluconate 50 MG tablet Take 50 mg by mouth daily.   Yes [provider]  methocarbamol (ROBAXIN) 500 MG tablet Take 1 tablet (500 mg total) by mouth 2 (two) times daily. Patient not taking: Reported on 04/17/2023 10/28/22   Burgess Amor, PA-C  sildenafil (REVATIO) 20 MG tablet Take 2-3 tablets (40-60 mg total) by mouth as needed (sexual activity). Do not take in combination with nitro SL. Patient not taking: Reported on 04/17/2023 07/10/22   Marcelino Duster, PA      Allergies    Lipitor [atorvastatin calcium] and Vantin [cefpodoxime]    Review of Systems  Review of Systems  All other systems reviewed and are negative.   Physical Exam Updated Vital Signs BP 119/66   Pulse 81   Temp 97.6 F (36.4 C) (Oral)   Resp 16   Ht 6' (1.829 m)   Wt 82.6 kg   SpO2 98%   BMI 24.68 kg/m  Physical Exam Vitals and nursing note reviewed.  Constitutional:      Appearance: He is well-developed.  HENT:     Head: Atraumatic.  Cardiovascular:     Rate and Rhythm: Normal rate. Rhythm irregular.  Pulmonary:     Effort: Pulmonary effort is normal.  Musculoskeletal:     Cervical back: Neck supple.     Right lower leg: No edema.     Left lower leg: No edema.  Skin:    General:  Skin is warm.  Neurological:     Mental Status: He is alert and oriented to person, place, and time.     ED Results / Procedures / Treatments   Labs (all labs ordered are listed, but only abnormal results are displayed) Labs Reviewed  BASIC METABOLIC PANEL - Abnormal; Notable for the following components:      Result Value   Sodium 133 (*)    All other components within normal limits  CBC WITH DIFFERENTIAL/PLATELET  MAGNESIUM  LACTIC ACID, PLASMA    EKG EKG Interpretation  Date/Time:  Wednesday Apr 17 2023 09:49:55 EDT Ventricular Rate:  94 PR Interval:    QRS Duration: 151 QT Interval:  401 QTC Calculation: 502 R Axis:   96 Text Interpretation: Atrial fibrillation RBBB and LPFB No acute changes No significant change since last tracing Confirmed by Derwood Kaplan [phone]) on 04/17/2023 12:47:23 PM  Radiology No results found.  Procedures Procedures    Medications Ordered in ED Medications - No data to display  ED Course/ Medical Decision Making/ A&P                             Medical Decision Making Amount and/or Complexity of Data Reviewed Labs: ordered.   89 year old male with history of CAD, A-fib comes in with chief complaint of abnormal vital signs at home.  In the ER, patient's heart rate is less than 90, systolic blood pressure during my assessment was 100.  At no point was patient symptomatic.  Differential diagnosis considered for him includes medication side effects, SVT, electrolyte abnormality, AKI, dehydration  Plan is to get basic labs and place patient on telemetry monitoring in the ER.  Reassessment 1: Patient reassessed at 69 PM.  He states that he still feels well.  His blood pressure consistently over 100.  Heart rate consistently less than 90.  Reassessment 2: At 1 PM, patient still asymptomatic.  On telemetry we have not seen any dysrhythmia.  Patient is stable for discharge.  Final Clinical Impression(s) / ED Diagnoses Final  diagnoses:  Tachycardia    Rx / DC Orders ED Discharge Orders     None         Derwood Kaplan, MD 04/17/23 1320

## 2023-04-18 ENCOUNTER — Ambulatory Visit: Payer: Medicare HMO | Attending: Cardiology | Admitting: Cardiology

## 2023-04-18 ENCOUNTER — Ambulatory Visit (INDEPENDENT_AMBULATORY_CARE_PROVIDER_SITE_OTHER): Payer: Medicare HMO

## 2023-04-18 ENCOUNTER — Encounter: Payer: Self-pay | Admitting: Cardiology

## 2023-04-18 VITALS — BP 122/78 | HR 83 | Ht 72.0 in | Wt 191.2 lb

## 2023-04-18 DIAGNOSIS — I4891 Unspecified atrial fibrillation: Secondary | ICD-10-CM

## 2023-04-18 DIAGNOSIS — I959 Hypotension, unspecified: Secondary | ICD-10-CM

## 2023-04-18 NOTE — Patient Instructions (Signed)
Medication Instructions:  Your physician has recommended you make the following change in your medication:   -Stop Norvasc (Amlodipine)  *If you need a refill on your cardiac medications before your next appointment, please call your pharmacy*   Lab Work: None If you have labs (blood work) drawn today and your tests are completely normal, you will receive your results only by: MyChart Message (if you have MyChart) OR A paper copy in the mail If you have any lab test that is abnormal or we need to change your treatment, we will call you to review the results.   Testing/Procedures: 7 day zio   Follow-Up: At [facility], you and your health needs are our priority.  As part of our continuing mission to provide you with exceptional heart care, we have created designated Provider Care Teams.  These Care Teams include your primary Cardiologist (physician) and Advanced Practice Providers (APPs -  Physician Assistants and Nurse Practitioners) who all work together to provide you with the care you need, when you need it.  We recommend signing up for the patient portal called "MyChart".  Sign up information is provided on this After Visit Summary.  MyChart is used to connect with patients for Virtual Visits (Telemedicine).  Patients are able to view lab/test results, encounter notes, upcoming appointments, etc.  Non-urgent messages can be sent to your provider as well.   To learn more about what you can do with MyChart, go to [email or web address].    Your next appointment:   Keep scheduled follow up in September   Provider:   Dina Rich, MD    Other Instructions Sean Miranda- Long Term Monitor Instructions   Your physician has requested you wear your ZIO patch monitor___7____days.   This is a single patch monitor.  [employer] supplies one patch monitor per enrollment.  Additional stickers are not available.   Please do not apply patch if you will be having a Nuclear Stress Test,  Echocardiogram, Cardiac CT, MRI, or Chest Xray during the time frame you would be wearing the monitor. The patch cannot be worn during these tests.  You cannot remove and re-apply the ZIO XT patch monitor.   Your ZIO patch monitor will be sent USPS Priority mail from [facility] directly to your home address. The monitor may also be mailed to a PO BOX if home delivery is not available.   It may take 3-5 days to receive your monitor after you have been enrolled.   Once you have received you monitor, please review enclosed instructions.  Your monitor has already been registered assigning a specific monitor serial # to you.   Applying the monitor   Shave hair from upper left chest.   Hold abrader disc by orange tab.  Rub abrader in 40 strokes over left upper chest as indicated in your monitor instructions.   Clean area with 4 enclosed alcohol pads .  Use all pads to assure are is cleaned thoroughly.  Let dry.   Apply patch as indicated in monitor instructions.  Patch will be place under collarbone on left side of chest with arrow pointing upward.   Rub patch adhesive wings for 2 minutes.Remove white label marked "1".  Remove white label marked "2".  Rub patch adhesive wings for 2 additional minutes.   While looking in a mirror, press and release button in center of patch.  A small green light will flash 3-4 times .  This will be your only indicator the monitor has  been turned on.     Do not shower for the first 24 hours.  You may shower after the first 24 hours.   Press button if you feel a symptom. You will hear a small click.  Record Date, Time and Symptom in the Patient Log Book.   When you are ready to remove patch, follow instructions on last 2 pages of Patient Log Book.  Stick patch monitor onto last page of Patient Log Book.   Place Patient Log Book in [place] box.  Use locking tab on box and tape box closed securely.  The [employer] has [employer] on it.  Please  place in mailbox as soon as possible.  Your physician should have your test results approximately 7 days after the monitor has been mailed back to [facility].   Call Sean Miranda at [phone] if you have questions regarding your ZIO XT patch monitor.  Call them immediately if you see an orange light blinking on your monitor.   If your monitor falls off in less than 4 days contact our Monitor department at [phone].  If your monitor becomes loose or falls off after 4 days call [employer] at [phone] for suggestions on securing your monitor.

## 2023-04-18 NOTE — Progress Notes (Signed)
Clinical Summary Mr. Knipe is a 89 y.o.male seen today for follow up of the following medical problems.   This is a focused visit on recent ER visit with low home bp's as well as recurrent afib  1.Low bp - ER visit yesterday with home bp's in the 70s.  - ER bp 119/66 - Cr 1 BUN 14 WBC 6.8 Hgb 13.9 Lactic acid 0.8  - low energy several x 2-3 months - no palpitaitons. Some SOB x 1 year - checked home vitals, reports 79/54 and HR 100.  - no other symptoms - 6-8 glasses of water per day.      2. Afib - appears diagnosed around 01/2017 during admission. Started on eliquis and lopressor 12.5mg  bid. Due to continued dyspnea attempts were made to get him back into NSR, including multiple cardioversions and starting flecanide. Had tolerated lopressor at the time but stopped taking due to HRs in 40s or 50s, had been on 12.5mg  bid at the time.    - was on flecanide previously, discontued after diagnosed with CAD  We tried lopressor 12.5mg  bid last visit but caused fatigue and he stopped taking. Heart rates checked multiple times a day, usually 50s-60s.        - followed by afib clinic/EP - started on dofetilide 09/2020 with DCCV at that time - recurrent afib noted at 10/2020 f/u. Repeat DCCV was planned 11/2020 but cancelled as pt was back in SR. - at last EP appt 12/3 plan was to continue dofetilide - 07/2022 monitor no afib     - home HRs 40, highest rates mid 60s typically. Last few days home HRs 80s to 90s.  - denies specific palpitations. Has had some generalized fatigue last few months   Other medical problems not addressed this visit   2. CAD - admit 05/2017 with chest pain - 05/2017 cath as reported below, received a DES to LAD - 05/2017 echo LVEF 60-65%, no WMAs   - seen in ER 10/2019 with chest pain - trop neg x 1. EKG afib without acute ischemic changes. CXR no acute process    10/2019 nuclear stress no ischemia - no significant symptoms.      - Admit 07/2022  with unstable angina. 07/2022 cath: LM 25%, diatal LM/ostial LAD 70% borderline RFR, prox LAD 50%, ostial RCA 90%, mid RCA 50%, acute marginal 75%. DES to RCA    -as far as antianginals avoid beta blockers due to low HRs. On dofetilide would avoid ranexa. Soft bp's at times but would try starting back his norvasc 2.5mg  daily. Uses sildenafil prn would avoid imdur.     08/2022 ER visit with chest pain, negative evaluation for ACS       10/06/22 visit with chest pain to ER - negative workup for ACS - pain developed in bed. Left sided pain pressure. 5/10 in severity. No other associated symptoms. Worst with deep breathing. Pain last 6-8 hours constant - no recurrent symptoms - compliant with meds     - no chest pains.      3. Carotid stenosis - history of right CEA - 01/2018 carotid US RICA 1-39% and patent CEA, left 1-39%. Annual checks   - followed by vascular. Has appt next month with repeat US   01/2022 RICA 1-39%, LICA 1-39%   4. Hyperlipidemia - intolerant to statin, has been on low dose pravastatin.  - Dr Rennis Golden had discussed repatha, however patient declinded due to concern about too low  cholesterol - 05/2018 TC 195 HDL 39 TG 73 LDL 141   - taking pravastatin 40mg  MWF - has been resistant to repatha.     10/2019 TC 243 HDL 44 LDL 179. He was off statin at that time but has since restarted - most recent labs with pcp    07/2022 RC 201 TG 59 HDL 38 LDL 151   07/16/22 intolerant to pravastatin, started zetia.      5. HTN - home bp's 140s/50s, later in the day 160s/50s.      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 Son in Social worker is Charisse Klinefelter. 2 sons and daughter are involved in family business Past Medical History:  Diagnosis Date   Arthritis    neck    Atrial fibrillation (HCC)    a. new-onset in 01/2017. Started on Eliquis   CAD (coronary artery disease), native coronary  artery 05/16/2017   LAD 95%>>0 w/ 2.5 x 16 mm Synergy DES, D2 40%, RCA 50%, EF 55-65%, LVEDP mod elevated   Calcification of coronary artery    a. mild, nonobstructive CAD by cath in 2009.   Cancer of skin of chest    Carotid artery occlusion    a. s/p R CEA in 2014   Colon polyps    Hyperlipidemia    takes Pravastatin every other day   Pneumonia ~ 01/2013     Allergies  Allergen Reactions   Lipitor [Atorvastatin Calcium] Other (See Comments)    Muscle pain   Vantin [Cefpodoxime] Rash     Current Outpatient Medications  Medication Sig Dispense Refill   ALPHA LIPOIC ACID PO Take 250 mg by mouth daily.     amLODipine (NORVASC) 2.5 MG tablet Take 1 tablet (2.5 mg total) by mouth daily. 90 tablet 1   apixaban (ELIQUIS) 5 MG TABS tablet TAKE (1) TABLET BY MOUTH TWICE DAILY. 60 tablet 11   clopidogrel (PLAVIX) 75 MG tablet Take 1 tablet (75 mg total) by mouth daily with breakfast. 30 tablet 11   Coenzyme Q10 (COQ10) 400 MG CAPS Take 800 mg by mouth in the morning and at bedtime.      dofetilide (TIKOSYN) 250 MCG capsule Take 1 capsule (250 mcg total) by mouth 2 (two) times daily. 180 capsule 3   ezetimibe (ZETIA) 10 MG tablet Take 1 tablet (10 mg total) by mouth daily. 90 tablet 3   Magnesium 500 MG TABS Take 500 mg by mouth daily.      memantine (NAMENDA) 5 MG tablet Take 5 mg by mouth daily.     methocarbamol (ROBAXIN) 500 MG tablet Take 1 tablet (500 mg total) by mouth 2 (two) times daily. (Patient not taking: Reported on 04/17/2023) 20 tablet 0   Multiple Vitamin (MULTIVITAMIN WITH MINERALS) TABS tablet Take 1 tablet by mouth daily.     nitroGLYCERIN (NITROSTAT) 0.4 MG SL tablet Place 1 tablet (0.4 mg total) under the tongue every 5 (five) minutes as needed for chest pain. 25 tablet 1   pravastatin (PRAVACHOL) 20 MG tablet Take 1 tablet (20 mg total) by mouth every Monday, Wednesday, and Friday. 30 tablet 3   sildenafil (REVATIO) 20 MG tablet Take 2-3 tablets (40-60 mg total) by  mouth as needed (sexual activity). Do not take in combination with nitro SL. (Patient not taking: Reported on 04/17/2023)     zinc gluconate 50 MG tablet Take 50 mg by mouth daily.  No current facility-administered medications for this visit.     Past Surgical History:  Procedure Laterality Date   CARDIAC CATHETERIZATION  06/09/1999   mild CAD (LAD, diagonal, RCA) - Dr. Langston Reusing   CARDIOVERSION N/A 03/25/2017   Procedure: CARDIOVERSION;  Surgeon: Chrystie Nose, MD;  Location: [facility] ENDOSCOPY;  Service: Cardiovascular;  Laterality: N/A;   CARDIOVERSION N/A 09/28/2020   Procedure: CARDIOVERSION;  Surgeon: Chilton Si, MD;  Location: [facility] ENDOSCOPY;  Service: Cardiovascular;  Laterality: N/A;   CAROTID DOPPLER  02/2012   60-79% RICA stenosis, 40-59% LICA stenosis (prior to carotid endarterectomy)   COLONOSCOPY     CORONARY ATHERECTOMY N/A 07/09/2022   Procedure: CORONARY ATHERECTOMY;  Surgeon: Tonny Bollman, MD;  Location: [facility] INVASIVE CV LAB;  Service: Cardiovascular;  Laterality: N/A;   CORONARY PRESSURE/FFR STUDY N/A 07/09/2022   Procedure: INTRAVASCULAR PRESSURE WIRE/FFR STUDY;  Surgeon: Tonny Bollman, MD;  Location: [facility] INVASIVE CV LAB;  Service: Cardiovascular;  Laterality: N/A;   CORONARY STENT INTERVENTION N/A 05/16/2017   Procedure: Coronary Stent Intervention;  Surgeon: Corky Crafts, MD;  Location: [facility] INVASIVE CV LAB;  Service: Cardiovascular;  Laterality: N/A;   CORONARY STENT INTERVENTION N/A 07/09/2022   Procedure: CORONARY STENT INTERVENTION;  Surgeon: Tonny Bollman, MD;  Location: [facility] INVASIVE CV LAB;  Service: Cardiovascular;  Laterality: N/A;   CORONARY ULTRASOUND/IVUS N/A 07/09/2022   Procedure: Intravascular Ultrasound/IVUS;  Surgeon: Tonny Bollman, MD;  Location: [facility] INVASIVE CV LAB;  Service: Cardiovascular;  Laterality: N/A;   ENDARTERECTOMY Right 11/10/2013   Procedure: ENDARTERECTOMY CAROTID;  Surgeon: Fransisco Hertz, MD;  Location: [facility] OR;  Service: Vascular;   Laterality: Right;   INGUINAL HERNIA REPAIR Left    LEFT HEART CATH AND CORONARY ANGIOGRAPHY N/A 05/16/2017   Procedure: Left Heart Cath and Coronary Angiography;  Surgeon: Corky Crafts, MD;  Location: [facility] INVASIVE CV LAB;  Service: Cardiovascular;  Laterality: N/A;   LEFT HEART CATH AND CORONARY ANGIOGRAPHY N/A 07/09/2022   Procedure: LEFT HEART CATH AND CORONARY ANGIOGRAPHY;  Surgeon: Tonny Bollman, MD;  Location: [facility] INVASIVE CV LAB;  Service: Cardiovascular;  Laterality: N/A;   MOLE REMOVAL  1990s   "chest"   NM MYOCAR PERF WALL MOTION  04/2011   bruce myoview - perfusion defect in inferior myocardium (diaphragmatic attenuation), remaining myocardium with normal perfusion, EF 67%   PATCH ANGIOPLASTY Right 11/10/2013   Procedure: PATCH ANGIOPLASTY;  Surgeon: Fransisco Hertz, MD;  Location: [facility] OR;  Service: Vascular;  Laterality: Right;   PILONIDAL CYST DRAINAGE     SHOULDER OPEN ROTATOR CUFF REPAIR Bilateral 1998 - 2001   SKIN CANCER EXCISION  ~ 2015   "chest"   TEMPORARY PACEMAKER N/A 07/09/2022   Procedure: TEMPORARY PACEMAKER;  Surgeon: Tonny Bollman, MD;  Location: [facility] INVASIVE CV LAB;  Service: Cardiovascular;  Laterality: N/A;   TONSILLECTOMY       Allergies  Allergen Reactions   Lipitor [Atorvastatin Calcium] Other (See Comments)    Muscle pain   Vantin [Cefpodoxime] Rash      Family History  Problem Relation Age of Onset   Stroke Father    Hypertension Mother    Prostate cancer Son    Stroke Brother    Cancer Brother        spine     Social History Mr. Criado reports that he quit smoking about 50 years ago. His smoking use included cigarettes. He has a 18.00 pack-year smoking history. He has never used smokeless tobacco. Mr. Bodkins reports current alcohol  use of about 14.0 standard drinks of alcohol per week.   Review of Systems CONSTITUTIONAL: No weight loss, fever, chills, weakness or fatigue.  HEENT: Eyes: No visual loss, blurred vision, double vision  or yellow sclerae.No hearing loss, sneezing, congestion, runny nose or sore throat.  SKIN: No rash or itching.  CARDIOVASCULAR: per hpi RESPIRATORY: No shortness of breath, cough or sputum.  GASTROINTESTINAL: No anorexia, nausea, vomiting or diarrhea. No abdominal pain or blood.  GENITOURINARY: No burning on urination, no polyuria NEUROLOGICAL: No headache, dizziness, syncope, paralysis, ataxia, numbness or tingling in the extremities. No change in bowel or bladder control.  MUSCULOSKELETAL: No muscle, back pain, joint pain or stiffness.  LYMPHATICS: No enlarged nodes. No history of splenectomy.  PSYCHIATRIC: No history of depression or anxiety.  ENDOCRINOLOGIC: No reports of sweating, cold or heat intolerance. No polyuria or polydipsia.  Marland Kitchen   Physical Examination Today's Vitals   04/18/23 1038  BP: 122/78  Pulse: 83  SpO2: 98%  Weight: 191 lb 3.2 oz (86.7 kg)  Height: 6' (1.829 m)   Body mass index is 25.93 kg/m.  Gen: resting comfortably, no acute distress HEENT: no scleral icterus, pupils equal round and reactive, no palptable cervical adenopathy,  CV: irreg, no m/rg, no jvd Resp: Clear to auscultation bilaterally GI: abdomen is soft, non-tender, non-distended, normal bowel sounds, no hepatosplenomegaly MSK: extremities are warm, no edema.  Skin: warm, no rash Neuro:  no focal deficits Psych: appropriate affect   Diagnostic Studies  05/2017 cath   Mid RCA lesion, 50 %stenosed. Ost RCA lesion, 25 %stenosed. Lesion noted in small RV marginal. LM lesion, 25 %stenosed. 2nd Diag lesion, 40 %stenosed. Mid LAD-1 lesion, 20 %stenosed. Mid LAD-2 lesion, 95 %stenosed. A STENT SYNERGY DES 2.5X16 drug eluting stent was successfully placed, postdilated to 3.0 mm. Post intervention, there is a 0% residual stenosis. The left ventricular systolic function is normal. LV end diastolic pressure is moderately elevated. The left ventricular ejection fraction is 55-65% by visual  estimate. There is no aortic valve stenosis.   LAD was the culprit for his unstable angina. He had symptoms during the procedure at rest. These resolved after stent placement. Watch overnight. Continue IV Angiomax for another hour.   Loaded with Plavix today. We'll start 75 mg daily tomorrow. Given aspirin today as well. Would plan for aspirin, Plavix and Eliquis for 30 days. After 30 days, would stop the aspirin and continue Plavix and Eliquis. A synergy drug-eluting stent was used since he is on long-term anticoagulation. If there is a bleeding complication, his antiplatelet therapy could be stopped sooner than usual. Ideally, he would get 6-12 months of Plavix depending on his bleeding risk profile.   Possible discharge tomorrow if he does well tonight and with cardiac rehabilitation tomorrow.     05/2017 echo Study Conclusions   - Left ventricle: The cavity size was normal. Wall thickness was   increased in a pattern of mild LVH. Systolic function was normal.   The estimated ejection fraction was in the range of 60% to 65%.   Wall motion was normal; there were no regional wall motion   abnormalities. The study is not technically sufficient to allow   evaluation of LV diastolic function. - Aortic valve: Mildly calcified annulus. Trileaflet; moderately   thickened leaflets. Valve area (VTI): 2.98 cm^2. Valve area   (Vmax): 2.7 cm^2. Valve area (Vmean): 2.56 cm^2. - Left atrium: The atrium was mildly dilated. - Technically adequate study.     10/2019 nuclear  stress Unable to achieve adequate heart rate response with GXT, Lexiscan utilized. No diagnostic ST segment changes. Small, mild intensity, inferior/inferoseptal defect extending from apex to base that is fixed (actually more prominent at rest) and consistent with soft tissue attenuation. No reversible defects to indicate ischemia. This is a low risk study. Nuclear stress EF: 69%.     07/2022 monitor 7 day monitor   Frequent  isolated PACs. Rare couplets and triplets. Rare runs of SVT longest 7  beats   Rare ventricular ectopy in the form of isolated PVCs   Min HR 32, Max HR 80, avg HR 54. Sinus brady to low 30s at times in early and late afternoon. Nocturnal pauses up to 3.2 seconds.   No patient reported symptoms     Assessment and Plan   1.Low blood pressure - unclear etiology. Home bp's 70s/50s per his report, at time of ER eval bp's were normal and remain normal today. No specific history to support an etiology, unclear true accuracy of home bp especially given asmptomatic and so transient -will stop norvasc for now and monitor bp's   2. Afib/acquired thrombophilia - on dofetilide, some evidence of conduction disease would avoid av nodal agents - EKG shows back in afib, plan for 7 day montor to assess burden and sufficency of current dofetilide. Pending results likely get him back in with Dr Ladona Ridgel. Unclear if persistent afib if consider DCCV, if paroxysmal but high burden if alternative therapy would be indicated.         Antoine Poche, M.D.

## 2023-04-29 DIAGNOSIS — I4891 Unspecified atrial fibrillation: Secondary | ICD-10-CM | POA: Diagnosis not present

## 2023-06-10 DIAGNOSIS — H04123 Dry eye syndrome of bilateral lacrimal glands: Secondary | ICD-10-CM | POA: Diagnosis not present

## 2023-06-11 DIAGNOSIS — I2510 Atherosclerotic heart disease of native coronary artery without angina pectoris: Secondary | ICD-10-CM | POA: Diagnosis not present

## 2023-06-11 DIAGNOSIS — Z79899 Other long term (current) drug therapy: Secondary | ICD-10-CM | POA: Diagnosis not present

## 2023-06-11 DIAGNOSIS — R413 Other amnesia: Secondary | ICD-10-CM | POA: Diagnosis not present

## 2023-06-11 DIAGNOSIS — I4891 Unspecified atrial fibrillation: Secondary | ICD-10-CM | POA: Diagnosis not present

## 2023-06-12 ENCOUNTER — Telehealth: Payer: Self-pay | Admitting: *Deleted

## 2023-06-12 NOTE — Telephone Encounter (Signed)
Returned call to pt for an update on visit with Dr. Ouida Sills. No answer. No voicemail.

## 2023-06-12 NOTE — Telephone Encounter (Signed)
Ok have him update [place] after his evaluation with Dr Dietrich Pates MD

## 2023-06-12 NOTE — Telephone Encounter (Signed)
Pt walked in office and states that his blood pressure is down and his heartrate is elevated. Pt report that his BP is below 100 and his pulse is at 90. He stats that this is new for him. Pt stats that he checks his BP every morning and noticed it today. Informed pt that I would check his BP and he stated that he would be seen by Dr. Ouida Sills today.

## 2023-06-17 NOTE — Telephone Encounter (Signed)
Returned call to pt for update on PCP visit. Pt states that his appointment is today with Dr. Ouida Sills and that he will update [place] on the outcome. Today his BP was 117/54 Hr. 71.

## 2023-06-18 DIAGNOSIS — I4821 Permanent atrial fibrillation: Secondary | ICD-10-CM | POA: Diagnosis not present

## 2023-06-18 DIAGNOSIS — E785 Hyperlipidemia, unspecified: Secondary | ICD-10-CM | POA: Diagnosis not present

## 2023-06-18 DIAGNOSIS — R413 Other amnesia: Secondary | ICD-10-CM | POA: Diagnosis not present

## 2023-06-18 DIAGNOSIS — Z0001 Encounter for general adult medical examination with abnormal findings: Secondary | ICD-10-CM | POA: Diagnosis not present

## 2023-06-18 DIAGNOSIS — I2510 Atherosclerotic heart disease of native coronary artery without angina pectoris: Secondary | ICD-10-CM | POA: Diagnosis not present

## 2023-06-18 NOTE — Telephone Encounter (Signed)
Called to get an update on PCP visit from pt. Wife states that pt is not home and to try again on tomorrow morning.

## 2023-06-20 NOTE — Telephone Encounter (Signed)
Called pt for update on PCP visit. No answer no voicemail.

## 2023-08-01 ENCOUNTER — Other Ambulatory Visit: Payer: Self-pay

## 2023-08-01 ENCOUNTER — Encounter (HOSPITAL_COMMUNITY): Payer: Self-pay | Admitting: Emergency Medicine

## 2023-08-01 ENCOUNTER — Emergency Department (HOSPITAL_COMMUNITY): Payer: Medicare HMO

## 2023-08-01 ENCOUNTER — Emergency Department (HOSPITAL_COMMUNITY)
Admission: EM | Admit: 2023-08-01 | Discharge: 2023-08-01 | Disposition: A | Discharge status: Home / Self Care | Payer: Medicare HMO | Attending: Emergency Medicine | Admitting: Emergency Medicine

## 2023-08-01 DIAGNOSIS — R000 Tachycardia, unspecified: Secondary | ICD-10-CM | POA: Diagnosis not present

## 2023-08-01 DIAGNOSIS — I482 Chronic atrial fibrillation, unspecified: Secondary | ICD-10-CM

## 2023-08-01 DIAGNOSIS — I4891 Unspecified atrial fibrillation: Secondary | ICD-10-CM | POA: Insufficient documentation

## 2023-08-01 DIAGNOSIS — Z7901 Long term (current) use of anticoagulants: Secondary | ICD-10-CM | POA: Insufficient documentation

## 2023-08-01 LAB — BASIC METABOLIC PANEL
Anion gap: 7 (ref 5–15)
BUN: 15 mg/dL (ref 8–23)
CO2: 27 mmol/L (ref 22–32)
Calcium: 8.8 mg/dL — ABNORMAL LOW (ref 8.9–10.3)
Chloride: 101 mmol/L (ref 98–111)
Creatinine, Ser: 1.06 mg/dL (ref 0.61–1.24)
GFR, Estimated: >60 mL/min (ref >60)
Glucose, Bld: 89 mg/dL (ref 70–99)
Potassium: 4.2 mmol/L (ref 3.5–5.1)
Sodium: 135 mmol/L (ref 135–145)

## 2023-08-01 LAB — CBC
HCT: 41 % (ref 39.0–52.0)
Hemoglobin: 14.1 g/dL (ref 13.0–17.0)
MCH: 31.3 pg (ref 26.0–34.0)
MCHC: 34.4 g/dL (ref 30.0–36.0)
MCV: 91.1 fL (ref 80.0–100.0)
Platelets: 209 10*3/uL (ref 150–400)
RBC: 4.5 MIL/uL (ref 4.22–5.81)
RDW: 12.6 % (ref 11.5–15.5)
WBC: 5 10*3/uL (ref 4.0–10.5)
nRBC: 0 % (ref 0.0–0.2)

## 2023-08-01 NOTE — ED Triage Notes (Signed)
Pt states he checks his hr qam and at bedtime. Seen it was 102 this am and came to ED. Pt ambulatory, a/o. Color wnl. Non diaphoretic and asymptomatic. Pt denies any symptoms and states "I wouldn't have known it if I didn't check it". HR irregular radially.

## 2023-08-01 NOTE — Discharge Instructions (Signed)
It was our pleasure to provide your ER care today - we hope that you feel better.  Your ER tests/lab tests look good or normal, and your heart rate in the ER is normal.   Please note that with your atrial fibrillation, occasionally your heart rate may be in the 100-120 range if/when you do a spot check of your heart rate, and that is ok.  If your heart rate persistently stays > 120, then you should be checked by your doctor or here.  Return to ER if worse, new symptoms, chest pain, trouble breathing, fainting, or other concern.

## 2023-08-01 NOTE — ED Provider Notes (Signed)
[place] EMERGENCY DEPARTMENT AT [facility] Provider Note   CSN: [id number] Arrival date & time: 08/01/23  1006     History  Chief Complaint  Patient presents with   Tachycardia    Sean Miranda is a 89 y.o. male.  Pt with hx afib, indicates he routinely checks his heart rate 2x/day, and today it was 102. He indicates he feels fine otherwise, asymptomatic. No chest pain or sob. No faintness or dizziness. States compliant w home meds, no recent change.   The history is provided by the patient and medical records.       Home Medications Prior to Admission medications   Medication Sig Start Date End Date Taking? Authorizing Provider  ALPHA LIPOIC ACID PO Take 250 mg by mouth daily.    [provider]  apixaban (ELIQUIS) 5 MG TABS tablet TAKE (1) TABLET BY MOUTH TWICE DAILY. 04/05/23   Antoine Poche, MD  clopidogrel (PLAVIX) 75 MG tablet Take 1 tablet (75 mg total) by mouth daily with breakfast. 07/11/22   Duke, Roe Rutherford, PA  Coenzyme Q10 (COQ10) 400 MG CAPS Take 800 mg by mouth in the morning and at bedtime.     [provider]  dofetilide (TIKOSYN) 250 MCG capsule Take 1 capsule (250 mcg total) by mouth 2 (two) times daily. 12/17/22   Marinus Maw, MD  ezetimibe (ZETIA) 10 MG tablet Take 1 tablet (10 mg total) by mouth daily. 07/16/22   Sharlene Dory, PA-C  Magnesium 500 MG TABS Take 500 mg by mouth daily.     [provider]  memantine (NAMENDA) 5 MG tablet Take 5 mg by mouth daily. 08/17/22   [provider]  methocarbamol (ROBAXIN) 500 MG tablet Take 1 tablet (500 mg total) by mouth 2 (two) times daily. Patient not taking: Reported on 04/18/2023 10/28/22   Burgess Amor, PA-C  Multiple Vitamin (MULTIVITAMIN WITH MINERALS) TABS tablet Take 1 tablet by mouth daily.    [provider]  nitroGLYCERIN (NITROSTAT) 0.4 MG SL tablet Place 1 tablet (0.4 mg total) under the tongue every 5 (five) minutes as needed for chest  pain. 07/11/22   Antoine Poche, MD  pravastatin (PRAVACHOL) 20 MG tablet Take 1 tablet (20 mg total) by mouth every Monday, Wednesday, and Friday. 10/22/22 07/29/23  Antoine Poche, MD  sildenafil (REVATIO) 20 MG tablet Take 2-3 tablets (40-60 mg total) by mouth as needed (sexual activity). Do not take in combination with nitro SL. 07/10/22   Duke, Roe Rutherford, PA  zinc gluconate 50 MG tablet Take 50 mg by mouth daily.    [provider]      Allergies    Lipitor [atorvastatin calcium] and Vantin [cefpodoxime]    Review of Systems   Review of Systems  Constitutional:  Negative for chills and fever.  Respiratory:  Negative for shortness of breath.   Cardiovascular:  Negative for chest pain and palpitations.  Gastrointestinal:  Negative for abdominal pain, diarrhea and vomiting.  Genitourinary:  Negative for dysuria.  Neurological:  Negative for light-headedness.    Physical Exam Updated Vital Signs BP (!) 107/53   Pulse 81   Temp 97.8 F (36.6 C) (Oral)   Resp 19   SpO2 93%  Physical Exam Vitals and nursing note reviewed.  Constitutional:      Appearance: Normal appearance. He is well-developed.  HENT:     Head: Atraumatic.     Nose: Nose normal.     Mouth/Throat:  Mouth: Mucous membranes are moist.  Eyes:     General: No scleral icterus.    Conjunctiva/sclera: Conjunctivae normal.  Neck:     Trachea: No tracheal deviation.  Cardiovascular:     Rate and Rhythm: Normal rate. Rhythm irregular.     Pulses: Normal pulses.     Heart sounds: Normal heart sounds.  Pulmonary:     Effort: Pulmonary effort is normal. No accessory muscle usage or respiratory distress.     Breath sounds: Normal breath sounds.  Abdominal:     General: There is no distension.     Tenderness: There is no abdominal tenderness.  Musculoskeletal:        General: No swelling.     Cervical back: Neck supple.  Skin:    General: Skin is warm and dry.     Findings: No rash.   Neurological:     Mental Status: He is alert.     Comments: Alert, speech clear.   Psychiatric:        Mood and Affect: Mood normal.     ED Results / Procedures / Treatments   Labs (all labs ordered are listed, but only abnormal results are displayed) Results for orders placed or performed during the hospital encounter of 08/01/23  Basic metabolic panel  Result Value Ref Range   Sodium 135 135 - 145 mmol/L   Potassium 4.2 3.5 - 5.1 mmol/L   Chloride 101 98 - 111 mmol/L   CO2 27 22 - 32 mmol/L   Glucose, Bld 89 70 - 99 mg/dL   BUN 15 8 - 23 mg/dL   Creatinine, Ser 0.45 0.61 - 1.24 mg/dL   Calcium 8.8 (L) 8.9 - 10.3 mg/dL   GFR, Estimated >40 >98 mL/min   Anion gap 7 5 - 15  CBC  Result Value Ref Range   WBC 5.0 4.0 - 10.5 K/uL   RBC 4.50 4.22 - 5.81 MIL/uL   Hemoglobin 14.1 13.0 - 17.0 g/dL   HCT 11.9 14.7 - 82.9 %   MCV 91.1 80.0 - 100.0 fL   MCH 31.3 26.0 - 34.0 pg   MCHC 34.4 30.0 - 36.0 g/dL   RDW 56.2 13.0 - 86.5 %   Platelets 209 150 - 400 K/uL   nRBC 0.0 0.0 - 0.2 %     EKG EKG Interpretation Date/Time:  Thursday August 01 2023 10:22:55 EDT Ventricular Rate:  95 PR Interval:    QRS Duration:  149 QT Interval:  400 QTC Calculation: 503 R Axis:   95  Text Interpretation: Atrial fibrillation RBBB and LPFB Confirmed by Cathren Laine ([id number]) on 08/01/2023 10:46:15 AM  Radiology DG Chest Port 1 View  Result Date: 08/01/2023 CLINICAL DATA:  Tachycardia EXAM: PORTABLE CHEST 1 VIEW COMPARISON:  Chest radiograph dated 01/28/2023 FINDINGS: Normal lung volumes. No focal consolidations. No pleural effusion or pneumothorax. The heart size and mediastinal contours are within normal limits. No acute osseous abnormality. Left humeral bone anchors. IMPRESSION: No active disease. Electronically Signed   By: Agustin Cree M.D.   On: 08/01/2023 11:31    Procedures Procedures    Medications Ordered in ED Medications - No data to display  ED Course/ Medical Decision Making/  A&P                                 Medical Decision Making Problems Addressed: Atrial fibrillation, chronic (HCC): chronic illness or injury with exacerbation, progression,  or side effects of treatment that poses a threat to life or bodily functions Rapid atrial fibrillation [facility]): acute illness or injury with systemic symptoms  Amount and/or Complexity of Data Reviewed Independent Historian: spouse    Details: hx External Data Reviewed: notes. Labs: ordered. Decision-making details documented in ED Course. Radiology: ordered and independent interpretation performed. Decision-making details documented in ED Course. ECG/medicine tests: ordered and independent interpretation performed. Decision-making details documented in ED Course.   Labs sent. Imaging ordered by staff.  Reviewed nursing notes and prior charts for additional history.   Labs reviewed/interpreted by me - wbc and hgb normal.  Xrays reviewed/interpreted by me - no pna.   Cardiac monitoring - Pt w chronic afib, hr in ED generally in 80-90 range, afib.   Pt asymptomatic and appears stable for d/c.           Final Clinical Impression(s) / ED Diagnoses Final diagnoses:  None    Rx / DC Orders ED Discharge Orders     None         Cathren Laine, MD 08/01/23 1139

## 2023-08-16 ENCOUNTER — Encounter: Payer: Self-pay | Admitting: Cardiology

## 2023-08-16 ENCOUNTER — Ambulatory Visit: Payer: Medicare HMO | Attending: Cardiology | Admitting: Cardiology

## 2023-08-16 VITALS — BP 140/70 | HR 57 | Wt 188.0 lb

## 2023-08-16 DIAGNOSIS — I4891 Unspecified atrial fibrillation: Secondary | ICD-10-CM

## 2023-08-16 DIAGNOSIS — D6869 Other thrombophilia: Secondary | ICD-10-CM | POA: Diagnosis not present

## 2023-08-16 DIAGNOSIS — E782 Mixed hyperlipidemia: Secondary | ICD-10-CM | POA: Diagnosis not present

## 2023-08-16 DIAGNOSIS — I2510 Atherosclerotic heart disease of native coronary artery without angina pectoris: Secondary | ICD-10-CM | POA: Diagnosis not present

## 2023-08-16 NOTE — Patient Instructions (Signed)
Medication Instructions:  Your physician has recommended you make the following change in your medication:   -Stop Plavix  *If you need a refill on your cardiac medications before your next appointment, please call your pharmacy*   Lab Work: None If you have labs (blood work) drawn today and your tests are completely normal, you will receive your results only by: MyChart Message (if you have MyChart) OR A paper copy in the mail If you have any lab test that is abnormal or we need to change your treatment, we will call you to review the results.   Testing/Procedures: None   Follow-Up: At [facility], you and your health needs are our priority.  As part of our continuing mission to provide you with exceptional heart care, we have created designated Provider Care Teams.  These Care Teams include your primary Cardiologist (physician) and Advanced Practice Providers (APPs -  Physician Assistants and Nurse Practitioners) who all work together to provide you with the care you need, when you need it.  We recommend signing up for the patient portal called "MyChart".  Sign up information is provided on this After Visit Summary.  MyChart is used to connect with patients for Virtual Visits (Telemedicine).  Patients are able to view lab/test results, encounter notes, upcoming appointments, etc.  Non-urgent messages can be sent to your provider as well.   To learn more about what you can do with MyChart, go to [email or web address].    Your next appointment:   6 month(s)  Provider:   You may see Dina Rich, MD or one of the following Advanced Practice Providers on your designated Care Team:   Randall An, PA-C  Jacolyn Reedy, [place]     Other Instructions

## 2023-08-16 NOTE — Progress Notes (Signed)
Clinical Summary Mr. Cronan is a 89 y.o.male seen today for follow up of the following medical problems.      1.Low bp - ER visit yesterday with home bp's in the 70s.  - ER bp 119/66 - Cr 1 BUN 14 WBC 6.8 Hgb 13.9 Lactic acid 0.8   - low energy several x 2-3 months - no palpitaitons. Some SOB x 1 year - checked home vitals, reports 79/54 and HR 100.  - no other symptoms - 6-8 glasses of water per day.     - home bps typically 100s-140s/50-60s - no recurrent low bp's   2. Afib - appears diagnosed around 01/2017 during admission. Started on eliquis and lopressor 12.5mg  bid. Due to continued dyspnea attempts were made to get him back into NSR, including multiple cardioversions and starting flecanide. Had tolerated lopressor at the time but stopped taking due to HRs in 40s or 50s, had been on 12.5mg  bid at the time.    - was on flecanide previously, discontued after diagnosed with CAD  We tried lopressor 12.5mg  bid last visit but caused fatigue and he stopped taking. Heart rates checked multiple times a day, usually 50s-60s.        - followed by afib clinic/EP - started on dofetilide 09/2020 with DCCV at that time - recurrent afib noted at 10/2020 f/u. Repeat DCCV was planned 11/2020 but cancelled as pt was back in SR. - at last EP appt 12/3 plan was to continue dofetilide - 07/2022 monitor no afib     - home HRs 40, highest rates mid 60s typically. Last few days home HRs 80s to 90s.  - denies specific palpitations. Has had some generalized fatigue last few months     04/2023 7 day monitor: frequent PACs, 16 runs SVT longest 17.8 seconds. Rare PVCs, Afib burden <1% rates 70-103, longest episode 33 min Overall patient had a min HR of 36 bpm, max HR of 122 bpm, and avg HR of 55 bpm.    ER visit 07/2023 notieced HRs were low 100s at home, asymptomatic EKG showed afib rate 95 - no bleeding on eliquis.     3. CAD - admit 05/2017 with chest pain - 05/2017 cath as reported  below, received a DES to LAD - 05/2017 echo LVEF 60-65%, no WMAs   - seen in ER 10/2019 with chest pain - trop neg x 1. EKG afib without acute ischemic changes. CXR no acute process    10/2019 nuclear stress no ischemia - no significant symptoms.      - Admit 07/2022 with unstable angina. 07/2022 cath: LM 25%, diatal LM/ostial LAD 70% borderline RFR, prox LAD 50%, ostial RCA 90%, mid RCA 50%, acute marginal 75%. DES to RCA    -as far as antianginals avoid beta blockers due to low HRs. On dofetilide would avoid ranexa. Soft bp's at times but would try starting back his norvasc 2.5mg  daily. Uses sildenafil prn would avoid imdur.     08/2022 ER visit with chest pain, negative evaluation for ACS       10/06/22 visit with chest pain to ER - negative workup for ACS - pain developed in bed. Left sided pain pressure. 5/10 in severity. No other associated symptoms. Worst with deep breathing. Pain last 6-8 hours constant - no recurrent symptoms - compliant with meds     - no chest pains. Has completed 1 year of plavix since stent     4. Carotid stenosis -  history of right CEA - 01/2018 carotid US RICA 1-39% and patent CEA, left 1-39%. Annual checks   - followed by vascular. Has appt next month with repeat US   01/2022 RICA 1-39%, LICA 1-39% - f/u 02/2024 per vacular with [place] at that time    5. Hyperlipidemia - intolerant to statin, has been on low dose pravastatin.  - Dr Rennis Golden had discussed repatha, however patient declinded due to concern about too low cholesterol - 05/2018 TC 195 HDL 39 TG 73 LDL 141   - taking pravastatin 40mg  MWF - has been resistant to repatha.     10/2019 TC 243 HDL 44 LDL 179. He was off statin at that time but has since restarted - most recent labs with pcp    07/2022 RC 201 TG 59 HDL 38 LDL 151   07/16/22 intolerant to pravastatin, started zetia.       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 Son in Social worker is Charisse Klinefelter. 2 sons and daughter are involved in family business Past Medical History:  Diagnosis Date   Arthritis    neck    Atrial fibrillation (HCC)    a. new-onset in 01/2017. Started on Eliquis   CAD (coronary artery disease), native coronary artery 05/16/2017   LAD 95%>>0 w/ 2.5 x 16 mm Synergy DES, D2 40%, RCA 50%, EF 55-65%, LVEDP mod elevated   Calcification of coronary artery    a. mild, nonobstructive CAD by cath in 2009.   Cancer of skin of chest    Carotid artery occlusion    a. s/p R CEA in 2014   Colon polyps    Hyperlipidemia    takes Pravastatin every other day   Pneumonia ~ 01/2013     Allergies  Allergen Reactions   Lipitor [Atorvastatin Calcium] Other (See Comments)    Muscle pain   Vantin [Cefpodoxime] Rash     Current Outpatient Medications  Medication Sig Dispense Refill   ALPHA LIPOIC ACID PO Take 250 mg by mouth daily.     apixaban (ELIQUIS) 5 MG TABS tablet TAKE (1) TABLET BY MOUTH TWICE DAILY. 60 tablet 11   clopidogrel (PLAVIX) 75 MG tablet Take 1 tablet (75 mg total) by mouth daily with breakfast. 30 tablet 11   Coenzyme Q10 (COQ10) 400 MG CAPS Take 800 mg by mouth in the morning and at bedtime.      dofetilide (TIKOSYN) 250 MCG capsule Take 1 capsule (250 mcg total) by mouth 2 (two) times daily. 180 capsule 3   ezetimibe (ZETIA) 10 MG tablet Take 1 tablet (10 mg total) by mouth daily. 90 tablet 3   Magnesium 500 MG TABS Take 500 mg by mouth daily.      memantine (NAMENDA) 5 MG tablet Take 5 mg by mouth daily.     methocarbamol (ROBAXIN) 500 MG tablet Take 1 tablet (500 mg total) by mouth 2 (two) times daily. (Patient not taking: Reported on 04/18/2023) 20 tablet 0   Multiple Vitamin (MULTIVITAMIN WITH MINERALS) TABS tablet Take 1 tablet by mouth daily.     nitroGLYCERIN (NITROSTAT) 0.4 MG SL tablet Place 1 tablet (0.4 mg total) under the tongue every 5 (five) minutes as needed for chest pain. 25 tablet  1   pravastatin (PRAVACHOL) 20 MG tablet Take 1 tablet (20 mg total) by mouth every Monday, Wednesday, and Friday. 30 tablet 3   sildenafil (REVATIO)  20 MG tablet Take 2-3 tablets (40-60 mg total) by mouth as needed (sexual activity). Do not take in combination with nitro SL.     zinc gluconate 50 MG tablet Take 50 mg by mouth daily.     No current facility-administered medications for this visit.     Past Surgical History:  Procedure Laterality Date   CARDIAC CATHETERIZATION  06/09/1999   mild CAD (LAD, diagonal, RCA) - Dr. Langston Reusing   CARDIOVERSION N/A 03/25/2017   Procedure: CARDIOVERSION;  Surgeon: Chrystie Nose, MD;  [facility] ENDOSCOPY;  Service: Cardiovascular;  Laterality: N/A;   CARDIOVERSION N/A 09/28/2020   Procedure: CARDIOVERSION;  Surgeon: Chilton Si, MD;  Location: [facility] ENDOSCOPY;  Service: Cardiovascular;  Laterality: N/A;   CAROTID DOPPLER  02/2012   60-79% RICA stenosis, 40-59% LICA stenosis (prior to carotid endarterectomy)   COLONOSCOPY     CORONARY ATHERECTOMY N/A 07/09/2022   Procedure: CORONARY ATHERECTOMY;  Surgeon: Tonny Bollman, MD;  Location: [facility] INVASIVE CV LAB;  Service: Cardiovascular;  Laterality: N/A;   CORONARY PRESSURE/FFR STUDY N/A 07/09/2022   Procedure: INTRAVASCULAR PRESSURE WIRE/FFR STUDY;  Surgeon: Tonny Bollman, MD;  Location: [facility] INVASIVE CV LAB;  Service: Cardiovascular;  Laterality: N/A;   CORONARY STENT INTERVENTION N/A 05/16/2017   Procedure: Coronary Stent Intervention;  Surgeon: Corky Crafts, MD;  Location: [facility] INVASIVE CV LAB;  Service: Cardiovascular;  Laterality: N/A;   CORONARY STENT INTERVENTION N/A 07/09/2022   Procedure: CORONARY STENT INTERVENTION;  Surgeon: Tonny Bollman, MD;  Location: [facility] INVASIVE CV LAB;  Service: Cardiovascular;  Laterality: N/A;   CORONARY ULTRASOUND/IVUS N/A 07/09/2022   Procedure: Intravascular Ultrasound/IVUS;  Surgeon: Tonny Bollman, MD;  Location: [facility] INVASIVE CV LAB;  Service: Cardiovascular;   Laterality: N/A;   ENDARTERECTOMY Right 11/10/2013   Procedure: ENDARTERECTOMY CAROTID;  Surgeon: Fransisco Hertz, MD;  Location: [facility] OR;  Service: Vascular;  Laterality: Right;   INGUINAL HERNIA REPAIR Left    LEFT HEART CATH AND CORONARY ANGIOGRAPHY N/A 05/16/2017   Procedure: Left Heart Cath and Coronary Angiography;  Surgeon: Corky Crafts, MD;  Location: [facility] INVASIVE CV LAB;  Service: Cardiovascular;  Laterality: N/A;   LEFT HEART CATH AND CORONARY ANGIOGRAPHY N/A 07/09/2022   Procedure: LEFT HEART CATH AND CORONARY ANGIOGRAPHY;  Surgeon: Tonny Bollman, MD;  Location: [facility] INVASIVE CV LAB;  Service: Cardiovascular;  Laterality: N/A;   MOLE REMOVAL  1990s   "chest"   NM MYOCAR PERF WALL MOTION  04/2011   bruce myoview - perfusion defect in inferior myocardium (diaphragmatic attenuation), remaining myocardium with normal perfusion, EF 67%   PATCH ANGIOPLASTY Right 11/10/2013   Procedure: PATCH ANGIOPLASTY;  Surgeon: Fransisco Hertz, MD;  Location: [facility] OR;  Service: Vascular;  Laterality: Right;   PILONIDAL CYST DRAINAGE     SHOULDER OPEN ROTATOR CUFF REPAIR Bilateral 1998 - 2001   SKIN CANCER EXCISION  ~ 2015   "chest"   TEMPORARY PACEMAKER N/A 07/09/2022   Procedure: TEMPORARY PACEMAKER;  Surgeon: Tonny Bollman, MD;  Location: [facility] INVASIVE CV LAB;  Service: Cardiovascular;  Laterality: N/A;   TONSILLECTOMY       Allergies  Allergen Reactions   Lipitor [Atorvastatin Calcium] Other (See Comments)    Muscle pain   Vantin [Cefpodoxime] Rash      Family History  Problem Relation Age of Onset   Stroke Father    Hypertension Mother    Prostate cancer Son    Stroke Brother    Cancer Brother  spine     Social History Mr. Moers reports that he quit smoking about 50 years ago. His smoking use included cigarettes. He started smoking about 68 years ago. He has a 18 pack-year smoking history. He has never used smokeless tobacco. Mr. Mudd reports current alcohol use of about 14.0  standard drinks of alcohol per week.   Review of Systems CONSTITUTIONAL: No weight loss, fever, chills, weakness or fatigue.  HEENT: Eyes: No visual loss, blurred vision, double vision or yellow sclerae.No hearing loss, sneezing, congestion, runny nose or sore throat.  SKIN: No rash or itching.  CARDIOVASCULAR: per hpi RESPIRATORY: No shortness of breath, cough or sputum.  GASTROINTESTINAL: No anorexia, nausea, vomiting or diarrhea. No abdominal pain or blood.  GENITOURINARY: No burning on urination, no polyuria NEUROLOGICAL: No headache, dizziness, syncope, paralysis, ataxia, numbness or tingling in the extremities. No change in bowel or bladder control.  MUSCULOSKELETAL: No muscle, back pain, joint pain or stiffness.  LYMPHATICS: No enlarged nodes. No history of splenectomy.  PSYCHIATRIC: No history of depression or anxiety.  ENDOCRINOLOGIC: No reports of sweating, cold or heat intolerance. No polyuria or polydipsia.  Marland Kitchen   Physical Examination Today's Vitals   08/16/23 1311  BP: (!) 166/76  Pulse: (!) 57  SpO2: 98%  Weight: 188 lb (85.3 kg)   Body mass index is 25.5 kg/m.  Gen: resting comfortably, no acute distress HEENT: no scleral icterus, pupils equal round and reactive, no palptable cervical adenopathy,  CV:RRR, no mrg no jvd Resp: Clear to auscultation bilaterally GI: abdomen is soft, non-tender, non-distended, normal bowel sounds, no hepatosplenomegaly MSK: extremities are warm, no edema.  Skin: warm, no rash Neuro:  no focal deficits Psych: appropriate affect   Diagnostic Studies  05/2017 cath   Mid RCA lesion, 50 %stenosed. Ost RCA lesion, 25 %stenosed. Lesion noted in small RV marginal. LM lesion, 25 %stenosed. 2nd Diag lesion, 40 %stenosed. Mid LAD-1 lesion, 20 %stenosed. Mid LAD-2 lesion, 95 %stenosed. A STENT SYNERGY DES 2.5X16 drug eluting stent was successfully placed, postdilated to 3.0 mm. Post intervention, there is a 0% residual stenosis. The  left ventricular systolic function is normal. LV end diastolic pressure is moderately elevated. The left ventricular ejection fraction is 55-65% by visual estimate. There is no aortic valve stenosis.   LAD was the culprit for his unstable angina. He had symptoms during the procedure at rest. These resolved after stent placement. Watch overnight. Continue IV Angiomax for another hour.   Loaded with Plavix today. We'll start 75 mg daily tomorrow. Given aspirin today as well. Would plan for aspirin, Plavix and Eliquis for 30 days. After 30 days, would stop the aspirin and continue Plavix and Eliquis. A synergy drug-eluting stent was used since he is on long-term anticoagulation. If there is a bleeding complication, his antiplatelet therapy could be stopped sooner than usual. Ideally, he would get 6-12 months of Plavix depending on his bleeding risk profile.   Possible discharge tomorrow if he does well tonight and with cardiac rehabilitation tomorrow.     05/2017 echo Study Conclusions   - Left ventricle: The cavity size was normal. Wall thickness was   increased in a pattern of mild LVH. Systolic function was normal.   The estimated ejection fraction was in the range of 60% to 65%.   Wall motion was normal; there were no regional wall motion   abnormalities. The study is not technically sufficient to allow   evaluation of LV diastolic function. - Aortic valve: Mildly calcified  annulus. Trileaflet; moderately   thickened leaflets. Valve area (VTI): 2.98 cm^2. Valve area   (Vmax): 2.7 cm^2. Valve area (Vmean): 2.56 cm^2. - Left atrium: The atrium was mildly dilated. - Technically adequate study.     10/2019 nuclear stress Unable to achieve adequate heart rate response with GXT, Lexiscan utilized. No diagnostic ST segment changes. Small, mild intensity, inferior/inferoseptal defect extending from apex to base that is fixed (actually more prominent at rest) and consistent with soft tissue  attenuation. No reversible defects to indicate ischemia. This is a low risk study. Nuclear stress EF: 69%.     07/2022 monitor 7 day monitor   Frequent isolated PACs. Rare couplets and triplets. Rare runs of SVT longest 7  beats   Rare ventricular ectopy in the form of isolated PVCs   Min HR 32, Max HR 80, avg HR 54. Sinus brady to low 30s at times in early and late afternoon. Nocturnal pauses up to 3.2 seconds.   No patient reported symptoms     04/2023 7 day monitor   Frequent supraventricular ectopy in the form of isolated PACs (15.9%), rare couplets and triplets. 16 runs of SVT longest 17.8 seconds.   Rare ventricular ectopy in the form of PVCs   No symptoms reported   Afib episodes <1% burden, rates 70-103 avg 88. Longest episode 33 minutes  Assessment and Plan     1. Afib/acquired thrombophilia - on dofetilide, some evidence of conduction disease would avoid av nodal agents - recent monitor low burden of afib <1%, noted in afib during recent ER visit with controlled rates, EKG today back in SR - overall dofetilide limiting recurrences, his recurrences are asymptomatic and rate controlled - continue current meds including eliquis for stroke prevention  2. CAD - 1 year since stent, can d/c plavix.  - no significant symptoms  3. HLD - not at goal, limited tolerante to staitns. Has not been interested in repatha -continue current meds   F/u 6 months           Antoine Poche, M.D.

## 2023-09-05 DIAGNOSIS — I129 Hypertensive chronic kidney disease with stage 1 through stage 4 chronic kidney disease, or unspecified chronic kidney disease: Secondary | ICD-10-CM | POA: Diagnosis not present

## 2023-09-05 DIAGNOSIS — Z7901 Long term (current) use of anticoagulants: Secondary | ICD-10-CM | POA: Diagnosis not present

## 2023-09-05 DIAGNOSIS — G309 Alzheimer's disease, unspecified: Secondary | ICD-10-CM | POA: Diagnosis not present

## 2023-09-05 DIAGNOSIS — N529 Male erectile dysfunction, unspecified: Secondary | ICD-10-CM | POA: Diagnosis not present

## 2023-09-05 DIAGNOSIS — F0280 Dementia in other diseases classified elsewhere without behavioral disturbance: Secondary | ICD-10-CM | POA: Diagnosis not present

## 2023-09-05 DIAGNOSIS — Z008 Encounter for other general examination: Secondary | ICD-10-CM | POA: Diagnosis not present

## 2023-09-05 DIAGNOSIS — I25119 Atherosclerotic heart disease of native coronary artery with unspecified angina pectoris: Secondary | ICD-10-CM | POA: Diagnosis not present

## 2023-09-05 DIAGNOSIS — E785 Hyperlipidemia, unspecified: Secondary | ICD-10-CM | POA: Diagnosis not present

## 2023-09-05 DIAGNOSIS — N182 Chronic kidney disease, stage 2 (mild): Secondary | ICD-10-CM | POA: Diagnosis not present

## 2023-09-05 DIAGNOSIS — Z85828 Personal history of other malignant neoplasm of skin: Secondary | ICD-10-CM | POA: Diagnosis not present

## 2023-09-05 DIAGNOSIS — D6869 Other thrombophilia: Secondary | ICD-10-CM | POA: Diagnosis not present

## 2023-09-05 DIAGNOSIS — Z87891 Personal history of nicotine dependence: Secondary | ICD-10-CM | POA: Diagnosis not present

## 2023-09-05 DIAGNOSIS — Z95820 Peripheral vascular angioplasty status with implants and grafts: Secondary | ICD-10-CM | POA: Diagnosis not present

## 2023-10-15 DIAGNOSIS — I2510 Atherosclerotic heart disease of native coronary artery without angina pectoris: Secondary | ICD-10-CM | POA: Diagnosis not present

## 2023-10-15 DIAGNOSIS — K4090 Unilateral inguinal hernia, without obstruction or gangrene, not specified as recurrent: Secondary | ICD-10-CM | POA: Diagnosis not present

## 2023-10-15 DIAGNOSIS — F0393 Unspecified dementia, unspecified severity, with mood disturbance: Secondary | ICD-10-CM | POA: Diagnosis not present

## 2023-11-22 ENCOUNTER — Other Ambulatory Visit: Payer: Self-pay | Admitting: Cardiology

## 2023-12-15 ENCOUNTER — Other Ambulatory Visit: Payer: Self-pay | Admitting: Internal Medicine

## 2023-12-25 ENCOUNTER — Other Ambulatory Visit: Payer: Self-pay

## 2023-12-25 MED ORDER — DOFETILIDE 250 MCG PO CAPS: 250.0000 ug | ORAL_CAPSULE | Freq: Two times a day (BID) | ORAL | 3 refills | Status: DC

## 2024-02-18 ENCOUNTER — Ambulatory Visit: Attending: Cardiology | Admitting: Cardiology

## 2024-02-18 ENCOUNTER — Encounter: Payer: Self-pay | Admitting: Cardiology

## 2024-02-18 VITALS — BP 138/64 | HR 60 | Ht 72.0 in | Wt 190.4 lb

## 2024-02-18 DIAGNOSIS — E782 Mixed hyperlipidemia: Secondary | ICD-10-CM

## 2024-02-18 DIAGNOSIS — I4891 Unspecified atrial fibrillation: Secondary | ICD-10-CM | POA: Diagnosis not present

## 2024-02-18 DIAGNOSIS — I2510 Atherosclerotic heart disease of native coronary artery without angina pectoris: Secondary | ICD-10-CM | POA: Diagnosis not present

## 2024-02-18 DIAGNOSIS — D6869 Other thrombophilia: Secondary | ICD-10-CM

## 2024-02-18 NOTE — Patient Instructions (Signed)
 Medication Instructions:  Your physician recommends that you continue on your current medications as directed. Please refer to the Current Medication list given to you today.  *If you need a refill on your cardiac medications before your next appointment, please call your pharmacy*   Lab Work: None If you have labs (blood work) drawn today and your tests are completely normal, you will receive your results only by: MyChart Message (if you have MyChart) OR A paper copy in the mail If you have any lab test that is abnormal or we need to change your treatment, we will call you to review the results.   Testing/Procedures: None   Follow-Up: At [facility], you and your health needs are our priority.  As part of our continuing mission to provide you with exceptional heart care, we have created designated Provider Care Teams.  These Care Teams include your primary Cardiologist (physician) and Advanced Practice Providers (APPs -  Physician Assistants and Nurse Practitioners) who all work together to provide you with the care you need, when you need it.  We recommend signing up for the patient portal called "MyChart".  Sign up information is provided on this After Visit Summary.  MyChart is used to connect with patients for Virtual Visits (Telemedicine).  Patients are able to view lab/test results, encounter notes, upcoming appointments, etc.  Non-urgent messages can be sent to your provider as well.   To learn more about what you can do with MyChart, go to [email or web address].    Your next appointment:   6 month(s)  Provider:   You may see Dina Rich, MD or one of the following Advanced Practice Providers on your designated Care Team:   Randall An, PA-C  Jacolyn Reedy, [place]     Other Instructions

## 2024-02-18 NOTE — Progress Notes (Signed)
 Clinical Summary Mr. Vanwingerden is a 89 y.o.male seen today for follow up of the following medical problems.     1. Afib - appears diagnosed around 01/2017 during admission. Started on eliquis and lopressor 12.5mg  bid. Due to continued dyspnea attempts were made to get him back into NSR, including multiple cardioversions and starting flecanide. Had tolerated lopressor at the time but stopped taking due to HRs in 40s or 50s, had been on 12.5mg  bid at the time.    - was on flecanide previously, discontued after diagnosed with CAD  We tried lopressor 12.5mg  bid last visit but caused fatigue and he stopped taking. Heart rates checked multiple times a day, usually 50s-60s.        - followed by afib clinic/EP - started on dofetilide 09/2020 with DCCV at that time - recurrent afib noted at 10/2020 f/u. Repeat DCCV was planned 11/2020 but cancelled as pt was back in SR. - at last EP appt 12/3 plan was to continue dofetilide - 07/2022 monitor no afib  04/2023 7 day monitor: frequent PACs, 16 runs SVT longest 17.8 seconds. Rare PVCs, Afib burden <1% rates 70-103, longest episode 33 min Overall patient had a min HR of 36 bpm, max HR of 122 bpm, and avg HR of 55 bpm.   ER visit 07/2023 notieced HRs were low 100s at home, asymptomatic EKG showed afib rate 95    - no recent palpitations - no bleeding on eliquis.      2. CAD - admit 05/2017 with chest pain - 05/2017 cath as reported below, received a DES to LAD - 05/2017 echo LVEF 60-65%, no WMAs   - seen in ER 10/2019 with chest pain - trop neg x 1. EKG afib without acute ischemic changes. CXR no acute process    10/2019 nuclear stress no ischemia - no significant symptoms.      - Admit 07/2022 with unstable angina. 07/2022 cath: LM 25%, diatal LM/ostial LAD 70% borderline RFR, prox LAD 50%, ostial RCA 90%, mid RCA 50%, acute marginal 75%. DES to RCA    -as far as antianginals avoid beta blockers due to low HRs. On dofetilide would avoid  ranexa. Soft bp's at times but would try starting back his norvasc 2.5mg  daily. Uses sildenafil prn would avoid imdur.       - no recent chest pains.    3. Carotid stenosis - history of right CEA - 01/2018 carotid US RICA 1-39% and patent CEA, left 1-39%. Annual checks   - followed by vascular. Has appt next month with repeat US   01/2022 RICA 1-39%, LICA 1-39% - appt with vascular next month     5. Hyperlipidemia - intolerant to statin, has been on low dose pravastatin.  - Dr Rennis Golden had discussed repatha, however patient declinded due to concern about too low cholesterol - 05/2018 TC 195 HDL 39 TG 73 LDL 141   - taking pravastatin 40mg  MWF - has been resistant to repatha.     10/2019 TC 243 HDL 44 LDL 179. He was off statin at that time but has since restarted - most recent labs with pcp   07/2022 RC 201 TG 59 HDL 38 LDL 151   07/16/22 intolerant to pravastatin, started zetia.  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 Son  in law is Charisse Klinefelter. 2 sons and daughter are involved in family business     Past Medical History:  Diagnosis Date   Arthritis    neck    Atrial fibrillation (HCC)    a. new-onset in 01/2017. Started on Eliquis   CAD (coronary artery disease), native coronary artery 05/16/2017   LAD 95%>>0 w/ 2.5 x 16 mm Synergy DES, D2 40%, RCA 50%, EF 55-65%, LVEDP mod elevated   Calcification of coronary artery    a. mild, nonobstructive CAD by cath in 2009.   Cancer of skin of chest    Carotid artery occlusion    a. s/p R CEA in 2014   Colon polyps    Hyperlipidemia    takes Pravastatin every other day   Pneumonia ~ 01/2013     Allergies  Allergen Reactions   Lipitor [Atorvastatin Calcium] Other (See Comments)    Muscle pain   Vantin [Cefpodoxime] Rash     Current Outpatient Medications  Medication Sig Dispense  Refill   ALPHA LIPOIC ACID PO Take 250 mg by mouth daily.     apixaban (ELIQUIS) 5 MG TABS tablet TAKE (1) TABLET BY MOUTH TWICE DAILY. 60 tablet 11   Coenzyme Q10 (COQ10) 400 MG CAPS Take 800 mg by mouth in the morning and at bedtime.      dofetilide (TIKOSYN) 250 MCG capsule Take 1 capsule (250 mcg total) by mouth 2 (two) times daily. 180 capsule 3   ezetimibe (ZETIA) 10 MG tablet Take 1 tablet (10 mg total) by mouth daily. 90 tablet 3   Magnesium 500 MG TABS Take 500 mg by mouth daily.      memantine (NAMENDA) 10 MG tablet Take 10 mg by mouth 2 (two) times daily.     methocarbamol (ROBAXIN) 500 MG tablet Take 1 tablet (500 mg total) by mouth 2 (two) times daily. 20 tablet 0   Multiple Vitamin (MULTIVITAMIN WITH MINERALS) TABS tablet Take 1 tablet by mouth daily.     nitroGLYCERIN (NITROSTAT) 0.4 MG SL tablet Place 1 tablet (0.4 mg total) under the tongue every 5 (five) minutes as needed for chest pain. 25 tablet 1   pravastatin (PRAVACHOL) 20 MG tablet Take 1 tablet (20 mg total) by mouth every Monday, Wednesday, and Friday. 36 tablet 3   sildenafil (REVATIO) 20 MG tablet Take 2-3 tablets (40-60 mg total) by mouth as needed (sexual activity). Do not take in combination with nitro SL.     zinc gluconate 50 MG tablet Take 50 mg by mouth daily.     No current facility-administered medications for this visit.     Past Surgical History:  Procedure Laterality Date   CARDIAC CATHETERIZATION  06/09/1999   mild CAD (LAD, diagonal, RCA) - Dr. Langston Reusing   CARDIOVERSION N/A 03/25/2017   Procedure: CARDIOVERSION;  Surgeon: Chrystie Nose, MD;  Location: [facility] ENDOSCOPY;  Service: Cardiovascular;  Laterality: N/A;   CARDIOVERSION N/A 09/28/2020   Procedure: CARDIOVERSION;  Surgeon: Chilton Si, MD;  Location: [facility] ENDOSCOPY;  Service: Cardiovascular;  Laterality: N/A;   CAROTID DOPPLER  02/2012   60-79% RICA stenosis, 40-59% LICA stenosis (prior to carotid endarterectomy)   COLONOSCOPY     CORONARY  ATHERECTOMY N/A 07/09/2022   Procedure: CORONARY ATHERECTOMY;  Surgeon: Tonny Bollman, MD;  Location: [facility] INVASIVE CV LAB;  Service: Cardiovascular;  Laterality: N/A;   CORONARY PRESSURE/FFR STUDY N/A 07/09/2022   Procedure: INTRAVASCULAR PRESSURE WIRE/FFR STUDY;  Surgeon: Tonny Bollman, MD;  Location: [facility] INVASIVE  CV LAB;  Service: Cardiovascular;  Laterality: N/A;   CORONARY STENT INTERVENTION N/A 05/16/2017   Procedure: Coronary Stent Intervention;  Surgeon: Corky Crafts, MD;  Location: [facility] INVASIVE CV LAB;  Service: Cardiovascular;  Laterality: N/A;   CORONARY STENT INTERVENTION N/A 07/09/2022   Procedure: CORONARY STENT INTERVENTION;  Surgeon: Tonny Bollman, MD;  Location: [facility] INVASIVE CV LAB;  Service: Cardiovascular;  Laterality: N/A;   CORONARY ULTRASOUND/IVUS N/A 07/09/2022   Procedure: Intravascular Ultrasound/IVUS;  Surgeon: Tonny Bollman, MD;  Location: [facility] INVASIVE CV LAB;  Service: Cardiovascular;  Laterality: N/A;   ENDARTERECTOMY Right 11/10/2013   Procedure: ENDARTERECTOMY CAROTID;  Surgeon: Fransisco Hertz, MD;  Location: [facility] OR;  Service: Vascular;  Laterality: Right;   INGUINAL HERNIA REPAIR Left    LEFT HEART CATH AND CORONARY ANGIOGRAPHY N/A 05/16/2017   Procedure: Left Heart Cath and Coronary Angiography;  Surgeon: Corky Crafts, MD;  Location: [facility] INVASIVE CV LAB;  Service: Cardiovascular;  Laterality: N/A;   LEFT HEART CATH AND CORONARY ANGIOGRAPHY N/A 07/09/2022   Procedure: LEFT HEART CATH AND CORONARY ANGIOGRAPHY;  Surgeon: Tonny Bollman, MD;  Location: [facility] INVASIVE CV LAB;  Service: Cardiovascular;  Laterality: N/A;   MOLE REMOVAL  1990s   "chest"   NM MYOCAR PERF WALL MOTION  04/2011   bruce myoview - perfusion defect in inferior myocardium (diaphragmatic attenuation), remaining myocardium with normal perfusion, EF 67%   PATCH ANGIOPLASTY Right 11/10/2013   Procedure: PATCH ANGIOPLASTY;  Surgeon: Fransisco Hertz, MD;  Location: [facility] OR;  Service: Vascular;  Laterality:  Right;   PILONIDAL CYST DRAINAGE     SHOULDER OPEN ROTATOR CUFF REPAIR Bilateral 1998 - 2001   SKIN CANCER EXCISION  ~ 2015   "chest"   TEMPORARY PACEMAKER N/A 07/09/2022   Procedure: TEMPORARY PACEMAKER;  Surgeon: Tonny Bollman, MD;  Location: [facility] INVASIVE CV LAB;  Service: Cardiovascular;  Laterality: N/A;   TONSILLECTOMY       Allergies  Allergen Reactions   Lipitor [Atorvastatin Calcium] Other (See Comments)    Muscle pain   Vantin [Cefpodoxime] Rash      Family History  Problem Relation Age of Onset   Stroke Father    Hypertension Mother    Prostate cancer Son    Stroke Brother    Cancer Brother        spine     Social History Mr. Mcbain reports that he quit smoking about 51 years ago. His smoking use included cigarettes. He started smoking about 69 years ago. He has a 18 pack-year smoking history. He has never used smokeless tobacco. Mr. Askren reports current alcohol use of about 14.0 standard drinks of alcohol per week.   Physical Examination Vitals:   02/18/24 1254 02/18/24 1316  BP: (!) 158/80 138/64  Pulse:    SpO2:     Filed Weights   02/18/24 1246  Weight: 190 lb 6.4 oz (86.4 kg)    Gen: resting comfortably, no acute distress HEENT: no scleral icterus, pupils equal round and reactive, no palptable cervical adenopathy,  CV: RRR, 2/6 systolic murmur rusb Resp: Clear to auscultation bilaterally GI: abdomen is soft, non-tender, non-distended, normal bowel sounds, no hepatosplenomegaly MSK: extremities are warm, no edema.  Skin: warm, no rash Neuro:  no focal deficits Psych: appropriate affect   Diagnostic Studies  05/2017 cath   Mid RCA lesion, 50 %stenosed. Ost RCA lesion, 25 %stenosed. Lesion noted in small RV marginal. LM lesion, 25 %stenosed. 2nd Diag lesion, 40 %stenosed. Mid LAD-1 lesion,  20 %stenosed. Mid LAD-2 lesion, 95 %stenosed. A STENT SYNERGY DES 2.5X16 drug eluting stent was successfully placed, postdilated to 3.0 mm. Post  intervention, there is a 0% residual stenosis. The left ventricular systolic function is normal. LV end diastolic pressure is moderately elevated. The left ventricular ejection fraction is 55-65% by visual estimate. There is no aortic valve stenosis.   LAD was the culprit for his unstable angina. He had symptoms during the procedure at rest. These resolved after stent placement. Watch overnight. Continue IV Angiomax for another hour.   Loaded with Plavix today. We'll start 75 mg daily tomorrow. Given aspirin today as well. Would plan for aspirin, Plavix and Eliquis for 30 days. After 30 days, would stop the aspirin and continue Plavix and Eliquis. A synergy drug-eluting stent was used since he is on long-term anticoagulation. If there is a bleeding complication, his antiplatelet therapy could be stopped sooner than usual. Ideally, he would get 6-12 months of Plavix depending on his bleeding risk profile.   Possible discharge tomorrow if he does well tonight and with cardiac rehabilitation tomorrow.     05/2017 echo Study Conclusions   - Left ventricle: The cavity size was normal. Wall thickness was   increased in a pattern of mild LVH. Systolic function was normal.   The estimated ejection fraction was in the range of 60% to 65%.   Wall motion was normal; there were no regional wall motion   abnormalities. The study is not technically sufficient to allow   evaluation of LV diastolic function. - Aortic valve: Mildly calcified annulus. Trileaflet; moderately   thickened leaflets. Valve area (VTI): 2.98 cm^2. Valve area   (Vmax): 2.7 cm^2. Valve area (Vmean): 2.56 cm^2. - Left atrium: The atrium was mildly dilated. - Technically adequate study.     10/2019 nuclear stress Unable to achieve adequate heart rate response with GXT, Lexiscan utilized. No diagnostic ST segment changes. Small, mild intensity, inferior/inferoseptal defect extending from apex to base that is fixed (actually more  prominent at rest) and consistent with soft tissue attenuation. No reversible defects to indicate ischemia. This is a low risk study. Nuclear stress EF: 69%.     07/2022 monitor 7 day monitor   Frequent isolated PACs. Rare couplets and triplets. Rare runs of SVT longest 7  beats   Rare ventricular ectopy in the form of isolated PVCs   Min HR 32, Max HR 80, avg HR 54. Sinus brady to low 30s at times in early and late afternoon. Nocturnal pauses up to 3.2 seconds.   No patient reported symptoms     04/2023 7 day monitor   Frequent supraventricular ectopy in the form of isolated PACs (15.9%), rare couplets and triplets. 16 runs of SVT longest 17.8 seconds.   Rare ventricular ectopy in the form of PVCs   No symptoms reported   Afib episodes <1% burden, rates 70-103 avg 88. Longest episode 33 minutes     Assessment and Plan  1. Afib/acquired thrombophilia - on dofetilide, some evidence of conduction disease would avoid av nodal agents - recent monitor low burden of afib <1%, noted in afib during recent ER visit with controlled rates, EKG today back in SR - overall dofetilide limiting recurrences, his recurrences are asymptomatic and rate controlled - denies any recent symptoms, continue current meds includnig eliquis for stroke prevention   2. CAD - no symptoms, continue current meds   3. HLD - limited tolerance to statins, on pravastatin just 20mg  MWF. On zetia.  He has turned down repatha. Has not tolerate any higher dosing of statin - continue current meds      Antoine Poche, M.D.

## 2024-02-21 ENCOUNTER — Other Ambulatory Visit: Payer: Self-pay

## 2024-02-21 DIAGNOSIS — I6523 Occlusion and stenosis of bilateral carotid arteries: Secondary | ICD-10-CM

## 2024-03-03 ENCOUNTER — Ambulatory Visit: Payer: Medicare HMO | Admitting: Physician Assistant

## 2024-03-03 ENCOUNTER — Ambulatory Visit (HOSPITAL_COMMUNITY)
Admission: RE | Admit: 2024-03-03 | Discharge: 2024-03-03 | Disposition: A | Discharge status: Home / Self Care | Payer: Medicare HMO | Source: Ambulatory Visit | Attending: Vascular Surgery | Admitting: Vascular Surgery

## 2024-03-03 VITALS — BP 155/65 | HR 63 | Temp 98.0°F | Resp 18 | Ht 72.0 in | Wt 193.2 lb

## 2024-03-03 DIAGNOSIS — I6523 Occlusion and stenosis of bilateral carotid arteries: Secondary | ICD-10-CM

## 2024-03-03 NOTE — Progress Notes (Signed)
 Office Note   History of Present Illness   Sean Miranda is a 89 y.o. (1930/12/02) male who presents for surveillance of carotid artery stenosis.  He has a history of right carotid endarterectomy on 11/10/2013 by Dr. Imogene Burn.  This was done for asymptomatic critical right ICA stenosis.  The patient returns today for follow up. He denies any recent strokelike symptoms such as slurred speech, facial droop, sudden visual changes, or sudden weakness/numbness.  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 He goes to the office every morning and works for a few hours.   Current Outpatient Medications  Medication Sig Dispense Refill   ALPHA LIPOIC ACID PO Take 250 mg by mouth daily.     apixaban (ELIQUIS) 5 MG TABS tablet TAKE (1) TABLET BY MOUTH TWICE DAILY. 60 tablet 11   Coenzyme Q10 (COQ10) 400 MG CAPS Take 800 mg by mouth in the morning and at bedtime.      dofetilide (TIKOSYN) 250 MCG capsule Take 1 capsule (250 mcg total) by mouth 2 (two) times daily. 180 capsule 3   ezetimibe (ZETIA) 10 MG tablet Take 1 tablet (10 mg total) by mouth daily. 90 tablet 3   Magnesium 500 MG TABS Take 500 mg by mouth daily.      memantine (NAMENDA) 10 MG tablet Take 10 mg by mouth 2 (two) times daily.     methocarbamol (ROBAXIN) 500 MG tablet Take 1 tablet (500 mg total) by mouth 2 (two) times daily. 20 tablet 0   Multiple Vitamin (MULTIVITAMIN WITH MINERALS) TABS tablet Take 1 tablet by mouth daily.     nitroGLYCERIN (NITROSTAT) 0.4 MG SL tablet Place 1 tablet (0.4 mg total) under the tongue every 5 (five) minutes as needed for chest pain. 25 tablet 1   pravastatin (PRAVACHOL) 20 MG tablet Take 1 tablet (20 mg total) by mouth every Monday, Wednesday, and Friday. 36 tablet 3   sildenafil (REVATIO) 20 MG tablet Take 2-3 tablets (40-60 mg total) by mouth as needed (sexual activity). Do not take in combination with nitro SL.     zinc gluconate 50 MG tablet Take 50 mg by mouth daily.     No current  facility-administered medications for this visit.    REVIEW OF SYSTEMS (negative unless checked):   Cardiac:  []  Chest pain or chest pressure? []  Shortness of breath upon activity? []  Shortness of breath when lying flat? []  Irregular heart rhythm?  Vascular:  []  Pain in calf, thigh, or hip brought on by walking? []  Pain in feet at night that wakes you up from your sleep? []  Blood clot in your veins? []  Leg swelling?  Pulmonary:  []  Oxygen at home? []  Productive cough? []  Wheezing?  Neurologic:  []  Sudden weakness in arms or legs? []  Sudden numbness in arms or legs? []  Sudden onset of difficult speaking or slurred speech? []  Temporary loss of vision in one eye? []  Problems with dizziness?  Gastrointestinal:  []  Blood in stool? []  Vomited blood?  Genitourinary:  []  Burning when urinating? []  Blood in urine?  Psychiatric:  []  Major depression  Hematologic:  []  Bleeding problems? []  Problems with blood clotting?  Dermatologic:  []  Rashes or ulcers?  Constitutional:  []  Fever or chills?  Ear/Nose/Throat:  []  Change in hearing? []  Nose bleeds? []  Sore throat?  Musculoskeletal:  []  Back pain? []  Joint pain? []  Muscle pain?   Physical Examination  There were no vitals filed for this visit.  There is no height or weight on file to calculate BMI.  General:  WDWN in NAD; vital signs documented above Gait: Not observed HENT: WNL, normocephalic Pulmonary: normal non-labored breathing , without rales, rhonchi,  wheezing Cardiac: regular Abdomen: soft, NT, no masses Skin: without rashes Vascular Exam/Pulses: palpable radial pulses bilaterally Extremities: without ischemic changes, without gangrene , without cellulitis; without open wounds;  Musculoskeletal: no muscle wasting or atrophy  Neurologic: A&O X 3;  No focal weakness or paresthesias are detected Psychiatric:  The pt has Normal affect.  Non-Invasive Vascular Imaging   Bilateral Carotid Duplex  (03/03/2024):  R ICA stenosis:  1-39% R VA:  patent and antegrade L ICA stenosis:  1-39% L VA:  patent and antegrade   Medical Decision Making   Sean Miranda is a 89 y.o. male who presents for surveillance of carotid artery stenosis  Based on the patient's vascular studies, his carotid artery stenosis is unchanged bilaterally at 1 to 39% He denies any strokelike symptoms such as slurred speech, facial droop, sudden visual changes, or sudden weakness/numbness. He is moving all of his extremities equally on exam.  He has palpable and equal radial pulses bilaterally He can repeat follow-up with our office in 2 years with carotid duplex   Loel Dubonnet PA-C [facility] Office: [phone]  Clinic MD: Lenell Antu

## 2024-04-14 ENCOUNTER — Other Ambulatory Visit: Payer: Self-pay | Admitting: Internal Medicine

## 2024-04-14 DIAGNOSIS — I4891 Unspecified atrial fibrillation: Secondary | ICD-10-CM

## 2024-04-14 NOTE — Telephone Encounter (Signed)
 Prescription refill request for Eliquis  received. Indication:Afib  Last office visit: 02/18/24 ([place])  Scr: 1.06 (08/01/23)  Age: 89 Weight: 87.6kg  Appropriate dose. Refill sent.

## 2024-05-26 ENCOUNTER — Encounter (HOSPITAL_COMMUNITY): Payer: Self-pay

## 2024-05-26 ENCOUNTER — Other Ambulatory Visit: Payer: Self-pay

## 2024-05-26 ENCOUNTER — Emergency Department (HOSPITAL_COMMUNITY)
Admission: EM | Admit: 2024-05-26 | Discharge: 2024-05-26 | Disposition: A | Discharge status: Home / Self Care | Attending: Emergency Medicine | Admitting: Emergency Medicine

## 2024-05-26 DIAGNOSIS — I959 Hypotension, unspecified: Secondary | ICD-10-CM | POA: Insufficient documentation

## 2024-05-26 DIAGNOSIS — E861 Hypovolemia: Secondary | ICD-10-CM | POA: Insufficient documentation

## 2024-05-26 DIAGNOSIS — R42 Dizziness and giddiness: Secondary | ICD-10-CM | POA: Diagnosis present

## 2024-05-26 DIAGNOSIS — Z7901 Long term (current) use of anticoagulants: Secondary | ICD-10-CM | POA: Insufficient documentation

## 2024-05-26 DIAGNOSIS — E860 Dehydration: Secondary | ICD-10-CM | POA: Insufficient documentation

## 2024-05-26 LAB — CBC WITH DIFFERENTIAL/PLATELET
Abs Immature Granulocytes: 0.01 10*3/uL (ref 0.00–0.07)
Basophils Absolute: 0 10*3/uL (ref 0.0–0.1)
Basophils Relative: 1 %
Eosinophils Absolute: 0.3 10*3/uL (ref 0.0–0.5)
Eosinophils Relative: 6 %
HCT: 39.8 % (ref 39.0–52.0)
Hemoglobin: 13.9 g/dL (ref 13.0–17.0)
Immature Granulocytes: 0 %
Lymphocytes Relative: 28 %
Lymphs Abs: 1.5 10*3/uL (ref 0.7–4.0)
MCH: 31.9 pg (ref 26.0–34.0)
MCHC: 34.9 g/dL (ref 30.0–36.0)
MCV: 91.3 fL (ref 80.0–100.0)
Monocytes Absolute: 0.5 10*3/uL (ref 0.1–1.0)
Monocytes Relative: 9 %
Neutro Abs: 2.9 10*3/uL (ref 1.7–7.7)
Neutrophils Relative %: 56 %
Platelets: 218 10*3/uL (ref 150–400)
RBC: 4.36 MIL/uL (ref 4.22–5.81)
RDW: 13.1 % (ref 11.5–15.5)
WBC: 5.2 10*3/uL (ref 4.0–10.5)
nRBC: 0 % (ref 0.0–0.2)

## 2024-05-26 LAB — COMPREHENSIVE METABOLIC PANEL WITH GFR
ALT: 14 U/L (ref 0–44)
AST: 19 U/L (ref 15–41)
Albumin: 3.6 g/dL (ref 3.5–5.0)
Alkaline Phosphatase: 57 U/L (ref 38–126)
Anion gap: 8 (ref 5–15)
BUN: 13 mg/dL (ref 8–23)
CO2: 24 mmol/L (ref 22–32)
Calcium: 8.8 mg/dL — ABNORMAL LOW (ref 8.9–10.3)
Chloride: 102 mmol/L (ref 98–111)
Creatinine, Ser: 1 mg/dL (ref 0.61–1.24)
GFR, Estimated: >60 mL/min (ref >60)
Glucose, Bld: 100 mg/dL — ABNORMAL HIGH (ref 70–99)
Potassium: 4.1 mmol/L (ref 3.5–5.1)
Sodium: 134 mmol/L — ABNORMAL LOW (ref 135–145)
Total Bilirubin: 1 mg/dL (ref 0.0–1.2)
Total Protein: 6.9 g/dL (ref 6.5–8.1)

## 2024-05-26 LAB — MAGNESIUM: Magnesium: 2.4 mg/dL (ref 1.7–2.4)

## 2024-05-26 MED ORDER — SODIUM CHLORIDE 0.9 % IV BOLUS: 1000.0000 mL | Freq: Once | INTRAVENOUS | Status: DC

## 2024-05-26 NOTE — ED Notes (Signed)
 Verbal order to d/c fluids and pt can drink of po water then retake ortho statics. Pt given 4 cups of 240ml of water at bedside.

## 2024-05-26 NOTE — Discharge Instructions (Signed)
 Follow-up with your cardiologist as scheduled

## 2024-05-26 NOTE — ED Triage Notes (Signed)
 Pt arrived via POV from home c/o hypotension this morning. Pt reports Systolic BP was in the 80's. Pt presents in NAD.

## 2024-05-26 NOTE — ED Notes (Signed)
 Pt allowed to drink per EDP.

## 2024-05-26 NOTE — ED Provider Notes (Signed)
 [place] EMERGENCY DEPARTMENT AT [facility] Provider Note   CSN: [id number] Arrival date & time: 05/26/24  9090     Patient presents with: Hypotension   Sean [facility] is a 89 y.o. male.  {Add pertinent medical, surgical, social history, OB history to YEP:67052} Patient states his blood pressure was low today and he was feeling little dizzy   Weakness      Prior to Admission medications   Medication Sig Start Date End Date Taking? Authorizing Provider  ALPHA LIPOIC ACID PO Take 250 mg by mouth daily.    [provider]  apixaban  (ELIQUIS ) 5 MG TABS tablet TAKE (1) TABLET BY MOUTH TWICE DAILY. 04/14/24   Alvan Dorn FALCON, MD  Coenzyme Q10 (COQ10) 400 MG CAPS Take 800 mg by mouth in the morning and at bedtime.     [provider]  dofetilide  (TIKOSYN ) 250 MCG capsule Take 1 capsule (250 mcg total) by mouth 2 (two) times daily. 12/25/23   Alvan Dorn FALCON, MD  ezetimibe  (ZETIA ) 10 MG tablet Take 1 tablet (10 mg total) by mouth daily. 07/16/22   Lucien Orren SAILOR, PA-C  Magnesium  500 MG TABS Take 500 mg by mouth daily.     [provider]  memantine (NAMENDA) 10 MG tablet Take 10 mg by mouth 2 (two) times daily. 02/11/24   [provider]  methocarbamol  (ROBAXIN ) 500 MG tablet Take 1 tablet (500 mg total) by mouth 2 (two) times daily. 10/28/22   Idol, Julie, PA-C  Multiple Vitamin (MULTIVITAMIN WITH MINERALS) TABS tablet Take 1 tablet by mouth daily.    [provider]  nitroGLYCERIN  (NITROSTAT ) 0.4 MG SL tablet Place 1 tablet (0.4 mg total) under the tongue every 5 (five) minutes as needed for chest pain. 07/11/22   Alvan Dorn FALCON, MD  pravastatin  (PRAVACHOL ) 20 MG tablet Take 1 tablet (20 mg total) by mouth every Monday, Wednesday, and Friday. 11/22/23 08/28/24  Alvan Dorn FALCON, MD  sildenafil  (REVATIO ) 20 MG tablet Take 2-3 tablets (40-60 mg total) by mouth as needed (sexual activity). Do not take in combination with nitro  SL. 07/10/22   Duke, Jon Garre, PA  zinc gluconate 50 MG tablet Take 50 mg by mouth daily.    [provider]    Allergies: Lipitor [atorvastatin calcium] and Vantin  [cefpodoxime ]    Review of Systems  Neurological:  Positive for weakness.    Updated Vital Signs BP (!) 162/80   Pulse 83   Temp (!) 97.4 F (36.3 C) (Oral)   Resp 19   Ht 6' (1.829 m)   Wt 83 kg   SpO2 100%   BMI 24.82 kg/m   Physical Exam  (all labs ordered are listed, but only abnormal results are displayed) Labs Reviewed  COMPREHENSIVE METABOLIC PANEL WITH GFR - Abnormal; Notable for the following components:      Result Value   Sodium 134 (*)    Glucose, Bld 100 (*)    Calcium 8.8 (*)    All other components within normal limits  CBC WITH DIFFERENTIAL/PLATELET  MAGNESIUM     EKG: None  Radiology: No results found.  {Document cardiac monitor, telemetry assessment procedure when appropriate:32947} Procedures   Medications Ordered in the ED  sodium chloride  0.9 % bolus 1,000 mL (1,000 mLs Intravenous Patient Refused/Not Given 05/26/24 1137)      {Click here for ABCD2, HEART and other calculators REFRESH Note before signing:1}  Medical Decision Making Amount and/or Complexity of Data Reviewed Labs: ordered. ECG/medicine tests: ordered.   Dehydration.  Patient has been hydrated with fluids and will follow-up with his cardiologist  {Document critical care time when appropriate  Document review of labs and clinical decision tools ie CHADS2VASC2, etc  Document your independent review of radiology images and any outside records  Document your discussion with family members, caretakers and with consultants  Document social determinants of health affecting pt's care  Document your decision making why or why not admission, treatments were needed:32947:::1}   Final diagnoses:  Hypotension due to hypovolemia  Dehydration    ED Discharge Orders     None

## 2024-05-26 NOTE — ED Notes (Signed)
 Pt called out and stated that they have finished their liter of fluid po.

## 2024-06-26 ENCOUNTER — Encounter: Payer: Self-pay | Admitting: Student

## 2024-06-26 ENCOUNTER — Ambulatory Visit: Attending: Student | Admitting: Student

## 2024-06-26 NOTE — Progress Notes (Deleted)
 Cardiology Office Note    Date:  06/26/2024  ID:  Se[facility], DOB June 25, 1930, MRN [id number] Cardiologist: Alvan Carrier, MD { :  History of Present Illness:    Sean [facility] is a 89 y.o. male with past medical history of paroxysmal atrial fibrillation, CAD (s/p DES to LAD in 05/2017, no ischemia by NST in 10/2019, cath in 07/2022 with DES to RCA), carotid artery stenosis (prior R CEA), HTN and HLD who presents to the office today for follow-up for recent emergency department visit.  He was examined by Dr. Alvan in 02/2024 and denied any recent chest pain or palpitations at that time.  He had not been on a beta-blocker due to bradycardia and was not on Ranexa given the use of Tikosyn .  Was also not on Imdur  given the use of sildenafil  as needed.  Statin therapy was also limited as he had been intolerant to higher intensity statins and was on pravastatin  20 mg Monday Wednesday Friday along with Zetia .  Had previously not been interested in injectable options.  Was continued on pravastatin  and Zetia  along with Eliquis  5 mg twice daily and Tikosyn  250 mcg twice daily.  In the interim, he presented to [facility] on 05/26/2024 for evaluation of dizziness.  Reported being hypotensive when BP of been checked at home with SBP in the 80s.  Labs showed Na+ 134, K+ 4.1 and creatinine 1.00.  Hemoglobin 13.9 and platelets 218 K.  EKG showed atrial fibrillation, heart rate 86 with known RBBB.  Presentation was felt to be consistent with dehydration and he received fluids and was discharged home.  - Monitor?  ROS: ***  Studies Reviewed:   EKG: EKG is*** ordered today and demonstrates ***   EKG Interpretation Date/Time:    Ventricular Rate:    PR Interval:    QRS Duration:    QT Interval:    QTC Calculation:   R Axis:      Text Interpretation:         Cardiac Catheterizatoion: 07/2022   LM lesion is 25% stenosed.   Mid LAD-2 lesion is 20% stenosed.   Mid RCA lesion is 50%  stenosed.   2nd Diag lesion is 40% stenosed.   Acute Mrg lesion is 75% stenosed.   Dist LM to Ost LAD lesion is 70% stenosed.   Prox LAD lesion is 50% stenosed.   Ost RCA lesion is 90% stenosed.   A drug-eluting stent was successfully placed using a SYNERGY XD 4.0X16.   Post intervention, there is a 0% residual stenosis.   1.  Moderately severe distal left main stenosis with borderline RFR evaluation 2.  Moderate diffuse proximal LAD stenosis with abnormal RFR in the distal vessel and borderline RFR in the mid vessel (mid vessel RFR 0.89 accounting for both the left main and proximal LAD stenosis) 3.  Patent left circumflex with mild diffuse nonobstructive stenosis 4.  Severe calcific ostial RCA stenosis treated successfully with orbital atherectomy and IVUS guided stenting using a 4.0 x 16 mm Synergy DES   Recommendations: Aspirin  81 mg daily x 30 days, clopidogrel  75 mg daily times minimum of 6 months, resume apixaban  tomorrow morning.  Would not continue triple therapy more than 30 days.  Would treat prophylactically with PPI inhibition while on triple therapy (order for Protonix  written).  Echocardiogram: 07/2022 IMPRESSIONS     1. Left ventricular ejection fraction, by estimation, is 55 to 60%. The  left ventricle has normal function. The left ventricle  has no regional  wall motion abnormalities. Left ventricular diastolic parameters are  consistent with Grade II diastolic  dysfunction (pseudonormalization). Elevated left atrial pressure.   2. Right ventricular systolic function is normal. The right ventricular  size is normal.   3. Left atrial size was mildly dilated.   4. The mitral valve is normal in structure. No evidence of mitral valve  regurgitation.   5. The aortic valve was not well visualized. There is mild calcification  of the aortic valve. Aortic valve regurgitation is not visualized. No  aortic stenosis is present.   6. The inferior vena cava is dilated in size  with <50% respiratory  variability, suggesting right atrial pressure of 15 mmHg.   Comparison(s): No significant change from prior study. Prior images  reviewed side by side.   Event Monitor: 05/2023   7 day monitor   Frequent supraventricular ectopy in the form of isolated PACs (15.9%), rare couplets and triplets. 16 runs of SVT longest 17.8 seconds.   Rare ventricular ectopy in the form of PVCs   No symptoms reported   Afib episodes <1% burden, rates 70-103 avg 88. Longest episode 33 minutes  Carotid Dopplers: 03/2024 Summary:  Right Carotid: Velocities in the right ICA are consistent with a 1-39%  stenosis.   Left Carotid: Velocities in the left ICA are consistent with a 1-39%  stenosis.   Risk Assessment/Calculations:   {Does this patient have ATRIAL FIBRILLATION?:[phone]} No BP recorded.  {Refresh Note OR Click here to enter BP  :1}***         Physical Exam:   VS:  There were no vitals taken for this visit.   Wt Readings from Last 3 Encounters:  05/26/24 183 lb (83 kg)  03/03/24 193 lb 3.2 oz (87.6 kg)  02/18/24 190 lb 6.4 oz (86.4 kg)     GEN: Well nourished, well developed in no acute distress NECK: No JVD; No carotid bruits CARDIAC: ***RRR, no murmurs, rubs, gallops RESPIRATORY:  Clear to auscultation without rales, wheezing or rhonchi  ABDOMEN: Appears non-distended. No obvious abdominal masses. EXTREMITIES: No clubbing or cyanosis. No edema.  Distal pedal pulses are 2+ bilaterally.   Assessment and Plan:      {Are you ordering a CV Procedure (e.g. stress test, cath, DCCV, TEE, etc)?   Press F2        :[id number]}   Signed, Laymon CHRISTELLA Qua, PA-C

## 2024-09-28 ENCOUNTER — Other Ambulatory Visit: Payer: Self-pay | Admitting: *Deleted

## 2024-09-28 DIAGNOSIS — I4891 Unspecified atrial fibrillation: Secondary | ICD-10-CM

## 2024-09-28 MED ORDER — APIXABAN 5 MG PO TABS
5.0000 mg | ORAL_TABLET | Freq: Two times a day (BID) | ORAL | 5 refills | Status: AC
Start: 2024-09-28 — End: ?

## 2024-09-28 NOTE — Telephone Encounter (Signed)
 Eliquis  5mg  refill request received. Patient is 89 years old, weight-83kg, Crea-1.0 on 05/26/24, Diagnosis-Afib, and last seen by Dr. Alvan on 02/18/24. Dose is appropriate based on dosing criteria. Will send in refill to requested pharmacy.

## 2024-10-12 ENCOUNTER — Telehealth: Payer: Self-pay | Admitting: Cardiology

## 2024-10-12 NOTE — Telephone Encounter (Signed)
 Daughter Delsie) stated patient wants a provider switch from Dr. Alvan in [place] to Dr. Floretta in [place].

## 2024-10-19 ENCOUNTER — Other Ambulatory Visit: Payer: Self-pay

## 2024-10-20 NOTE — Progress Notes (Signed)
 Cardiology Office Note:   Date:  10/20/2024  ID:  BEUFORD Miranda, DOB Sep 02, 1930, MRN [id number] PCP: Sheryle Carwin, MD  [facility] Providers Cardiologist:  Georganna Archer, MD { Chief Complaint:  Chief Complaint  Patient presents with   Hyperlipidemia      History of Present Illness:   Sean Miranda is a 89 y.o. male with a PMH of PAF s/p multiple DCCVs (on Eliquis ), CAD s/p PCI to LAD (2018) and to RCA (2023), CAS s/p R CEA, HLD who presents for follow up.  The patient transition care to me because he moved to [facility] to an assisted living facility.  The patient states that 1 month ago he had an episode of chest pain that awoke him from sleep.  He says that it was left-sided, mild, nonradiating, it went away after he took some aspirin .  The next morning the chest pain recurred, he took aspirin  again, and it went away.  Since that isolated episode he has not had any additional chest pain.  He remains physically active with exercise at his assisted living facility without limitation.  He denies SOB, PND, orthopnea, swelling, syncope or presyncope.  He is taking all of his medications as prescribed.  No further complaints.   Past Medical History:  Diagnosis Date   Arthritis    neck    Atrial fibrillation (HCC)    a. new-onset in 01/2017. Started on Eliquis    CAD (coronary artery disease), native coronary artery 05/16/2017   LAD 95%>>0 w/ 2.5 x 16 mm Synergy DES, D2 40%, RCA 50%, EF 55-65%, LVEDP mod elevated   Calcification of coronary artery    a. mild, nonobstructive CAD by cath in 2009.   Cancer of skin of chest    Carotid artery occlusion    a. s/p R CEA in 2014   Colon polyps    Hyperlipidemia    takes Pravastatin  every other day   Pneumonia ~ 01/2013     Studies Reviewed:    EKG: No new ECG       Cardiac Studies & Procedures   ______________________________________________________________________________________________ CARDIAC  CATHETERIZATION  CARDIAC CATHETERIZATION 07/09/2022  Conclusion   LM lesion is 25% stenosed.   Mid LAD-2 lesion is 20% stenosed.   Mid RCA lesion is 50% stenosed.   2nd Diag lesion is 40% stenosed.   Acute Mrg lesion is 75% stenosed.   Dist LM to Ost LAD lesion is 70% stenosed.   Prox LAD lesion is 50% stenosed.   Ost RCA lesion is 90% stenosed.   A drug-eluting stent was successfully placed using a SYNERGY XD 4.0X16.   Post intervention, there is a 0% residual stenosis.  1.  Moderately severe distal left main stenosis with borderline RFR evaluation 2.  Moderate diffuse proximal LAD stenosis with abnormal RFR in the distal vessel and borderline RFR in the mid vessel (mid vessel RFR 0.89 accounting for both the left main and proximal LAD stenosis) 3.  Patent left circumflex with mild diffuse nonobstructive stenosis 4.  Severe calcific ostial RCA stenosis treated successfully with orbital atherectomy and IVUS guided stenting using a 4.0 x 16 mm Synergy DES  Recommendations: Aspirin  81 mg daily x 30 days, clopidogrel  75 mg daily times minimum of 6 months, resume apixaban  tomorrow morning.  Would not continue triple therapy more than 30 days.  Would treat prophylactically with PPI inhibition while on triple therapy (order for Protonix  written).  Findings Coronary Findings Diagnostic  Dominance: Right  Left Main  LM lesion is 25% stenosed. Dist LM to Ost LAD lesion is 70% stenosed. The lesion is severely calcified. RFR 0.96 just beyond the lesion, RFR 0.84 in the distal LAD  Left Anterior Descending Prox LAD lesion is 50% stenosed. Non-stenotic Mid LAD-1 lesion was previously treated. Vessel is the culprit lesion. Mid LAD-2 lesion is 20% stenosed.  Second Diagonal Branch 2nd Diag lesion is 40% stenosed.  Ramus Intermedius Vessel is small.  Right Coronary Artery Ost RCA lesion is 90% stenosed. The lesion is calcified. Mid RCA lesion is 50% stenosed. The lesion is  eccentric.  Acute Marginal Branch Acute Mrg lesion is 75% stenosed.  Intervention  Ost RCA lesion Atherectomy CATH VISTA GUIDE [id number] JR4 guide catheter was inserted. WIRE VIPERWIRE COR FLEX .012 guidewire was used to cross lesion. Orbital atherectomy was performed. 5 passes taken. Stent CATH VISTA GUIDE 6FR JR4 guide catheter was inserted. Lesion crossed with guidewire using a WIRE COUGAR XT STRL 190CM. Pre-stent angioplasty was performed using a BALL SAPPHIRE NC24 3.5X15. A drug-eluting stent was successfully placed using a SYNERGY XD 4.0X16. Post-stent angioplasty was performed using a BALL SAPPHIRE NC24 4.5X10. Post-Intervention Lesion Assessment The intervention was successful. Pre-interventional TIMI flow is 3. Post-intervention TIMI flow is 3. No complications occurred at this lesion. There is a 0% residual stenosis post intervention.   CARDIAC CATHETERIZATION  CARDIAC CATHETERIZATION 05/16/2017  Conclusion  Mid RCA lesion, 50 %stenosed.  Ost RCA lesion, 25 %stenosed. Lesion noted in small RV marginal.  LM lesion, 25 %stenosed.  2nd Diag lesion, 40 %stenosed.  Mid LAD-1 lesion, 20 %stenosed.  Mid LAD-2 lesion, 95 %stenosed.  A STENT SYNERGY DES 2.5X16 drug eluting stent was successfully placed, postdilated to 3.0 mm.  Post intervention, there is a 0% residual stenosis.  The left ventricular systolic function is normal.  LV end diastolic pressure is moderately elevated.  The left ventricular ejection fraction is 55-65% by visual estimate.  There is no aortic valve stenosis.  LAD was the culprit for his unstable angina. He had symptoms during the procedure at rest. These resolved after stent placement. Watch overnight. Continue IV Angiomax  for another hour.  Loaded with Plavix  today. We'll start 75 mg daily tomorrow. Given aspirin  today as well. Would plan for aspirin , Plavix  and Eliquis  for 30 days. After 30 days, would stop the aspirin  and continue Plavix  and  Eliquis . A synergy drug-eluting stent was used since he is on long-term anticoagulation. If there is a bleeding complication, his antiplatelet therapy could be stopped sooner than usual. Ideally, he would get 6-12 months of Plavix  depending on his bleeding risk profile.  Possible discharge tomorrow if he does well tonight and with cardiac rehabilitation tomorrow.  Findings Coronary Findings Diagnostic  Dominance: Right  Left Main  Left Anterior Descending  Culprit lesion.  Second Diagonal Branch  Ramus Intermedius Vessel is small.  Right Coronary Artery  The lesion is eccentric.  Acute Marginal Branch  Intervention  Mid LAD-2 lesion Angioplasty Lesion crossed with guidewire using a WIRE ASAHI PROWATER 180CM. Pre-stent angioplasty was performed using a BALLOON EMERGE MR 2.5X15. A STENT SYNERGY DES 2.5X16 drug eluting stent was successfully placed. Stent strut is well apposed. Post-stent angioplasty was performed using a BALLOON Cuartelez EUPHORA RX3.0X12. The pre-interventional distal flow is normal (TIMI 3).  The post-interventional distal flow is normal (TIMI 3). The intervention was successful . No complications occurred at this lesion. There is a 0% residual stenosis post intervention.   STRESS TESTS  NM MYOCAR MULTI W/SPECT  W 10/26/2019  Narrative  Unable to achieve adequate heart rate response with GXT, Lexiscan  utilized. No diagnostic ST segment changes.  Small, mild intensity, inferior/inferoseptal defect extending from apex to base that is fixed (actually more prominent at rest) and consistent with soft tissue attenuation. No reversible defects to indicate ischemia.  This is a low risk study.  Nuclear stress EF: 69%.   ECHOCARDIOGRAM  ECHOCARDIOGRAM COMPLETE 07/10/2022  Narrative ECHOCARDIOGRAM REPORT    Patient Name:   PACEY ALTIZER Carelli Date of Exam: 07/10/2022 Medical Rec #:  [id number]       Height:       72.0 in Accession #:    [id number]      Weight:        184.7 lb Date of Birth:  Dec 03, 1930       BSA:          2.060 m Patient Age:    89 years        BP:           149/67 mmHg Patient Gender: M               HR:           57 bpm. Exam Location:  Inpatient  Procedure: 2D Echo, Cardiac Doppler and Color Doppler  Indications:    Acute ischemic heart disease  History:        Patient has prior history of Echocardiogram examinations, most recent 09/05/2020. CAD; Risk Factors:Dyslipidemia.  Sonographer:    Elida Casey Referring Phys: [phone] MICHAEL COOPER  IMPRESSIONS   1. Left ventricular ejection fraction, by estimation, is 55 to 60%. The left ventricle has normal function. The left ventricle has no regional wall motion abnormalities. Left ventricular diastolic parameters are consistent with Grade II diastolic dysfunction (pseudonormalization). Elevated left atrial pressure. 2. Right ventricular systolic function is normal. The right ventricular size is normal. 3. Left atrial size was mildly dilated. 4. The mitral valve is normal in structure. No evidence of mitral valve regurgitation. 5. The aortic valve was not well visualized. There is mild calcification of the aortic valve. Aortic valve regurgitation is not visualized. No aortic stenosis is present. 6. The inferior vena cava is dilated in size with <50% respiratory variability, suggesting right atrial pressure of 15 mmHg.  Comparison(s): No significant change from prior study. Prior images reviewed side by side.  FINDINGS Left Ventricle: Left ventricular ejection fraction, by estimation, is 55 to 60%. The left ventricle has normal function. The left ventricle has no regional wall motion abnormalities. The left ventricular internal cavity size was normal in size. There is no left ventricular hypertrophy. Left ventricular diastolic parameters are consistent with Grade II diastolic dysfunction (pseudonormalization). Elevated left atrial pressure.  Right Ventricle: The right ventricular  size is normal. No increase in right ventricular wall thickness. Right ventricular systolic function is normal.  Left Atrium: Left atrial size was mildly dilated.  Right Atrium: Right atrial size was normal in size.  Pericardium: There is no evidence of pericardial effusion.  Mitral Valve: The mitral valve is normal in structure. No evidence of mitral valve regurgitation.  Tricuspid Valve: The tricuspid valve is normal in structure. Tricuspid valve regurgitation is not demonstrated.  Aortic Valve: The aortic valve was not well visualized. There is mild calcification of the aortic valve. Aortic valve regurgitation is not visualized. No aortic stenosis is present. Aortic valve peak gradient measures 9.3 mmHg.  Pulmonic Valve: The pulmonic valve was grossly normal. Pulmonic valve regurgitation is  not visualized.  Aorta: The aortic root and ascending aorta are structurally normal, with no evidence of dilitation.  Venous: The inferior vena cava is dilated in size with less than 50% respiratory variability, suggesting right atrial pressure of 15 mmHg.  IAS/Shunts: No atrial level shunt detected by color flow Doppler.   LEFT VENTRICLE PLAX 2D LVIDd:         5.40 cm   Diastology LVIDs:         3.80 cm   LV e' medial:    4.80 cm/s LV PW:         1.10 cm   LV E/e' medial:  20.9 LV IVS:        1.10 cm   LV e' lateral:   7.20 cm/s LVOT diam:     2.00 cm   LV E/e' lateral: 13.9 LV SV:         95 LV SV Index:   46 LVOT Area:     3.14 cm   IVC IVC diam: 2.80 cm  LEFT ATRIUM             Index        RIGHT ATRIUM           Index LA diam:        3.80 cm 1.84 cm/m   RA Area:     17.00 cm LA Vol (A2C):   46.9 ml 22.77 ml/m  RA Volume:   46.70 ml  22.67 ml/m LA Vol (A4C):   55.5 ml 26.95 ml/m LA Biplane Vol: 53.6 ml 26.02 ml/m AORTIC VALVE                 PULMONIC VALVE AV Area (Vmax): 2.62 cm     PV Vmax:       0.81 m/s AV Vmax:        152.50 cm/s  PV Peak grad:  2.6 mmHg AV Peak  Grad:   9.3 mmHg LVOT Vmax:      127.00 cm/s LVOT Vmean:     70.400 cm/s LVOT VTI:       0.301 m  AORTA Ao Root diam: 3.50 cm Ao Asc diam:  3.70 cm  MITRAL VALVE MV Area (PHT): 3.75 cm     SHUNTS MV Decel Time: 202 msec     Systemic VTI:  0.30 m MV E velocity: 100.40 cm/s  Systemic Diam: 2.00 cm MV A velocity: 75.50 cm/s MV E/A ratio:  1.33  Mihai Croitoru MD Electronically signed by Jerel Balding MD Signature Date/Time: 07/10/2022/12:53:31 PM    Final    MONITORS  LONG TERM MONITOR (3-14 DAYS) 05/02/2023  Narrative   7 day monitor   Frequent supraventricular ectopy in the form of isolated PACs (15.9%), rare couplets and triplets. 16 runs of SVT longest 17.8 seconds.   Rare ventricular ectopy in the form of PVCs   No symptoms reported   Afib episodes <1% burden, rates 70-103 avg 88. Longest episode 33 minutes   Patch Wear Time:  6 days and 20 hours (2024-05-16T11:14:50-0400 to 2024-05-23T07:54:30-0400)  Patient had a min HR of 36 bpm, max HR of 122 bpm, and avg HR of 55 bpm. Predominant underlying rhythm was Sinus Rhythm. First Degree AV Block was present. Bundle Branch Block/IVCD was present. 16 Supraventricular Tachycardia runs occurred, the run with the fastest interval lasting 5 beats with a max rate of 122 bpm, the longest lasting 17.8 secs with an avg rate of 98 bpm. Atrial Fibrillation occurred (<1%  burden), ranging from 70-103 bpm (avg of 88 bpm), the longest lasting 33 mins 27 secs with an avg rate of 88 bpm. Atrial Fibrillation was present at activation of device. Isolated SVEs were frequent (15.9%, [id number]), SVE Couplets were rare (<1.0%, 1706), and SVE Triplets were rare (<1.0%, 72). Isolated VEs were rare (<1.0%), and no VE Couplets or VE Triplets were present.       ______________________________________________________________________________________________      Risk Assessment/Calculations:    CHA2DS2-VASc Score = 4   This indicates a 4.8% annual  risk of stroke. The patient's score is based upon: CHF History: 0 HTN History: 1 Diabetes History: 0 Stroke History: 0 Vascular Disease History: 1 Age Score: 2 Gender Score: 0             Physical Exam:     VS:  BP 127/69 (BP Location: Left Arm, Patient Position: Sitting)   Pulse (!) 57   Ht 6' (1.829 m)   Wt 192 lb 3.2 oz (87.2 kg)   SpO2 98%   BMI 26.07 kg/m      Wt Readings from Last 3 Encounters:  05/26/24 183 lb (83 kg)  03/03/24 193 lb 3.2 oz (87.6 kg)  02/18/24 190 lb 6.4 oz (86.4 kg)     GEN: Well nourished, well developed, in no acute distress NECK: No JVD; No carotid bruits CARDIAC: RRR, II/VI systolic murmur at the RUSB, no rubs or gallops RESPIRATORY:  Clear to auscultation without rales, wheezing or rhonchi  ABDOMEN: Soft, non-tender, non-distended, normal bowel sounds EXTREMITIES:  Warm and well perfused, no edema; No deformity, 2+ radial pulses PSYCH: Normal mood and affect   Assessment & Plan Coronary artery disease involving native coronary artery of native heart with other form of angina pectoris - Patient has nonobstructive CAD and had an episode of chest pain 1 month ago. -No additional chest pain since this isolated episode. -Is unclear to me if the patient's chest pain was cardiac in etiology or something else. -Since the patient is higher risk, I gave him 2 options: 1) we can do watchful waiting and the patient notify me if his symptoms recur or 2) we perform a high fidelity stress test to see if he has new ischemia. -After shared decision making, the patient and I elected to watchfully wait and if his symptoms recur we will do a stress test. Continue to monitor for now for recurrence of chest pain. Continue Eliquis  monotherapy Continue sublingual nitroglycerin  Follow-up in 3 months Mixed hyperlipidemia - Largely intolerant to statins and does not want to do a PCSK9 inhibitor - His last LDL was entirely too high though. -He has never tried  bempedoic acid and is willing to try. Start bempedoic acid 180 mg daily Repeat lipid panel in 3 months  Longstanding persistent atrial fibrillation (HCC) - Sounds like he is in normal sinus rhythm today. - No changes. Continue Eliquis  Continue dofetilide  Bilateral carotid artery stenosis - Carotid ultrasounds in April were WNL. Continue vascular surgery follow-up Murmur - Systolic murmur heard on physical exam which sounds like aortic stenosis. - Will get an echocardiogram to evaluate. Complete echo          This note was written with the assistance of a dictation microphone or AI dictation software. Please excuse any typos or grammatical errors.   Signed, Georganna Archer, MD 10/20/2024 8:55 PM    [facility]

## 2024-10-20 NOTE — Assessment & Plan Note (Signed)
-   Largely intolerant to statins and does not want to do a PCSK9 inhibitor Bempedoic acid

## 2024-10-20 NOTE — Assessment & Plan Note (Signed)
-   Carotid ultrasounds in April were WNL. Continue vascular surgery follow-up

## 2024-10-21 ENCOUNTER — Encounter: Payer: Self-pay | Admitting: Student in an Organized Health Care Education/Training Program

## 2024-10-21 ENCOUNTER — Other Ambulatory Visit (HOSPITAL_COMMUNITY): Payer: Self-pay

## 2024-10-21 ENCOUNTER — Ambulatory Visit
Attending: Student in an Organized Health Care Education/Training Program | Admitting: Student in an Organized Health Care Education/Training Program

## 2024-10-21 ENCOUNTER — Other Ambulatory Visit: Payer: Self-pay

## 2024-10-21 VITALS — BP 127/69 | HR 57 | Ht 72.0 in | Wt 192.2 lb

## 2024-10-21 DIAGNOSIS — I2510 Atherosclerotic heart disease of native coronary artery without angina pectoris: Secondary | ICD-10-CM

## 2024-10-21 DIAGNOSIS — I25118 Atherosclerotic heart disease of native coronary artery with other forms of angina pectoris: Secondary | ICD-10-CM | POA: Diagnosis not present

## 2024-10-21 DIAGNOSIS — I4811 Longstanding persistent atrial fibrillation: Secondary | ICD-10-CM

## 2024-10-21 DIAGNOSIS — E782 Mixed hyperlipidemia: Secondary | ICD-10-CM

## 2024-10-21 DIAGNOSIS — I6523 Occlusion and stenosis of bilateral carotid arteries: Secondary | ICD-10-CM | POA: Diagnosis not present

## 2024-10-21 DIAGNOSIS — R011 Cardiac murmur, unspecified: Secondary | ICD-10-CM

## 2024-10-21 MED ORDER — BEMPEDOIC ACID 180 MG PO TABS
180.0000 mg | ORAL_TABLET | Freq: Every day | ORAL | 3 refills | Status: AC
  Filled 2024-10-21: qty 90, 90d supply, fill #0

## 2024-10-21 NOTE — Assessment & Plan Note (Signed)
-   Sounds like he is in normal sinus rhythm today. - No changes. Continue Eliquis  Continue dofetilide

## 2024-10-21 NOTE — Patient Instructions (Signed)
 Medication Instructions:  START BEMPEDOIC ACID  180 MG DAILY  *If you need a refill on your cardiac medications before your next appointment, please call your pharmacy*  Lab Work: LIPID PANEL IN 3 MONTHS   If you have labs (blood work) drawn today and your tests are completely normal, you will receive your results only by: MyChart Message (if you have MyChart) OR A paper copy in the mail If you have any lab test that is abnormal or we need to change your treatment, we will call you to review the results.  Testing/Procedures: ECHOCARDIOGRAM  Your physician has requested that you have an echocardiogram. Echocardiography is a painless test that uses sound waves to create images of your heart. It provides your doctor with information about the size and shape of your heart and how well your heart's chambers and valves are working. This procedure takes approximately one hour. There are no restrictions for this procedure. Please do NOT wear cologne, perfume, aftershave, or lotions (deodorant is allowed). Please arrive 15 minutes prior to your appointment time.  Please note: We ask at that you not bring children with you during ultrasound (echo/ vascular) testing. Due to room size and safety concerns, children are not allowed in the ultrasound rooms during exams. Our front office staff cannot provide observation of children in our lobby area while testing is being conducted. An adult accompanying a patient to their appointment will only be allowed in the ultrasound room at the discretion of the ultrasound technician under special circumstances. We apologize for any inconvenience.   Follow-Up: At [facility], you and your health needs are our priority.  As part of our continuing mission to provide you with exceptional heart care, our providers are all part of one team.  This team includes your primary Cardiologist (physician) and Advanced Practice Providers or APPs (Physician Assistants and  Nurse Practitioners) who all work together to provide you with the care you need, when you need it.  Your next appointment:   3 month(s)  Provider:   Georganna Archer, MD

## 2024-10-22 MED ORDER — PRAVASTATIN SODIUM 20 MG PO TABS: 20.0000 mg | ORAL_TABLET | ORAL | 3 refills | Status: AC

## 2024-12-01 ENCOUNTER — Ambulatory Visit (HOSPITAL_COMMUNITY)

## 2024-12-15 ENCOUNTER — Other Ambulatory Visit: Payer: Self-pay | Admitting: Cardiology

## 2024-12-24 ENCOUNTER — Ambulatory Visit (HOSPITAL_COMMUNITY)
Admission: RE | Admit: 2024-12-24 | Discharge: 2024-12-24 | Disposition: A | Discharge status: Home / Self Care | Source: Ambulatory Visit | Attending: Cardiology | Admitting: Cardiology

## 2024-12-24 DIAGNOSIS — R011 Cardiac murmur, unspecified: Secondary | ICD-10-CM | POA: Diagnosis present

## 2024-12-24 LAB — ECHOCARDIOGRAM COMPLETE
Area-P 1/2: 3.65 cm2
S' Lateral: 3.2 cm

## 2024-12-25 ENCOUNTER — Ambulatory Visit: Payer: Self-pay | Admitting: Student in an Organized Health Care Education/Training Program

## 2025-01-05 ENCOUNTER — Ambulatory Visit: Payer: Self-pay

## 2025-01-05 NOTE — Telephone Encounter (Signed)
" °  FYI Only or Action Required?: Action required by provider: request for appointment and update on patient condition.  Patient was last seen in primary care on .  Called Nurse Triage reporting Low BP.  Symptoms began today.  Interventions attempted: Nothing.  Symptoms are: stable.Had pt. Check BP while on phone and it was 120/70 pulse 71. States he felt a little dizzy earlier in the day, but fine now. Daughter, Marval said she notified [facility] where he lives and a nurse will be checking on him today.Instructed pt. To call as needed. Will call PCP as needed.  Triage Disposition: See PCP When Office is Open (Within 3 Days)  Patient/caregiver understands and will follow disposition?: Yes      Reason for Disposition  [1] Fall in systolic BP > 20 mm Hg from normal AND [2] NOT feeling weak or lightheaded  Answer Assessment - Initial Assessment Questions 1. BLOOD PRESSURE: What is your blood pressure? Did you take at least two measurements 5 minutes apart?     89/74 2. ONSET: When did you take your blood pressure?     TODAY 3. HOW: How did you take your blood pressure? (e.g., visiting nurse, automatic home BP monitor)     HOME CUFF 4. HISTORY: Do you have a history of low blood pressure? What is your blood pressure normally?     NO 5. MEDICINES: Are you taking any medicines for blood pressure? If Yes, ask: Have they been changed recently?     NO 6. PULSE RATE: Do you know what your pulse rate is?      NO 7. OTHER SYMPTOMS: Have you been sick recently? Have you had a recent injury?     NO 8. PREGNANCY: Is there any chance you are pregnant? When was your last menstrual period?     N/A  Protocols used: Blood Pressure - Low-A-AH  "

## 2025-01-12 ENCOUNTER — Ambulatory Visit: Admitting: Student in an Organized Health Care Education/Training Program
